# Patient Record
Sex: Female | Born: 1940 | Race: White | Hispanic: No | Marital: Married | Smoking: Never smoker
Health system: Southern US, Community
[De-identification: ages and names within clinical notes are randomized; demographics above are authoritative.]

## PROBLEM LIST (undated history)

## (undated) DIAGNOSIS — Z1371 Encounter for nonprocreative screening for genetic disease carrier status: Secondary | ICD-10-CM

## (undated) DIAGNOSIS — E7800 Pure hypercholesterolemia, unspecified: Secondary | ICD-10-CM

## (undated) DIAGNOSIS — I10 Essential (primary) hypertension: Secondary | ICD-10-CM

## (undated) DIAGNOSIS — I83893 Varicose veins of bilateral lower extremities with other complications: Secondary | ICD-10-CM

## (undated) DIAGNOSIS — T884XXA Failed or difficult intubation, initial encounter: Secondary | ICD-10-CM

## (undated) DIAGNOSIS — K449 Diaphragmatic hernia without obstruction or gangrene: Secondary | ICD-10-CM

## (undated) DIAGNOSIS — C50919 Malignant neoplasm of unspecified site of unspecified female breast: Secondary | ICD-10-CM

## (undated) DIAGNOSIS — F419 Anxiety disorder, unspecified: Secondary | ICD-10-CM

## (undated) DIAGNOSIS — R011 Cardiac murmur, unspecified: Secondary | ICD-10-CM

## (undated) DIAGNOSIS — D649 Anemia, unspecified: Secondary | ICD-10-CM

## (undated) DIAGNOSIS — Z923 Personal history of irradiation: Secondary | ICD-10-CM

## (undated) DIAGNOSIS — H269 Unspecified cataract: Secondary | ICD-10-CM

## (undated) DIAGNOSIS — D0511 Intraductal carcinoma in situ of right breast: Secondary | ICD-10-CM

## (undated) DIAGNOSIS — L719 Rosacea, unspecified: Secondary | ICD-10-CM

## (undated) DIAGNOSIS — K219 Gastro-esophageal reflux disease without esophagitis: Secondary | ICD-10-CM

## (undated) DIAGNOSIS — M1990 Unspecified osteoarthritis, unspecified site: Secondary | ICD-10-CM

## (undated) HISTORY — PX: CHOLECYSTECTOMY: SHX55

## (undated) HISTORY — DX: Pure hypercholesterolemia, unspecified: E78.00

## (undated) HISTORY — DX: Essential (primary) hypertension: I10

## (undated) HISTORY — DX: Unspecified osteoarthritis, unspecified site: M19.90

## (undated) HISTORY — PX: BLEPHAROPLASTY: SUR158

## (undated) HISTORY — PX: ABDOMINAL HYSTERECTOMY: SHX81

## (undated) HISTORY — DX: Anemia, unspecified: D64.9

## (undated) HISTORY — DX: Varicose veins of bilateral lower extremities with other complications: I83.893

## (undated) HISTORY — DX: Rosacea, unspecified: L71.9

## (undated) HISTORY — DX: Cardiac murmur, unspecified: R01.1

## (undated) HISTORY — PX: HERNIA REPAIR: SHX51

## (undated) HISTORY — PX: OTHER SURGICAL HISTORY: SHX169

## (undated) HISTORY — DX: Diaphragmatic hernia without obstruction or gangrene: K44.9

## (undated) HISTORY — DX: Unspecified cataract: H26.9

## (undated) HISTORY — PX: CATARACT EXTRACTION W/ INTRAOCULAR LENS  IMPLANT, BILATERAL: SHX1307

## (undated) HISTORY — DX: Anxiety disorder, unspecified: F41.9

## (undated) HISTORY — DX: Gastro-esophageal reflux disease without esophagitis: K21.9

## (undated) HISTORY — PX: BREAST LUMPECTOMY: SHX2

---

## 1999-09-20 DIAGNOSIS — R011 Cardiac murmur, unspecified: Secondary | ICD-10-CM

## 1999-09-20 HISTORY — DX: Cardiac murmur, unspecified: R01.1

## 2000-09-19 DIAGNOSIS — Z923 Personal history of irradiation: Secondary | ICD-10-CM

## 2000-09-19 DIAGNOSIS — C50919 Malignant neoplasm of unspecified site of unspecified female breast: Secondary | ICD-10-CM | POA: Insufficient documentation

## 2000-09-19 HISTORY — DX: Personal history of irradiation: Z92.3

## 2000-09-19 HISTORY — PX: BREAST LUMPECTOMY: SHX2

## 2000-09-19 HISTORY — PX: BREAST EXCISIONAL BIOPSY: SUR124

## 2000-09-19 HISTORY — PX: BREAST SURGERY: SHX581

## 2001-06-21 ENCOUNTER — Other Ambulatory Visit: Admission: RE | Admit: 2001-06-21 | Discharge: 2001-06-21 | Payer: Self-pay | Admitting: Family Medicine

## 2001-09-19 HISTORY — PX: BREAST CYST ASPIRATION: SHX578

## 2004-11-22 ENCOUNTER — Ambulatory Visit: Payer: Self-pay | Admitting: Oncology

## 2005-04-26 ENCOUNTER — Ambulatory Visit: Payer: Self-pay | Admitting: General Surgery

## 2005-06-06 ENCOUNTER — Ambulatory Visit: Payer: Self-pay | Admitting: Oncology

## 2005-11-23 ENCOUNTER — Ambulatory Visit: Payer: Self-pay | Admitting: Oncology

## 2006-04-20 ENCOUNTER — Ambulatory Visit: Payer: Self-pay | Admitting: General Surgery

## 2006-04-25 ENCOUNTER — Ambulatory Visit: Payer: Self-pay | Admitting: General Surgery

## 2006-06-05 ENCOUNTER — Ambulatory Visit: Payer: Self-pay | Admitting: Oncology

## 2006-09-19 HISTORY — PX: OTHER SURGICAL HISTORY: SHX169

## 2007-04-23 ENCOUNTER — Ambulatory Visit: Payer: Self-pay | Admitting: General Surgery

## 2008-04-23 ENCOUNTER — Ambulatory Visit: Payer: Self-pay | Admitting: General Surgery

## 2008-09-19 HISTORY — PX: OTHER SURGICAL HISTORY: SHX169

## 2009-04-27 ENCOUNTER — Ambulatory Visit: Payer: Self-pay | Admitting: General Surgery

## 2010-04-28 ENCOUNTER — Ambulatory Visit: Payer: Self-pay | Admitting: General Surgery

## 2011-05-25 ENCOUNTER — Ambulatory Visit: Payer: Self-pay | Admitting: General Surgery

## 2011-06-17 DIAGNOSIS — R197 Diarrhea, unspecified: Secondary | ICD-10-CM | POA: Insufficient documentation

## 2011-06-17 DIAGNOSIS — K30 Functional dyspepsia: Secondary | ICD-10-CM | POA: Insufficient documentation

## 2011-06-17 DIAGNOSIS — M109 Gout, unspecified: Secondary | ICD-10-CM | POA: Insufficient documentation

## 2011-06-17 DIAGNOSIS — R0989 Other specified symptoms and signs involving the circulatory and respiratory systems: Secondary | ICD-10-CM | POA: Insufficient documentation

## 2011-06-17 DIAGNOSIS — M1990 Unspecified osteoarthritis, unspecified site: Secondary | ICD-10-CM | POA: Insufficient documentation

## 2011-09-20 HISTORY — PX: COLONOSCOPY: SHX174

## 2012-04-05 ENCOUNTER — Ambulatory Visit: Payer: Self-pay | Admitting: Family Medicine

## 2012-04-24 DIAGNOSIS — R7303 Prediabetes: Secondary | ICD-10-CM | POA: Insufficient documentation

## 2012-06-11 ENCOUNTER — Ambulatory Visit: Payer: Self-pay | Admitting: General Surgery

## 2012-07-25 ENCOUNTER — Ambulatory Visit: Payer: Self-pay | Admitting: General Surgery

## 2012-09-19 HISTORY — PX: EYE SURGERY: SHX253

## 2012-10-23 ENCOUNTER — Ambulatory Visit: Payer: Self-pay | Admitting: Ophthalmology

## 2012-10-29 ENCOUNTER — Ambulatory Visit: Payer: Self-pay | Admitting: Ophthalmology

## 2013-02-14 ENCOUNTER — Encounter: Payer: Self-pay | Admitting: *Deleted

## 2013-06-19 ENCOUNTER — Ambulatory Visit: Payer: Self-pay | Admitting: General Surgery

## 2013-07-16 ENCOUNTER — Ambulatory Visit: Payer: Self-pay | Admitting: General Surgery

## 2013-07-25 ENCOUNTER — Ambulatory Visit (INDEPENDENT_AMBULATORY_CARE_PROVIDER_SITE_OTHER): Payer: Medicare Other | Admitting: General Surgery

## 2013-07-25 ENCOUNTER — Encounter: Payer: Self-pay | Admitting: General Surgery

## 2013-07-25 VITALS — BP 130/74 | HR 70 | Resp 14 | Ht 64.0 in | Wt 193.0 lb

## 2013-07-25 DIAGNOSIS — Z853 Personal history of malignant neoplasm of breast: Secondary | ICD-10-CM

## 2013-07-25 NOTE — Progress Notes (Signed)
Patient ID: Claire Kane, female   DOB: [birth year], 72 y.o.   MRN: [id number]  Chief Complaint  Patient presents with  . Follow-up    mammogram    HPI Claire Kane is a 72 y.o. female who presents for a breast evaluation. The most recent mammogram was done on 06/2013. Patient does perform regular self breast checks and gets regular mammograms done.    HPI  Past Medical History  Diagnosis Date  . Heart murmur 2001  . Arthritis   . 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 complications   . High cholesterol   . Rosacea     Past Surgical History  Procedure Laterality Date  . Stab pheblectomy   2010  . Vein closure procedure Bilateral 2008  . Abdominal hysterectomy    . Cholecystectomy    . Breast surgery Right 2002    lumpectomy  . Eye surgery Right 2014  . Colonoscopy  2013    History reviewed. No pertinent family history.  Social History History  Substance Use Topics  . Smoking status: Never Smoker   . Smokeless tobacco: Never Used  . Alcohol Use: Yes    No Known Allergies  Current Outpatient Prescriptions  Medication Sig Dispense Refill  . aspirin 81 MG tablet Take 81 mg by mouth daily.      Marland Kitchen atorvastatin (LIPITOR) 10 MG tablet Take 10 mg by mouth daily.      . calcium citrate (CALCITRATE - DOSED IN MG ELEMENTAL CALCIUM) 950 MG tablet Take 200 mg of elemental calcium by mouth daily.      Marland Kitchen ezetimibe (ZETIA) 10 MG tablet Take 10 mg by mouth daily.      Marland Kitchen glucosamine-chondroitin 500-400 MG tablet Take 1 tablet by mouth 3 (three) times daily.      . Multiple Vitamins-Minerals (MULTIVITAMIN WITH MINERALS) tablet Take 1 tablet by mouth daily.      . niacin 500 MG tablet Take 500 mg by mouth at bedtime.      . psyllium (REGULOID) 0.52 G capsule Take 0.52 g by mouth daily.      Lilian Kapur 625 MG tablet Take 1 tablet by mouth daily.       No current facility-administered medications for this visit.    Review of Systems Review of Systems  Constitutional:  Negative.   Respiratory: Negative.   Cardiovascular: Negative.     Blood pressure 130/74, pulse 70, resp. rate 14, height 5\' 4"  (1.626 m), weight 193 lb (87.544 kg).  Physical Exam Physical Exam  Constitutional: She is oriented to person, place, and time. She appears well-developed and well-nourished.  Eyes: Conjunctivae are normal. No scleral icterus.  Neck: No mass and no thyromegaly present.  Cardiovascular: Normal rate, normal heart sounds, intact distal pulses and normal pulses.   Pulses:      Dorsalis pedis pulses are 2+ on the right side, and 2+ on the left side.       Posterior tibial pulses are 2+ on the right side, and 2+ on the left side.  No edema No visible VV. Multi spider veins around the ankles. No skin changes.  Pulmonary/Chest: Breath sounds normal.  Abdominal: Soft. Normal appearance and bowel sounds are normal. There is no hepatomegaly. There is no tenderness. No hernia.  Lymphadenopathy:    She has no cervical adenopathy.    She has no axillary adenopathy.  Neurological: She is alert and oriented to person, place, and time. She has normal strength. No sensory deficit.  Skin: Skin is warm and dry.    Data Reviewed Mammogram reviewed    Assessment    Stable exam. 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  Also completed 5 years of of tamoxifen after     Plan    Patient to return in one year with bilateral screening mammogram        SANKAR,SEEPLAPUTHUR G 07/25/2013, 10:42 AM

## 2013-07-25 NOTE — Patient Instructions (Addendum)
Patient tio return in one year with bilateral screening mammogram

## 2013-08-06 ENCOUNTER — Encounter: Payer: Self-pay | Admitting: General Surgery

## 2013-09-05 DIAGNOSIS — E7801 Familial hypercholesterolemia: Secondary | ICD-10-CM | POA: Insufficient documentation

## 2013-09-05 DIAGNOSIS — R011 Cardiac murmur, unspecified: Secondary | ICD-10-CM | POA: Insufficient documentation

## 2013-09-05 DIAGNOSIS — E78019 Familial hypercholesterolemia, unspecified: Secondary | ICD-10-CM | POA: Insufficient documentation

## 2014-04-14 LAB — LIPID PANEL
Cholesterol: 179 mg/dL (ref 0–200)
HDL: 63 mg/dL (ref 35–70)
LDL CALC: 100 mg/dL
LDL/HDL RATIO: 1.6
TRIGLYCERIDES: 79 mg/dL (ref 40–160)

## 2014-04-14 LAB — BASIC METABOLIC PANEL: GLUCOSE: 98 mg/dL

## 2014-06-16 ENCOUNTER — Encounter: Payer: Self-pay | Admitting: General Surgery

## 2014-07-21 ENCOUNTER — Encounter: Payer: Self-pay | Admitting: General Surgery

## 2014-07-23 ENCOUNTER — Ambulatory Visit: Payer: Self-pay | Admitting: General Surgery

## 2014-07-23 ENCOUNTER — Encounter: Payer: Self-pay | Admitting: General Surgery

## 2014-07-30 ENCOUNTER — Encounter: Payer: Self-pay | Admitting: General Surgery

## 2014-07-30 ENCOUNTER — Ambulatory Visit (INDEPENDENT_AMBULATORY_CARE_PROVIDER_SITE_OTHER): Payer: Medicare Other | Admitting: General Surgery

## 2014-07-30 DIAGNOSIS — Z86000 Personal history of in-situ neoplasm of breast: Secondary | ICD-10-CM

## 2014-07-30 DIAGNOSIS — Z853 Personal history of malignant neoplasm of breast: Secondary | ICD-10-CM

## 2014-07-30 NOTE — Patient Instructions (Signed)
Patient to return in 1 year with bilateral screening mammogram. Continue self breast exams. Call office for any new breast issues or concerns.  

## 2014-07-30 NOTE — Progress Notes (Signed)
Patient ID: Claire Kane, female   DOB: Oct 05, 1940, 73 y.o.   MRN: [id number]  Chief Complaint  Patient presents with  . Follow-up    1 year mammogram    HPI Claire Kane is a 72 y.o. female who presents for a breast cancer follow up. The most recent mammogram was done on 07/23/14. Patient does perform regular self breast checks and gets regular mammograms done. The patient denies any new problems at this time.     HPI  Past Medical History  Diagnosis Date  . Heart murmur 2001  . Arthritis   . 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 complications   . High cholesterol   . Rosacea   . 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 Stab pheblectomy   2010  . Vein closure procedure Bilateral 2008  . Abdominal hysterectomy    . Cholecystectomy    . Breast surgery Right 2002    lumpectomy  . Eye surgery Right 2014  . Colonoscopy  2013    No family history on file.  Social History History  Substance Use Topics  . Smoking status: Never Smoker   . Smokeless tobacco: Never Used  . Alcohol Use: Yes    No Known Allergies  Current Outpatient Prescriptions  Medication Sig Dispense Refill  . aspirin 81 MG tablet Take 81 mg by mouth daily.    Marland Kitchen atorvastatin (LIPITOR) 10 MG tablet Take 10 mg by mouth daily.    . Azelaic Acid (FINACEA) 15 % cream Apply 1 application topically 2 (two) times daily.    . calcium citrate (CALCITRATE - DOSED IN MG ELEMENTAL CALCIUM) 950 MG tablet Take 200 mg of elemental calcium by mouth daily.    Marland Kitchen ezetimibe (ZETIA) 10 MG tablet Take 10 mg by mouth daily.    Marland Kitchen glucosamine-chondroitin 500-400 MG tablet Take 1 tablet by mouth 3 (three) times daily.    . Multiple Vitamins-Minerals (MULTIVITAMIN WITH MINERALS) tablet Take 1 tablet by mouth daily.    . niacin 500 MG tablet Take 500 mg by mouth at bedtime.    . psyllium (REGULOID) 0.52 G capsule Take 0.52 g by mouth daily.    [name] 625 MG tablet Take  1 tablet by mouth daily.     No current facility-administered medications for this visit.    Review of Systems Review of Systems  Constitutional: Negative.   Respiratory: Negative.   Cardiovascular: Negative.     Blood pressure 128/68, pulse 70, resp. rate 14, weight 180 lb (81.647 kg).  Physical Exam Physical Exam  Constitutional: She is oriented to person, place, and time. She appears well-developed and well-nourished.  Eyes: Conjunctivae are normal. No scleral icterus.  Neck: Neck supple. No thyromegaly present.  Cardiovascular: Normal rate, regular rhythm and normal heart sounds.   No murmur heard. Pulmonary/Chest: Effort normal and breath sounds normal. Right breast exhibits no inverted nipple, no mass, no nipple discharge, no skin change and no tenderness. Left breast exhibits no inverted nipple, no mass, no nipple discharge, no skin change and no tenderness.  Abdominal: Soft. Normal appearance and bowel sounds are normal. There is no hepatosplenomegaly. There is no tenderness. No hernia.  Lymphadenopathy:    She has no cervical adenopathy.    She has no axillary adenopathy.  Neurological: She is alert and oriented to person, place, and time.  Skin: Skin is warm and dry.    Data  Reviewed  Mammogram reviewed and stable.   Assessment    Stable exam. History of DCIS right breast.     Plan    Patient to return in 1 year with bilateral screening mammogram.        Criss Alvine F 07/30/2014, 9:22 AM

## 2014-09-16 ENCOUNTER — Ambulatory Visit: Payer: Self-pay | Admitting: Ophthalmology

## 2014-09-16 DIAGNOSIS — I499 Cardiac arrhythmia, unspecified: Secondary | ICD-10-CM

## 2014-09-29 ENCOUNTER — Ambulatory Visit: Payer: Self-pay | Admitting: Ophthalmology

## 2015-01-09 NOTE — Op Note (Signed)
PATIENT NAME:  Claire Kane, DICKE MR#:  [id number] DATE OF BIRTH:  August 22, 1941  DATE OF PROCEDURE:  10/29/2012  PREOPERATIVE DIAGNOSIS: Cataract, right eye.   POSTOPERATIVE DIAGNOSIS: Cataract, right eye.   PROCEDURE PERFORMED: Extracapsular cataract extraction using phacoemulsification with placement of Alcon SN6CWS, 23.5 diopter posterior chamber lens, serial number [id number]  SURGEON: Remo Lipps A. Argie Lober, M.D.   ANESTHESIA: 4% lidocaine and 0.75% Marcaine a 50-50 mixture with 10 units/mL of Hyalex added, given as a peribulbar.   ANESTHESIOLOGIST:  Dr. Benjamine Mola.   COMPLICATIONS: None.   ESTIMATED BLOOD LOSS: Less than 1 mL.   DESCRIPTION OF PROCEDURE:  The patient was brought to the operating room and given a peribulbar block.  The patient was then prepped and draped in the usual fashion.  The vertical rectus muscles were imbricated using 5-0 silk sutures.  These sutures were then clamped to the sterile drapes as bridle sutures.  A limbal peritomy was performed extending two clock hours and hemostasis was obtained with cautery.  A partial thickness scleral groove was made at the surgical limbus and dissected anteriorly in a lamellar dissection using an Alcon crescent knife.  The anterior chamber was entered superonasally with a Superblade and through the lamellar dissection with a 2.6 mm keratome.  DisCoVisc was used to replace the aqueous and a continuous tear capsulorrhexis was carried out.  Hydrodissection and hydrodelineation were carried out with balanced salt and a 27 gauge canula.  The nucleus was rotated to confirm the effectiveness of the hydrodissection.  Phacoemulsification was carried out using a divide-and-conquer technique.  Total ultrasound time was 1 minute and 46 seconds with an average power of 25.3 percent.  CDE 52.05.  Irrigation/aspiration was used to remove the residual cortex.  DisCoVisc was used to inflate the capsule and the internal incision was enlarged to 3 mm with  the crescent knife.  The intraocular lens was folded and inserted into the capsular bag using the AcrySert delivery system.  Irrigation/aspiration was used to remove the residual DisCoVisc.  Miostat was injected into the anterior chamber through the paracentesis track to inflate the anterior chamber and induce miosis.  A 0.10 mL of cefuroxime was injected into the anterior chamber via the paracentesis tract.   The wound was checked for leaks and none were found. The conjunctiva was closed with cautery and the bridle sutures were removed.  Two drops of 0.3% Vigamox were placed on the eye.   An eye shield was placed on the eye.  The patient was discharged to the recovery room in good condition.  ____________________________ Loura Back Amylee Lodato, MD sad:ct D: 10/29/2012 13:08:20 ET T: 10/29/2012 13:27:00 ET JOB#: [id number]  cc: Remo Lipps A. Suda Forbess, MD, <Dictator> Martie Lee MD ELECTRONICALLY SIGNED 11/19/2012 13:34

## 2015-01-18 NOTE — Op Note (Signed)
PATIENT NAME:  Claire Kane, Claire Kane MR#:  [id number] DATE OF BIRTH:  07/28/1941  DATE OF PROCEDURE:  09/29/2014  PREOPERATIVE DIAGNOSIS: Nuclear sclerotic cataract left eye.   POSTOPERATIVE DIAGNOSIS:  Nuclear sclerotic cataract left eye.   PROCEDURE PERFORMED: Extracapsular cataract extraction using phacoemulsification with placement of an Alcon SN6CWS, 23.5-diopter posterior chamber lens, serial number [id number].   ANESTHESIA: 4% lidocaine and 0.75% Marcaine in a 50/50 mixture with 10 units/mL of Hylenex added, given as a peribulbar.   ANESTHESIOLOGIST: Dr. Andree Elk.   COMPLICATIONS: None.   ESTIMATED BLOOD LOSS: Less than 1 mL.   DESCRIPTION OF PROCEDURE:  The patient was brought to the operating room and given a peribulbar block.  The patient was then prepped and draped in the usual fashion.  The vertical rectus muscles were imbricated using 5-0 silk sutures.  These sutures were then clamped to the sterile drapes as bridle sutures.  A limbal peritomy was performed extending two clock hours and hemostasis was obtained with cautery.  A partial thickness scleral groove was made at the surgical limbus and dissected anteriorly in a lamellar dissection using an Alcon crescent knife.  The anterior chamber was entered supero-temporally with a Superblade and through the lamellar dissection with a 2.6 mm keratome.  DisCoVisc was used to replace the aqueous and a continuous tear capsulorrhexis was carried out.  Hydrodissection and hydrodelineation were carried out with balanced salt and a 27 gauge canula.  The nucleus was rotated to confirm the effectiveness of the hydrodissection.  Phacoemulsification was carried out using a divide-and-conquer technique.  Total ultrasound time was 1 minute and 28 seconds with an average power of 26.4 percent. CDE of 40.37.  Irrigation/aspiration was used to remove the residual cortex.  DisCoVisc was used to inflate the capsule and the internal incision was enlarged to 3 mm  with the crescent knife.  The intraocular lens was folded and inserted into the capsular bag using the AcrySert delivery system.  Irrigation/aspiration was used to remove the residual DisCoVisc.  Miostat was injected into the anterior chamber through the paracentesis track to inflate the anterior chamber and induce miosis.  0.1 mL of cefuroxime containing 1 mg of drug was injected via the paracentesis track.  The wound was checked for leaks and none were found. The conjunctiva was closed with cautery and the bridle sutures were removed.  Two drops of 0.3% Vigamox were placed on the eye.   An eye shield was placed on the eye.  The patient was discharged to the recovery room in good condition.   ____________________________ Loura Back Tai Skelly, MD sad:bu D: 09/29/2014 13:26:50 ET T: 09/29/2014 14:15:26 ET JOB#: [id number]  cc: Remo Lipps A. Kuzey Ogata, MD, <Dictator> Martie Lee MD ELECTRONICALLY SIGNED 10/01/2014 16:21

## 2015-04-28 ENCOUNTER — Encounter: Payer: Self-pay | Admitting: Family Medicine

## 2015-05-28 ENCOUNTER — Encounter: Payer: Self-pay | Admitting: Family Medicine

## 2015-05-28 ENCOUNTER — Ambulatory Visit (INDEPENDENT_AMBULATORY_CARE_PROVIDER_SITE_OTHER): Payer: Medicare Other | Admitting: Family Medicine

## 2015-05-28 VITALS — BP 118/72 | HR 82 | Temp 98.2°F | Resp 14 | Ht 63.0 in | Wt 193.0 lb

## 2015-05-28 DIAGNOSIS — Z1211 Encounter for screening for malignant neoplasm of colon: Secondary | ICD-10-CM

## 2015-05-28 DIAGNOSIS — Z23 Encounter for immunization: Secondary | ICD-10-CM

## 2015-05-28 DIAGNOSIS — K219 Gastro-esophageal reflux disease without esophagitis: Secondary | ICD-10-CM

## 2015-05-28 DIAGNOSIS — E785 Hyperlipidemia, unspecified: Secondary | ICD-10-CM

## 2015-05-28 DIAGNOSIS — M546 Pain in thoracic spine: Secondary | ICD-10-CM | POA: Diagnosis not present

## 2015-05-28 DIAGNOSIS — Z0000 Encounter for general adult medical examination without abnormal findings: Secondary | ICD-10-CM | POA: Diagnosis not present

## 2015-05-28 LAB — IFOBT (OCCULT BLOOD): IFOBT: NEGATIVE

## 2015-05-28 MED ORDER — CARISOPRODOL 350 MG PO TABS: 350.0000 mg | ORAL_TABLET | Freq: Three times a day (TID) | ORAL | Status: DC | PRN

## 2015-05-28 MED ORDER — RANITIDINE HCL 150 MG PO TABS: 150.0000 mg | ORAL_TABLET | Freq: Two times a day (BID) | ORAL | Status: DC

## 2015-05-28 NOTE — Progress Notes (Signed)
Patient ID: Claire Kane, female   DOB: 1940/09/22, 74 y.o.   MRN: [id number]  Visit Date: 05/28/2015  Today's Provider: Wilhemena Durie, MD   Chief Complaint  Patient presents with  . Medicare Wellness   Subjective:   Claire Kane is a 74 y.o. female who presents today for her Subsequent Annual Wellness Visit. She feels fairly well. She reports exercising none. She reports she is sleeping fairly well.  LAST: Colonoscopy 07/25/2012 normal  Pap smear-with Dr. Kenton Kingfisher within past year or 2.  EKG-01/18/10  BMD-04/05/12 normal  Up to date with all immunizations at this time.  Mammogram follows Dr. Jamal Collin and is due in November 2016 again per patient.   Review of Systems  Constitutional: Negative.   HENT: Negative.   Eyes: Negative.   Respiratory: Negative.   Cardiovascular: Negative.   Gastrointestinal: Positive for constipation.  Endocrine: Negative.   Genitourinary: Positive for enuresis.  Musculoskeletal: Positive for myalgias and back pain.  Skin: Positive for rash.  Allergic/Immunologic: Negative.   Neurological: Negative.   Hematological: Bruises/bleeds easily.  Psychiatric/Behavioral: Negative.     Patient Active Problem List   Diagnosis Date Noted  . History of breast cancer, DCIS right breast, 2002 07/25/2013    Social History   Social History  . Marital Status: Married    Spouse Name: N/A  . Number of Children: N/A  . Years of Education: N/A   Occupational History  . Not on file.   Social History Main Topics  . Smoking status: Never Smoker   . Smokeless tobacco: Never Used  . Alcohol Use: No  . Drug Use: No  . Sexual Activity: No   Other Topics Concern  . Not on file   Social History Narrative    Past Surgical History  Procedure Laterality Date  . Stab pheblectomy   2010  . Vein closure procedure Bilateral 2008  . Abdominal hysterectomy    . Cholecystectomy    . Breast surgery Right 2002    lumpectomy  . Eye surgery Right 2014  .  Colonoscopy  2013    Her family history includes Cancer (age of onset: 3) in her cousin and other; Cancer (age of onset: 97) in her cousin; Cancer (age of onset: 68) in her maternal aunt; Heart attack in her brother, father, mother, and sister; Other in her brother; Stroke in her sister.    Outpatient Prescriptions Prior to Visit  Medication Sig Dispense Refill  . aspirin 81 MG tablet Take 81 mg by mouth daily.    Marland Kitchen atorvastatin (LIPITOR) 10 MG tablet Take 10 mg by mouth daily.    . calcium citrate (CALCITRATE - DOSED IN MG ELEMENTAL CALCIUM) 950 MG tablet Take 200 mg of elemental calcium by mouth daily.    Marland Kitchen ezetimibe (ZETIA) 10 MG tablet Take 10 mg by mouth daily.    Marland Kitchen glucosamine-chondroitin 500-400 MG tablet Take 1 tablet by mouth 3 (three) times daily.    . Multiple Vitamins-Minerals (MULTIVITAMIN WITH MINERALS) tablet Take 1 tablet by mouth daily.    . psyllium (REGULOID) 0.52 G capsule Take 0.52 g by mouth daily.    [name] 625 MG tablet Take 1 tablet by mouth daily.    . Azelaic Acid (FINACEA) 15 % cream Apply 1 application topically 2 (two) times daily.    . niacin 500 MG tablet Take 500 mg by mouth at bedtime.     No facility-administered medications prior to visit.    No Known Allergies  Patient Care Team: Jerrol Banana., MD as PCP - General (Family Medicine) Seeplaputhur Robinette Haines, MD (General Surgery)  Objective:   Vitals:  Filed Vitals:   05/28/15 1035  BP: 118/72  Pulse: 82  Temp: 98.2 F (36.8 C)  Resp: 14  Height: 5\' 3"  (1.6 m)  Weight: 193 lb (87.544 kg)    Physical Exam  Constitutional: She is oriented to person, place, and time. She appears well-developed and well-nourished.  HENT:  Head: Normocephalic and atraumatic.  Right Ear: External ear normal.  Left Ear: External ear normal.  Nose: Nose normal.  Mouth/Throat: Oropharynx is clear and moist.  Eyes: Conjunctivae are normal. Pupils are equal, round, and reactive to light.  Neck: Neck  supple.  Cardiovascular: Normal rate, regular rhythm, normal heart sounds and intact distal pulses.   Spider varicose veins lower legs, ankles, and feet.  Pulmonary/Chest: Effort normal and breath sounds normal.  Abdominal: Soft. Bowel sounds are normal.  Genitourinary: Guaiac negative stool.  Pelvic exam deferred to GYN. DRE normal  Musculoskeletal: Normal range of motion.  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  Neurological: She is alert and oriented to person, place, and time.  Skin: Skin is warm and dry.  Psychiatric: She has a normal mood and affect. Her behavior is normal. Judgment and thought content normal.    Activities of Daily Living In your present state of health, do you have any difficulty performing the following activities: 05/28/2015  Hearing? N  Vision? N  Difficulty concentrating or making decisions? Y  Walking or climbing stairs? Y  Dressing or bathing? N  Doing errands, shopping? N    Fall Risk Assessment Fall Risk  05/28/2015  Falls in the past year? Yes  Number falls in past yr: 1  Injury with Fall? No     Depression Screen PHQ 2/9 Scores 05/28/2015  PHQ - 2 Score 0    Cognitive Testing - 6-CIT    Year: 0 4 points  Month: 0 3 points  Memorize "Pia Mau, [address], [place]"  Time (within 1 hour:) 0 3 points  Count backwards from 20: 0 2 4 points  Name months of year: 0 2 4 points  Repeat Address: 0 2 4 6 8 10  points   Total Score: 0/28  Interpretation : Normal (0-7) Abnormal (8-28)    Assessment & Plan:     Annual Wellness Visit  Reviewed patient's Family Medical History Reviewed and updated list of patient's medical providers Assessment of cognitive impairment was done Assessed patient's functional ability Established a written schedule for health screening [facility] Completed and Reviewed    1. Need for influenza vaccination  - Flu vaccine HIGH DOSE PF (Fluzone High dose)  2. Bilateral thoracic  back pain New. Try Soma. Follow as needed. - carisoprodol (SOMA) 350 MG tablet; Take 1 tablet (350 mg total) by mouth 3 (three) times daily as needed for muscle spasms.  Dispense: 55 tablet; Refill: 1  3. 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 Darrick Huntsman.  5. GERD Try ranitidine 150 mg twice a day. Return to clinic 2 months to reassess.  6. Breast cancer/DCIS-right breast-2002 Followed by Dr. Jamal Collin.  7. Post menopausal Pap smear per Dr. Kenton Kingfisher 8. Cystocele Pessary per Dr. Kenton Kingfisher. 9. Hyperlipidemia Check labs. Followed by Dr. Arther Dames at [facility].    I have done the exam and reviewed the above chart and it  is accurate to the best of my knowledge.  Patient was seen and examined by Dr. Eulas Post and note was scribed by Theressa Millard, RMA.    Miguel Aschoff MD [facility] 05/28/2015 10:37 AM

## 2015-06-12 ENCOUNTER — Other Ambulatory Visit: Payer: Self-pay

## 2015-06-12 ENCOUNTER — Encounter: Payer: Self-pay | Admitting: Family Medicine

## 2015-06-12 DIAGNOSIS — Z1231 Encounter for screening mammogram for malignant neoplasm of breast: Secondary | ICD-10-CM

## 2015-07-27 ENCOUNTER — Other Ambulatory Visit: Payer: Self-pay | Admitting: General Surgery

## 2015-07-27 ENCOUNTER — Ambulatory Visit
Admission: RE | Admit: 2015-07-27 | Discharge: 2015-07-27 | Disposition: A | Discharge status: Home / Self Care | Payer: Medicare Other | Source: Ambulatory Visit | Attending: General Surgery | Admitting: General Surgery

## 2015-07-27 DIAGNOSIS — Z1231 Encounter for screening mammogram for malignant neoplasm of breast: Secondary | ICD-10-CM

## 2015-08-04 ENCOUNTER — Ambulatory Visit (INDEPENDENT_AMBULATORY_CARE_PROVIDER_SITE_OTHER): Payer: Medicare Other | Admitting: General Surgery

## 2015-08-04 ENCOUNTER — Encounter: Payer: Self-pay | Admitting: General Surgery

## 2015-08-04 VITALS — BP 124/72 | HR 90 | Resp 14 | Ht 65.0 in | Wt 194.0 lb

## 2015-08-04 DIAGNOSIS — Z853 Personal history of malignant neoplasm of breast: Secondary | ICD-10-CM | POA: Diagnosis not present

## 2015-08-04 DIAGNOSIS — Z1371 Encounter for nonprocreative screening for genetic disease carrier status: Secondary | ICD-10-CM

## 2015-08-04 DIAGNOSIS — Z86000 Personal history of in-situ neoplasm of breast: Secondary | ICD-10-CM

## 2015-08-04 HISTORY — DX: Encounter for nonprocreative screening for genetic disease carrier status: Z13.71

## 2015-08-04 NOTE — Progress Notes (Addendum)
Patient ID: Claire Kane, female   DOB: 01/28/1941, 74 y.o.   MRN: [id number]  Chief Complaint  Patient presents with  . Follow-up    mammogram    HPI Claire Kane is a 74 y.o. female.  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 The most recent mammogram was done on 07-27-15.  Patient does perform regular self breast checks and gets regular mammograms done.   I have reviewed the history of present illness with the patient.  HPI  Past Medical History  Diagnosis Date  . Heart murmur 2001  . Arthritis   . 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 complications   . High cholesterol   . Rosacea   . Cancer [facility]) 2002    DCIS right breast     Past Surgical History  Procedure Laterality Date  . Stab pheblectomy   2010  . Vein closure procedure Bilateral 2008  . Abdominal hysterectomy    . Cholecystectomy    . Breast surgery Right 2002    lumpectomy  . Eye surgery Right 2014  . Colonoscopy  2013  . Breast excisional biopsy Right     positive 2002  . Breast cyst aspiration Right     2003  . Post radiation therapy Right     right breast cancer 2002    Family History  Problem Relation Age of Onset  . Breast cancer Cousin 29    breast/maternal  . Cancer Cousin 10    breast/maternal  . Breast cancer Maternal Aunt 70  . Heart attack Mother   . Heart attack Father   . Heart attack Sister   . Stroke Sister   . Heart attack Brother   . Other Brother     cerebral hemorrhage, sepsis  . Breast cancer Other 48    maternal neice    Social History Social History  Substance Use Topics  . Smoking status: Never Smoker   . Smokeless tobacco: Never Used  . Alcohol Use: No    No Known Allergies  Current Outpatient Prescriptions  Medication Sig Dispense Refill  . aspirin 81 MG tablet Take 81 mg by mouth daily.    Marland Kitchen atorvastatin (LIPITOR) 10 MG tablet Take 10 mg by mouth daily.    . calcium  citrate (CALCITRATE - DOSED IN MG ELEMENTAL CALCIUM) 950 MG tablet Take 200 mg of elemental calcium by mouth daily.    . carisoprodol (SOMA) 350 MG tablet Take 1 tablet (350 mg total) by mouth 3 (three) times daily as needed for muscle spasms. 55 tablet 1  . ezetimibe (ZETIA) 10 MG tablet Take 10 mg by mouth daily.    Marland Kitchen glucosamine-chondroitin 500-400 MG tablet Take 1 tablet by mouth 3 (three) times daily.    Marland Kitchen ibuprofen (ADVIL,MOTRIN) 200 MG tablet Take by mouth.    . Multiple Vitamins-Minerals (MULTIVITAMIN WITH MINERALS) tablet Take 1 tablet by mouth daily.    . Omega-3 Fatty Acids (FISH OIL BURP-LESS) 1000 MG CAPS Take by mouth.    . ORACEA 40 MG capsule     . psyllium (REGULOID) 0.52 G capsule Take 0.52 g by mouth daily.    . ranitidine (ZANTAC) 150 MG tablet Take 1 tablet (150 mg total) by mouth 2 (two) times daily. 60 tablet 12  . spironolactone (ALDACTONE) 50 MG tablet     . WELCHOL 625 MG tablet Take 1 tablet by mouth daily.  No current facility-administered medications for this visit.    Review of Systems Review of Systems  Constitutional: Negative.   Respiratory: Negative.   Cardiovascular: Negative.     Blood pressure 124/72, pulse 90, resp. rate 14, height 5\' 5"  (1.651 m), weight 194 lb (87.998 kg).  Physical Exam Physical Exam  Constitutional: She is oriented to person, place, and time. She appears well-developed and well-nourished.  HENT:  Head: Normocephalic.  Eyes: Conjunctivae are normal. No scleral icterus.  Neck: Neck supple.  Cardiovascular: Normal rate, regular rhythm and normal heart sounds.   Pulmonary/Chest: Effort normal and breath sounds normal. Right breast exhibits no inverted nipple, no mass, no nipple discharge, no skin change and no tenderness. Left breast exhibits no inverted nipple, no mass, no nipple discharge, no skin change and no tenderness.  Right breast lumpectomy site well healed   Abdominal: Soft. Bowel sounds are normal. There is no  hepatomegaly. There is no tenderness.  Lymphadenopathy:    She has no cervical adenopathy.    She has no axillary adenopathy.  Neurological: She is alert and oriented to person, place, and time.  Skin: Skin is warm and dry.    Data Reviewed Mammogram reviewed, no suspicious findings  Assessment    Stable exam, patient is 14 years post right breast lumpectomy and radiation for DCIS. Completed 5 years of tamoxifen.  FH of breast CA     Plan    The patient has been asked to return to the office in one year with a bilateral screening mammogram.     PCP:  Milagros Reap 08/04/2015, 10:34 AM

## 2015-08-04 NOTE — Patient Instructions (Addendum)
The patient is aware to call back for any questions or concerns.  The patient has been asked to return to the office in one year with a bilateral screening mammogram 

## 2015-08-06 ENCOUNTER — Ambulatory Visit: Payer: Medicare Other | Admitting: General Surgery

## 2015-08-17 ENCOUNTER — Ambulatory Visit: Payer: Medicare Other | Admitting: Family Medicine

## 2015-08-19 ENCOUNTER — Ambulatory Visit (INDEPENDENT_AMBULATORY_CARE_PROVIDER_SITE_OTHER): Payer: Medicare Other | Admitting: Family Medicine

## 2015-08-19 ENCOUNTER — Ambulatory Visit
Admission: RE | Admit: 2015-08-19 | Discharge: 2015-08-19 | Disposition: A | Discharge status: Home / Self Care | Payer: Medicare Other | Source: Ambulatory Visit | Attending: Family Medicine | Admitting: Family Medicine

## 2015-08-19 VITALS — BP 134/72 | HR 68 | Temp 98.0°F | Resp 16 | Wt 196.0 lb

## 2015-08-19 DIAGNOSIS — M1990 Unspecified osteoarthritis, unspecified site: Secondary | ICD-10-CM | POA: Diagnosis not present

## 2015-08-19 DIAGNOSIS — M5136 Other intervertebral disc degeneration, lumbar region: Secondary | ICD-10-CM | POA: Diagnosis not present

## 2015-08-19 DIAGNOSIS — M546 Pain in thoracic spine: Secondary | ICD-10-CM | POA: Diagnosis present

## 2015-08-19 DIAGNOSIS — I6529 Occlusion and stenosis of unspecified carotid artery: Secondary | ICD-10-CM | POA: Diagnosis not present

## 2015-08-19 DIAGNOSIS — F329 Major depressive disorder, single episode, unspecified: Secondary | ICD-10-CM | POA: Diagnosis not present

## 2015-08-19 DIAGNOSIS — K219 Gastro-esophageal reflux disease without esophagitis: Secondary | ICD-10-CM

## 2015-08-19 DIAGNOSIS — E669 Obesity, unspecified: Secondary | ICD-10-CM

## 2015-08-19 DIAGNOSIS — N6019 Diffuse cystic mastopathy of unspecified breast: Secondary | ICD-10-CM | POA: Diagnosis not present

## 2015-08-19 DIAGNOSIS — M5134 Other intervertebral disc degeneration, thoracic region: Secondary | ICD-10-CM | POA: Diagnosis not present

## 2015-08-19 DIAGNOSIS — E785 Hyperlipidemia, unspecified: Secondary | ICD-10-CM

## 2015-08-19 DIAGNOSIS — F32A Depression, unspecified: Secondary | ICD-10-CM

## 2015-08-19 MED ORDER — NAPROXEN 375 MG PO TABS: 375.0000 mg | ORAL_TABLET | Freq: Two times a day (BID) | ORAL | Status: DC

## 2015-08-19 NOTE — Progress Notes (Signed)
Patient ID: Claire Kane, female   DOB: 1940-11-30, 74 y.o.   MRN: [id number]   Claire Kane  MRN: [id number] DOB: Apr 27, 1941  Subjective:  HPI   1. Bilateral thoracic back pain Patient is a 74 year old female who presents for follow up of her back pain.  She was last seen on 05/28/15 and at that time she was started on Soma.  She states that she did not notice that the medication was helping until last week when she took an Aleve during the day and then later that day she took her Manuela Neptune and noticed that it did help.  She has not had to take both of the medication in the same day until yesterday and she took both.  She does feel that she is better today but is not sure if it is because of the medication or because she was getting better with time.  Patient has questions about how many and how long she can take the Carisoprodol and Aleve.  2. Gastroesophageal reflux disease, esophagitis presence not specified Patient was seen in September and was started on Ranitidine.  She states that while taking the medication she did notice improvement of her symptoms and only had 2-3 episodes since the last visit.  The patient has questions about the safety of taking the Ranitidine long term.     Patient Active Problem List   Diagnosis Date Noted  . Depression 08/19/2015  . Fibrocystic breast disease 08/19/2015  . Carotid stenosis 08/19/2015  . Osteoarthritis 08/19/2015  . Obesity 08/19/2015  . Hyperlipidemia 08/19/2015  . History of breast cancer, DCIS right breast, 2002 07/25/2013    Past Medical History  Diagnosis Date  . Heart murmur 2001  . Arthritis   . 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 complications   . High cholesterol   . Rosacea   . Cancer [facility]) 2002    DCIS right breast     Social History   Social History  . Marital Status: Married    Spouse Name: N/A  . Number of Children: N/A  . Years of Education: N/A   Occupational History  . Not on file.   Social History  Main Topics  . Smoking status: Never Smoker   . Smokeless tobacco: Never Used  . Alcohol Use: No  . Drug Use: No  . Sexual Activity: No   Other Topics Concern  . Not on file   Social History Narrative    Outpatient Prescriptions Prior to Visit  Medication Sig Dispense Refill  . aspirin 81 MG tablet Take 81 mg by mouth daily.    Marland Kitchen atorvastatin (LIPITOR) 10 MG tablet Take 10 mg by mouth daily.    . calcium citrate (CALCITRATE - DOSED IN MG ELEMENTAL CALCIUM) 950 MG tablet Take 200 mg of elemental calcium by mouth daily.    . carisoprodol (SOMA) 350 MG tablet Take 1 tablet (350 mg total) by mouth 3 (three) times daily as needed for muscle spasms. 55 tablet 1  . ezetimibe (ZETIA) 10 MG tablet Take 10 mg by mouth daily.    Marland Kitchen glucosamine-chondroitin 500-400 MG tablet Take 1 tablet by mouth 3 (three) times daily.    . Multiple Vitamins-Minerals (MULTIVITAMIN WITH MINERALS) tablet Take 1 tablet by mouth daily.    . Omega-3 Fatty Acids (FISH OIL BURP-LESS) 1000 MG CAPS Take by mouth.    . ORACEA 40 MG capsule     . psyllium (REGULOID) 0.52 G capsule Take 0.52 g  by mouth daily.    . ranitidine (ZANTAC) 150 MG tablet Take 1 tablet (150 mg total) by mouth 2 (two) times daily. 60 tablet 12  . spironolactone (ALDACTONE) 50 MG tablet     . WELCHOL 625 MG tablet Take 1 tablet by mouth daily.    Marland Kitchen ibuprofen (ADVIL,MOTRIN) 200 MG tablet Take by mouth.     No facility-administered medications prior to visit.    No Known Allergies  Review of Systems  Constitutional: Negative for fever, chills, malaise/fatigue and diaphoresis.  Respiratory: Positive for shortness of breath (not new or bothersome). Negative for cough and wheezing.   Cardiovascular: Negative for chest pain, palpitations, orthopnea and leg swelling.  Musculoskeletal: Positive for myalgias and back pain. Negative for joint pain, falls and neck pain.  Neurological: Negative for dizziness, weakness and headaches.   Objective:  BP  134/72 mmHg  Pulse 68  Temp(Src) 98 F (36.7 C) (Oral)  Resp 16  Wt 196 lb (88.905 kg)  Physical Exam  Constitutional: She is well-developed, well-nourished, and in no distress.  HENT:  Head: Normocephalic.  Neck: Normal range of motion. Neck supple.  Cardiovascular: Normal rate, regular rhythm and normal heart sounds.   Pulmonary/Chest: Effort normal and breath sounds normal.  Musculoskeletal: Normal range of motion.  Skin: Skin is warm and dry.  Psychiatric: Mood, memory, affect and judgment normal.    Assessment and Plan :   1. Bilateral thoracic back pain Appears to be musculoskeletal pain. - naproxen (NAPROSYN) 375 MG tablet; Take 1 tablet (375 mg total) by mouth 2 (two) times daily with a meal.  Dispense: 60 tablet; Refill: 5 - DG Thoracic Spine W/Swimmers; Future - DG Lumbar Spine Complete; Future  2. Gastroesophageal reflux disease, esophagitis presence not specified Patient is to continue with her Zantac. 3.h/o Breast Cancer DCIS 2002. 4.GERD Miguel Aschoff MD [facility] 08/19/2015 10:55 AM

## 2015-08-20 ENCOUNTER — Telehealth: Payer: Self-pay | Admitting: Family Medicine

## 2015-08-20 NOTE — Telephone Encounter (Signed)
With the high cholesterol it is not a surprise. See me this month to see if she wants to do anything further regarding this. Probably treating cholesterol is it for now--0would get ECG when she comes back.

## 2015-08-20 NOTE — Telephone Encounter (Signed)
Dr Rosanna Randy Patient was concerned because of seeing the sentence about atherosclerotic abdominal aorta.  I explained it to her as some 'hardening of the arteries' but probably not significant amount as he did not even mention it in his impression.  I told her I would double check with you and let her know.

## 2015-08-20 NOTE — Telephone Encounter (Signed)
Pt stated that after seeing her my chart she has questions about her X rays and would like a nurse to call her back. Thanks TNP

## 2015-08-24 NOTE — Telephone Encounter (Signed)
Pt advised. She states she goes to  [facility] for cholesterol and has appointment with them coming up. She will discuss this with them and see what needs to be done and will let us know if she wants to follow up on this with us-aa

## 2015-10-27 ENCOUNTER — Other Ambulatory Visit: Payer: Self-pay | Admitting: Family Medicine

## 2015-10-27 DIAGNOSIS — M546 Pain in thoracic spine: Secondary | ICD-10-CM

## 2015-10-27 NOTE — Telephone Encounter (Signed)
This is normally Dr. Alben Spittle patient and I have not seen her. Need appointment to evaluate situation and consider options.

## 2015-10-27 NOTE — Telephone Encounter (Signed)
Pt has been taking the soma for pain and spasms but she thinks it is hurting her stomach. She also said that she does not feel that it is helping her.   She wants to know if there something else she can take,  Her pharmacy is [facility] [address]  Her call back is  [phone]  [employer]

## 2015-10-27 NOTE — Telephone Encounter (Signed)
Please advise 

## 2015-10-28 MED ORDER — CYCLOBENZAPRINE HCL 10 MG PO TABS: 10.0000 mg | ORAL_TABLET | Freq: Three times a day (TID) | ORAL | Status: DC | PRN

## 2015-10-28 NOTE — Telephone Encounter (Signed)
Done and patient advised, appt made-aa

## 2015-10-28 NOTE — Telephone Encounter (Signed)
Try flexeril 10mg  TID prn,#90,no rf. See me next week.thx

## 2015-10-28 NOTE — Telephone Encounter (Signed)
Dr. Rosanna Randy can you advise on anything else for her to try or does she need an appointment?-aa

## 2015-11-04 ENCOUNTER — Encounter: Payer: Self-pay | Admitting: Family Medicine

## 2015-11-04 ENCOUNTER — Ambulatory Visit (INDEPENDENT_AMBULATORY_CARE_PROVIDER_SITE_OTHER): Payer: Medicare Other | Admitting: Family Medicine

## 2015-11-04 VITALS — BP 120/76 | HR 84 | Temp 98.1°F | Resp 16 | Wt 195.0 lb

## 2015-11-04 DIAGNOSIS — E785 Hyperlipidemia, unspecified: Secondary | ICD-10-CM | POA: Insufficient documentation

## 2015-11-04 DIAGNOSIS — N6019 Diffuse cystic mastopathy of unspecified breast: Secondary | ICD-10-CM | POA: Insufficient documentation

## 2015-11-04 DIAGNOSIS — F329 Major depressive disorder, single episode, unspecified: Secondary | ICD-10-CM | POA: Insufficient documentation

## 2015-11-04 DIAGNOSIS — R109 Unspecified abdominal pain: Secondary | ICD-10-CM

## 2015-11-04 DIAGNOSIS — R0602 Shortness of breath: Secondary | ICD-10-CM

## 2015-11-04 DIAGNOSIS — E669 Obesity, unspecified: Secondary | ICD-10-CM | POA: Insufficient documentation

## 2015-11-04 DIAGNOSIS — R0609 Other forms of dyspnea: Secondary | ICD-10-CM

## 2015-11-04 DIAGNOSIS — M546 Pain in thoracic spine: Secondary | ICD-10-CM | POA: Diagnosis not present

## 2015-11-04 DIAGNOSIS — K219 Gastro-esophageal reflux disease without esophagitis: Secondary | ICD-10-CM | POA: Insufficient documentation

## 2015-11-04 DIAGNOSIS — B354 Tinea corporis: Secondary | ICD-10-CM

## 2015-11-04 DIAGNOSIS — D649 Anemia, unspecified: Secondary | ICD-10-CM | POA: Diagnosis not present

## 2015-11-04 DIAGNOSIS — M1710 Unilateral primary osteoarthritis, unspecified knee: Secondary | ICD-10-CM | POA: Insufficient documentation

## 2015-11-04 DIAGNOSIS — M179 Osteoarthritis of knee, unspecified: Secondary | ICD-10-CM | POA: Insufficient documentation

## 2015-11-04 DIAGNOSIS — R002 Palpitations: Secondary | ICD-10-CM

## 2015-11-04 DIAGNOSIS — F32A Depression, unspecified: Secondary | ICD-10-CM | POA: Insufficient documentation

## 2015-11-04 DIAGNOSIS — H269 Unspecified cataract: Secondary | ICD-10-CM | POA: Insufficient documentation

## 2015-11-04 DIAGNOSIS — R7611 Nonspecific reaction to tuberculin skin test without active tuberculosis: Secondary | ICD-10-CM | POA: Insufficient documentation

## 2015-11-04 MED ORDER — CLOTRIMAZOLE-BETAMETHASONE 1-0.05 % EX CREA: 1.0000 "application " | TOPICAL_CREAM | Freq: Two times a day (BID) | CUTANEOUS | Status: DC

## 2015-11-04 NOTE — Progress Notes (Signed)
Patient ID: Claire Kane, female   DOB: [birth year], 74 y.o.   MRN: [id number]    Subjective:  HPI Pt is here for a follow up of back pain. She is taking flexeril but it only really helps for the muscle spasm and not the soreness and aching. She reports that the pain is better today but has not been until today. She also reports that she has been having SOB and pain her rib cage on both sides from time to time. She has shortness of breath with any little bit of exertion. She reports that she can hear her pulse beating in her left ear and she has felt like her heart has been beating fast a couple of time. She does not know if this is from sitting around more and not doing as much then she gets up to do these things she has these symptoms.   Prior to Admission medications   Medication Sig Start Date End Date Taking? Authorizing Provider  aspirin 81 MG tablet Take 81 mg by mouth daily.   Yes Historical Provider, MD  atorvastatin (LIPITOR) 10 MG tablet Take 10 mg by mouth daily.   Yes Historical Provider, MD  calcium citrate (CALCITRATE - DOSED IN MG ELEMENTAL CALCIUM) 950 MG tablet Take 200 mg of elemental calcium by mouth daily.   Yes Historical Provider, MD  cyclobenzaprine (FLEXERIL) 10 MG tablet Take 1 tablet (10 mg total) by mouth 3 (three) times daily as needed for muscle spasms. 10/28/15  Yes Richard Maceo Pro., MD  ezetimibe (ZETIA) 10 MG tablet Take 10 mg by mouth daily.   Yes Historical Provider, MD  glucosamine-chondroitin 500-400 MG tablet Take 1 tablet by mouth 3 (three) times daily.   Yes Historical Provider, MD  ibuprofen (ADVIL,MOTRIN) 200 MG tablet Take by mouth.   Yes Historical Provider, MD  Multiple Vitamins-Minerals (MULTIVITAMIN WITH MINERALS) tablet Take 1 tablet by mouth daily.   Yes Historical Provider, MD  Omega-3 Fatty Acids (FISH OIL BURP-LESS) 1000 MG CAPS Take by mouth.   Yes Historical Provider, MD  ORACEA 40 MG capsule  03/02/15  Yes Historical Provider, MD  psyllium  (REGULOID) 0.52 G capsule Take 0.52 g by mouth daily.   Yes Historical Provider, MD  ranitidine (ZANTAC) 150 MG tablet Take 1 tablet (150 mg total) by mouth 2 (two) times daily. 05/28/15  Yes Richard Maceo Pro., MD  spironolactone (ALDACTONE) 50 MG tablet  12/29/14  Yes Historical Provider, MD  WELCHOL 625 MG tablet Take 1 tablet by mouth daily. 05/21/13  Yes Historical Provider, MD    Patient Active Problem List   Diagnosis Date Noted  . Cataract 11/04/2015  . Clinical depression 11/04/2015  . Dyslipidemia 11/04/2015  . 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 Acid reflux 11/04/2015  . Adiposity 11/04/2015  . 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 Depression 08/19/2015  . Fibrocystic breast disease 08/19/2015  . Carotid stenosis 08/19/2015  . Osteoarthritis 08/19/2015  . Obesity 08/19/2015  . Hyperlipidemia 08/19/2015  . 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 Heart murmur, systolic [id number]  . 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 Other specified symptoms and signs involving the circulatory and respiratory systems 06/17/2011  . Acid indigestion 06/17/2011  . Gout 06/17/2011    Past Medical History  Diagnosis Date  . Heart murmur 2001  . Arthritis   . 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 complications   .  High cholesterol   . Rosacea   . Cancer [facility]) 2002    DCIS right breast     Social History   Social History  . Marital Status: Married    Spouse Name: N/A  . Number of Children: N/A  . Years of Education: N/A   Occupational History  . Not on file.   Social History Main Topics  . Smoking status: Never Smoker   . Smokeless tobacco: Never Used  . Alcohol Use: No  . Drug Use: No  . Sexual Activity: No   Other Topics Concern  . Not on file   Social History Narrative    No Known Allergies  Review of Systems  Constitutional:  Negative.   HENT: Negative.   Eyes: Negative.   Respiratory: Positive for shortness of breath.   Cardiovascular: Positive for palpitations.  Gastrointestinal: Negative.   Genitourinary: Negative.   Musculoskeletal: Positive for back pain.  Skin: Negative.   Neurological: Positive for dizziness (she thinks it is from her muscle relaxer.).  Endo/Heme/Allergies: Negative.   Psychiatric/Behavioral: Negative.     Immunization History  Administered Date(s) Administered  . Influenza, High Dose Seasonal PF 05/28/2015  . Pneumococcal Conjugate-13 04/22/2014  . Pneumococcal Polysaccharide-23 03/15/2011  . Td 09/06/2007  . Tdap 04/22/2014  . Zoster 06/27/2011   Objective:  BP 120/76 mmHg  Pulse 84  Temp(Src) 98.1 F (36.7 C) (Oral)  Resp 16  Wt 195 lb (88.451 kg)  Physical Exam  Constitutional: She is oriented to person, place, and time and well-developed, well-nourished, and in no distress.  Eyes: Conjunctivae and EOM are normal. Pupils are equal, round, and reactive to light.  Possibly pale conjunctiva.  Neck: Normal range of motion. Neck supple.  Cardiovascular: Normal rate, regular rhythm and intact distal pulses.   Murmur (2/6 systolic murmur ar the right upper sternal border.) heard. Pulmonary/Chest: Effort normal and breath sounds normal.  Abdominal: Soft. Bowel sounds are normal.  Musculoskeletal: Normal range of motion.  Neurological: She is alert and oriented to person, place, and time. She has normal reflexes. Gait normal. GCS score is 15.  Skin: Skin is warm and dry.  Psychiatric: Mood, memory, affect and judgment normal.    No results found for: WBC, HGB, HCT, PLT, GLUCOSE, CHOL, TRIG, HDL, LDLDIRECT, LDLCALC, TSH, PSA, INR, GLUF, HGBA1C, MICROALBUR  CMP  No results found for: NA, K, CL, CO2, GLUCOSE, BUN, CREATININE, CALCIUM, PROT, ALBUMIN, AST, ALT, ALKPHOS, BILITOT, GFRNONAA, GFRAA  Assessment and Plan :  1. Shortness of breath  - EKG 12-Lead - CBC with  Differential/Platelet - TSH - Ambulatory referral to Cardiology  2. Bilateral thoracic back pain  - Ambulatory referral to Physical Therapy  3. Abdominal discomfort  - Comprehensive metabolic panel - Lipase  4. Hyperlipidemia  5. Dyspnea on exertion  - Ambulatory referral to Cardiology  6. Awareness of heartbeats   Patient was seen and examined by Dr. Miguel Aschoff, and noted scribed by Webb Laws, Richton MD [facility] 11/04/2015 11:16 AM

## 2015-11-05 ENCOUNTER — Ambulatory Visit: Payer: Medicare Other | Attending: Family Medicine

## 2015-11-05 DIAGNOSIS — M546 Pain in thoracic spine: Secondary | ICD-10-CM | POA: Diagnosis present

## 2015-11-05 DIAGNOSIS — M545 Low back pain, unspecified: Secondary | ICD-10-CM

## 2015-11-05 DIAGNOSIS — R531 Weakness: Secondary | ICD-10-CM | POA: Insufficient documentation

## 2015-11-05 LAB — COMPREHENSIVE METABOLIC PANEL
ALK PHOS: 65 IU/L (ref 39–117)
ALT: 17 IU/L (ref 0–32)
AST: 18 IU/L (ref 0–40)
Albumin/Globulin Ratio: 2 (ref 1.1–2.5)
Albumin: 4.5 g/dL (ref 3.5–4.8)
BUN/Creatinine Ratio: 18 (ref 11–26)
BUN: 15 mg/dL (ref 8–27)
Bilirubin Total: 0.6 mg/dL (ref 0.0–1.2)
CO2: 23 mmol/L (ref 18–29)
CREATININE: 0.84 mg/dL (ref 0.57–1.00)
Calcium: 9.1 mg/dL (ref 8.7–10.3)
Chloride: 101 mmol/L (ref 96–106)
GFR calc Af Amer: 79 mL/min/{1.73_m2} (ref >59)
GFR calc non Af Amer: 69 mL/min/{1.73_m2} (ref >59)
GLOBULIN, TOTAL: 2.2 g/dL (ref 1.5–4.5)
Glucose: 99 mg/dL (ref 65–99)
POTASSIUM: 4.5 mmol/L (ref 3.5–5.2)
SODIUM: 140 mmol/L (ref 134–144)
Total Protein: 6.7 g/dL (ref 6.0–8.5)

## 2015-11-05 LAB — CBC WITH DIFFERENTIAL/PLATELET
BASOS ABS: 0 10*3/uL (ref 0.0–0.2)
BASOS: 1 %
EOS (ABSOLUTE): 0.3 10*3/uL (ref 0.0–0.4)
EOS: 4 %
HEMATOCRIT: 27.1 % — AB (ref 34.0–46.6)
Hemoglobin: 8.3 g/dL — ABNORMAL LOW (ref 11.1–15.9)
IMMATURE GRANULOCYTES: 0 %
Immature Grans (Abs): 0 10*3/uL (ref 0.0–0.1)
LYMPHS ABS: 1.7 10*3/uL (ref 0.7–3.1)
Lymphs: 22 %
MCH: 22.9 pg — ABNORMAL LOW (ref 26.6–33.0)
MCHC: 30.6 g/dL — AB (ref 31.5–35.7)
MCV: 75 fL — ABNORMAL LOW (ref 79–97)
MONOS ABS: 0.6 10*3/uL (ref 0.1–0.9)
Monocytes: 8 %
Neutrophils Absolute: 4.9 10*3/uL (ref 1.4–7.0)
Neutrophils: 65 %
PLATELETS: 550 10*3/uL — AB (ref 150–379)
RBC: 3.63 x10E6/uL — AB (ref 3.77–5.28)
RDW: 15.4 % (ref 12.3–15.4)
WBC: 7.5 10*3/uL (ref 3.4–10.8)

## 2015-11-05 LAB — LIPASE: LIPASE: 31 U/L (ref 0–59)

## 2015-11-05 LAB — TSH: TSH: 2.05 u[IU]/mL (ref 0.450–4.500)

## 2015-11-05 NOTE — Therapy (Signed)
[place] [facility] [address] [place], [address] Phone: [phone]   Fax:  [phone]  Physical Therapy Evaluation  Patient Details  Name: Claire Kane MRN: [id number] Date of Birth: 12/13/1940 Referring Provider: Danelia Snodgrass Aschoff, MD  Encounter Date: 11/05/2015      PT End of Session - 11/05/15 0936    Visit Number 1   Number of Visits 9   Date for PT Re-Evaluation 12/03/15   Authorization Type 1   Authorization Time Period of 10   PT Start Time 0936  Pt filling out paperwork   PT Stop Time 1034   PT Time Calculation (min) 58 min   Activity Tolerance Patient tolerated treatment well   Behavior During Therapy [facility] for tasks assessed/performed      Past Medical History  Diagnosis Date  . Heart murmur 2001  . Arthritis   . 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 complications   . High cholesterol   . Rosacea   . 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 Stab pheblectomy   2010  . Vein closure procedure Bilateral 2008  . Abdominal hysterectomy    . Cholecystectomy    . Breast surgery Right 2002    lumpectomy  . Eye surgery Right 2014  . Colonoscopy  2013  . Breast excisional biopsy Right     positive 2002  . Breast cyst aspiration Right     2003  . Post radiation therapy Right     right breast cancer 2002    There were no vitals filed for this visit.  Visit Diagnosis:  Bilateral thoracic back pain - Plan: PT plan of care cert/re-cert  Weakness - Plan: PT plan of care cert/re-cert  Bilateral low back pain without sciatica - Plan: PT plan of care cert/re-cert      Subjective Assessment - 11/05/15 0921    Subjective Back pain: 0/10 current (pt sitting). 8/10 at worst (usually at night in bed when she turns in bed to her side)   Pertinent History Bilateral thoracic back pain. Pt states that her back pain has improved since  yesterday. Back spasms have gone away but still feels an ache in her back. Pt states having back spasms for years, usually occurs during the holidays. Pt states doing a lot of chores and cooking. Back pain usually eases off with her heating pad. Back pain worsened during the holidays this year but had difficulty alleviating it.  Heating pad did not really work this time around. Back spasm usually occur first. When her back improves, the symptoms become an ache. Pt states having occasional constipation. Pt denies saddle anesthesia. Feels stiffness in low back.    Patient Stated Goals Just to get rid of this pain.    Currently in Pain? No/denies   Pain Score 0-No pain   Pain Location Back   Pain Descriptors / Indicators Aching  and spasms   Pain Type Chronic pain   Aggravating Factors  Turning to her side in bed, sitting, twisting when standing, reaching up   Pain Relieving Factors supine with knees slightly bent   Multiple Pain Sites No    Cardiologist appointment is next Friday 11/13/15 per pt     Objectives   There-ex  Directed patient with naval ins 10x3 (reviewed and given as part of her HEP; pt demonstrated and verbalized understanding)  L  shoulder adduction resisting yellow band 10x3  Improved exercise technique, movement at target joints, use of target muscles after mod verbal, visual, tactile cues.            [facility] PT Assessment - 11/05/15 0917    Assessment   Medical Diagnosis Bilateral thoracic back pain   Referring Provider Shadell Brenn Aschoff, MD   Onset Date/Surgical Date 11/04/15  Date physical therapy order signed   Prior Therapy No known physical therapy for current condition   Precautions   Precaution Comments No known precautions   Restrictions   Other Position/Activity Restrictions No known restrictions   Balance Screen   Has the patient fallen in the past 6 months No   Has the patient had a decrease in activity level because of a fear of falling?  No   Is  the patient reluctant to leave their home because of a fear of falling?  No   Home Environment   Living Environment --   Prior Function   Vocation Retired  Warden/ranger Requirements PLOF: better able to turn to her side in bed, sit, reach up   Posture/Postural Control   Posture Comments bilateral foot pronation, R hip in slight ER, R lateral shift, bilaterally protracted shoulders   AROM   Lumbar Flexion WFL   Lumbar Extension limited with discomfort at the thoracolumbar area (decreased thoracic extension)   Lumbar - Right Side Bend limited with reproduction of mid back pain (limited thoracic movement)   Lumbar - Left Side Bend limited with reproduction of mid back pain   Lumbar - Right Rotation limited with R rib discomfort  reproduced symptoms the most   Lumbar - Left Rotation limited with reproduction of mid back pain   PROM   Overall PROM Comments R hip extension 9 degrees. R hip IR 15 degrees, R hip ER 40 degrees. L hip IR 23 degrees, L hip ER 35 degrees   Left Hip Extension 7   Strength   Right Hip Flexion 4-/5   Right Hip External Rotation  4-/5   Right Hip ABduction 4/5   Left Hip Flexion 4-/5   Left Hip External Rotation 4-/5   Left Hip ABduction 4/5   Right Knee Flexion 4+/5   Right Knee Extension 5/5   Left Knee Flexion 4+/5   Left Knee Extension 5/5   Ambulation/Gait   Gait Comments increased L trunk rotation, decreased bilateral hip extension                           PT Education - 11/05/15 2210    Education provided Yes   Education Details ther-ex, HEP   Person(s) Educated Patient   Methods Explanation;Demonstration;Tactile cues;Verbal cues   Comprehension Verbalized understanding;Returned demonstration             PT Long Term Goals - 11/05/15 2213    PT LONG TERM GOAL #1   Title Patient will have a decrease in back pain to 5/10 or less at worst to promote ability to turn in bed at night   Time 4   Period  Weeks   Status New   PT LONG TERM GOAL #2   Title Patient will improve bilateral hip IR ROM by at least 10 degrees to promote ability to perform functional tasks with less back pain.   Time 4   Period Weeks   Status New   PT LONG TERM GOAL #3  Title Patient will improve bilateral hip strength by 1/2 MMT grade to promote ability to perform functional tasks with less back pain.    Time 4   Period Weeks   Status New               Plan - Nov 07, 2015 1259    Clinical Impression Statement Patient is a 75 year old female who came to physical therapy secondary to back pain. She also presents with altered posture, limited bilateral hip IR ROM R > L, reproduction of symptoms with trunk extension, L and R side bending, L and R trunk rotation with R rotation reproducing her symptoms the most, and difficulty with performing functional tasks such as chores, turning in bed, and reaching up. Patient will benefit from skilled physical therapy intervention to address the aforementioned deficits.    Pt will benefit from skilled therapeutic intervention in order to improve on the following deficits Pain;Decreased strength;Postural dysfunction;Decreased range of motion   Rehab Potential Good   Clinical Impairments Affecting Rehab Potential age, chronicity of condition.    PT Frequency 2x / week   PT Duration 4 weeks   PT Treatment/Interventions Manual techniques;Therapeutic activities;Therapeutic exercise;Electrical Stimulation;Aquatic Therapy;Neuromuscular re-education;Ultrasound;Patient/family education   PT Next Visit Plan posture, core strengthening, hip strengthening, hip ROM   Consulted and Agree with Plan of Care Patient          G-Codes - Nov 07, 2015 Dec 21, 2217    Functional Assessment Tool Used Patient interview, clinical presentation   Functional Limitation Mobility: Walking and moving around   Mobility: Walking and Moving Around Current Status [phone]) At least 40 percent but less than 60 percent  impaired, limited or restricted   Mobility: Walking and Moving Around Goal Status [phone]) At least 20 percent but less than 40 percent impaired, limited or restricted       Problem List Patient Active Problem List   Diagnosis Date Noted  . Cataract 11/04/2015  . Clinical depression 11/04/2015  . Dyslipidemia 11/04/2015  . 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 Acid reflux 11/04/2015  . Adiposity 11/04/2015  . 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 Awareness of heartbeats 11/04/2015  . Depression 08/19/2015  . Fibrocystic breast disease 08/19/2015  . Carotid stenosis 08/19/2015  . Osteoarthritis 08/19/2015  . Obesity 08/19/2015  . Hyperlipidemia 08/19/2015  . 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 Heart murmur, systolic 123456  . 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 Other specified symptoms and signs involving the circulatory and respiratory systems 06/17/2011  . Acid indigestion 06/17/2011  . Gout 06/17/2011    Thank you for your referral.  Joneen Boers PT, DPT   2015/11/07, 10:24 PM  [place] [facility] [address], [place], [address] Phone: [phone]   Fax:  [phone]  Name: TANASIA FINNICUM MRN: [id number] Date of Birth: Apr 15, 1941

## 2015-11-07 LAB — CBC WITH DIFFERENTIAL/PLATELET
BASOS ABS: 0 10*3/uL (ref 0.0–0.2)
Basos: 0 %
EOS (ABSOLUTE): 0.3 10*3/uL (ref 0.0–0.4)
Eos: 3 %
HEMOGLOBIN: 8 g/dL — AB (ref 11.1–15.9)
Hematocrit: 26.8 % — ABNORMAL LOW (ref 34.0–46.6)
IMMATURE GRANS (ABS): 0 10*3/uL (ref 0.0–0.1)
IMMATURE GRANULOCYTES: 0 %
LYMPHS: 33 %
Lymphocytes Absolute: 2.9 10*3/uL (ref 0.7–3.1)
MCH: 22.2 pg — ABNORMAL LOW (ref 26.6–33.0)
MCHC: 29.9 g/dL — ABNORMAL LOW (ref 31.5–35.7)
MCV: 74 fL — ABNORMAL LOW (ref 79–97)
MONOCYTES: 8 %
Monocytes Absolute: 0.7 10*3/uL (ref 0.1–0.9)
NEUTROS PCT: 56 %
Neutrophils Absolute: 5 10*3/uL (ref 1.4–7.0)
PLATELETS: 492 10*3/uL — AB (ref 150–379)
RBC: 3.61 x10E6/uL — AB (ref 3.77–5.28)
RDW: 15.7 % — ABNORMAL HIGH (ref 12.3–15.4)
WBC: 8.9 10*3/uL (ref 3.4–10.8)

## 2015-11-07 LAB — IRON AND TIBC
IRON SATURATION: 3 % — AB (ref 15–55)
Iron: 15 ug/dL — ABNORMAL LOW (ref 27–139)
TIBC: 470 ug/dL — AB (ref 250–450)
UIBC: 455 ug/dL — AB (ref 118–369)

## 2015-11-09 ENCOUNTER — Ambulatory Visit: Payer: Medicare Other

## 2015-11-09 ENCOUNTER — Encounter: Payer: Self-pay | Admitting: Family Medicine

## 2015-11-09 DIAGNOSIS — R531 Weakness: Secondary | ICD-10-CM

## 2015-11-09 DIAGNOSIS — M546 Pain in thoracic spine: Secondary | ICD-10-CM | POA: Diagnosis not present

## 2015-11-09 DIAGNOSIS — M545 Low back pain, unspecified: Secondary | ICD-10-CM

## 2015-11-09 NOTE — Therapy (Signed)
[place] [facility] [address], [place], [address] Phone: [phone]   Fax:  [phone]  Physical Therapy Treatment  Patient Details  Name: RUWEYDA KAAS MRN: [id number] Date of Birth: 07-Jun-1941 Referring Provider: Ashlei Chinchilla Aschoff, MD  Encounter Date: 11/09/2015      PT End of Session - 11/09/15 1118    Visit Number 2   Number of Visits 9   Date for PT Re-Evaluation 12/03/15   Authorization Type 2   Authorization Time Period of 10   PT Start Time 1118   PT Stop Time 1200   PT Time Calculation (min) 42 min   Activity Tolerance Patient tolerated treatment well   Behavior During Therapy [facility] for tasks assessed/performed      Past Medical History  Diagnosis Date  . Heart murmur 2001  . Arthritis   . 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 complications   . High cholesterol   . Rosacea   . 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 Stab pheblectomy   2010  . Vein closure procedure Bilateral 2008  . Abdominal hysterectomy    . Cholecystectomy    . Breast surgery Right 2002    lumpectomy  . Eye surgery Right 2014  . Colonoscopy  2013  . Breast excisional biopsy Right     positive 2002  . Breast cyst aspiration Right     2003  . Post radiation therapy Right     right breast cancer 2002    There were no vitals filed for this visit.  Visit Diagnosis:  Bilateral thoracic back pain  Weakness  Bilateral low back pain without sciatica      Subjective Assessment - 11/09/15 1119    Subjective Pt states the mid back pain is better. Now an ache on the L thoracic and L low back. Some difficulty turning to the L when driving.    Pertinent History Bilateral thoracic back pain. Pt states that her back pain has improved since yesterday. Back spasms have gone away but still feels an ache in her back. Pt states having back spasms for years,  usually occurs during the holidays. Pt states doing a lot of chores and cooking. Back pain usually eases off with her heating pad. Back pain worsened during the holidays this year but had difficulty alleviating it.  Heating pad did not really work this time around. Back spasm usually occur first. When her back improves, the symptoms become an ache. Pt states having occasional constipation. Pt denies saddle anesthesia. Feels stiffness in low back.    Patient Stated Goals Just to get rid of this pain.    Currently in Pain? No/denies   Multiple Pain Sites No         Objectives  There-ex  Directed patient with log rolling multiple time to the R and to the L to promote ability to turn in bed and perform supine <> sit transfers without back pain  Supine transversus abdominis contraction 10x5 seconds   Then with pelvic floor contraction 10x2 with 5 second holds   Then with hip fall outs (less than 45 degrees) 5x2 each LE with emphasis on lumbopelvic control.    Reviewed and given as part of her HEP. Pt demonstrated and verbalized understanding. Please see patient instructions.  Standing bilateral shoulder extension resisting yellow band 10x3  4 lbs each UE: forward  wedding march 32 ft x 4 to promote hip strengthening.    Improved exercise technique, movement at target joints, use of target muscles after mod verbal, visual, tactile cues.  Increased time with lumbopelvic control exercises to promote proper muscle activation and technique.    Difficulty with lumbopelvic control L > R during hip fall out exercise but improved with cues.  Decreased L thoracic and low back discomfort and stiffness towards end of session.                         PT Education - 11/09/15 1159    Education provided Yes   Education Details ther-ex, HEP   Person(s) Educated Patient   Methods Explanation;Demonstration;Tactile cues;Verbal cues;Handout   Comprehension Verbalized  understanding;Returned demonstration             PT Long Term Goals - 11/05/15 2213    PT LONG TERM GOAL #1   Title Patient will have a decrease in back pain to 5/10 or less at worst to promote ability to turn in bed at night   Time 4   Period Weeks   Status New   PT LONG TERM GOAL #2   Title Patient will improve bilateral hip IR ROM by at least 10 degrees to promote ability to perform functional tasks with less back pain.   Time 4   Period Weeks   Status New   PT LONG TERM GOAL #3   Title Patient will improve bilateral hip strength by 1/2 MMT grade to promote ability to perform functional tasks with less back pain.    Time 4   Period Weeks   Status New               Plan - 11/09/15 1200    Clinical Impression Statement Difficulty with lumbopelvic control L > R during hip fall out exercise but improved with cues.  Decreased L thoracic and low back discomfort and stiffness towards end of session.    Pt will benefit from skilled therapeutic intervention in order to improve on the following deficits Pain;Decreased strength;Postural dysfunction;Decreased range of motion   Rehab Potential Good   Clinical Impairments Affecting Rehab Potential age, chronicity of condition.    PT Frequency 2x / week   PT Duration 4 weeks   PT Treatment/Interventions Manual techniques;Therapeutic activities;Therapeutic exercise;Electrical Stimulation;Aquatic Therapy;Neuromuscular re-education;Ultrasound;Patient/family education   PT Next Visit Plan posture, core strengthening, hip strengthening, hip ROM   Consulted and Agree with Plan of Care Patient        Problem List Patient Active Problem List   Diagnosis Date Noted  . Cataract 11/04/2015  . Clinical depression 11/04/2015  . Dyslipidemia 11/04/2015  . 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 Acid reflux 11/04/2015  . Adiposity 11/04/2015  . 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 Nonspecific reaction to tuberculin skin test without  active tuberculosis 11/04/2015  . Awareness of heartbeats 11/04/2015  . Depression 08/19/2015  . Fibrocystic breast disease 08/19/2015  . Carotid stenosis 08/19/2015  . Osteoarthritis 08/19/2015  . Obesity 08/19/2015  . Hyperlipidemia 08/19/2015  . 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 Heart murmur, systolic 123456  . 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 Other specified symptoms and signs involving the circulatory and respiratory systems 06/17/2011  . Acid indigestion 06/17/2011  . Gout 06/17/2011    Joneen Boers PT, DPT   11/09/2015, 12:12 PM  [place]  [facility] [address] [place], [address] Phone: [phone]   Fax:  [phone]  Name: CORIANN STARBIRD MRN: [id number] Date of Birth: Oct 08, 1940

## 2015-11-09 NOTE — Patient Instructions (Signed)
Gave supine transversus abdominis and pelvic floor contraction   As well as supine hip fall outs   10x3 with 5 second holds 3x/day, 5 days/week each  as part of her HEP.   Pt demonstrated and verbalized understanding.

## 2015-11-10 ENCOUNTER — Encounter: Payer: Self-pay | Admitting: Family Medicine

## 2015-11-10 ENCOUNTER — Ambulatory Visit (INDEPENDENT_AMBULATORY_CARE_PROVIDER_SITE_OTHER): Payer: Medicare Other | Admitting: Family Medicine

## 2015-11-10 VITALS — BP 132/64 | HR 92 | Temp 97.6°F | Resp 16 | Wt 195.0 lb

## 2015-11-10 DIAGNOSIS — R0602 Shortness of breath: Secondary | ICD-10-CM | POA: Diagnosis not present

## 2015-11-10 DIAGNOSIS — D649 Anemia, unspecified: Secondary | ICD-10-CM | POA: Diagnosis not present

## 2015-11-10 DIAGNOSIS — D509 Iron deficiency anemia, unspecified: Secondary | ICD-10-CM | POA: Diagnosis not present

## 2015-11-10 MED ORDER — IRON 325 (65 FE) MG PO TABS: 325.0000 mg | ORAL_TABLET | Freq: Two times a day (BID) | ORAL | Status: DC

## 2015-11-10 NOTE — Progress Notes (Signed)
Patient ID: Claire Kane, female   DOB: [birth year], 75 y.o.   MRN: [id number]    Subjective:  HPI Pt is here for a 1 week follow up. She was seen on 11/04/15 for SOB, back pain and abdominal discomfort. Labs were ordered and found that she was anemic. Repeat CBC was ordered with iron studies. She has no history of anemia. Pt is still having the shortness of breath. She is seeing cardiology this week and she is doing Pt for her back and that has seemed to improve.  Back pain is better and patient  seems to be doing well with physical therapy for this.abdominal and epigastric symptoms have completely resolved. She has had no vaginal bleeding and no rectal bleeding or change in bowel habits. Prior to Admission medications   Medication Sig Start Date End Date Taking? Authorizing Provider  aspirin 81 MG tablet Take 81 mg by mouth daily.   Yes Historical Provider, MD  atorvastatin (LIPITOR) 10 MG tablet Take 10 mg by mouth daily.   Yes Historical Provider, MD  calcium citrate (CALCITRATE - DOSED IN MG ELEMENTAL CALCIUM) 950 MG tablet Take 200 mg of elemental calcium by mouth daily.   Yes Historical Provider, MD  clotrimazole-betamethasone (LOTRISONE) cream Apply 1 application topically 2 (two) times daily. 11/04/15  Yes Hitoshi Werts Maceo Pro., MD  cyclobenzaprine (FLEXERIL) 10 MG tablet Take 1 tablet (10 mg total) by mouth 3 (three) times daily as needed for muscle spasms. 10/28/15  Yes Stanley Lyness Maceo Pro., MD  ezetimibe (ZETIA) 10 MG tablet Take 10 mg by mouth daily.   Yes Historical Provider, MD  glucosamine-chondroitin 500-400 MG tablet Take 1 tablet by mouth 3 (three) times daily.   Yes Historical Provider, MD  ibuprofen (ADVIL,MOTRIN) 200 MG tablet Take by mouth.   Yes Historical Provider, MD  Multiple Vitamins-Minerals (MULTIVITAMIN WITH MINERALS) tablet Take 1 tablet by mouth daily.   Yes Historical Provider, MD  Omega-3 Fatty Acids (FISH OIL BURP-LESS) 1000 MG CAPS Take by mouth.   Yes Historical  Provider, MD  ORACEA 40 MG capsule  03/02/15  Yes Historical Provider, MD  psyllium (REGULOID) 0.52 G capsule Take 0.52 g by mouth daily.   Yes Historical Provider, MD  ranitidine (ZANTAC) 150 MG tablet Take 1 tablet (150 mg total) by mouth 2 (two) times daily. 05/28/15  Yes Harjas Biggins Maceo Pro., MD  spironolactone (ALDACTONE) 50 MG tablet  12/29/14  Yes Historical Provider, MD  WELCHOL 625 MG tablet Take 1 tablet by mouth daily. 05/21/13  Yes Historical Provider, MD    Patient Active Problem List   Diagnosis Date Noted  . Cataract 11/04/2015  . Clinical depression 11/04/2015  . Dyslipidemia 11/04/2015  . 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 Acid reflux 11/04/2015  . Adiposity 11/04/2015  . 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 Awareness of heartbeats 11/04/2015  . Depression 08/19/2015  . Fibrocystic breast disease 08/19/2015  . Carotid stenosis 08/19/2015  . Osteoarthritis 08/19/2015  . Obesity 08/19/2015  . Hyperlipidemia 08/19/2015  . 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 Heart murmur, systolic 123456  . 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 Other specified symptoms and signs involving the circulatory and respiratory systems 06/17/2011  . Acid indigestion 06/17/2011  . Gout 06/17/2011    Past Medical History  Diagnosis Date  . Heart murmur 2001  . Arthritis   .  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 complications   . High cholesterol   . Rosacea   . 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 Marital Status: Married    Spouse Name: N/A  . Number of Children: N/A  . Years of Education: N/A   Occupational History  . Not on file.   Social History Main Topics  . Smoking status: Never Smoker   . Smokeless tobacco: Never Used  . Alcohol Use: No  . Drug Use:  No  . Sexual Activity: No   Other Topics Concern  . Not on file   Social History Narrative    No Known Allergies  Review of Systems  Constitutional: Positive for malaise/fatigue.  HENT: Negative.   Eyes: Negative.   Respiratory: Positive for shortness of breath.   Cardiovascular: Negative.   Gastrointestinal: Negative.   Genitourinary: Negative.   Musculoskeletal: Positive for back pain.  Skin: Negative.   Neurological: Negative.   Endo/Heme/Allergies: Negative.   Psychiatric/Behavioral: Negative.     Immunization History  Administered Date(s) Administered  . Influenza, High Dose Seasonal PF 05/28/2015  . Pneumococcal Conjugate-13 04/22/2014  . Pneumococcal Polysaccharide-23 03/15/2011  . Td 09/06/2007  . Tdap 04/22/2014  . Zoster 06/27/2011   Objective:  BP 132/64 mmHg  Pulse 92  Temp(Src) 97.6 F (36.4 C) (Oral)  Resp 16  Wt 195 lb (88.451 kg)  SpO2 98%  Physical Exam  Constitutional: She is oriented to person, place, and time and well-developed, well-nourished, and in no distress.  Eyes: Conjunctivae and EOM are normal. Pupils are equal, round, and reactive to light.  Neck: Normal range of motion. Neck supple.  Cardiovascular: Normal rate, regular rhythm, normal heart sounds and intact distal pulses.   Pulmonary/Chest: Effort normal and breath sounds normal.  Abdominal: Soft. Bowel sounds are normal.  Musculoskeletal: Normal range of motion.  Neurological: She is alert and oriented to person, place, and time. She has normal reflexes. Gait normal. GCS score is 15.  Skin: Skin is warm and dry.  Psychiatric: Mood, memory, affect and judgment normal.    Lab Results  Component Value Date   WBC 8.9 11/06/2015   HCT 26.8* 11/06/2015   PLT 492* 11/06/2015   GLUCOSE 99 11/04/2015   TSH 2.050 11/04/2015    CMP     Component Value Date/Time   NA 140 11/04/2015 1204   K 4.5 11/04/2015 1204   CL 101 11/04/2015 1204   CO2 23 11/04/2015 1204   GLUCOSE 99  11/04/2015 1204   BUN 15 11/04/2015 1204   CREATININE 0.84 11/04/2015 1204   CALCIUM 9.1 11/04/2015 1204   PROT 6.7 11/04/2015 1204   ALBUMIN 4.5 11/04/2015 1204   AST 18 11/04/2015 1204   ALT 17 11/04/2015 1204   ALKPHOS 65 11/04/2015 1204   BILITOT 0.6 11/04/2015 1204   GFRNONAA 69 11/04/2015 1204   GFRAA 79 11/04/2015 1204    Assessment and Plan :  1. Shortness of breath Undergoing cardiology workup. Certainly anemia is contributing to this to some degree. May need to be transfused if hemoglobin drops further.  2. Anemia, unspecified anemia type Needs GI workup and if this is normal need hematology referral. CBC repeated today. I'll see her back in 3 weeks, probably after GI workup.  3. Iron deficiency anemia  - Ferrous Sulfate (IRON) 325 (65 Fe) MG TABS; Take 325 mg by mouth 2 (two) times daily.  Dispense: 60 each; Refill: 12 - CBC with Differential/Platelet - Protein electrophoresis, serum - Ambulatory referral to General Surgery 4. Thoracic back pain/ upper LS back pain Improved with physical therapy.May require further evaluation. I have done the exam and reviewed the above chart and it is accurate to the best of my knowledge.  Miguel Aschoff MD [facility] 11/10/2015 3:11 PM

## 2015-11-11 ENCOUNTER — Ambulatory Visit: Payer: Medicare Other

## 2015-11-11 DIAGNOSIS — M545 Low back pain, unspecified: Secondary | ICD-10-CM

## 2015-11-11 DIAGNOSIS — M546 Pain in thoracic spine: Secondary | ICD-10-CM | POA: Diagnosis not present

## 2015-11-11 DIAGNOSIS — R531 Weakness: Secondary | ICD-10-CM

## 2015-11-11 NOTE — Therapy (Signed)
[facility] [address] [place], [address] Phone: [phone]   Fax:  [phone]  Physical Therapy Treatment  Patient Details  Name: Claire Kane MRN: [id number] Date of Birth: November 12, 1940 Referring Provider: Sunya Humbarger Aschoff, MD  Encounter Date: 11/11/2015      PT End of Session - 11/11/15 1003    Visit Number 3   Number of Visits 9   Date for PT Re-Evaluation 12/03/15   Authorization Type 3   Authorization Time Period of 10   PT Start Time 1003   PT Stop Time 1047   PT Time Calculation (min) 44 min   Activity Tolerance Patient tolerated treatment well   Behavior During Therapy [facility] for tasks assessed/performed      Past Medical History  Diagnosis Date  . Heart murmur 2001  . Arthritis   . 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 complications   . High cholesterol   . Rosacea   . 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 Stab pheblectomy   2010  . Vein closure procedure Bilateral 2008  . Abdominal hysterectomy    . Cholecystectomy    . Breast surgery Right 2002    lumpectomy  . Eye surgery Right 2014  . Colonoscopy  2013  . Breast excisional biopsy Right     positive 2002  . Breast cyst aspiration Right     2003  . Post radiation therapy Right     right breast cancer 2002    There were no vitals filed for this visit.  Visit Diagnosis:  Bilateral thoracic back pain  Weakness  Bilateral low back pain without sciatica      Subjective Assessment - 11/11/15 1007    Subjective 4-5/10 L posterior thoracic ache sitting earlier. Feels better lying down with knees bent   Pertinent History Bilateral thoracic back pain. Pt states that her back pain has improved since yesterday. Back spasms have gone away but still feels an ache in her back. Pt states having back spasms for years, usually occurs during the holidays. Pt states  doing a lot of chores and cooking. Back pain usually eases off with her heating pad. Back pain worsened during the holidays this year but had difficulty alleviating it.  Heating pad did not really work this time around. Back spasm usually occur first. When her back improves, the symptoms become an ache. Pt states having occasional constipation. Pt denies saddle anesthesia. Feels stiffness in low back.    Patient Stated Goals Just to get rid of this pain.    Currently in Pain? Yes   Pain Score 5    Pain Descriptors / Indicators Aching   Multiple Pain Sites No    Pt also states that if she twists to the L, she feels discomfort in her thoracic spine.      Objectives  There-ex  Directed patient with log rolling with sit <> supine multiple times  Supine transversus abdominis with pelvic floor contraction 10x with 5 second holds Then with hip fall outs (less than 45 degrees) 10x2 each LE with emphasis on lumbopelvic control.   Increased time with exercise secondary to emphasis on control and quality of movement.   Standing bilateral shoulder extension resisting yellow band 10x3   Standing shoulder adduction yellow band 10x2 L UE; 10x3 R UE (slight decrease in L posterior thoracic discomfort with  R shoulder adduction exercise)    Improved exercise technique, movement at target joints, use of target muscles after mod verbal, visual, tactile cues.      Manual therapy:   Palpation: R rotated mid thoracic spine with kyphosis  Soft tissue mobilization to L paraspinal muscles mid thoracic spine in R S/L. Decreased L posterior thoracic ache to 1/10 afterwards.     Some difficulty with L lumbopelvic control with supine hip fall-outs with improved with cues and repetition. Decreased L posterior thoracic ache after R shoulder adduction exercise as well as after manual therapy to L thoracic paraspinal muscles.             PT  Education - 11/11/15 1009    Education provided Yes   Education Details ther-ex   [employer]) Educated Patient   Methods Explanation;Demonstration;Tactile cues;Verbal cues   Comprehension Verbalized understanding;Returned demonstration             PT Long Term Goals - 11/05/15 2213    PT LONG TERM GOAL #1   Title Patient will have a decrease in back pain to 5/10 or less at worst to promote ability to turn in bed at night   Time 4   Period Weeks   Status New   PT LONG TERM GOAL #2   Title Patient will improve bilateral hip IR ROM by at least 10 degrees to promote ability to perform functional tasks with less back pain.   Time 4   Period Weeks   Status New   PT LONG TERM GOAL #3   Title Patient will improve bilateral hip strength by 1/2 MMT grade to promote ability to perform functional tasks with less back pain.    Time 4   Period Weeks   Status New               Plan - 11/11/15 1013    Clinical Impression Statement Some difficulty with L lumbopelvic control with supine hip fall-outs with improved with cues and repetition. Decreased L posterior thoracic ache after R shoulder adduction exercise as well as after manual therapy to L thoracic paraspinal muscles.    Pt will benefit from skilled therapeutic intervention in order to improve on the following deficits Pain;Decreased strength;Postural dysfunction;Decreased range of motion   Rehab Potential Good   Clinical Impairments Affecting Rehab Potential age, chronicity of condition.    PT Frequency 2x / week   PT Duration 4 weeks   PT Treatment/Interventions Manual techniques;Therapeutic activities;Therapeutic exercise;Electrical Stimulation;Aquatic Therapy;Neuromuscular re-education;Ultrasound;Patient/family education   PT Next Visit Plan posture, core strengthening, hip strengthening, hip ROM   Consulted and Agree with Plan of Care Patient        Problem List Patient Active Problem List   Diagnosis Date Noted  .  Cataract 11/04/2015  . Clinical depression 11/04/2015  . Dyslipidemia 11/04/2015  . 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 Acid reflux 11/04/2015  . Adiposity 11/04/2015  . 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 Awareness of heartbeats 11/04/2015  . Depression 08/19/2015  . Fibrocystic breast disease 08/19/2015  . Carotid stenosis 08/19/2015  . Osteoarthritis 08/19/2015  . Obesity 08/19/2015  . Hyperlipidemia 08/19/2015  . 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 Heart murmur, systolic 123456  . 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 Other specified symptoms and signs involving the circulatory and respiratory systems 06/17/2011  . Acid indigestion 06/17/2011  .  Gout 06/17/2011   Joneen Boers PT, DPT   11/11/2015, 10:54 AM  [facility] [address], [place], [address] Phone: [phone]   Fax:  [phone]  Name: MELENI SAMAHA MRN: [id number] Date of Birth: 02/08/1941

## 2015-11-12 LAB — PROTEIN ELECTROPHORESIS, SERUM
A/G RATIO SPE: 1.4 (ref 0.7–1.7)
Albumin ELP: 4.2 g/dL (ref 2.9–4.4)
Alpha 1: 0.2 g/dL (ref 0.0–0.4)
Alpha 2: 0.7 g/dL (ref 0.4–1.0)
BETA: 1.3 g/dL (ref 0.7–1.3)
GLOBULIN, TOTAL: 2.9 g/dL (ref 2.2–3.9)
Gamma Globulin: 0.7 g/dL (ref 0.4–1.8)
TOTAL PROTEIN: 7.1 g/dL (ref 6.0–8.5)

## 2015-11-12 LAB — CBC WITH DIFFERENTIAL/PLATELET
BASOS ABS: 0.1 10*3/uL (ref 0.0–0.2)
Basos: 1 %
EOS (ABSOLUTE): 0.3 10*3/uL (ref 0.0–0.4)
Eos: 3 %
Hematocrit: 26.4 % — ABNORMAL LOW (ref 34.0–46.6)
Hemoglobin: 7.9 g/dL — ABNORMAL LOW (ref 11.1–15.9)
IMMATURE GRANULOCYTES: 0 %
Immature Grans (Abs): 0 10*3/uL (ref 0.0–0.1)
LYMPHS: 33 %
Lymphocytes Absolute: 3.2 10*3/uL — ABNORMAL HIGH (ref 0.7–3.1)
MCH: 22.4 pg — ABNORMAL LOW (ref 26.6–33.0)
MCHC: 29.9 g/dL — ABNORMAL LOW (ref 31.5–35.7)
MCV: 75 fL — AB (ref 79–97)
MONOS ABS: 0.9 10*3/uL (ref 0.1–0.9)
Monocytes: 9 %
NEUTROS PCT: 54 %
Neutrophils Absolute: 5.3 10*3/uL (ref 1.4–7.0)
PLATELETS: 447 10*3/uL — AB (ref 150–379)
RBC: 3.52 x10E6/uL — AB (ref 3.77–5.28)
RDW: 15.2 % (ref 12.3–15.4)
WBC: 9.6 10*3/uL (ref 3.4–10.8)

## 2015-11-13 DIAGNOSIS — R0602 Shortness of breath: Secondary | ICD-10-CM | POA: Insufficient documentation

## 2015-11-13 DIAGNOSIS — I6529 Occlusion and stenosis of unspecified carotid artery: Secondary | ICD-10-CM | POA: Insufficient documentation

## 2015-11-16 ENCOUNTER — Ambulatory Visit: Payer: Medicare Other

## 2015-11-16 DIAGNOSIS — R531 Weakness: Secondary | ICD-10-CM

## 2015-11-16 DIAGNOSIS — M545 Low back pain, unspecified: Secondary | ICD-10-CM

## 2015-11-16 DIAGNOSIS — M546 Pain in thoracic spine: Secondary | ICD-10-CM | POA: Diagnosis not present

## 2015-11-16 NOTE — Therapy (Signed)
[facility] [address] [place], [address] Phone: [phone]   Fax:  [phone]  Physical Therapy Treatment  Patient Details  Name: Claire Kane MRN: [id number] Date of Birth: 01-15-1941 Referring Provider: Wei Poplaski Aschoff, MD  Encounter Date: 11/16/2015      PT End of Session - 11/16/15 1102    Visit Number 4   Number of Visits 9   Date for PT Re-Evaluation 12/03/15   Authorization Type 4   Authorization Time Period of 10   PT Start Time 1103   PT Stop Time 1153   PT Time Calculation (min) 50 min   Activity Tolerance Patient tolerated treatment well   Behavior During Therapy [facility] for tasks assessed/performed      Past Medical History  Diagnosis Date  . Heart murmur 2001  . Arthritis   . 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 complications   . High cholesterol   . Rosacea   . 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 Stab pheblectomy   2010  . Vein closure procedure Bilateral 2008  . Abdominal hysterectomy    . Cholecystectomy    . Breast surgery Right 2002    lumpectomy  . Eye surgery Right 2014  . Colonoscopy  2013  . Breast excisional biopsy Right     positive 2002  . Breast cyst aspiration Right     2003  . Post radiation therapy Right     right breast cancer 2002    There were no vitals filed for this visit.  Visit Diagnosis:  Bilateral thoracic back pain  Bilateral low back pain without sciatica  Weakness      Subjective Assessment - 11/16/15 1103    Subjective Pt states not taking her back pain medication secondary to anemia.  Went to her cardiologist and was told that her heart was functioning properly and just has age related issues. Back hurts currently Back bothered her last night (4/10) maiking sleep difficult. 4/10 currenlty. Pt adds that she vacuumed.    Pertinent History Bilateral thoracic back  pain. Pt states that her back pain has improved since yesterday. Back spasms have gone away but still feels an ache in her back. Pt states having back spasms for years, usually occurs during the holidays. Pt states doing a lot of chores and cooking. Back pain usually eases off with her heating pad. Back pain worsened during the holidays this year but had difficulty alleviating it.  Heating pad did not really work this time around. Back spasm usually occur first. When her back improves, the symptoms become an ache. Pt states having occasional constipation. Pt denies saddle anesthesia. Feels stiffness in low back.    Patient Stated Goals Just to get rid of this pain.    Currently in Pain? Yes   Pain Score 4    Multiple Pain Sites No          Objectives  There-ex  Directed patient with seated L trunk rotation 10x2 Seated posterior pelvic tilt 10x Seated anterior pelvic tilt 5x Seated bilateral scapular retraction 10x3 with 5 second holds  (Slight increase in L back discomfort)  S/L clam shell 10x3 each side with transversus abdominis and pelvic floor contraction    Reviewed and given as part of her HEP. Pt demonstrated and verbalized understanding.  Standing shoulder adduction 10x3 with 5 second holds  R UE resisting red band (decreased L back pain) Standing R trunk side bend 7x5 second holds  Improved exercise technique, movement at target joints, use of target muscles after mod verbal, visual, tactile cues.    Manual therapy:    Soft tissue mobilization to L paraspinal muscles mid thoracic spine in R S/L    Decreased back pain to 2-3/10 after session.                          PT Education - 11/16/15 1110    Education provided Yes   Education Details ther-ex   [employer]) Educated Patient   Methods Explanation;Demonstration;Tactile cues;Verbal cues;Handout   Comprehension Verbalized understanding;Returned demonstration             PT Long Term Goals -  11/05/15 2213    PT LONG TERM GOAL #1   Title Patient will have a decrease in back pain to 5/10 or less at worst to promote ability to turn in bed at night   Time 4   Period Weeks   Status New   PT LONG TERM GOAL #2   Title Patient will improve bilateral hip IR ROM by at least 10 degrees to promote ability to perform functional tasks with less back pain.   Time 4   Period Weeks   Status New   PT LONG TERM GOAL #3   Title Patient will improve bilateral hip strength by 1/2 MMT grade to promote ability to perform functional tasks with less back pain.    Time 4   Period Weeks   Status New               Plan - 11/16/15 1110    Clinical Impression Statement Decreased back pain to 2-3/10 after session.    Pt will benefit from skilled therapeutic intervention in order to improve on the following deficits Pain;Decreased strength;Postural dysfunction;Decreased range of motion   Rehab Potential Good   Clinical Impairments Affecting Rehab Potential age, chronicity of condition.    PT Frequency 2x / week   PT Duration 4 weeks   PT Treatment/Interventions Manual techniques;Therapeutic activities;Therapeutic exercise;Electrical Stimulation;Aquatic Therapy;Neuromuscular re-education;Ultrasound;Patient/family education   PT Next Visit Plan posture, core strengthening, hip strengthening, hip ROM   Consulted and Agree with Plan of Care Patient        Problem List Patient Active Problem List   Diagnosis Date Noted  . Cataract 11/04/2015  . Clinical depression 11/04/2015  . Dyslipidemia 11/04/2015  . 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 Acid reflux 11/04/2015  . Adiposity 11/04/2015  . 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 Awareness of heartbeats 11/04/2015  . Depression 08/19/2015  . Fibrocystic breast disease 08/19/2015  . Carotid stenosis 08/19/2015  . Osteoarthritis 08/19/2015  . Obesity  08/19/2015  . Hyperlipidemia 08/19/2015  . 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 Heart murmur, systolic 123456  . 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 Other specified symptoms and signs involving the circulatory and respiratory systems 06/17/2011  . Acid indigestion 06/17/2011  . Gout 06/17/2011    Joneen Boers PT, DPT   11/16/2015, 12:04 PM  [facility] [address], [place], [address] Phone: [phone]   Fax:  [phone]  Name: Claire Kane MRN: [id number] Date of Birth: Mar 17, 1941

## 2015-11-16 NOTE — Patient Instructions (Signed)
Clam Shell    Lying with hips and knees bent  one pillow between knees and ankles. Lift knee. Be sure pelvis does not roll backward. Do not arch back. Do _10__ times, each leg, _3__ times per day.  http://ss.exer.us/75   Copyright  VHI. All rights reserved.

## 2015-11-18 ENCOUNTER — Ambulatory Visit: Payer: Medicare Other | Attending: Family Medicine

## 2015-11-18 ENCOUNTER — Other Ambulatory Visit: Payer: Self-pay | Admitting: Emergency Medicine

## 2015-11-18 ENCOUNTER — Ambulatory Visit (INDEPENDENT_AMBULATORY_CARE_PROVIDER_SITE_OTHER): Payer: Medicare Other | Admitting: General Surgery

## 2015-11-18 ENCOUNTER — Encounter: Payer: Self-pay | Admitting: General Surgery

## 2015-11-18 ENCOUNTER — Ambulatory Visit: Payer: Medicare Other | Admitting: General Surgery

## 2015-11-18 VITALS — BP 142/70 | HR 80 | Resp 12 | Ht 65.0 in | Wt 194.0 lb

## 2015-11-18 DIAGNOSIS — M545 Low back pain, unspecified: Secondary | ICD-10-CM

## 2015-11-18 DIAGNOSIS — M546 Pain in thoracic spine: Secondary | ICD-10-CM | POA: Insufficient documentation

## 2015-11-18 DIAGNOSIS — R531 Weakness: Secondary | ICD-10-CM | POA: Diagnosis present

## 2015-11-18 DIAGNOSIS — D509 Iron deficiency anemia, unspecified: Secondary | ICD-10-CM

## 2015-11-18 DIAGNOSIS — D649 Anemia, unspecified: Secondary | ICD-10-CM

## 2015-11-18 MED ORDER — POLYETHYLENE GLYCOL 3350 17 GM/SCOOP PO POWD: ORAL | Status: DC

## 2015-11-18 NOTE — Patient Instructions (Addendum)
Upper Cervical Flexion / Extension   Gently flex and extend upper neck by nodding head. Try to make a "long neck". Hold ___5_ seconds. Repeat _10___ times per set. Do 3____ sets per session. Do ___1_ sessions per day.  http://orth.exer.us/351   Copyright  VHI. All rights reserved.    Clam Shell    Lying with hips and knees bent one pillow between knees and ankles. Lift knee. Be sure pelvis does not roll backward. Do not arch back. Do _10__ times, each leg, _3__ times per day.  http://ss.exer.us/75   Copyright  VHI. All rights reserved.    (Home) Extension: Thoracic With Lumbar Lock - Sitting    Sit with back against chair, knees bent, hands folded across (not shown). Extend trunk over chair back. Hold position for __5__ seconds. Repeat ___10  times per set. Do _3___ sets per session daily.   Copyright  VHI. All rights reserved.  Pt was instructed to cross arms across body instead of behind neck for the seated thoracic extension exercise (above exercise). Pt verbalized understanding.

## 2015-11-18 NOTE — Patient Instructions (Addendum)
Colonoscopy A colonoscopy is an exam to look at the entire large intestine (colon). This exam can help find problems such as tumors, polyps, inflammation, and areas of bleeding. The exam takes about 1 hour.  LET [facility] CARE PROVIDER KNOW ABOUT:   Any allergies you have.  All medicines you are taking, including vitamins, herbs, eye drops, creams, and over-the-counter medicines.  Previous problems you or members of your family have had with the use of anesthetics.  Any blood disorders you have.  Previous surgeries you have had.  Medical conditions you have. RISKS AND COMPLICATIONS  Generally, this is a safe procedure. However, as with any procedure, complications can occur. Possible complications include:  Bleeding.  Tearing or rupture of the colon wall.  Reaction to medicines given during the exam.  Infection (rare). BEFORE THE PROCEDURE   Ask your health care provider about changing or stopping your regular medicines.  You may be prescribed an oral bowel prep. This involves drinking a large amount of medicated liquid, starting the day before your procedure. The liquid will cause you to have multiple loose stools until your stool is almost clear or light green. This cleans out your colon in preparation for the procedure.  Do not eat or drink anything else once you have started the bowel prep, unless your health care provider tells you it is safe to do so.  Arrange for someone to drive you home after the procedure. PROCEDURE   You will be given medicine to help you relax (sedative).  You will lie on your side with your knees bent.  A long, flexible tube with a light and camera on the end (colonoscope) will be inserted through the rectum and into the colon. The camera sends video back to a computer screen as it moves through the colon. The colonoscope also releases carbon dioxide gas to inflate the colon. This helps your health care provider see the area better.  During  the exam, your health care provider may take a small tissue sample (biopsy) to be examined under a microscope if any abnormalities are found.  The exam is finished when the entire colon has been viewed. AFTER THE PROCEDURE   Do not drive for 24 hours after the exam.  You may have a small amount of blood in your stool.  You may pass moderate amounts of gas and have mild abdominal cramping or bloating. This is caused by the gas used to inflate your colon during the exam.  Ask when your test results will be ready and how you will get your results. Make sure you get your test results.   This information is not intended to replace advice given to you by your health care provider. Make sure you discuss any questions you have with your health care provider.   Document Released: 09/02/2000 Document Revised: 06/26/2013 Document Reviewed: 05/13/2013 Elsevier Interactive Patient Education [employer].  Patient has been scheduled for an upper and lower endoscopy on 12-08-15 at [facility]. This patient has been asked to discontinue fish oil one week prior to procedure.

## 2015-11-18 NOTE — Progress Notes (Signed)
Patient ID: Claire Kane, female   DOB: Jan 10, 1941, 75 y.o.   MRN: [id number]  Chief Complaint  Patient presents with  . Anemia  . Colonoscopy    HPI Claire Kane is a 75 y.o. female.  Here today to discuss having a colonoscopy, she states she may need and endoscopy as well to find out why she has anemia. Her stool guaiac was negative from Dr Rosanna Randy. She denies any gastrointestinal issues. Her bowels move at least every other day, no bleeding. Pt reports having back spasms for a long time and has been using advil and aleve off and on.  I have reviewed the history of present illness with the patient.   HPI  Past Medical History  Diagnosis Date  . Heart murmur 2001  . Arthritis   . 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 complications   . High cholesterol   . Rosacea   . Cancer [facility]) 2002    DCIS right breast     Past Surgical History  Procedure Laterality Date  . Stab pheblectomy   2010  . Vein closure procedure Bilateral 2008  . Abdominal hysterectomy    . Cholecystectomy    . Breast surgery Right 2002    lumpectomy  . Eye surgery Right 2014  . Colonoscopy  2013  . Breast excisional biopsy Right     positive 2002  . Breast cyst aspiration Right     2003  . Post radiation therapy Right     right breast cancer 2002    Family History  Problem Relation Age of Onset  . Breast cancer Cousin 28    breast/maternal  . Cancer Cousin 66    breast/maternal  . Breast cancer Maternal Aunt 70  . Heart attack Mother   . Heart attack Father   . Heart attack Sister   . Stroke Sister   . Heart attack Brother   . Other Brother     cerebral hemorrhage, sepsis  . Breast cancer Other 48    maternal neice    Social History Social History  Substance Use Topics  . Smoking status: Never Smoker   . Smokeless tobacco: Never Used  . Alcohol Use: No    No Known Allergies  Current Outpatient Prescriptions  Medication Sig Dispense Refill  . aspirin 81 MG tablet Take  81 mg by mouth daily.    Marland Kitchen atorvastatin (LIPITOR) 10 MG tablet Take 10 mg by mouth daily.    . calcium citrate (CALCITRATE - DOSED IN MG ELEMENTAL CALCIUM) 950 MG tablet Take 200 mg of elemental calcium by mouth daily.    . clotrimazole-betamethasone (LOTRISONE) cream Apply 1 application topically 2 (two) times daily. 30 g 0  . cyclobenzaprine (FLEXERIL) 10 MG tablet Take 1 tablet (10 mg total) by mouth 3 (three) times daily as needed for muscle spasms. 90 tablet 0  . ezetimibe (ZETIA) 10 MG tablet Take 10 mg by mouth daily.    . Ferrous Sulfate (IRON) 325 (65 Fe) MG TABS Take 325 mg by mouth 2 (two) times daily. 60 each 12  . glucosamine-chondroitin 500-400 MG tablet Take 1 tablet by mouth 3 (three) times daily.    Marland Kitchen ibuprofen (ADVIL,MOTRIN) 200 MG tablet Take by mouth.    . Multiple Vitamins-Minerals (MULTIVITAMIN WITH MINERALS) tablet Take 1 tablet by mouth daily.    . Omega-3 Fatty Acids (FISH OIL BURP-LESS) 1000 MG CAPS Take by mouth.    . ORACEA 40 MG capsule     .  psyllium (REGULOID) 0.52 G capsule Take 0.52 g by mouth daily.    . ranitidine (ZANTAC) 150 MG tablet Take 1 tablet (150 mg total) by mouth 2 (two) times daily. 60 tablet 12  . spironolactone (ALDACTONE) 50 MG tablet     . WELCHOL 625 MG tablet Take 1 tablet by mouth daily.    . polyethylene glycol powder (GLYCOLAX/MIRALAX) powder 255 grams one bottle for colonoscopy prep 255 g 0   No current facility-administered medications for this visit.    Review of Systems Review of Systems  Constitutional: Negative.   Respiratory: Negative.   Cardiovascular: Negative.   Gastrointestinal: Positive for constipation.    Blood pressure 142/70, pulse 80, resp. rate 12, height 5\' 5"  (1.651 m), weight 194 lb (87.998 kg).  Physical Exam Physical Exam  Constitutional: She is oriented to person, place, and time. She appears well-developed and well-nourished.  Eyes: Conjunctivae are normal. No scleral icterus.  Cardiovascular: Normal  rate, regular rhythm and normal heart sounds.   Pulmonary/Chest: Effort normal and breath sounds normal.  Abdominal: Soft. Normal appearance and bowel sounds are normal. There is no tenderness.  Lymphadenopathy:    She has no cervical adenopathy.  Neurological: She is alert and oriented to person, place, and time.  Skin: Skin is warm and dry.    Data Reviewed Notes reviewed Recent labs showed hemoglobin of 8 with microcytic indices. Normal WBC and platelets.  Iron indices were low  Assessment  Iron deficiency anemia     Plan   Colonoscopy and upper endoscopy,with possible biopsy/polypectomy prn: Information regarding the procedure, including its potential risks and complications (including but not limited to perforation of the bowel, which may require emergency surgery to repair, and bleeding) was verbally given to the patient. Educational information regarding lower intestinal endoscopy was given to the patient. Written instructions for how to complete the bowel prep using Miralax were provided. The importance of drinking ample fluids to avoid dehydration as a result of the prep emphasized.   .  Patient has been scheduled for an upper and lower endoscopy on 12-08-15 at [facility]. This patient has been asked to discontinue fish oil one week prior to procedure.      PCP:  Rosanna Randy  This information has been scribed by [name].     Blannie Shedlock G 11/18/2015, 2:54 PM

## 2015-11-18 NOTE — Therapy (Signed)
[place] [address], [place], [address] Phone: [phone]   Fax:  [phone]  Physical Therapy Treatment  Patient Details  Name: Claire Kane MRN: [id number] Date of Birth: [birth year] Referring Provider: Louetta Hollingshead Aschoff, MD  Encounter Date: 11/18/2015      PT End of Session - 11/18/15 1117    Visit Number 5   Number of Visits 9   Date for PT Re-Evaluation 12/03/15   Authorization Type 5   Authorization Time Period of 10   PT Start Time 1117   PT Stop Time 1201   PT Time Calculation (min) 44 min   Activity Tolerance Patient tolerated treatment well   Behavior During Therapy [facility] for tasks assessed/performed      Past Medical History  Diagnosis Date  . Heart murmur 2001  . Arthritis   . 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 complications   . High cholesterol   . Rosacea   . 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 Stab pheblectomy   2010  . Vein closure procedure Bilateral 2008  . Abdominal hysterectomy    . Cholecystectomy    . Breast surgery Right 2002    lumpectomy  . Eye surgery Right 2014  . Colonoscopy  2013  . Breast excisional biopsy Right     positive 2002  . Breast cyst aspiration Right     2003  . Post radiation therapy Right     right breast cancer 2002    There were no vitals filed for this visit.  Visit Diagnosis:  Bilateral thoracic back pain  Bilateral low back pain without sciatica  Weakness      Subjective Assessment - 11/18/15 1121    Subjective 4/10 mid back pain this morning. Felt better with movement. Not bothering her currently.    Pertinent History Bilateral thoracic back pain. Pt states that her back pain has improved since yesterday. Back spasms have gone away but still feels an ache in her back. Pt states having back spasms for years, usually occurs during the holidays. Pt states  doing a lot of chores and cooking. Back pain usually eases off with her heating pad. Back pain worsened during the holidays this year but had difficulty alleviating it.  Heating pad did not really work this time around. Back spasm usually occur first. When her back improves, the symptoms become an ache. Pt states having occasional constipation. Pt denies saddle anesthesia. Feels stiffness in low back.    Patient Stated Goals Just to get rid of this pain.    Currently in Pain? No/denies   Pain Score 0-No pain   Multiple Pain Sites No       Objectives  There-ex  Seated bilateral scapular retraction 10x5 seconds Seated R scapular retraction 10x2 with 5 second holds Chin tucks 10x3 with 5 second holds Seated thoracic extension at chair 10x3 with 5 second holds (reviewed and given as part of her HEP; pt demonstrated and verbalized understanding). Decreased thoracic back discomfort in sitting Supine open books shoulders in about 80 degrees horizontal abduction 10x3 with 5 second holds Seated transversus abdominis and pelvic floor contraction 10x2 with 5 second holds Reviewed HEP. Pt verbalized understanding.   Improved exercise technique, movement at target joints, use of target muscles after mod verbal, visual, tactile cues.     Manual therapy   Palpation: R  rotated upper thoracic spine  STM to L rhomboid major and minor muscles (to decrease tension)                      PT Education - 11/18/15 1145    Education provided Yes   Education Details ther-ex, HEP   Person(s) Educated Patient   Methods Explanation;Demonstration;Tactile cues;Verbal cues;Handout   Comprehension Verbalized understanding;Returned demonstration             PT Long Term Goals - 11/05/15 2213    PT LONG TERM GOAL #1   Title Patient will have a decrease in back pain to 5/10 or less at worst to promote ability to turn in bed at night   Time 4   Period Weeks   Status New   PT LONG TERM  GOAL #2   Title Patient will improve bilateral hip IR ROM by at least 10 degrees to promote ability to perform functional tasks with less back pain.   Time 4   Period Weeks   Status New   PT LONG TERM GOAL #3   Title Patient will improve bilateral hip strength by 1/2 MMT grade to promote ability to perform functional tasks with less back pain.    Time 4   Period Weeks   Status New               Plan - 11/18/15 1145    Clinical Impression Statement Slight mid back discomfort with prolonged sitting which was alleviated with thoracic extension exercises.    Pt will benefit from skilled therapeutic intervention in order to improve on the following deficits Pain;Decreased strength;Postural dysfunction;Decreased range of motion   Rehab Potential Good   Clinical Impairments Affecting Rehab Potential age, chronicity of condition.    PT Frequency 2x / week   PT Duration 4 weeks   PT Treatment/Interventions Manual techniques;Therapeutic activities;Therapeutic exercise;Electrical Stimulation;Aquatic Therapy;Neuromuscular re-education;Ultrasound;Patient/family education   PT Next Visit Plan posture, core strengthening, hip strengthening, hip ROM   Consulted and Agree with Plan of Care Patient        Problem List Patient Active Problem List   Diagnosis Date Noted  . Cataract 11/04/2015  . Clinical depression 11/04/2015  . Dyslipidemia 11/04/2015  . 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 Acid reflux 11/04/2015  . Adiposity 11/04/2015  . 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 Awareness of heartbeats 11/04/2015  . Depression 08/19/2015  . Fibrocystic breast disease 08/19/2015  . Carotid stenosis 08/19/2015  . Osteoarthritis 08/19/2015  . Obesity 08/19/2015  . Hyperlipidemia 08/19/2015  . 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 Heart murmur, systolic 123456  . History of breast cancer,  DCIS right breast, 2002 07/25/2013  . 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 Other specified symptoms and signs involving the circulatory and respiratory systems 06/17/2011  . Acid indigestion 06/17/2011  . Gout 06/17/2011    Joneen Boers PT, DPT   11/18/2015, 12:06 PM  [place] [facility] [address] [place], [address] Phone: [phone]   Fax:  [phone]  Name: Claire Kane MRN: [id number] Date of Birth: [birth year]

## 2015-11-19 LAB — CBC WITH DIFFERENTIAL/PLATELET
Basophils Absolute: 0 10*3/uL (ref 0.0–0.2)
Basos: 0 %
EOS (ABSOLUTE): 0.3 10*3/uL (ref 0.0–0.4)
EOS: 3 %
HEMATOCRIT: 27.5 % — AB (ref 34.0–46.6)
HEMOGLOBIN: 8.3 g/dL — AB (ref 11.1–15.9)
IMMATURE GRANULOCYTES: 0 %
Immature Grans (Abs): 0 10*3/uL (ref 0.0–0.1)
Lymphocytes Absolute: 3.1 10*3/uL (ref 0.7–3.1)
Lymphs: 31 %
MCH: 22.6 pg — ABNORMAL LOW (ref 26.6–33.0)
MCHC: 30.2 g/dL — ABNORMAL LOW (ref 31.5–35.7)
MCV: 75 fL — ABNORMAL LOW (ref 79–97)
MONOCYTES: 9 %
Monocytes Absolute: 0.9 10*3/uL (ref 0.1–0.9)
NEUTROS PCT: 57 %
Neutrophils Absolute: 5.4 10*3/uL (ref 1.4–7.0)
Platelets: 478 10*3/uL — ABNORMAL HIGH (ref 150–379)
RBC: 3.68 x10E6/uL — AB (ref 3.77–5.28)
RDW: 18.8 % — ABNORMAL HIGH (ref 12.3–15.4)
WBC: 9.7 10*3/uL (ref 3.4–10.8)

## 2015-11-20 NOTE — Addendum Note (Signed)
Addended by: Arnette Norris on: 11/20/2015 02:15 PM   Modules accepted: Orders

## 2015-11-23 ENCOUNTER — Ambulatory Visit: Payer: Medicare Other

## 2015-11-23 DIAGNOSIS — M546 Pain in thoracic spine: Secondary | ICD-10-CM | POA: Diagnosis not present

## 2015-11-23 DIAGNOSIS — R531 Weakness: Secondary | ICD-10-CM

## 2015-11-23 DIAGNOSIS — M545 Low back pain, unspecified: Secondary | ICD-10-CM

## 2015-11-23 NOTE — Therapy (Signed)
[facility] [address] [place], [address] Phone: [phone]   Fax:  [phone]  Physical Therapy Treatment  Patient Details  Name: Claire Kane MRN: [id number] Date of Birth: 18-Apr-1941 Referring Provider: Zenya Hickam Aschoff, MD  Encounter Date: 11/23/2015      PT End of Session - 11/23/15 1031    Visit Number 6   Number of Visits 9   Date for PT Re-Evaluation 12/03/15   Authorization Type 6   Authorization Time Period of 10   PT Start Time 1031   PT Stop Time 1116   PT Time Calculation (min) 45 min   Activity Tolerance Patient tolerated treatment well   Behavior During Therapy [facility] for tasks assessed/performed      Past Medical History  Diagnosis Date  . Heart murmur 2001  . Arthritis   . 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 complications   . High cholesterol   . Rosacea   . 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 Stab pheblectomy   2010  . Vein closure procedure Bilateral 2008  . Abdominal hysterectomy    . Cholecystectomy    . Breast surgery Right 2002    lumpectomy  . Eye surgery Right 2014  . Colonoscopy  2013  . Breast excisional biopsy Right     positive 2002  . Breast cyst aspiration Right     2003  . Post radiation therapy Right     right breast cancer 2002    There were no vitals filed for this visit.  Visit Diagnosis:  Bilateral thoracic back pain  Bilateral low back pain without sciatica  Weakness      Subjective Assessment - 11/23/15 1033    Subjective Back is better. Better able to sit. Better able to move, make the bed. 1/10 back pain at most for the past 3 days.    Pertinent History Bilateral thoracic back pain. Pt states that her back pain has improved since yesterday. Back spasms have gone away but still feels an ache in her back. Pt states having back spasms for years, usually occurs during  the holidays. Pt states doing a lot of chores and cooking. Back pain usually eases off with her heating pad. Back pain worsened during the holidays this year but had difficulty alleviating it.  Heating pad did not really work this time around. Back spasm usually occur first. When her back improves, the symptoms become an ache. Pt states having occasional constipation. Pt denies saddle anesthesia. Feels stiffness in low back.    Patient Stated Goals Just to get rid of this pain.    Currently in Pain? No/denies   Pain Score 0-No pain   Multiple Pain Sites No        Objectives  There-ex  S/L clam shells 10x3 each LE with transversus abdominis and pelvic floor contraction  Supine open books shoulders in about 80 degrees horizontal abduction 10x3 with 5 second holds Seated bilateral scapular retraction resisting green band 10x5 seconds for 3 sets Seated chin tucks 10x3 with 5 second holds  Improved exercise technique, movement at target joints, use of target muscles after mod verbal, visual, tactile cues.    Manual therapy   Palpation: R rotated upper thoracic spine  STM to L rhomboid major and minor muscles (to decrease tension)    Tolerated session well without complain of  back pain. Good hip muscle use felt with hip exercises.                 PT Education - 11/23/15 2039    Education provided Yes   Education Details ther-ex   Person(s) Educated Patient   Methods Explanation;Demonstration;Tactile cues;Verbal cues   Comprehension Verbalized understanding;Returned demonstration             PT Long Term Goals - 11/05/15 2213    PT LONG TERM GOAL #1   Title Patient will have a decrease in back pain to 5/10 or less at worst to promote ability to turn in bed at night   Time 4   Period Weeks   Status New   PT LONG TERM GOAL #2   Title Patient will improve bilateral hip IR ROM by at least 10 degrees to promote ability to perform functional tasks with less back  pain.   Time 4   Period Weeks   Status New   PT LONG TERM GOAL #3   Title Patient will improve bilateral hip strength by 1/2 MMT grade to promote ability to perform functional tasks with less back pain.    Time 4   Period Weeks   Status New               Plan - 11/23/15 1117    Clinical Impression Statement Tolerated session well without complain of back pain. Good hip muscle use felt with hip exercises.    Pt will benefit from skilled therapeutic intervention in order to improve on the following deficits Pain;Decreased strength;Postural dysfunction;Decreased range of motion   Rehab Potential Good   Clinical Impairments Affecting Rehab Potential age, chronicity of condition.    PT Frequency 2x / week   PT Duration 4 weeks   PT Treatment/Interventions Manual techniques;Therapeutic activities;Therapeutic exercise;Electrical Stimulation;Aquatic Therapy;Neuromuscular re-education;Ultrasound;Patient/family education   PT Next Visit Plan posture, core strengthening, hip strengthening, hip ROM   Consulted and Agree with Plan of Care Patient        Problem List Patient Active Problem List   Diagnosis Date Noted  . Cataract 11/04/2015  . Clinical depression 11/04/2015  . Dyslipidemia 11/04/2015  . 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 Acid reflux 11/04/2015  . Adiposity 11/04/2015  . 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 Awareness of heartbeats 11/04/2015  . Depression 08/19/2015  . Fibrocystic breast disease 08/19/2015  . Carotid stenosis 08/19/2015  . Osteoarthritis 08/19/2015  . Obesity 08/19/2015  . Hyperlipidemia 08/19/2015  . 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 Heart murmur, systolic 123456  . 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 Other specified symptoms and signs involving the circulatory and  respiratory systems 06/17/2011  . Acid indigestion 06/17/2011  . Gout 06/17/2011    Joneen Boers PT, DPT   11/23/2015, 8:44 PM  [facility] [address], [place], [address] Phone: [phone]   Fax:  [phone]  Name: Claire Kane MRN: [id number] Date of Birth: 01/22/1941

## 2015-11-25 ENCOUNTER — Ambulatory Visit: Payer: Medicare Other

## 2015-11-25 DIAGNOSIS — M545 Low back pain, unspecified: Secondary | ICD-10-CM

## 2015-11-25 DIAGNOSIS — M546 Pain in thoracic spine: Secondary | ICD-10-CM

## 2015-11-25 DIAGNOSIS — R531 Weakness: Secondary | ICD-10-CM

## 2015-11-25 NOTE — Therapy (Signed)
[place] [facility] [address], [place], [address] Phone: [phone]   Fax:  [phone]  Physical Therapy Treatment  Patient Details  Name: Claire Kane MRN: [id number] Date of Birth: [birth year] Referring Provider: Brycen Bean Aschoff, MD  Encounter Date: 11/25/2015      PT End of Session - 11/25/15 1119    Visit Number 7   Number of Visits 9   Date for PT Re-Evaluation 12/03/15   Authorization Type 7   Authorization Time Period of 10   PT Start Time 1119   PT Stop Time 1205   PT Time Calculation (min) 46 min   Activity Tolerance Patient tolerated treatment well   Behavior During Therapy [facility] for tasks assessed/performed      Past Medical History  Diagnosis Date  . Heart murmur 2001  . Arthritis   . 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 complications   . High cholesterol   . Rosacea   . 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 Stab pheblectomy   2010  . Vein closure procedure Bilateral 2008  . Abdominal hysterectomy    . Cholecystectomy    . Breast surgery Right 2002    lumpectomy  . Eye surgery Right 2014  . Colonoscopy  2013  . Breast excisional biopsy Right     positive 2002  . Breast cyst aspiration Right     2003  . Post radiation therapy Right     right breast cancer 2002    There were no vitals filed for this visit.  Visit Diagnosis:  Bilateral thoracic back pain  Bilateral low back pain without sciatica  Weakness      Subjective Assessment - 11/25/15 1121    Subjective Some tightness upper back/neck area. The pain in her back is pretty much gone. Feels like she is progressing towards her goals.    Pertinent History Bilateral thoracic back pain. Pt states that her back pain has improved since yesterday. Back spasms have gone away but still feels an ache in her back. Pt states having back spasms for years, usually  occurs during the holidays. Pt states doing a lot of chores and cooking. Back pain usually eases off with her heating pad. Back pain worsened during the holidays this year but had difficulty alleviating it.  Heating pad did not really work this time around. Back spasm usually occur first. When her back improves, the symptoms become an ache. Pt states having occasional constipation. Pt denies saddle anesthesia. Feels stiffness in low back.    Patient Stated Goals Just to get rid of this pain.    Currently in Pain? No/denies   Pain Score 0-No pain   Multiple Pain Sites No            [facility] PT Assessment - 11/25/15 1147    PROM   Overall PROM Comments R hip IR 22 degrees, L hip IR 18 degrees   Strength   Right Hip Flexion 4/5   Right Hip External Rotation  4-/5   Right Hip ABduction 5/5   Left Hip Flexion 4+/5   Left Hip External Rotation 4/5   Left Hip ABduction 4+/5       Objectives  Manual therapy    STM to L rhomboid major and minor muscles (decreased L rhomboid muscle tension afterwards)   There-ex  Seated manually resisted R scapular  retraction 10x2 with 5 second holds (slight decrease in L rhomboid tightness)   Supine open books shoulders in about 80 degrees horizontal abduction 10x3 with 5 second holds  Supine chin tucks 10x  5 second holds for 2 sets   S/L clam shells 10x3 each LE with transversus abdominis and pelvic floor contraction   S/L manually resisted hip abduction, seated hip ER, seated hip flexion 1x each way for each LE   Reviewed progress/current status with hip strength with pt.    Improved exercise technique, movement at target joints, use of target muscles after mod verbal, visual, tactile cues.     Pt making very good progress with decreased back pain. Pt also demonstrates overall improved hip strength except for R hip ER since initial evaluation. Pt tolerated session very well without complain of pain.                           PT Education - 11/25/15 1155    Education provided Yes   Education Details ther-ex   [employer]) Educated Patient   Methods Explanation;Demonstration;Tactile cues;Verbal cues   Comprehension Verbalized understanding;Returned demonstration             PT Long Term Goals - 11/25/15 1508    PT LONG TERM GOAL #1   Title Patient will have a decrease in back pain to 5/10 or less at worst to promote ability to turn in bed at night   Time 4   Period Weeks   Status Achieved   PT LONG TERM GOAL #2   Title Patient will improve bilateral hip IR ROM by at least 10 degrees to promote ability to perform functional tasks with less back pain.   Time 4   Period Weeks   Status On-going   PT LONG TERM GOAL #3   Title Patient will improve bilateral hip strength by 1/2 MMT grade to promote ability to perform functional tasks with less back pain.    Time 4   Period Weeks   Status Achieved  Improved all hip strength except R hip ER               Plan - 11/25/15 1155    Clinical Impression Statement Pt making very good progress with decreased back pain. Pt also demonstrates overall improved hip strength except for R hip ER since initial evaluation. Pt tolerated session very well without complain of pain.    Pt will benefit from skilled therapeutic intervention in order to improve on the following deficits Pain;Decreased strength;Postural dysfunction;Decreased range of motion   Rehab Potential Good   Clinical Impairments Affecting Rehab Potential age, chronicity of condition.    PT Frequency 2x / week   PT Duration 4 weeks   PT Treatment/Interventions Manual techniques;Therapeutic activities;Therapeutic exercise;Electrical Stimulation;Aquatic Therapy;Neuromuscular re-education;Ultrasound;Patient/family education   PT Next Visit Plan posture, core strengthening, hip strengthening, hip ROM   Consulted and Agree with Plan of Care Patient         Problem List Patient Active Problem List   Diagnosis Date Noted  . Cataract 11/04/2015  . Clinical depression 11/04/2015  . Dyslipidemia 11/04/2015  . 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 Acid reflux 11/04/2015  . Adiposity 11/04/2015  . 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 Awareness of heartbeats 11/04/2015  . Depression 08/19/2015  . Fibrocystic breast disease 08/19/2015  . Carotid stenosis 08/19/2015  . Osteoarthritis 08/19/2015  .  Obesity 08/19/2015  . Hyperlipidemia 08/19/2015  . 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 Heart murmur, systolic 123456  . 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 Other specified symptoms and signs involving the circulatory and respiratory systems 06/17/2011  . Acid indigestion 06/17/2011  . Gout 06/17/2011    Joneen Boers PT, DPT   11/25/2015, 3:19 PM  [facility] [address] [place], [address] Phone: [phone]   Fax:  [phone]  Name: Claire Kane MRN: [id number] Date of Birth: 1941-08-31

## 2015-11-30 ENCOUNTER — Ambulatory Visit: Payer: Medicare Other

## 2015-11-30 ENCOUNTER — Encounter: Payer: Self-pay | Admitting: General Surgery

## 2015-11-30 DIAGNOSIS — M546 Pain in thoracic spine: Secondary | ICD-10-CM | POA: Diagnosis not present

## 2015-11-30 DIAGNOSIS — R531 Weakness: Secondary | ICD-10-CM

## 2015-11-30 DIAGNOSIS — M545 Low back pain, unspecified: Secondary | ICD-10-CM

## 2015-11-30 NOTE — Therapy (Signed)
[facility] [address] [place], [address] Phone: [phone]   Fax:  [phone]  Physical Therapy Treatment  Patient Details  Name: Claire Kane MRN: [id number] Date of Birth: 04-28-1941 Referring Provider: Saige Canton Aschoff, MD  Encounter Date: 11/30/2015      PT End of Session - 11/30/15 1034    Visit Number 8   Number of Visits 9   Date for PT Re-Evaluation 12/03/15   Authorization Type 8   Authorization Time Period of 10   PT Start Time 1034   PT Stop Time 1118   PT Time Calculation (min) 44 min   Activity Tolerance Patient tolerated treatment well   Behavior During Therapy [facility] for tasks assessed/performed      Past Medical History  Diagnosis Date  . Heart murmur 2001  . Arthritis   . 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 complications   . High cholesterol   . Rosacea   . 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 Stab pheblectomy   2010  . Vein closure procedure Bilateral 2008  . Abdominal hysterectomy    . Cholecystectomy    . Breast surgery Right 2002    lumpectomy  . Eye surgery Right 2014  . Colonoscopy  2013  . Breast excisional biopsy Right     positive 2002  . Breast cyst aspiration Right     2003  . Post radiation therapy Right     right breast cancer 2002    There were no vitals filed for this visit.  Visit Diagnosis:  Bilateral thoracic back pain  Bilateral low back pain without sciatica  Weakness      Subjective Assessment - 11/30/15 1035    Subjective Thoracic back feels good. Some stiffness at neck along shoulder blades but fine. No back pain currently. L low back soreness this weekend (2/10 soreness)   Pertinent History Bilateral thoracic back pain. Pt states that her back pain has improved since yesterday. Back spasms have gone away but still feels an ache in her back. Pt states having back  spasms for years, usually occurs during the holidays. Pt states doing a lot of chores and cooking. Back pain usually eases off with her heating pad. Back pain worsened during the holidays this year but had difficulty alleviating it.  Heating pad did not really work this time around. Back spasm usually occur first. When her back improves, the symptoms become an ache. Pt states having occasional constipation. Pt denies saddle anesthesia. Feels stiffness in low back.    Patient Stated Goals Just to get rid of this pain.    Currently in Pain? No/denies   Pain Score 0-No pain   Multiple Pain Sites No          Objectives  Manual therapy    STM to L rhomboid major and minor muscles (decreased L rhomboid muscle tension afterwards)   There-ex  Standing R shoulder extension with scapular retraction 10x3 with 5 second holds resisting theraband  Supine chin tucks 10x 5 second holds for 2 sets   Supine open books shoulders in about 80 degrees horizontal abduction 10x2 with 5 second holds (reviewed and given as part of her HEP; pt demonstrated and verbalized understanding)   Supine single knee to chest 10x5 second holds each LE for 2 sets   Reviewed Plan of care (2 weeks  of HEP, call back if need more PT after 12/02/15, if not, then DC). Pt verbalized understanding.    Improved exercise technique, movement at target joints, use of target muscles after mod verbal, visual, tactile cues.       Tolerated session well without complain of pain. Pt making very good progress towards goal of decreased back pain.                         PT Education - 11/30/15 2040    Education provided Yes   Education Details ther-ex   [employer]) Educated Patient   Methods Demonstration;Explanation;Tactile cues;Verbal cues   Comprehension Verbalized understanding;Returned demonstration             PT Long Term Goals - 11/25/15 1508    PT LONG TERM GOAL #1   Title Patient will have  a decrease in back pain to 5/10 or less at worst to promote ability to turn in bed at night   Time 4   Period Weeks   Status Achieved   PT LONG TERM GOAL #2   Title Patient will improve bilateral hip IR ROM by at least 10 degrees to promote ability to perform functional tasks with less back pain.   Time 4   Period Weeks   Status On-going   PT LONG TERM GOAL #3   Title Patient will improve bilateral hip strength by 1/2 MMT grade to promote ability to perform functional tasks with less back pain.    Time 4   Period Weeks   Status Achieved  Improved all hip strength except R hip ER               Plan - 11/30/15 2040    Clinical Impression Statement Tolerated session well without complain of pain. Pt making very good progress towards goal of decreased back pain.    Pt will benefit from skilled therapeutic intervention in order to improve on the following deficits Pain;Decreased strength;Postural dysfunction;Decreased range of motion   Rehab Potential Good   Clinical Impairments Affecting Rehab Potential age, chronicity of condition.    PT Frequency 2x / week   PT Duration 4 weeks   PT Treatment/Interventions Manual techniques;Therapeutic activities;Therapeutic exercise;Electrical Stimulation;Aquatic Therapy;Neuromuscular re-education;Ultrasound;Patient/family education   PT Next Visit Plan posture, core strengthening, hip strengthening, hip ROM   Consulted and Agree with Plan of Care Patient        Problem List Patient Active Problem List   Diagnosis Date Noted  . Cataract 11/04/2015  . Clinical depression 11/04/2015  . Dyslipidemia 11/04/2015  . 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 Acid reflux 11/04/2015  . Adiposity 11/04/2015  . 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 Awareness of heartbeats 11/04/2015  . Depression 08/19/2015  . Fibrocystic breast disease 08/19/2015  . Carotid  stenosis 08/19/2015  . Osteoarthritis 08/19/2015  . Obesity 08/19/2015  . Hyperlipidemia 08/19/2015  . 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 Heart murmur, systolic 123456  . 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 Other specified symptoms and signs involving the circulatory and respiratory systems 06/17/2011  . Acid indigestion 06/17/2011  . Gout 06/17/2011     Joneen Boers PT, DPT  11/30/2015, 8:46 PM  [facility] [address], [place], [address] Phone: [phone]   Fax:  [phone]  Name: Claire Kane MRN: [id number] Date of Birth: Oct 04, 1940

## 2015-11-30 NOTE — Patient Instructions (Signed)
Supine open books shoulders in about 80 degrees horizontal abduction 10x2 with 5 second holds (reviewed and given as part of her HEP; pt demonstrated and verbalized understanding)

## 2015-12-01 ENCOUNTER — Ambulatory Visit (INDEPENDENT_AMBULATORY_CARE_PROVIDER_SITE_OTHER): Payer: Medicare Other | Admitting: Family Medicine

## 2015-12-01 VITALS — BP 142/54 | HR 68 | Temp 97.7°F | Resp 16 | Wt 196.0 lb

## 2015-12-01 DIAGNOSIS — D649 Anemia, unspecified: Secondary | ICD-10-CM | POA: Diagnosis not present

## 2015-12-01 NOTE — Progress Notes (Signed)
Patient ID: Claire Kane, female   DOB: 14-Mar-1941, 75 y.o.   MRN: [id number]   Claire Kane  MRN: [id number] DOB: [birth year]  Subjective:  HPI  1. Anemia, unspecified anemia type The patient is a 75 year old female who presents today for follow up of her anemia.  She has had consult with Dr. Jamal Collin and is scheduled for Endoscopy and Colonoscopy next week.  She has been taking the Iron as prescribed.  The patient notes that she is currently not taking her Aspirin or Spironalactone, Ibuprofen or Aleve.  She reported that she had been on Ibuprofen and Aleve due to her back pain.  She states she never exceeded the maximum dose but she may have been taking it longer than she should.  Patient Active Problem List   Diagnosis Date Noted  . Cataract 11/04/2015  . Clinical depression 11/04/2015  . Dyslipidemia 11/04/2015  . 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 Acid reflux 11/04/2015  . Adiposity 11/04/2015  . 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 Awareness of heartbeats 11/04/2015  . Depression 08/19/2015  . Fibrocystic breast disease 08/19/2015  . Carotid stenosis 08/19/2015  . Osteoarthritis 08/19/2015  . Obesity 08/19/2015  . Hyperlipidemia 08/19/2015  . 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 Heart murmur, systolic 123456  . 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 Other specified symptoms and signs involving the circulatory and respiratory systems 06/17/2011  . Acid indigestion 06/17/2011  . Gout 06/17/2011    Past Medical History  Diagnosis Date  . Heart murmur 2001  . Arthritis   . 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 complications   . High cholesterol   . Rosacea   . 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 Marital Status: Married    Spouse Name: N/A  .  Number of Children: N/A  . Years of Education: N/A   Occupational History  . Not on file.   Social History Main Topics  . Smoking status: Never Smoker   . Smokeless tobacco: Never Used  . Alcohol Use: No  . Drug Use: No  . Sexual Activity: No   Other Topics Concern  . Not on file   Social History Narrative    Outpatient Prescriptions Prior to Visit  Medication Sig Dispense Refill  . atorvastatin (LIPITOR) 10 MG tablet Take 10 mg by mouth daily.    . calcium citrate (CALCITRATE - DOSED IN MG ELEMENTAL CALCIUM) 950 MG tablet Take 200 mg of elemental calcium by mouth daily.    . clotrimazole-betamethasone (LOTRISONE) cream Apply 1 application topically 2 (two) times daily. 30 g 0  . cyclobenzaprine (FLEXERIL) 10 MG tablet Take 1 tablet (10 mg total) by mouth 3 (three) times daily as needed for muscle spasms. 90 tablet 0  . ezetimibe (ZETIA) 10 MG tablet Take 10 mg by mouth daily.    . Ferrous Sulfate (IRON) 325 (65 Fe) MG TABS Take 325 mg by mouth 2 (two) times daily. 60 each 12  . glucosamine-chondroitin 500-400 MG tablet Take 1 tablet by mouth 3 (three) times daily.    Marland Kitchen ibuprofen (ADVIL,MOTRIN) 200 MG tablet Take by mouth.    . Multiple Vitamins-Minerals (MULTIVITAMIN WITH MINERALS) tablet Take 1 tablet by mouth daily.    . Omega-3 Fatty Acids (FISH OIL BURP-LESS) 1000  MG CAPS Take by mouth.    . ORACEA 40 MG capsule     . polyethylene glycol powder (GLYCOLAX/MIRALAX) powder 255 grams one bottle for colonoscopy prep 255 g 0  . psyllium (REGULOID) 0.52 G capsule Take 0.52 g by mouth daily.    . ranitidine (ZANTAC) 150 MG tablet Take 1 tablet (150 mg total) by mouth 2 (two) times daily. 60 tablet 12  . spironolactone (ALDACTONE) 50 MG tablet     . WELCHOL 625 MG tablet Take 1 tablet by mouth daily.    Marland Kitchen aspirin 81 MG tablet Take 81 mg by mouth daily. Reported on 12/01/2015     No facility-administered medications prior to visit.    No Known Allergies  Review of Systems    Constitutional: Positive for malaise/fatigue (Slight improvement while taking iron). Negative for fever and chills.  Eyes: Negative.   Respiratory: Positive for shortness of breath (very slight and much improved from her last visit.). Negative for cough and wheezing.   Cardiovascular: Negative for chest pain, palpitations, orthopnea and leg swelling.  Gastrointestinal: Negative.   Neurological: Negative for weakness.  Endo/Heme/Allergies: Does not bruise/bleed easily.  Psychiatric/Behavioral: Negative.    Objective:  BP 142/54 mmHg  Pulse 68  Temp(Src) 97.7 F (36.5 C) (Oral)  Resp 16  Wt 196 lb (88.905 kg)  Physical Exam  Constitutional: She is oriented to person, place, and time and well-developed, well-nourished, and in no distress.  HENT:  Head: Normocephalic and atraumatic.  Right Ear: External ear normal.  Left Ear: External ear normal.  Nose: Nose normal.  Eyes: Conjunctivae are normal.  Neck: Neck supple.  Cardiovascular: Normal rate.   Pulmonary/Chest: Effort normal and breath sounds normal.  Abdominal: Soft.  Neurological: She is alert and oriented to person, place, and time.  Skin: Skin is warm and dry.  Psychiatric: Mood, memory, affect and judgment normal.    Assessment and Plan :  Anemia, unspecified anemia type EGD/colonoscopy complete. Consider hematology referral. Dyslipidemia Breast cancer/DCIS 2002 I have done the exam and reviewed the above chart and it is accurate to the best of my knowledge.   Miguel Aschoff MD [facility] 12/01/2015 11:38 AM

## 2015-12-02 ENCOUNTER — Ambulatory Visit: Payer: Medicare Other

## 2015-12-02 ENCOUNTER — Telehealth: Payer: Self-pay

## 2015-12-02 DIAGNOSIS — M546 Pain in thoracic spine: Secondary | ICD-10-CM | POA: Diagnosis not present

## 2015-12-02 DIAGNOSIS — M545 Low back pain, unspecified: Secondary | ICD-10-CM

## 2015-12-02 DIAGNOSIS — R531 Weakness: Secondary | ICD-10-CM

## 2015-12-02 LAB — CBC WITH DIFFERENTIAL/PLATELET
BASOS ABS: 0 10*3/uL (ref 0.0–0.2)
BASOS: 0 %
EOS (ABSOLUTE): 0.2 10*3/uL (ref 0.0–0.4)
Eos: 2 %
HEMATOCRIT: 36.4 % (ref 34.0–46.6)
Hemoglobin: 10.7 g/dL — ABNORMAL LOW (ref 11.1–15.9)
IMMATURE GRANS (ABS): 0 10*3/uL (ref 0.0–0.1)
IMMATURE GRANULOCYTES: 0 %
LYMPHS: 24 %
Lymphocytes Absolute: 2.1 10*3/uL (ref 0.7–3.1)
MCH: 24.3 pg — ABNORMAL LOW (ref 26.6–33.0)
MCHC: 29.4 g/dL — AB (ref 31.5–35.7)
MCV: 83 fL (ref 79–97)
MONOS ABS: 0.7 10*3/uL (ref 0.1–0.9)
Monocytes: 8 %
NEUTROS ABS: 5.9 10*3/uL (ref 1.4–7.0)
NEUTROS PCT: 66 %
PLATELETS: 498 10*3/uL — AB (ref 150–379)
RBC: 4.4 x10E6/uL (ref 3.77–5.28)
RDW: 24.2 % — AB (ref 12.3–15.4)
WBC: 8.9 10*3/uL (ref 3.4–10.8)

## 2015-12-02 NOTE — Patient Instructions (Signed)
Scapular Retraction: Rowing (Eccentric) - Arms - 45 Degrees (Resistance Band)   R side only  Hold end of band in right hand. Pull back until arm is even with trunk. Squeeze shoulder blade.  Hold for 5 seconds.  Use __red______ resistance band. _10__ reps per set, _3__ sets per day. Copyright  VHI. All rights reserved.

## 2015-12-02 NOTE — Telephone Encounter (Signed)
-----   Message from Jerrol Banana., MD sent at 12/02/2015  8:45 AM EDT ----- CBC much better.

## 2015-12-02 NOTE — Progress Notes (Signed)
LMTCB

## 2015-12-02 NOTE — Therapy (Signed)
[facility] [address], [place], [address] Phone: [phone]   Fax:  [phone]  Physical Therapy Treatment  Patient Details  Name: Claire Kane MRN: [id number] Date of Birth: August 28, 1941 Referring Provider: Nikki Rusnak Aschoff, MD  Encounter Date: 12/02/2015      PT End of Session - 12/02/15 1040    Visit Number 9   Number of Visits 9   Date for PT Re-Evaluation 12/03/15   Authorization Type 9   Authorization Time Period of 10   PT Start Time 1040   PT Stop Time 1112   PT Time Calculation (min) 32 min   Activity Tolerance Patient tolerated treatment well   Behavior During Therapy [facility] for tasks assessed/performed      Past Medical History  Diagnosis Date  . Heart murmur 2001  . Arthritis   . 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 complications   . High cholesterol   . Rosacea   . 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 Stab pheblectomy   2010  . Vein closure procedure Bilateral 2008  . Abdominal hysterectomy    . Cholecystectomy    . Breast surgery Right 2002    lumpectomy  . Eye surgery Right 2014  . Colonoscopy  2013  . Breast excisional biopsy Right     positive 2002  . Breast cyst aspiration Right     2003  . Post radiation therapy Right     right breast cancer 2002    There were no vitals filed for this visit.  Visit Diagnosis:  Bilateral thoracic back pain  Bilateral low back pain without sciatica  Weakness      Subjective Assessment - 12/02/15 1041    Subjective No thoracic or low back pain. Just some neck stiffness   Pertinent History Bilateral thoracic back pain. Pt states that her back pain has improved since yesterday. Back spasms have gone away but still feels an ache in her back. Pt states having back spasms for years, usually occurs during the holidays. Pt states doing a lot of chores and cooking.  Back pain usually eases off with her heating pad. Back pain worsened during the holidays this year but had difficulty alleviating it.  Heating pad did not really work this time around. Back spasm usually occur first. When her back improves, the symptoms become an ache. Pt states having occasional constipation. Pt denies saddle anesthesia. Feels stiffness in low back.    Patient Stated Goals Just to get rid of this pain.    Currently in Pain? No/denies   Multiple Pain Sites No            [facility] PT Assessment - 12/02/15 0001    PROM   Overall PROM Comments L hip IR 25 degrees, R hip IR 22 degrees   Strength   Right Hip External Rotation  4/5          Objectives  Manual therapy    STM to L rhomboid major and minor muscles (decreased neck muscle tension/stiffness afterwards)   There-ex  Directed patient with supine open books, shoulders in about 80 degrees horizontal abduction 10x 10 second holds Supine chin tucks 10x5 second holds   Standing R shoulder extension with scapular retraction 10x3 with 5 second holds resisting red theraband (decreased neck stiffness)    Improved exercise technique, movement at target joints,  use of target muscles after min to mod verbal, visual, tactile cues.      Decreased neck/upper thoracic stiffness after session. Patient has demonstrated decreased back pain and improved bilateral hip strength since initial evaluation and has made very good progress towards goals. Skilled physical therapy services potentially discharged with patient continuing progress with her home exercises. Pt to call back in 2 weeks to schedule a follow-up if she feels like she needs more sessions.  Pt verbalized understanding.                PT Education - 12/02/15 1120    Education provided Yes   Education Details ther-ex, HEP   Person(s) Educated Patient   Methods Explanation;Demonstration;Tactile cues;Verbal cues;Handout   Comprehension Verbalized  understanding;Returned demonstration             PT Long Term Goals - 12/02/15 1042    PT LONG TERM GOAL #1   Title Patient will have a decrease in back pain to 5/10 or less at worst to promote ability to turn in bed at night   Time 4   Period Weeks   Status Achieved   PT LONG TERM GOAL #2   Title Patient will improve bilateral hip IR ROM by at least 10 degrees to promote ability to perform functional tasks with less back pain.   Time 4   Period Weeks   Status On-going   PT LONG TERM GOAL #3   Title Patient will improve bilateral hip strength by 1/2 MMT grade to promote ability to perform functional tasks with less back pain.    Time 4   Period Weeks   Status Achieved               Plan - 12/02/15 1113    Clinical Impression Statement Decreased neck/upper thoracic stiffness after session. Patient has demonstrated decreased back pain and improved bilateral hip strength since initial evaluation and has made very good progress towards goals. Skilled physical therapy services potentially discharged with patient continuing progress with her home exercises. Pt to call back in 2 weeks to schedule a follow-up if she feels like she needs more sessions.  Pt verbalized understanding.    Pt will benefit from skilled therapeutic intervention in order to improve on the following deficits Pain;Decreased strength;Postural dysfunction;Decreased range of motion   Rehab Potential Good   Clinical Impairments Affecting Rehab Potential age, chronicity of condition.    PT Frequency 2x / week   PT Duration 4 weeks   PT Treatment/Interventions Manual techniques;Therapeutic activities;Therapeutic exercise;Electrical Stimulation;Aquatic Therapy;Neuromuscular re-education;Ultrasound;Patient/family education   PT Next Visit Plan posture, core strengthening, hip strengthening, hip ROM   Consulted and Agree with Plan of Care Patient        Problem List Patient Active Problem List   Diagnosis  Date Noted  . Cataract 11/04/2015  . Clinical depression 11/04/2015  . Dyslipidemia 11/04/2015  . 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 Acid reflux 11/04/2015  . Adiposity 11/04/2015  . 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 Awareness of heartbeats 11/04/2015  . Depression 08/19/2015  . Fibrocystic breast disease 08/19/2015  . Carotid stenosis 08/19/2015  . Osteoarthritis 08/19/2015  . Obesity 08/19/2015  . Hyperlipidemia 08/19/2015  . 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 Heart murmur, systolic 123456  . 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 Other specified symptoms and signs involving the circulatory  and respiratory systems 06/17/2011  . Acid indigestion 06/17/2011  . Gout 06/17/2011   Thank you for your referral.  Joneen Boers PT, DPT   12/02/2015, 8:19 PM  [facility] [address], [place], [address] Phone: [phone]   Fax:  [phone]  Name: Claire Kane MRN: [id number] Date of Birth: 15-Sep-1941

## 2015-12-02 NOTE — Telephone Encounter (Signed)
Pt is returning call.  CB#[phone]/MW

## 2015-12-02 NOTE — Telephone Encounter (Signed)
LMTCB

## 2015-12-03 NOTE — Telephone Encounter (Signed)
Pt advised-aa 

## 2015-12-07 ENCOUNTER — Encounter: Payer: Self-pay | Admitting: *Deleted

## 2015-12-08 ENCOUNTER — Ambulatory Visit
Admission: RE | Admit: 2015-12-08 | Discharge: 2015-12-08 | Disposition: A | Discharge status: Home / Self Care | Payer: Medicare Other | Source: Ambulatory Visit | Attending: General Surgery | Admitting: General Surgery

## 2015-12-08 ENCOUNTER — Ambulatory Visit: Payer: Medicare Other | Admitting: Anesthesiology

## 2015-12-08 ENCOUNTER — Encounter
Admission: RE | Disposition: A | Discharge status: Home / Self Care | Payer: Self-pay | Source: Ambulatory Visit | Attending: General Surgery

## 2015-12-08 DIAGNOSIS — R109 Unspecified abdominal pain: Secondary | ICD-10-CM | POA: Diagnosis not present

## 2015-12-08 DIAGNOSIS — Z853 Personal history of malignant neoplasm of breast: Secondary | ICD-10-CM | POA: Diagnosis not present

## 2015-12-08 DIAGNOSIS — R011 Cardiac murmur, unspecified: Secondary | ICD-10-CM | POA: Diagnosis not present

## 2015-12-08 DIAGNOSIS — K219 Gastro-esophageal reflux disease without esophagitis: Secondary | ICD-10-CM | POA: Diagnosis not present

## 2015-12-08 DIAGNOSIS — D509 Iron deficiency anemia, unspecified: Secondary | ICD-10-CM | POA: Insufficient documentation

## 2015-12-08 DIAGNOSIS — E7800 Pure hypercholesterolemia, unspecified: Secondary | ICD-10-CM | POA: Diagnosis not present

## 2015-12-08 DIAGNOSIS — K449 Diaphragmatic hernia without obstruction or gangrene: Secondary | ICD-10-CM | POA: Diagnosis not present

## 2015-12-08 DIAGNOSIS — K5900 Constipation, unspecified: Secondary | ICD-10-CM | POA: Diagnosis not present

## 2015-12-08 DIAGNOSIS — K3189 Other diseases of stomach and duodenum: Secondary | ICD-10-CM | POA: Diagnosis not present

## 2015-12-08 DIAGNOSIS — K5730 Diverticulosis of large intestine without perforation or abscess without bleeding: Secondary | ICD-10-CM | POA: Insufficient documentation

## 2015-12-08 DIAGNOSIS — M1990 Unspecified osteoarthritis, unspecified site: Secondary | ICD-10-CM | POA: Diagnosis not present

## 2015-12-08 DIAGNOSIS — I739 Peripheral vascular disease, unspecified: Secondary | ICD-10-CM | POA: Insufficient documentation

## 2015-12-08 DIAGNOSIS — Z9049 Acquired absence of other specified parts of digestive tract: Secondary | ICD-10-CM | POA: Insufficient documentation

## 2015-12-08 DIAGNOSIS — K644 Residual hemorrhoidal skin tags: Secondary | ICD-10-CM | POA: Insufficient documentation

## 2015-12-08 DIAGNOSIS — Z90710 Acquired absence of both cervix and uterus: Secondary | ICD-10-CM | POA: Insufficient documentation

## 2015-12-08 HISTORY — PX: COLONOSCOPY WITH PROPOFOL: SHX5780

## 2015-12-08 HISTORY — PX: ESOPHAGOGASTRODUODENOSCOPY (EGD) WITH PROPOFOL: SHX5813

## 2015-12-08 SURGERY — COLONOSCOPY WITH PROPOFOL
Anesthesia: General

## 2015-12-08 MED ORDER — PROPOFOL 500 MG/50ML IV EMUL
INTRAVENOUS | Status: DC | PRN
  Administered 2015-12-08: 50 ug/kg/min via INTRAVENOUS

## 2015-12-08 MED ORDER — SODIUM CHLORIDE 0.9 % IV SOLN
INTRAVENOUS | Status: DC
  Administered 2015-12-08: 1000 mL via INTRAVENOUS

## 2015-12-08 NOTE — Op Note (Signed)
[facility] Patient Name: Claire Kane Procedure Date: 12/08/2015 1:04 PM MRN: [id number] Account #: [id number] Date of Birth: 1940/09/24 Admit Type: Outpatient Age: 75 Room: [facility] ENDO ROOM 4 Gender: Female Note Status: Finalized Procedure:            Upper GI endoscopy Indications:          Iron deficiency anemia Providers:            Seeplaputhur G. Jamal Collin, MD Referring MD:         Janine Ores. Rosanna Randy, MD (Referring MD) Medicines:            General Anesthesia Complications:        No immediate complications. Procedure:            Pre-Anesthesia Assessment:                       - General anesthesia under the supervision of an                        anesthesiologist was determined to be medically                        necessary for this procedure based on review of the                        patient's medical history, medications, and prior                        anesthesia history.                       After obtaining informed consent, the endoscope was                        passed under direct vision. Throughout the procedure,                        the patient's blood pressure, pulse, and oxygen                        saturations were monitored continuously. The                        Colonoscope was introduced through the mouth, and                        advanced to the second part of duodenum. The upper GI                        endoscopy was accomplished without difficulty. The                        patient tolerated the procedure well. Findings:      The examined duodenum was normal.      A large hiatal hernia was present.      Patchy mildly erythematous mucosa was found in the gastric antrum.       Biopsies were taken with a cold forceps for histology.      The exam was otherwise without abnormality. Impression:           - Normal examined duodenum.                       -  Large hiatal hernia.                       - Erythematous  mucosa in the antrum. Biopsied.                       - The examination was otherwise normal. Recommendation:       - Perform a colonoscopy today. Procedure Code(s):    --- Professional ---                       [phone], Esophagogastroduodenoscopy, flexible, transoral;                        with biopsy, single or multiple Diagnosis Code(s):    --- Professional ---                       K44.9, Diaphragmatic hernia without obstruction or                        gangrene                       K31.89, Other diseases of stomach and duodenum                       D50.9, Iron deficiency anemia, unspecified CPT copyright 2016 American Medical Association. All rights reserved. The codes documented in this report are preliminary and upon coder review may  be revised to meet current compliance requirements. Christene Lye, MD 12/08/2015 1:33:16 PM This report has been signed electronically. Number of Addenda: 0 Note Initiated On: 12/08/2015 1:04 PM      [facility]

## 2015-12-08 NOTE — Anesthesia Preprocedure Evaluation (Signed)
Anesthesia Evaluation  Patient identified by MRN, date of birth, ID band Patient awake    Reviewed: Allergy & Precautions, H&P , NPO status , Patient's Chart, lab work & pertinent test results, reviewed documented beta blocker date and time   Airway Mallampati: III   Neck ROM: full    Dental  (+) Poor Dentition, Teeth Intact   Pulmonary neg pulmonary ROS,    Pulmonary exam normal        Cardiovascular + Peripheral Vascular Disease  negative cardio ROS Normal cardiovascular exam     Neuro/Psych PSYCHIATRIC DISORDERS negative neurological ROS  negative psych ROS   GI/Hepatic negative GI ROS, Neg liver ROS, GERD  ,  Endo/Other  negative endocrine ROS  Renal/GU negative Renal ROS  negative genitourinary   Musculoskeletal   Abdominal   Peds  Hematology negative hematology ROS (+)   Anesthesia Other Findings Past Medical History:   Heart murmur                                    2001         Arthritis                                                    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*              High cholesterol                                             Rosacea                                                      Cancer ([place])                                    2002           Comment:DCIS right breast  Past Surgical History:   Stab pheblectomy                                 2010         vein closure procedure                          Bilateral 2008         ABDOMINAL HYSTERECTOMY                                        CHOLECYSTECTOMY  BREAST SURGERY                                  Right 2002           Comment:lumpectomy   EYE SURGERY                                     Right 2014         COLONOSCOPY                                      2013         BREAST EXCISIONAL BIOPSY                        Right                Comment:positive 2002   BREAST CYST  ASPIRATION                          Right                Comment:2003   post radiation therapy                          Right                Comment:right breast cancer 2002   Reproductive/Obstetrics negative OB ROS                             Anesthesia Physical Anesthesia Plan  ASA: III  Anesthesia Plan: General   Post-op Pain Management:    Induction:   Airway Management Planned:   Additional Equipment:   Intra-op Plan:   Post-operative Plan:   Informed Consent: I have reviewed the patients History and Physical, chart, labs and discussed the procedure including the risks, benefits and alternatives for the proposed anesthesia with the patient or authorized representative who has indicated his/her understanding and acceptance.   Dental Advisory Given  Plan Discussed with: CRNA  Anesthesia Plan Comments:         Anesthesia Quick Evaluation

## 2015-12-08 NOTE — Transfer of Care (Signed)
Immediate Anesthesia Transfer of Care Note  Patient: Claire Kane  Procedure(s) Performed: Procedure(s): COLONOSCOPY WITH PROPOFOL (N/A) ESOPHAGOGASTRODUODENOSCOPY (EGD) WITH PROPOFOL (N/A)  Patient Location: PACU  Anesthesia Type:General  Level of Consciousness: awake, alert  and oriented  Airway & Oxygen Therapy: Patient Spontanous Breathing  Post-op Assessment: Report given to RN and Post -op Vital signs reviewed and stable  Post vital signs: Reviewed and stable  Last Vitals:  Filed Vitals:   12/08/15 1240  BP: 147/78  Pulse: 87  Temp: 36.5 C  Resp: 20    Complications: No apparent anesthesia complications

## 2015-12-08 NOTE — Anesthesia Postprocedure Evaluation (Signed)
Anesthesia Post Note  Patient: Claire Kane  Procedure(s) Performed: Procedure(s) (LRB): COLONOSCOPY WITH PROPOFOL (N/A) ESOPHAGOGASTRODUODENOSCOPY (EGD) WITH PROPOFOL (N/A)  Patient location during evaluation: PACU Anesthesia Type: General Level of consciousness: awake and alert and oriented Pain management: pain level controlled Vital Signs Assessment: post-procedure vital signs reviewed and stable Respiratory status: spontaneous breathing and respiratory function stable Cardiovascular status: blood pressure returned to baseline Anesthetic complications: no    Last Vitals:  Filed Vitals:   12/08/15 1430 12/08/15 1440  BP: 119/64 120/76  Pulse: 79 78  Temp:    Resp: 16 22    Last Pain: There were no vitals filed for this visit.               Meoshia Billing

## 2015-12-08 NOTE — Op Note (Signed)
[facility] Gastroenterology Patient Name: Claire Kane Procedure Date: 12/08/2015 1:04 PM MRN: [id number] Account #: [id number] Date of Birth: 1941-06-06 Admit Type: Outpatient Age: 75 Room: [facility] ENDO ROOM 4 Gender: Female Note Status: Finalized Procedure:            Colonoscopy Indications:          Iron deficiency anemia Providers:            Seeplaputhur G. Jamal Collin, MD Referring MD:         Janine Ores. Rosanna Randy, MD (Referring MD) Medicines:            General Anesthesia Complications:        No immediate complications. Procedure:            Pre-Anesthesia Assessment:                       - General anesthesia under the supervision of an                        anesthesiologist was determined to be medically                        necessary for this procedure based on review of the                        patient's medical history, medications, and prior                        anesthesia history.                       After obtaining informed consent, the colonoscope was                        passed under direct vision. Throughout the procedure,                        the patient's blood pressure, pulse, and oxygen                        saturations were monitored continuously. The                        Colonoscope was introduced through the anus and                        advanced to the the cecum, identified by the ileocecal                        valve. The colonoscopy was performed without                        difficulty. The patient tolerated the procedure well.                        The quality of the bowel preparation was excellent. Findings:      The perianal exam findings include non-thrombosed external hemorrhoids.      Scattered small-mouthed diverticula were found in the sigmoid colon and       descending colon.      The exam was otherwise without abnormality on direct and  retroflexion       views. Impression:           - Non-thrombosed external  hemorrhoids found on perianal                        exam.                       - Diverticulosis in the sigmoid colon and in the                        descending colon.                       - The examination was otherwise normal on direct and                        retroflexion views.                       - No specimens collected. Recommendation:       - Return to referring physician as previously scheduled. Procedure Code(s):    --- Professional ---                       [phone], Colonoscopy, flexible; diagnostic, including                        collection of specimen(s) by brushing or washing, when                        performed (separate procedure) CPT copyright 2016 American [employer]. All rights reserved. The codes documented in this report are preliminary and upon coder review may  be revised to meet current compliance requirements. Christene Lye, MD 12/08/2015 2:01:26 PM This report has been signed electronically. Number of Addenda: 0 Note Initiated On: 12/08/2015 1:04 PM Scope Withdrawal Time: 0 hours 4 minutes 36 seconds  Total Procedure Duration: 0 hours 22 minutes 27 seconds       [facility]

## 2015-12-09 ENCOUNTER — Encounter: Payer: Self-pay | Admitting: General Surgery

## 2015-12-09 NOTE — Addendum Note (Signed)
Addendum  created 12/09/15 1103 by [place] edited: Anesthesia Responsible Staff

## 2015-12-10 LAB — SURGICAL PATHOLOGY

## 2015-12-15 ENCOUNTER — Telehealth: Payer: Self-pay | Admitting: *Deleted

## 2015-12-15 NOTE — Telephone Encounter (Signed)
-----   Message from Christene Lye, MD sent at 12/10/2015  3:45 PM EDT ----- Rosann Auerbach, please let pt pt know the pathology was normal. Essentially normal upper and lower endoscopy. Likely will need hematology eval for her iron deficiency

## 2015-12-15 NOTE — Telephone Encounter (Signed)
Notified patient as instructed, patient pleased. Discussed follow-up appointments, patient agrees. She has a follow up with Dr Rosanna Randy and will discuss with him as well.

## 2015-12-16 ENCOUNTER — Encounter: Payer: Self-pay | Admitting: General Surgery

## 2015-12-23 ENCOUNTER — Telehealth: Payer: Self-pay | Admitting: Family Medicine

## 2015-12-23 DIAGNOSIS — D649 Anemia, unspecified: Secondary | ICD-10-CM

## 2015-12-23 NOTE — Telephone Encounter (Signed)
CBC and iron  Late next week.

## 2015-12-23 NOTE — Telephone Encounter (Signed)
Pt is asking if she will need to have labs done before her appointment on 01/05/2016?  CB#[phone]/MW

## 2015-12-23 NOTE — Telephone Encounter (Signed)
Dr. Darnell Level, does patient need to have another CBC drawn? Please advise. Thanks!

## 2015-12-24 NOTE — Telephone Encounter (Signed)
Pt advised she wont be able to do late next week i advised her if it is not possible than tomorrow or early next week will be fine-aa

## 2015-12-29 LAB — CBC WITH DIFFERENTIAL/PLATELET
Basophils Absolute: 0 10*3/uL (ref 0.0–0.2)
Basos: 0 %
EOS (ABSOLUTE): 0.2 10*3/uL (ref 0.0–0.4)
Eos: 2 %
Hematocrit: 40.5 % (ref 34.0–46.6)
Hemoglobin: 12.5 g/dL (ref 11.1–15.9)
Immature Grans (Abs): 0 10*3/uL (ref 0.0–0.1)
Immature Granulocytes: 0 %
Lymphocytes Absolute: 2.5 10*3/uL (ref 0.7–3.1)
Lymphs: 29 %
MCH: 25.9 pg — ABNORMAL LOW (ref 26.6–33.0)
MCHC: 30.9 g/dL — ABNORMAL LOW (ref 31.5–35.7)
MCV: 84 fL (ref 79–97)
Monocytes Absolute: 0.8 10*3/uL (ref 0.1–0.9)
Monocytes: 9 %
Neutrophils Absolute: 5.2 10*3/uL (ref 1.4–7.0)
Neutrophils: 60 %
Platelets: 420 10*3/uL — ABNORMAL HIGH (ref 150–379)
RBC: 4.83 x10E6/uL (ref 3.77–5.28)
RDW: 23.1 % — ABNORMAL HIGH (ref 12.3–15.4)
WBC: 8.7 10*3/uL (ref 3.4–10.8)

## 2015-12-29 LAB — IRON: Iron: 191 ug/dL — ABNORMAL HIGH (ref 27–139)

## 2016-01-05 ENCOUNTER — Ambulatory Visit (INDEPENDENT_AMBULATORY_CARE_PROVIDER_SITE_OTHER): Payer: Medicare Other | Admitting: Family Medicine

## 2016-01-05 VITALS — BP 140/60 | HR 68 | Temp 97.5°F | Resp 16 | Wt 199.0 lb

## 2016-01-05 DIAGNOSIS — K219 Gastro-esophageal reflux disease without esophagitis: Secondary | ICD-10-CM | POA: Diagnosis not present

## 2016-01-05 DIAGNOSIS — D649 Anemia, unspecified: Secondary | ICD-10-CM

## 2016-01-05 MED ORDER — OMEPRAZOLE 20 MG PO CPDR
20.0000 mg | DELAYED_RELEASE_CAPSULE | Freq: Every day | ORAL | Status: DC
Start: 2016-01-05 — End: 2016-04-07

## 2016-01-05 NOTE — Progress Notes (Signed)
Patient ID: Claire Kane, female   DOB: 17-May-1941, 75 y.o.   MRN: [id number]   DELAIN HAAGENSEN  MRN: [id number] DOB: Mar 06, 1941  Subjective:  HPI  1. Anemia, unspecified anemia type The patient is a 75 year old female who presents for follow up of her anemia.  She was last seen on 12/01/15 an told that we would see her today and dependent on her level today we may refer to hematology.  The patient states she has been seen by Dr. Jamal Collin on 12/08/15 and had Endoscopy and Colonoscopy, neither of which showed any reason for the anemia.  She did say that he found a hiatal hernia and diverticulosis.  The patient states that he told her the next step would be to see Hematology.  The patient had her CBC rechecked last Monday and states she received a call from our office instructing her that it was better and she  could decrease her Iron tablets to 1 tablet twice daily.  She has not actually changed the dose yet. The patient reports that she has been having a lot of acid reflux lately.  She is on Ranitidine BID and has been taking a lot of TUMS.  She is concerned about the number of TUMS she has been taking.    Patient Active Problem List   Diagnosis Date Noted  . Cataract 11/04/2015  . Clinical depression 11/04/2015  . Dyslipidemia 11/04/2015  . 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 Acid reflux 11/04/2015  . Adiposity 11/04/2015  . 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 Awareness of heartbeats 11/04/2015  . Depression 08/19/2015  . Fibrocystic breast disease 08/19/2015  . Carotid stenosis 08/19/2015  . Osteoarthritis 08/19/2015  . Obesity 08/19/2015  . Hyperlipidemia 08/19/2015  . 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 Heart murmur, systolic [id number]  . 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 Other specified symptoms and signs involving  the circulatory and respiratory systems 06/17/2011  . Acid indigestion 06/17/2011  . Gout 06/17/2011    Past Medical History  Diagnosis Date  . Heart murmur 2001  . Arthritis   . 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 complications   . High cholesterol   . Rosacea   . Cancer [facility]) 2002    DCIS right breast     Social History   Social History  . Marital Status: Married    Spouse Name: N/A  . Number of Children: N/A  . Years of Education: N/A   Occupational History  . Not on file.   Social History Main Topics  . Smoking status: Never Smoker   . Smokeless tobacco: Never Used  . Alcohol Use: No  . Drug Use: No  . Sexual Activity: No   Other Topics Concern  . Not on file   Social History Narrative    Outpatient Prescriptions Prior to Visit  Medication Sig Dispense Refill  . atorvastatin (LIPITOR) 10 MG tablet Take 10 mg by mouth daily.    . calcium citrate (CALCITRATE - DOSED IN MG ELEMENTAL CALCIUM) 950 MG tablet Take 200 mg of elemental calcium by mouth daily.    . clotrimazole-betamethasone (LOTRISONE) cream Apply 1 application topically 2 (two) times daily. 30 g 0  . cyclobenzaprine (FLEXERIL) 10 MG tablet Take 1 tablet (10 mg total) by mouth 3 (three) times daily as needed for muscle spasms. 90 tablet 0  .  ezetimibe (ZETIA) 10 MG tablet Take 10 mg by mouth daily.    . Ferrous Sulfate (IRON) 325 (65 Fe) MG TABS Take 325 mg by mouth 2 (two) times daily. 60 each 12  . glucosamine-chondroitin 500-400 MG tablet Take 1 tablet by mouth 3 (three) times daily.    Marland Kitchen ibuprofen (ADVIL,MOTRIN) 200 MG tablet Take by mouth.    . Multiple Vitamins-Minerals (MULTIVITAMIN WITH MINERALS) tablet Take 1 tablet by mouth daily.    . Omega-3 Fatty Acids (FISH OIL BURP-LESS) 1000 MG CAPS Take by mouth.    . ORACEA 40 MG capsule     . polyethylene glycol powder (GLYCOLAX/MIRALAX) powder 255 grams one bottle for colonoscopy prep 255 g 0  . psyllium (REGULOID) 0.52 G capsule Take  0.52 g by mouth daily.    . ranitidine (ZANTAC) 150 MG tablet Take 1 tablet (150 mg total) by mouth 2 (two) times daily. 60 tablet 12  . spironolactone (ALDACTONE) 50 MG tablet     . WELCHOL 625 MG tablet Take 1 tablet by mouth daily.    Marland Kitchen aspirin 81 MG tablet Take 81 mg by mouth daily. Reported on 01/05/2016     No facility-administered medications prior to visit.    No Known Allergies  Review of Systems  Constitutional: Negative for fever. Malaise/fatigue: slowly improving.  Eyes: Negative.   Respiratory: Negative for cough, shortness of breath and wheezing.   Cardiovascular: Positive for leg swelling (slight ankle swelling). Negative for chest pain, palpitations and orthopnea.  Gastrointestinal: Positive for heartburn and constipation. Negative for nausea, vomiting, abdominal pain, diarrhea and blood in stool.  Neurological: Negative for dizziness, weakness and headaches.  Endo/Heme/Allergies: Negative.   Psychiatric/Behavioral: Negative.    Objective:  BP 140/60 mmHg  Pulse 68  Temp(Src) 97.5 F (36.4 C) (Oral)  Resp 16  Wt 199 lb (90.266 kg)  Physical Exam  Constitutional: She is oriented to person, place, and time and well-developed, well-nourished, and in no distress.  HENT:  Head: Normocephalic and atraumatic.  Right Ear: External ear normal.  Left Ear: External ear normal.  Nose: Nose normal.  Eyes: Conjunctivae are normal. Pupils are equal, round, and reactive to light.  Neck: Normal range of motion.  Cardiovascular: Normal rate, regular rhythm and normal heart sounds.   Pulmonary/Chest: Effort normal and breath sounds normal.  Abdominal: Soft.  Neurological: She is alert and oriented to person, place, and time. Gait normal.  Skin: Skin is warm and dry.  Psychiatric: Mood, memory, affect and judgment normal.    Assessment and Plan :   1. Anemia, unspecified anemia type Will refer patient to hematology, repeat CBC and Iron only in 3 weeks, F?u in 3 months  -  Ambulatory referral to Hematology - omeprazole (PRILOSEC) 20 MG capsule; Take 1 capsule (20 mg total) by mouth daily.  Dispense: 30 capsule; Refill: 3  2. Gastroesophageal reflux disease without esophagitis  - omeprazole (PRILOSEC) 20 MG capsule; Take 1 capsule (20 mg total) by mouth daily.  Dispense: 30 capsule; Refill: 3 3.HLD I have done the exam and reviewed the above chart and it is accurate to the best of my knowledge.     Miguel Aschoff MD [facility] 01/05/2016 11:14 AM

## 2016-01-18 ENCOUNTER — Inpatient Hospital Stay: Payer: Medicare Other | Attending: Oncology | Admitting: Oncology

## 2016-01-18 ENCOUNTER — Inpatient Hospital Stay: Payer: Medicare Other

## 2016-01-18 VITALS — BP 144/81 | HR 82 | Temp 97.3°F | Resp 16 | Wt 194.9 lb

## 2016-01-18 DIAGNOSIS — E7800 Pure hypercholesterolemia, unspecified: Secondary | ICD-10-CM | POA: Diagnosis not present

## 2016-01-18 DIAGNOSIS — Z79899 Other long term (current) drug therapy: Secondary | ICD-10-CM | POA: Insufficient documentation

## 2016-01-18 DIAGNOSIS — M1990 Unspecified osteoarthritis, unspecified site: Secondary | ICD-10-CM | POA: Insufficient documentation

## 2016-01-18 DIAGNOSIS — Z923 Personal history of irradiation: Secondary | ICD-10-CM | POA: Insufficient documentation

## 2016-01-18 DIAGNOSIS — D509 Iron deficiency anemia, unspecified: Secondary | ICD-10-CM | POA: Diagnosis not present

## 2016-01-18 DIAGNOSIS — Z853 Personal history of malignant neoplasm of breast: Secondary | ICD-10-CM

## 2016-01-18 DIAGNOSIS — R7989 Other specified abnormal findings of blood chemistry: Secondary | ICD-10-CM

## 2016-01-18 LAB — IRON AND TIBC
IRON: 51 ug/dL (ref 28–170)
Saturation Ratios: 12 % (ref 10.4–31.8)
TIBC: 417 ug/dL (ref 250–450)
UIBC: 366 ug/dL

## 2016-01-18 LAB — CBC WITH DIFFERENTIAL/PLATELET
BASOS ABS: 0 10*3/uL (ref 0–0.1)
Basophils Relative: 1 %
Eosinophils Absolute: 0.1 10*3/uL (ref 0–0.7)
Eosinophils Relative: 1 %
HEMATOCRIT: 40 % (ref 35.0–47.0)
HEMOGLOBIN: 13.5 g/dL (ref 12.0–16.0)
LYMPHS PCT: 30 %
Lymphs Abs: 3 10*3/uL (ref 1.0–3.6)
MCH: 27.6 pg (ref 26.0–34.0)
MCHC: 33.7 g/dL (ref 32.0–36.0)
MCV: 82 fL (ref 80.0–100.0)
MONO ABS: 0.8 10*3/uL (ref 0.2–0.9)
Monocytes Relative: 8 %
NEUTROS ABS: 5.9 10*3/uL (ref 1.4–6.5)
Neutrophils Relative %: 60 %
PLATELETS: 355 10*3/uL (ref 150–440)
RBC: 4.87 MIL/uL (ref 3.80–5.20)
RDW: 22.5 % — AB (ref 11.5–14.5)
WBC: 9.9 10*3/uL (ref 3.6–11.0)

## 2016-01-18 LAB — FOLATE: FOLATE: 38 ng/mL (ref >5.9)

## 2016-01-18 LAB — FERRITIN: FERRITIN: 14 ng/mL (ref 11–307)

## 2016-01-18 NOTE — Progress Notes (Signed)
Patient's hemoglobin was low in 10/2015 so she started oral iron which did improve the hemoglobin.  She had EGD and colonoscopy that was resulted as normal with no noted bleeding.

## 2016-01-19 LAB — VITAMIN B12: Vitamin B-12: 287 pg/mL (ref 180–914)

## 2016-01-24 NOTE — Progress Notes (Signed)
[place]  [phone]) [phone] [phone]  ID: [name] OB: [birth year]  MR#: [id number]  [id number]  Patient Care Team: Jerrol Banana., MD as PCP - General (Family Medicine) Christene Lye, MD (General Surgery) Jerrol Banana., MD (Family Medicine)  CHIEF COMPLAINT:  Chief Complaint  Patient presents with  . New Evaluation    INTERVAL HISTORY: Patient is a 75 year old female as noted to have significant iron deficiency anemia in February 2017. She also had thrombocytosis. Colonoscopy and EGD at that time was reported as normal with no obvious evidence of an etiology. Patient was initiated on oral iron and has been referred to clinic for further evaluation. Currently, she feels well and is asymptomatic. She has no neurologic complaints. She denies any recent fevers or illnesses. She has a good appetite and denies weight loss. She denies any chest pain or shortness of breath. She denies any nausea, vomiting, constipation, or diarrhea. She has no melena or hematochezia. She has no urinary complaints. Patient feels at her baseline and offers no specific complaints today.  REVIEW OF SYSTEMS:   Review of Systems  Constitutional: Negative.  Negative for fever, weight loss and malaise/fatigue.  Respiratory: Negative.  Negative for hemoptysis and shortness of breath.   Cardiovascular: Negative.  Negative for chest pain.  Gastrointestinal: Negative.  Negative for blood in stool and melena.  Genitourinary: Negative.  Negative for hematuria.  Musculoskeletal: Negative.   Neurological: Negative.  Negative for weakness.  Psychiatric/Behavioral: Negative.     As per HPI. Otherwise, a complete review of systems is negatve.  PAST MEDICAL HISTORY: Past Medical History  Diagnosis Date  . Heart murmur 2001  . Arthritis   . 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 complications   . High cholesterol   . Rosacea   . Cancer [facility])  2002    DCIS right breast     PAST SURGICAL HISTORY: Past Surgical History  Procedure Laterality Date  . Stab pheblectomy   2010  . Vein closure procedure Bilateral 2008  . Abdominal hysterectomy    . Cholecystectomy    . Breast surgery Right 2002    lumpectomy  . Eye surgery Right 2014  . Colonoscopy  2013  . Breast excisional biopsy Right     positive 2002  . Breast cyst aspiration Right     2003  . Post radiation therapy Right     right breast cancer 2002  . Colonoscopy with propofol N/A 12/08/2015    Procedure: COLONOSCOPY WITH PROPOFOL;  Surgeon: Christene Lye, MD;  Location: [facility] ENDOSCOPY;  Service: Endoscopy;  Laterality: N/A;  . Esophagogastroduodenoscopy (egd) with propofol N/A 12/08/2015    Procedure: ESOPHAGOGASTRODUODENOSCOPY (EGD) WITH PROPOFOL;  Surgeon: Christene Lye, MD;  Location: [facility] ENDOSCOPY;  Service: Endoscopy;  Laterality: N/A;    FAMILY HISTORY Family History  Problem Relation Age of Onset  . Breast cancer Cousin 74    breast/maternal  . Cancer Cousin 63    breast/maternal  . Breast cancer Maternal Aunt 70  . Heart attack Mother   . Heart attack Father   . Heart attack Sister   . Stroke Sister   . Heart attack Brother   . Other Brother     cerebral hemorrhage, sepsis  . Breast cancer Other 48    maternal neice       ADVANCED DIRECTIVES:    HEALTH MAINTENANCE: Social History  Substance Use Topics  . Smoking status: Never Smoker   .  Smokeless tobacco: Never Used  . Alcohol Use: No     Colonoscopy:  PAP:  Bone density:  Lipid panel:  No Known Allergies  Current Outpatient Prescriptions  Medication Sig Dispense Refill  . atorvastatin (LIPITOR) 10 MG tablet Take 10 mg by mouth daily.    . calcium citrate (CALCITRATE - DOSED IN MG ELEMENTAL CALCIUM) 950 MG tablet Take 200 mg of elemental calcium by mouth daily.    . clotrimazole-betamethasone (LOTRISONE) cream Apply 1 application topically 2 (two) times daily. 30 g  0  . ezetimibe (ZETIA) 10 MG tablet Take 10 mg by mouth daily.    . Ferrous Sulfate (IRON) 325 (65 Fe) MG TABS Take 325 mg by mouth 2 (two) times daily. 60 each 12  . glucosamine-chondroitin 500-400 MG tablet Take 1 tablet by mouth 3 (three) times daily.    . Multiple Vitamins-Minerals (MULTIVITAMIN WITH MINERALS) tablet Take 1 tablet by mouth daily.    . Omega-3 Fatty Acids (FISH OIL BURP-LESS) 1000 MG CAPS Take by mouth.    Marland Kitchen omeprazole (PRILOSEC) 20 MG capsule Take 1 capsule (20 mg total) by mouth daily. 30 capsule 3  . ORACEA 40 MG capsule     . polyethylene glycol powder (GLYCOLAX/MIRALAX) powder 255 grams one bottle for colonoscopy prep 255 g 0  . psyllium (REGULOID) 0.52 G capsule Take 0.52 g by mouth daily.    . ranitidine (ZANTAC) 150 MG tablet Take 1 tablet (150 mg total) by mouth 2 (two) times daily. 60 tablet 12  . spironolactone (ALDACTONE) 50 MG tablet     . WELCHOL 625 MG tablet Take 1 tablet by mouth daily.     No current facility-administered medications for this visit.    OBJECTIVE: Filed Vitals:   01/18/16 1426  BP: 144/81  Pulse: 82  Temp: 97.3 F (36.3 C)  Resp: 16     Body mass index is 32.43 kg/(m^2).    ECOG FS:0 - Asymptomatic  General: Well-developed, well-nourished, no acute distress. Eyes: Pink conjunctiva, anicteric sclera. HEENT: Normocephalic, moist mucous membranes, clear oropharnyx. Lungs: Clear to auscultation bilaterally. Heart: Regular rate and rhythm. No rubs, murmurs, or gallops. Abdomen: Soft, nontender, nondistended. No organomegaly noted, normoactive bowel sounds. Musculoskeletal: No edema, cyanosis, or clubbing. Neuro: Alert, answering all questions appropriately. Cranial nerves grossly intact. Skin: No rashes or petechiae noted. Psych: Normal affect. Lymphatics: No cervical, calvicular, axillary or inguinal LAD.   LAB RESULTS:  Lab Results  Component Value Date   NA 140 11/04/2015   K 4.5 11/04/2015   CL 101 11/04/2015   CO2 23  11/04/2015   GLUCOSE 99 11/04/2015   BUN 15 11/04/2015   CREATININE 0.84 11/04/2015   CALCIUM 9.1 11/04/2015   PROT 7.1 11/10/2015   ALBUMIN 4.5 11/04/2015   AST 18 11/04/2015   ALT 17 11/04/2015   ALKPHOS 65 11/04/2015   BILITOT 0.6 11/04/2015   GFRNONAA 69 11/04/2015   GFRAA 79 11/04/2015    Lab Results  Component Value Date   WBC 9.9 01/18/2016   NEUTROABS 5.9 01/18/2016   HGB 13.5 01/18/2016   HCT 40.0 01/18/2016   MCV 82.0 01/18/2016   PLT 355 01/18/2016   Lab Results  Component Value Date   IRON 51 01/18/2016   TIBC 417 01/18/2016   IRONPCTSAT 12 01/18/2016    Lab Results  Component Value Date   FERRITIN 14 01/18/2016     STUDIES: No results found.  ASSESSMENT: Iron deficiency anemia, thrombocytosis. Resolved.  PLAN:  1. Iron deficiency anemia: Patient's hemoglobin and iron stores are now within normal limits. The remainder of her anemia workup including B12, folate, SPEP, and hemolysis labs are all either negative or within normal limits. EGD and colonoscopy in March 2017 are reported as negative. No intervention is needed at this time. Patient has been instructed to continue her oral iron supplementation. Return to clinic in 6 months with repeat laboratory work and further evaluation. If her hemoglobin and iron stores remain within normal limits at that time, she likely can be discharged from clinic. 2. Thrombocytosis: Likely secondary to iron deficiency anemia. Resolved. Follow-up as above.  Patient expressed understanding and was in agreement with this plan. She also understands that She can call clinic at any time with any questions, concerns, or complaints.    Lloyd Huger, MD   01/24/2016 7:26 AM

## 2016-02-05 LAB — CBC WITH DIFFERENTIAL/PLATELET
BASOS: 0 %
Basophils Absolute: 0 10*3/uL (ref 0.0–0.2)
EOS (ABSOLUTE): 0.2 10*3/uL (ref 0.0–0.4)
EOS: 2 %
HEMATOCRIT: 41.1 % (ref 34.0–46.6)
HEMOGLOBIN: 13.3 g/dL (ref 11.1–15.9)
IMMATURE GRANS (ABS): 0 10*3/uL (ref 0.0–0.1)
Immature Granulocytes: 0 %
LYMPHS ABS: 3.2 10*3/uL — AB (ref 0.7–3.1)
LYMPHS: 32 %
MCH: 27.5 pg (ref 26.6–33.0)
MCHC: 32.4 g/dL (ref 31.5–35.7)
MCV: 85 fL (ref 79–97)
MONOCYTES: 9 %
Monocytes Absolute: 0.9 10*3/uL (ref 0.1–0.9)
NEUTROS ABS: 5.7 10*3/uL (ref 1.4–7.0)
Neutrophils: 57 %
Platelets: 363 10*3/uL (ref 150–379)
RBC: 4.83 x10E6/uL (ref 3.77–5.28)
RDW: 18.1 % — ABNORMAL HIGH (ref 12.3–15.4)
WBC: 10 10*3/uL (ref 3.4–10.8)

## 2016-02-05 LAB — IRON: IRON: 166 ug/dL — AB (ref 27–139)

## 2016-02-22 ENCOUNTER — Ambulatory Visit (INDEPENDENT_AMBULATORY_CARE_PROVIDER_SITE_OTHER): Payer: Medicare Other | Admitting: Family Medicine

## 2016-02-22 VITALS — BP 110/62 | HR 74 | Temp 98.3°F | Resp 16 | Wt 198.0 lb

## 2016-02-22 DIAGNOSIS — K219 Gastro-esophageal reflux disease without esophagitis: Secondary | ICD-10-CM | POA: Diagnosis not present

## 2016-02-22 DIAGNOSIS — D649 Anemia, unspecified: Secondary | ICD-10-CM | POA: Diagnosis not present

## 2016-02-22 NOTE — Progress Notes (Signed)
Patient ID: Claire Kane, female   DOB: Mar 01, 1941, 75 y.o.   MRN: [id number]    Subjective:  HPI Anemia- Pt is her for a 3 month follow up on anemia. She was referred to Hematology. Recheck CBC in 3 weeks. Pt reports that the hematologist said that he is did not see any reason for the anemia. Pt is feeling a lot better. She wants to know if she needs to keep taking the iron. Her last CBC was done on 02/04/16 and hemoglobin was within normal ranges. Her iron was 166.    GERD- Pt reports that she has been taking Omeprazole and the heartburn seems to be better but she is still burping after anything that ingested. She reports that Dr. Jamal Collin told her she had hital Hernia and she does not know if that could be causing the burping.   Prior to Admission medications   Medication Sig Start Date End Date Taking? Authorizing Provider  atorvastatin (LIPITOR) 10 MG tablet Take 10 mg by mouth daily.   Yes Historical Provider, MD  calcium citrate (CALCITRATE - DOSED IN MG ELEMENTAL CALCIUM) 950 MG tablet Take 200 mg of elemental calcium by mouth daily.   Yes Historical Provider, MD  ezetimibe (ZETIA) 10 MG tablet Take 10 mg by mouth daily.   Yes Historical Provider, MD  Ferrous Sulfate (IRON) 325 (65 Fe) MG TABS Take 325 mg by mouth 2 (two) times daily. 11/10/15  Yes Richard Maceo Pro., MD  glucosamine-chondroitin 500-400 MG tablet Take 1 tablet by mouth 3 (three) times daily.   Yes Historical Provider, MD  Multiple Vitamins-Minerals (MULTIVITAMIN WITH MINERALS) tablet Take 1 tablet by mouth daily.   Yes Historical Provider, MD  Omega-3 Fatty Acids (FISH OIL BURP-LESS) 1000 MG CAPS Take by mouth.   Yes Historical Provider, MD  omeprazole (PRILOSEC) 20 MG capsule Take 1 capsule (20 mg total) by mouth daily. 01/05/16  Yes Richard Maceo Pro., MD  ORACEA 40 MG capsule PRN 03/02/15  Yes Historical Provider, MD  psyllium (REGULOID) 0.52 G capsule Take 0.52 g by mouth daily.   Yes Historical Provider, MD    ranitidine (ZANTAC) 150 MG tablet Take 1 tablet (150 mg total) by mouth 2 (two) times daily. 05/28/15  Yes Richard Maceo Pro., MD  spironolactone (ALDACTONE) 50 MG tablet  12/29/14  Yes Historical Provider, MD  WELCHOL 625 MG tablet Take 1 tablet by mouth daily. 05/21/13  Yes Historical Provider, MD  clotrimazole-betamethasone (LOTRISONE) cream Apply 1 application topically 2 (two) times daily. 11/04/15   Richard Maceo Pro., MD    Patient Active Problem List   Diagnosis Date Noted  . Breathlessness on exertion 11/13/2015  . Carotid artery narrowing 11/13/2015  . Cataract 11/04/2015  . Clinical depression 11/04/2015  . Dyslipidemia 11/04/2015  . 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 Acid reflux 11/04/2015  . Adiposity 11/04/2015  . 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 Awareness of heartbeats 11/04/2015  . Depression 08/19/2015  . Fibrocystic breast disease 08/19/2015  . Carotid stenosis 08/19/2015  . Osteoarthritis 08/19/2015  . Obesity 08/19/2015  . Hyperlipidemia 08/19/2015  . 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 Heart murmur, systolic 123456  . 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 Other specified symptoms and signs involving the circulatory and respiratory systems 06/17/2011  . Acid indigestion 06/17/2011  . Gout 06/17/2011  Past Medical History  Diagnosis Date  . Heart murmur 2001  . Arthritis   . 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 complications   . High cholesterol   . Rosacea   . Cancer [facility]) 2002    DCIS right breast     Social History   Social History  . Marital Status: Married    Spouse Name: N/A  . Number of Children: N/A  . Years of Education: N/A   Occupational History  . Not on file.   Social History Main Topics  . Smoking status: Never Smoker   . Smokeless  tobacco: Never Used  . Alcohol Use: No  . Drug Use: No  . Sexual Activity: No   Other Topics Concern  . Not on file   Social History Narrative    No Known Allergies  Review of Systems  Constitutional: Negative.   HENT: Negative.   Eyes: Negative.   Respiratory: Negative.   Cardiovascular: Negative.   Gastrointestinal:       Burping.  Genitourinary: Negative.   Musculoskeletal: Negative.   Skin: Negative.   Endo/Heme/Allergies: Negative.   Psychiatric/Behavioral: Negative.     Immunization History  Administered Date(s) Administered  . Influenza, High Dose Seasonal PF 05/28/2015  . Pneumococcal Conjugate-13 04/22/2014  . Pneumococcal Polysaccharide-23 03/15/2011  . Td 09/06/2007  . Tdap 04/22/2014  . Zoster 06/27/2011   Objective:  BP 110/62 mmHg  Pulse 74  Temp(Src) 98.3 F (36.8 C) (Oral)  Resp 16  Wt 198 lb (89.812 kg)  Physical Exam  Constitutional: She is oriented to person, place, and time and well-developed, well-nourished, and in no distress.  HENT:  Head: Normocephalic and atraumatic.  Right Ear: External ear normal.  Left Ear: External ear normal.  Nose: Nose normal.  Eyes: Conjunctivae and EOM are normal. Pupils are equal, round, and reactive to light.  Neck: Normal range of motion. Neck supple.  Cardiovascular: Normal rate, regular rhythm, normal heart sounds and intact distal pulses.   Pulmonary/Chest: Effort normal and breath sounds normal.  Abdominal: Soft. Bowel sounds are normal.  Musculoskeletal: Normal range of motion.  Neurological: She is alert and oriented to person, place, and time. She has normal reflexes. Gait normal. GCS score is 15.  Skin: Skin is warm and dry.  Psychiatric: Mood, memory, affect and judgment normal.    Lab Results  Component Value Date   WBC 10.0 02/04/2016   HGB 13.5 01/18/2016   HCT 41.1 02/04/2016   PLT 363 02/04/2016   GLUCOSE 99 11/04/2015   CHOL 179 04/14/2014   TRIG 79 04/14/2014   HDL 63  04/14/2014   LDLCALC 100 04/14/2014   TSH 2.050 11/04/2015    CMP     Component Value Date/Time   NA 140 11/04/2015 1204   K 4.5 11/04/2015 1204   CL 101 11/04/2015 1204   CO2 23 11/04/2015 1204   GLUCOSE 99 11/04/2015 1204   BUN 15 11/04/2015 1204   CREATININE 0.84 11/04/2015 1204   CALCIUM 9.1 11/04/2015 1204   PROT 7.1 11/10/2015 1543   ALBUMIN 4.5 11/04/2015 1204   AST 18 11/04/2015 1204   ALT 17 11/04/2015 1204   ALKPHOS 65 11/04/2015 1204   BILITOT 0.6 11/04/2015 1204   GFRNONAA 69 11/04/2015 1204   GFRAA 79 11/04/2015 1204    Assessment and Plan :  1. Anemia, unspecified anemia type Stop iron. Will recheck CBC and iron after she has stopped it in about a month.   2. Gastroesophageal  reflux disease without esophagitis Possibly coming from Iron vs. Hernia. Pt to stop iron, if does not improve will send back to Dr. Jamal Collin.   3.HLD  Patient was seen and examined by Dr. Miguel Aschoff, and noted scribed by Webb Laws, [place] MD [facility] 02/22/2016 11:54 AM

## 2016-03-09 ENCOUNTER — Encounter: Payer: Medicare Other | Admitting: Family Medicine

## 2016-03-12 LAB — CBC WITH DIFFERENTIAL/PLATELET
BASOS ABS: 0 10*3/uL (ref 0.0–0.2)
Basos: 0 %
EOS (ABSOLUTE): 0.2 10*3/uL (ref 0.0–0.4)
Eos: 2 %
Hematocrit: 39.9 % (ref 34.0–46.6)
Hemoglobin: 13.4 g/dL (ref 11.1–15.9)
Immature Grans (Abs): 0 10*3/uL (ref 0.0–0.1)
Immature Granulocytes: 0 %
LYMPHS ABS: 2.7 10*3/uL (ref 0.7–3.1)
LYMPHS: 32 %
MCH: 28.5 pg (ref 26.6–33.0)
MCHC: 33.6 g/dL (ref 31.5–35.7)
MCV: 85 fL (ref 79–97)
MONOS ABS: 0.8 10*3/uL (ref 0.1–0.9)
Monocytes: 10 %
NEUTROS ABS: 4.6 10*3/uL (ref 1.4–7.0)
Neutrophils: 56 %
PLATELETS: 356 10*3/uL (ref 150–379)
RBC: 4.71 x10E6/uL (ref 3.77–5.28)
RDW: 13.6 % (ref 12.3–15.4)
WBC: 8.3 10*3/uL (ref 3.4–10.8)

## 2016-03-12 LAB — IRON: IRON: 167 ug/dL — AB (ref 27–139)

## 2016-03-16 ENCOUNTER — Encounter: Payer: Self-pay | Admitting: Family Medicine

## 2016-03-16 ENCOUNTER — Ambulatory Visit (INDEPENDENT_AMBULATORY_CARE_PROVIDER_SITE_OTHER): Payer: Medicare Other | Admitting: Family Medicine

## 2016-03-16 VITALS — BP 108/62 | HR 72 | Temp 98.5°F | Resp 14 | Ht 63.0 in | Wt 198.0 lb

## 2016-03-16 DIAGNOSIS — K449 Diaphragmatic hernia without obstruction or gangrene: Secondary | ICD-10-CM | POA: Diagnosis not present

## 2016-03-16 DIAGNOSIS — R142 Eructation: Secondary | ICD-10-CM | POA: Diagnosis not present

## 2016-03-16 DIAGNOSIS — Z0000 Encounter for general adult medical examination without abnormal findings: Secondary | ICD-10-CM | POA: Diagnosis not present

## 2016-03-16 MED ORDER — SUCRALFATE 1 G PO TABS: 1.0000 g | ORAL_TABLET | Freq: Three times a day (TID) | ORAL | Status: DC

## 2016-03-16 NOTE — Progress Notes (Signed)
Patient: Claire Kane, Female    DOB: Oct 09, 1940, 75 y.o.   MRN: [id number] Visit Date: 03/16/2016  Today's Provider: Wilhemena Durie, MD   Chief Complaint  Patient presents with  . Medicare Wellness   Subjective:   Claire Kane is a 75 y.o. female who presents today for her Subsequent Annual Wellness Visit. She feels fairly well. She reports exercising . She reports she is sleeping well. Patient has had no falls. Discussed fall risk and regular exercise. Pt  is having a lot of belching after any eating in the past few months. No postprandial pain or any abdominal pain at all. Immunization History  Administered Date(s) Administered  . Influenza, High Dose Seasonal PF 05/28/2015  . Pneumococcal Conjugate-13 04/22/2014  . Pneumococcal Polysaccharide-23 03/15/2011  . Td 09/06/2007  . Tdap 04/22/2014  . Zoster 06/27/2011    Last Colonoscopy 12/08/15 diverticulosis   Endoscopy 12/08/15 hiatal hernia  Pap smear 02/11/09-per patient sees Dr. Kenton Kingfisher 3 times a week and she had pap  smear recently  Mammogram 07/27/15-Dr Jamal Collin did breast exam in November also  BMD 04/05/12 normal  Review of Systems  Constitutional: Negative.   HENT: Negative.   Eyes: Negative.   Respiratory: Negative.   Cardiovascular: Positive for leg swelling.  Gastrointestinal: Positive for constipation and abdominal distention.  Endocrine: Negative.   Genitourinary: Positive for urgency.  Musculoskeletal: Positive for back pain and arthralgias.  Skin: Negative.   Allergic/Immunologic: Negative.   Neurological: Negative.   Hematological: Negative.   Psychiatric/Behavioral: Negative.     Patient Active Problem List   Diagnosis Date Noted  . Breathlessness on exertion 11/13/2015  . Carotid artery narrowing 11/13/2015  . Cataract 11/04/2015  . Clinical depression 11/04/2015  . Dyslipidemia 11/04/2015  . 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 Acid reflux 11/04/2015  . Adiposity 11/04/2015  . Arthritis of knee,  degenerative 11/04/2015  . 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 Awareness of heartbeats 11/04/2015  . Depression 08/19/2015  . Fibrocystic breast disease 08/19/2015  . Carotid stenosis 08/19/2015  . Osteoarthritis 08/19/2015  . Obesity 08/19/2015  . Hyperlipidemia 08/19/2015  . 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 Heart murmur, systolic 123456  . 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 Other specified symptoms and signs involving the circulatory and respiratory systems 06/17/2011  . Acid indigestion 06/17/2011  . Gout 06/17/2011    Social History   Social History  . Marital Status: Married    Spouse Name: N/A  . Number of Children: N/A  . Years of Education: N/A   Occupational History  . Not on file.   Social History Main Topics  . Smoking status: Never Smoker   . Smokeless tobacco: Never Used  . Alcohol Use: Yes     Comment: monthly or less 1 to 2 drinks at that time  . Drug Use: No  . Sexual Activity: No   Other Topics Concern  . Not on file   Social History Narrative    Past Surgical History  Procedure Laterality Date  . Stab pheblectomy   2010  . Vein closure procedure Bilateral 2008  . Abdominal hysterectomy    . Cholecystectomy    . Breast surgery Right 2002    lumpectomy  . Eye surgery Right 2014  . Colonoscopy  2013  . Breast excisional biopsy Right     positive 2002  . Breast cyst aspiration Right  2003  . Post radiation therapy Right     right breast cancer 2002  . Colonoscopy with propofol N/A 12/08/2015    Procedure: COLONOSCOPY WITH PROPOFOL;  Surgeon: Christene Lye, MD;  Location: [facility] ENDOSCOPY;  Service: Endoscopy;  Laterality: N/A;  . Esophagogastroduodenoscopy (egd) with propofol N/A 12/08/2015    Procedure: ESOPHAGOGASTRODUODENOSCOPY (EGD) WITH PROPOFOL;  Surgeon: Christene Lye, MD;  Location:  [facility] ENDOSCOPY;  Service: Endoscopy;  Laterality: N/A;    Her family history includes Breast cancer (age of onset: 64) in her other; Breast cancer (age of onset: 9) in her cousin; Breast cancer (age of onset: 74) in her maternal aunt; Cancer (age of onset: 75) in her cousin; Heart attack in her brother, father, mother, and sister; Other in her brother; Stroke in her sister.    Outpatient Prescriptions Prior to Visit  Medication Sig Dispense Refill  . atorvastatin (LIPITOR) 10 MG tablet Take 10 mg by mouth daily.    . calcium citrate (CALCITRATE - DOSED IN MG ELEMENTAL CALCIUM) 950 MG tablet Take 200 mg of elemental calcium by mouth daily.    . clotrimazole-betamethasone (LOTRISONE) cream Apply 1 application topically 2 (two) times daily. 30 g 0  . ezetimibe (ZETIA) 10 MG tablet Take 10 mg by mouth daily.    Marland Kitchen glucosamine-chondroitin 500-400 MG tablet Take 1 tablet by mouth 3 (three) times daily.    . Multiple Vitamins-Minerals (MULTIVITAMIN WITH MINERALS) tablet Take 1 tablet by mouth daily.    . Omega-3 Fatty Acids (FISH OIL BURP-LESS) 1000 MG CAPS Take by mouth.    Marland Kitchen omeprazole (PRILOSEC) 20 MG capsule Take 1 capsule (20 mg total) by mouth daily. 30 capsule 3  . ORACEA 40 MG capsule PRN    . psyllium (REGULOID) 0.52 G capsule Take 0.52 g by mouth daily.    . ranitidine (ZANTAC) 150 MG tablet Take 1 tablet (150 mg total) by mouth 2 (two) times daily. 60 tablet 12  . spironolactone (ALDACTONE) 50 MG tablet     . WELCHOL 625 MG tablet Take 1 tablet by mouth daily.    . Ferrous Sulfate (IRON) 325 (65 Fe) MG TABS Take 325 mg by mouth 2 (two) times daily. 60 each 12   No facility-administered medications prior to visit.    No Known Allergies  Patient Care Team: Jerrol Banana., MD as PCP - General (Family Medicine) Seeplaputhur Robinette Haines, MD (General Surgery) Jerrol Banana., MD (Family Medicine)  Objective:   Vitals:  Filed Vitals:   03/16/16 1017  BP: 108/62  Pulse: 72   Temp: 98.5 F (36.9 C)  Resp: 14  Height: 5\' 3"  (1.6 m)  Weight: 198 lb (89.812 kg)    Physical Exam  Constitutional: She is oriented to person, place, and time. She appears well-developed and well-nourished.  HENT:  Head: Normocephalic and atraumatic.  Right Ear: External ear normal.  Left Ear: External ear normal.  Eyes: Conjunctivae are normal. Pupils are equal, round, and reactive to light.  Neck: Normal range of motion. Neck supple.  Cardiovascular: Normal rate, regular rhythm, normal heart sounds and intact distal pulses.   No murmur heard. Pulmonary/Chest: Effort normal and breath sounds normal.  Abdominal: She exhibits no distension. There is no tenderness.  Musculoskeletal: She exhibits no tenderness.  Neurological: She is alert and oriented to person, place, and time.  Skin: No erythema.  Psychiatric: She has a normal mood and affect. Her behavior is normal. Judgment and thought content normal.  Activities of Daily Living In your present state of health, do you have any difficulty performing the following activities: 03/16/2016 05/28/2015  Hearing? N N  Vision? N N  Difficulty concentrating or making decisions? Tempie Donning  Walking or climbing stairs? Y Y  Dressing or bathing? N N  Doing errands, shopping? N N    Fall Risk Assessment Fall Risk  03/16/2016 05/28/2015  Falls in the past year? No Yes  Number falls in past yr: - 1  Injury with Fall? - No     Depression Screen PHQ 2/9 Scores 03/16/2016 05/28/2015  PHQ - 2 Score 0 0    Cognitive Testing - 6-CIT    Year: 0 4 points  Month: 0 3 points  Memorize "Pia Mau, [address] [place]"  Time (within 1 hour:) 0 3 points  Count backwards from 20: 0 2 4 points  Name months of year: 0 2 4 points  Repeat Address: 0 2 4 6 8 10  points   Total Score: 3/28  Interpretation : Normal (0-7) Abnormal (8-28)    Assessment & Plan:     Annual Wellness Visit  Reviewed patient's Family Medical History Reviewed and  updated list of patient's medical providers Assessment of cognitive impairment was done Assessed patient's functional ability Established a written schedule for health screening [place] Completed and Reviewed 1. Medicare annual wellness visit, subsequent Pap smear is done with Dr. Kenton Kingfisher and mammogram and breast exam done with Dr. Jamal Collin.  2. Hiatal hernia Per endoscopy.  3. Belching Start Carafate and re check in 1 month.EGD revealed hiatal hernia only.  Patient was seen and examined by Dr. Eulas Post and note was scribed by Theressa Millard, RMA.     Discussed health benefits of physical activity, and encouraged her to engage in regular exercise appropriate for her age and condition.    Miguel Aschoff MD [facility] 03/16/2016 10:19 AM  ------------------------------------------------------------------------------------------------------------

## 2016-04-07 ENCOUNTER — Other Ambulatory Visit: Payer: Self-pay | Admitting: Family Medicine

## 2016-04-07 DIAGNOSIS — K30 Functional dyspepsia: Secondary | ICD-10-CM

## 2016-04-28 ENCOUNTER — Ambulatory Visit (INDEPENDENT_AMBULATORY_CARE_PROVIDER_SITE_OTHER): Payer: Medicare Other | Admitting: Family Medicine

## 2016-04-28 VITALS — BP 140/70 | HR 68 | Temp 97.6°F | Resp 16 | Wt 200.0 lb

## 2016-04-28 DIAGNOSIS — D649 Anemia, unspecified: Secondary | ICD-10-CM | POA: Diagnosis not present

## 2016-04-28 DIAGNOSIS — D509 Iron deficiency anemia, unspecified: Secondary | ICD-10-CM | POA: Insufficient documentation

## 2016-04-28 DIAGNOSIS — R142 Eructation: Secondary | ICD-10-CM | POA: Diagnosis not present

## 2016-04-28 NOTE — Progress Notes (Signed)
Claire Kane  MRN: [id number] DOB: 1941-02-21  Subjective:  HPI   The patient is a 75 year old female who presents for up after starting on Carafate for her belching.  She was seen 1 month ago and states that since starting the medicine she is improving but still has the problem.  Her bleching is worst after she eats, but also when drinking water.  She has raised the head of her bed and no longer has any problems at night.  The patient also wanted to know if it was time to check her CBC again.  She had stopped the Iron on February 22, 2016 and then had it checked on 03/11/16.    Patient Active Problem List   Diagnosis Date Noted  . Breathlessness on exertion 11/13/2015  . Carotid artery narrowing 11/13/2015  . Cataract 11/04/2015  . Clinical depression 11/04/2015  . Dyslipidemia 11/04/2015  . 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 Acid reflux 11/04/2015  . Adiposity 11/04/2015  . 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 Awareness of heartbeats 11/04/2015  . Depression 08/19/2015  . Fibrocystic breast disease 08/19/2015  . Carotid stenosis 08/19/2015  . Osteoarthritis 08/19/2015  . Obesity 08/19/2015  . Hyperlipidemia 08/19/2015  . 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 Heart murmur, systolic 123456  . 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 Other specified symptoms and signs involving the circulatory and respiratory systems 06/17/2011  . Acid indigestion 06/17/2011  . Gout 06/17/2011    Past Medical History:  Diagnosis Date  . Arthritis   . Cancer [facility]) 2002   DCIS right breast   . Heart murmur 2001  . High cholesterol   . Rosacea   . 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 complications     Social History   Social History  . Marital status: Married    Spouse name: N/A  . Number of children: N/A  .  Years of education: N/A   Occupational History  . Not on file.   Social History Main Topics  . Smoking status: Never Smoker  . Smokeless tobacco: Never Used  . Alcohol use Yes     Comment: monthly or less 1 to 2 drinks at that time  . Drug use: No  . Sexual activity: No   Other Topics Concern  . Not on file   Social History Narrative  . No narrative on file    Outpatient Medications Prior to Visit  Medication Sig Dispense Refill  . atorvastatin (LIPITOR) 10 MG tablet Take 10 mg by mouth daily.    . calcium citrate (CALCITRATE - DOSED IN MG ELEMENTAL CALCIUM) 950 MG tablet Take 200 mg of elemental calcium by mouth daily.    . clotrimazole-betamethasone (LOTRISONE) cream Apply 1 application topically 2 (two) times daily. 30 g 0  . ezetimibe (ZETIA) 10 MG tablet Take 10 mg by mouth daily.    Marland Kitchen glucosamine-chondroitin 500-400 MG tablet Take 1 tablet by mouth 3 (three) times daily.    . Multiple Vitamins-Minerals (MULTIVITAMIN WITH MINERALS) tablet Take 1 tablet by mouth daily.    . Omega-3 Fatty Acids (FISH OIL BURP-LESS) 1000 MG CAPS Take by mouth.    Marland Kitchen omeprazole (PRILOSEC) 20 MG capsule take 1 capsule by mouth once daily 30 capsule 11  . ORACEA 40 MG capsule PRN    . psyllium (REGULOID) 0.52 G capsule Take  0.52 g by mouth daily.    . ranitidine (ZANTAC) 150 MG tablet Take 1 tablet (150 mg total) by mouth 2 (two) times daily. 60 tablet 12  . spironolactone (ALDACTONE) 50 MG tablet     . sucralfate (CARAFATE) 1 g tablet Take 1 tablet (1 g total) by mouth 4 (four) times daily -  with meals and at bedtime. 120 tablet 3  . WELCHOL 625 MG tablet Take 1 tablet by mouth daily.     No facility-administered medications prior to visit.     No Known Allergies  Review of Systems  Constitutional: Negative for fever and malaise/fatigue.  Eyes: Negative.   Respiratory: Negative for cough, shortness of breath and wheezing.   Cardiovascular: Negative for chest pain, palpitations,  orthopnea, claudication, leg swelling and PND.  Gastrointestinal: Positive for blood in stool (history of internal hemorrhoids, saw some blood after straining to have a BM, only happened 1 times.) and heartburn (minimal). Negative for abdominal pain, constipation, diarrhea, melena, nausea and vomiting.  Neurological: Negative for dizziness, weakness and headaches.  Endo/Heme/Allergies: Negative.   Psychiatric/Behavioral: Negative.    Objective:  BP 140/70   Pulse 68   Temp 97.6 F (36.4 C) (Oral)   Resp 16   Wt 200 lb (90.7 kg)   BMI 35.43 kg/m   Physical Exam  Constitutional: She is oriented to person, place, and time and well-developed, well-nourished, and in no distress.  HENT:  Head: Normocephalic and atraumatic.  Right Ear: External ear normal.  Left Ear: External ear normal.  Nose: Nose normal.  Eyes: Pupils are equal, round, and reactive to light.  Neck: Normal range of motion. Neck supple.  Cardiovascular: Normal rate, regular rhythm and normal heart sounds.   Pulmonary/Chest: Effort normal and breath sounds normal.  Abdominal: Soft. Bowel sounds are normal.  Neurological: She is alert and oriented to person, place, and time.  Skin: Skin is warm and dry.  Psychiatric: Mood, memory, affect and judgment normal.    Assessment and Plan :  Iron deficiency anemia Recheck CBC. Stop iron if it is normal. Recheck 2-3 months GERD Improved. I have done the exam and reviewed the above chart and it is accurate to the best of my knowledge.    Patient was seen and examined by Dr. Delfino Lovett L. Cranford Mon and the note was scribed by Althea Charon, RMA.   Miguel Aschoff MD [facility] 04/28/2016 11:24 AM

## 2016-04-29 LAB — CBC WITH DIFFERENTIAL/PLATELET
BASOS: 0 %
Basophils Absolute: 0 10*3/uL (ref 0.0–0.2)
EOS (ABSOLUTE): 0.2 10*3/uL (ref 0.0–0.4)
Eos: 2 %
Hematocrit: 40.6 % (ref 34.0–46.6)
Hemoglobin: 13.4 g/dL (ref 11.1–15.9)
Immature Grans (Abs): 0 10*3/uL (ref 0.0–0.1)
Immature Granulocytes: 0 %
LYMPHS ABS: 2.5 10*3/uL (ref 0.7–3.1)
Lymphs: 30 %
MCH: 29.5 pg (ref 26.6–33.0)
MCHC: 33 g/dL (ref 31.5–35.7)
MCV: 89 fL (ref 79–97)
MONOS ABS: 0.7 10*3/uL (ref 0.1–0.9)
Monocytes: 8 %
NEUTROS ABS: 4.9 10*3/uL (ref 1.4–7.0)
NEUTROS PCT: 60 %
PLATELETS: 339 10*3/uL (ref 150–379)
RBC: 4.54 x10E6/uL (ref 3.77–5.28)
RDW: 14.9 % (ref 12.3–15.4)
WBC: 8.3 10*3/uL (ref 3.4–10.8)

## 2016-04-29 LAB — IRON: Iron: 153 ug/dL — ABNORMAL HIGH (ref 27–139)

## 2016-05-03 ENCOUNTER — Telehealth: Payer: Self-pay

## 2016-05-03 NOTE — Telephone Encounter (Signed)
-----   Message from Jerrol Banana., MD sent at 04/29/2016  4:01 PM EDT ----- CBC ok--ok to stop iron

## 2016-05-03 NOTE — Telephone Encounter (Signed)
Pt advised.   Thanks,   -Romney Compean  

## 2016-05-31 ENCOUNTER — Encounter: Payer: Medicare Other | Admitting: Family Medicine

## 2016-06-28 ENCOUNTER — Other Ambulatory Visit: Payer: Self-pay | Admitting: General Surgery

## 2016-06-28 ENCOUNTER — Telehealth: Payer: Self-pay | Admitting: Family Medicine

## 2016-06-28 DIAGNOSIS — Z1231 Encounter for screening mammogram for malignant neoplasm of breast: Secondary | ICD-10-CM

## 2016-06-28 NOTE — Telephone Encounter (Addendum)
Pt sent a note with my chart stating she rec'd a flu vaccine on May 09, 2016 at [facility], [address], [place] [address].  Pt is requesting this information put in her chart/MW

## 2016-06-28 NOTE — Telephone Encounter (Signed)
We can put this note in her chart stating that she told us she had a vaccine.  But to put it in her immunization record we will have to wait on the pharmacy to send [place] proper documentation.

## 2016-07-05 ENCOUNTER — Encounter: Payer: Self-pay | Admitting: *Deleted

## 2016-07-24 NOTE — Progress Notes (Signed)
[place]  [phone] [phone]  ID: Claire Kane OB: [birth year]  MR#: [id number]  [id number]  Patient Care Team: Jerrol Banana., MD as PCP - General (Family Medicine) Christene Lye, MD (General Surgery) Jerrol Banana., MD (Family Medicine)  CHIEF COMPLAINT: Iron deficiency anemia.  INTERVAL HISTORY: Patient returns to clinic today for repeat laboratory work and further evaluation. She continues to feel well and is asymptomatic. She has no neurologic complaints. She denies any recent fevers or illnesses. She has a good appetite and denies weight loss. She denies any chest pain or shortness of breath. She denies any nausea, vomiting, constipation, or diarrhea. She has no melena or hematochezia. She has no urinary complaints. Patient offers no specific complaints today.  REVIEW OF SYSTEMS:   Review of Systems  Constitutional: Negative.  Negative for fever, malaise/fatigue and weight loss.  Respiratory: Negative.  Negative for hemoptysis and shortness of breath.   Cardiovascular: Negative.  Negative for chest pain.  Gastrointestinal: Negative.  Negative for blood in stool and melena.  Genitourinary: Negative.  Negative for hematuria.  Musculoskeletal: Negative.   Neurological: Negative.  Negative for weakness.  Psychiatric/Behavioral: Negative.     As per HPI. Otherwise, a complete review of systems is negative.  PAST MEDICAL HISTORY: Past Medical History:  Diagnosis Date  . Arthritis   . 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 Heart murmur 2001  . High cholesterol   . Rosacea   . 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 complications     PAST SURGICAL HISTORY: Past Surgical History:  Procedure Laterality Date  . ABDOMINAL HYSTERECTOMY    . BREAST CYST ASPIRATION Right    2003  . BREAST EXCISIONAL BIOPSY Right    positive 2002  . BREAST SURGERY Right 2002   lumpectomy  . CHOLECYSTECTOMY      . COLONOSCOPY  2013  . COLONOSCOPY WITH PROPOFOL N/A 12/08/2015   Procedure: COLONOSCOPY WITH PROPOFOL;  Surgeon: Christene Lye, MD;  Location: [facility] ENDOSCOPY;  Service: Endoscopy;  Laterality: N/A;  . ESOPHAGOGASTRODUODENOSCOPY (EGD) WITH PROPOFOL N/A 12/08/2015   Procedure: ESOPHAGOGASTRODUODENOSCOPY (EGD) WITH PROPOFOL;  Surgeon: Christene Lye, MD;  Location: [facility] ENDOSCOPY;  Service: Endoscopy;  Laterality: N/A;  . EYE SURGERY Right 2014  . post radiation therapy Right    right breast cancer 2002  . Stab pheblectomy   2010  . vein closure procedure Bilateral 2008    FAMILY HISTORY Family History  Problem Relation Age of Onset  . Heart attack Mother   . Heart attack Father   . Heart attack Sister   . Stroke Sister   . Heart attack Brother   . Other Brother     cerebral hemorrhage, sepsis  . Breast cancer Cousin 67    breast/maternal  . Cancer Cousin 65    breast/maternal  . Breast cancer Maternal Aunt 70  . Breast cancer Other 48    maternal neice       ADVANCED DIRECTIVES:    HEALTH MAINTENANCE: Social History  Substance Use Topics  . Smoking status: Never Smoker  . Smokeless tobacco: Never Used  . Alcohol use Yes     Comment: monthly or less 1 to 2 drinks at that time     Colonoscopy:  PAP:  Bone density:  Lipid panel:  No Known Allergies  Current Outpatient Prescriptions  Medication Sig Dispense Refill  . atorvastatin (LIPITOR) 10 MG tablet Take 10  mg by mouth daily.    . calcium citrate (CALCITRATE - DOSED IN MG ELEMENTAL CALCIUM) 950 MG tablet Take 200 mg of elemental calcium by mouth daily.    . clotrimazole-betamethasone (LOTRISONE) cream Apply 1 application topically 2 (two) times daily. 30 g 0  . ezetimibe (ZETIA) 10 MG tablet Take 10 mg by mouth daily.    Marland Kitchen glucosamine-chondroitin 500-400 MG tablet Take 1 tablet by mouth 3 (three) times daily.    . Multiple Vitamins-Minerals (MULTIVITAMIN WITH MINERALS) tablet Take 1 tablet by  mouth daily.    . Omega-3 Fatty Acids (FISH OIL BURP-LESS) 1000 MG CAPS Take by mouth.    Marland Kitchen omeprazole (PRILOSEC) 20 MG capsule take 1 capsule by mouth once daily 30 capsule 11  . ORACEA 40 MG capsule PRN    . psyllium (REGULOID) 0.52 G capsule Take 0.52 g by mouth daily.    . ranitidine (ZANTAC) 150 MG tablet Take 1 tablet (150 mg total) by mouth 2 (two) times daily. 60 tablet 12  . SOOLANTRA 1 % CREA     . spironolactone (ALDACTONE) 50 MG tablet     . sucralfate (CARAFATE) 1 g tablet Take 1 tablet (1 g total) by mouth 4 (four) times daily -  with meals and at bedtime. 120 tablet 3  . VANIQA 13.9 % cream APPLY TWICE DAILY TO UNWANTED HAIR  3  . WELCHOL 625 MG tablet Take 1 tablet by mouth daily.     No current facility-administered medications for this visit.     OBJECTIVE: Vitals:   07/25/16 1120  BP: 120/76  Pulse: 76  Temp: 97.8 F (36.6 C)     Body mass index is 35.23 kg/m.    ECOG FS:0 - Asymptomatic  General: Well-developed, well-nourished, no acute distress. Eyes: Pink conjunctiva, anicteric sclera. Lungs: Clear to auscultation bilaterally. Heart: Regular rate and rhythm. No rubs, murmurs, or gallops. Abdomen: Soft, nontender, nondistended. No organomegaly noted, normoactive bowel sounds. Musculoskeletal: No edema, cyanosis, or clubbing. Neuro: Alert, answering all questions appropriately. Cranial nerves grossly intact. Skin: No rashes or petechiae noted. Psych: Normal affect.   LAB RESULTS:  Lab Results  Component Value Date   NA 140 11/04/2015   K 4.5 11/04/2015   CL 101 11/04/2015   CO2 23 11/04/2015   GLUCOSE 99 11/04/2015   BUN 15 11/04/2015   CREATININE 0.84 11/04/2015   CALCIUM 9.1 11/04/2015   PROT 7.1 11/10/2015   ALBUMIN 4.5 11/04/2015   AST 18 11/04/2015   ALT 17 11/04/2015   ALKPHOS 65 11/04/2015   BILITOT 0.6 11/04/2015   GFRNONAA 69 11/04/2015   GFRAA 79 11/04/2015    Lab Results  Component Value Date   WBC 10.4 07/25/2016   NEUTROABS  6.3 07/25/2016   HGB 14.1 07/25/2016   HCT 41.5 07/25/2016   MCV 86.0 07/25/2016   PLT 343 07/25/2016   Lab Results  Component Value Date   IRON 85 07/25/2016   TIBC 444 07/25/2016   IRONPCTSAT 19 07/25/2016    Lab Results  Component Value Date   FERRITIN 11 07/25/2016     STUDIES: No results found.  ASSESSMENT: Iron deficiency anemia, thrombocytosis. Resolved.  PLAN:    1. Iron deficiency anemia: Patient's hemoglobin and iron stores Continue to be within normal limits. The remainder of her anemia workup including B12, folate, SPEP, and hemolysis labs are all either negative or within normal limits. EGD and colonoscopy in March 2017 are reported as negative. No intervention is needed at  this time. Patient discontinued her oral iron supplementation several months ago. She does not need to reinitiate treatment unless her hemoglobin begins to trend down again. No further follow-up is necessary. Please refer patient back if there are any questions or concerns.  2. Thrombocytosis: Likely secondary to iron deficiency anemia. Resolved.   Patient expressed understanding and was in agreement with this plan. She also understands that She can call clinic at any time with any questions, concerns, or complaints.    Lloyd Huger, MD   07/25/2016 12:29 PM

## 2016-07-25 ENCOUNTER — Inpatient Hospital Stay: Payer: Medicare Other | Attending: Oncology | Admitting: Oncology

## 2016-07-25 ENCOUNTER — Encounter: Payer: Self-pay | Admitting: Oncology

## 2016-07-25 ENCOUNTER — Inpatient Hospital Stay (HOSPITAL_BASED_OUTPATIENT_CLINIC_OR_DEPARTMENT_OTHER): Payer: Medicare Other

## 2016-07-25 VITALS — BP 120/76 | HR 76 | Temp 97.8°F | Wt 198.9 lb

## 2016-07-25 DIAGNOSIS — E7800 Pure hypercholesterolemia, unspecified: Secondary | ICD-10-CM | POA: Insufficient documentation

## 2016-07-25 DIAGNOSIS — Z853 Personal history of malignant neoplasm of breast: Secondary | ICD-10-CM

## 2016-07-25 DIAGNOSIS — Z923 Personal history of irradiation: Secondary | ICD-10-CM | POA: Diagnosis not present

## 2016-07-25 DIAGNOSIS — D509 Iron deficiency anemia, unspecified: Secondary | ICD-10-CM | POA: Insufficient documentation

## 2016-07-25 DIAGNOSIS — M1990 Unspecified osteoarthritis, unspecified site: Secondary | ICD-10-CM | POA: Diagnosis not present

## 2016-07-25 DIAGNOSIS — Z79899 Other long term (current) drug therapy: Secondary | ICD-10-CM | POA: Insufficient documentation

## 2016-07-25 DIAGNOSIS — D473 Essential (hemorrhagic) thrombocythemia: Secondary | ICD-10-CM | POA: Insufficient documentation

## 2016-07-25 DIAGNOSIS — R7989 Other specified abnormal findings of blood chemistry: Secondary | ICD-10-CM

## 2016-07-25 LAB — FERRITIN: FERRITIN: 11 ng/mL (ref 11–307)

## 2016-07-25 LAB — CBC WITH DIFFERENTIAL/PLATELET
BASOS PCT: 1 %
Basophils Absolute: 0.1 10*3/uL (ref 0–0.1)
EOS ABS: 0.2 10*3/uL (ref 0–0.7)
EOS PCT: 2 %
HCT: 41.5 % (ref 35.0–47.0)
Hemoglobin: 14.1 g/dL (ref 12.0–16.0)
Lymphocytes Relative: 27 %
Lymphs Abs: 2.8 10*3/uL (ref 1.0–3.6)
MCH: 29.2 pg (ref 26.0–34.0)
MCHC: 34 g/dL (ref 32.0–36.0)
MCV: 86 fL (ref 80.0–100.0)
MONOS PCT: 10 %
Monocytes Absolute: 1 10*3/uL — ABNORMAL HIGH (ref 0.2–0.9)
Neutro Abs: 6.3 10*3/uL (ref 1.4–6.5)
Neutrophils Relative %: 60 %
PLATELETS: 343 10*3/uL (ref 150–440)
RBC: 4.83 MIL/uL (ref 3.80–5.20)
RDW: 13.4 % (ref 11.5–14.5)
WBC: 10.4 10*3/uL (ref 3.6–11.0)

## 2016-07-25 LAB — IRON AND TIBC
IRON: 85 ug/dL (ref 28–170)
Saturation Ratios: 19 % (ref 10.4–31.8)
TIBC: 444 ug/dL (ref 250–450)
UIBC: 359 ug/dL

## 2016-08-01 ENCOUNTER — Other Ambulatory Visit: Payer: Self-pay | Admitting: General Surgery

## 2016-08-01 ENCOUNTER — Ambulatory Visit
Admission: RE | Admit: 2016-08-01 | Discharge: 2016-08-01 | Disposition: A | Discharge status: Home / Self Care | Payer: Medicare Other | Source: Ambulatory Visit | Attending: General Surgery | Admitting: General Surgery

## 2016-08-01 DIAGNOSIS — Z1231 Encounter for screening mammogram for malignant neoplasm of breast: Secondary | ICD-10-CM | POA: Insufficient documentation

## 2016-08-01 DIAGNOSIS — R928 Other abnormal and inconclusive findings on diagnostic imaging of breast: Secondary | ICD-10-CM | POA: Insufficient documentation

## 2016-08-01 HISTORY — DX: Personal history of irradiation: Z92.3

## 2016-08-01 HISTORY — DX: Encounter for nonprocreative screening for genetic disease carrier status: Z13.71

## 2016-08-01 HISTORY — DX: Malignant neoplasm of unspecified site of unspecified female breast: C50.919

## 2016-08-02 ENCOUNTER — Other Ambulatory Visit: Payer: Self-pay | Admitting: General Surgery

## 2016-08-02 DIAGNOSIS — N6320 Unspecified lump in the left breast, unspecified quadrant: Secondary | ICD-10-CM

## 2016-08-10 ENCOUNTER — Ambulatory Visit: Payer: Medicare Other | Admitting: General Surgery

## 2016-08-16 ENCOUNTER — Ambulatory Visit: Payer: Medicare Other | Admitting: General Surgery

## 2016-08-19 ENCOUNTER — Ambulatory Visit
Admission: RE | Admit: 2016-08-19 | Discharge: 2016-08-19 | Disposition: A | Discharge status: Home / Self Care | Payer: Medicare Other | Source: Ambulatory Visit | Attending: General Surgery | Admitting: General Surgery

## 2016-08-19 DIAGNOSIS — N6002 Solitary cyst of left breast: Secondary | ICD-10-CM | POA: Insufficient documentation

## 2016-08-19 DIAGNOSIS — N6320 Unspecified lump in the left breast, unspecified quadrant: Secondary | ICD-10-CM

## 2016-08-25 ENCOUNTER — Ambulatory Visit (INDEPENDENT_AMBULATORY_CARE_PROVIDER_SITE_OTHER): Payer: Medicare Other | Admitting: General Surgery

## 2016-08-25 ENCOUNTER — Encounter: Payer: Self-pay | Admitting: General Surgery

## 2016-08-25 VITALS — BP 124/78 | HR 78 | Resp 14 | Ht 61.0 in | Wt 197.0 lb

## 2016-08-25 DIAGNOSIS — Z86000 Personal history of in-situ neoplasm of breast: Secondary | ICD-10-CM

## 2016-08-25 NOTE — Patient Instructions (Addendum)
   Advised her on avoiding full meals-try smaller more frequent meals. The patient has been asked to return to the office in one year with a bilateral screening mammogram.

## 2016-08-25 NOTE — Progress Notes (Signed)
Patient ID: Claire Kane, female   DOB: 1941-06-07, 75 y.o.   MRN: [id number]  Chief Complaint  Patient presents with  . Follow-up    HPI Claire Kane is a 75 y.o. female who presents for  breast cancer follow up. Also has gastric symptoms which are ongoing. The most recent mammogram was done on 08/01/2016 and added views and left breast ultrasound. 08/19/2016.  Patient does perform regular self breast checks and gets regular mammograms done. She states she is having a lot of heart burn lately, burping and gurgling in left upper abdomen, often worse after eating. I have reviewed the history of present illness with the patient.  HPI  Past Medical History:  Diagnosis Date  . Arthritis   . BRCA negative    PER PT  . Breast cancer ([place]) 2002   DCIS right breast   . Heart murmur 2001  . High cholesterol   . Personal history of radiation therapy 2002   BREAST CA  . Rosacea   . 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 complications     Past Surgical History:  Procedure Laterality Date  . ABDOMINAL HYSTERECTOMY    . BREAST CYST ASPIRATION Left 2003  . BREAST EXCISIONAL BIOPSY Right 2002   POS  . BREAST SURGERY Right 2002   lumpectomy  . CHOLECYSTECTOMY    . COLONOSCOPY  2013  . COLONOSCOPY WITH PROPOFOL N/A 12/08/2015   Procedure: COLONOSCOPY WITH PROPOFOL;  Surgeon: Christene Lye, MD;  Location: [facility] ENDOSCOPY;  Service: Endoscopy;  Laterality: N/A;  . ESOPHAGOGASTRODUODENOSCOPY (EGD) WITH PROPOFOL N/A 12/08/2015   Procedure: ESOPHAGOGASTRODUODENOSCOPY (EGD) WITH PROPOFOL;  Surgeon: Christene Lye, MD;  Location: [facility] ENDOSCOPY;  Service: Endoscopy;  Laterality: N/A;  . EYE SURGERY Right 2014  . post radiation therapy Right    right breast cancer 2002  . Stab pheblectomy   2010  . vein closure procedure Bilateral 2008    Family History  Problem Relation Age of Onset  . Heart attack Mother   . Heart attack Father   . Heart attack Sister   . Stroke  Sister   . Heart attack Brother   . Other Brother     cerebral hemorrhage, sepsis  . Breast cancer Cousin 31    breast/maternal  . Cancer Cousin 22    breast/maternal  . Breast cancer Maternal Aunt 70  . Breast cancer Other 48    maternal neice    Social History Social History  Substance Use Topics  . Smoking status: Never Smoker  . Smokeless tobacco: Never Used  . Alcohol use Yes     Comment: monthly or less 1 to 2 drinks at that time    No Known Allergies  Current Outpatient Prescriptions  Medication Sig Dispense Refill  . atorvastatin (LIPITOR) 10 MG tablet Take 10 mg by mouth daily.    . calcium citrate (CALCITRATE - DOSED IN MG ELEMENTAL CALCIUM) 950 MG tablet Take 200 mg of elemental calcium by mouth daily.    . clotrimazole-betamethasone (LOTRISONE) cream Apply 1 application topically 2 (two) times daily. 30 g 0  . ezetimibe (ZETIA) 10 MG tablet Take 10 mg by mouth daily.    Marland Kitchen glucosamine-chondroitin 500-400 MG tablet Take 1 tablet by mouth 3 (three) times daily.    . Multiple Vitamins-Minerals (MULTIVITAMIN WITH MINERALS) tablet Take 1 tablet by mouth daily.    . Omega-3 Fatty Acids (FISH OIL BURP-LESS) 1000 MG CAPS Take by mouth.    Marland Kitchen  omeprazole (PRILOSEC) 20 MG capsule take 1 capsule by mouth once daily 30 capsule 11  . ORACEA 40 MG capsule PRN    . psyllium (REGULOID) 0.52 G capsule Take 0.52 g by mouth daily.    . ranitidine (ZANTAC) 150 MG tablet Take 1 tablet (150 mg total) by mouth 2 (two) times daily. 60 tablet 12  . SOOLANTRA 1 % CREA     . spironolactone (ALDACTONE) 50 MG tablet     . sucralfate (CARAFATE) 1 g tablet Take 1 tablet (1 g total) by mouth 4 (four) times daily -  with meals and at bedtime. 120 tablet 3  . VANIQA 13.9 % cream APPLY TWICE DAILY TO UNWANTED HAIR  3  . WELCHOL 625 MG tablet Take 6 tablets by mouth daily.      No current facility-administered medications for this visit.     Review of Systems Review of Systems  Constitutional:  Negative.   Respiratory: Negative.   Cardiovascular: Negative.     Blood pressure 124/78, pulse 78, resp. rate 14, height 5' 1"  (1.549 m), weight 197 lb (89.4 kg).  Physical Exam Physical Exam  Constitutional: She is oriented to person, place, and time. She appears well-developed and well-nourished.  Eyes: Conjunctivae are normal. No scleral icterus.  Neck: Neck supple.  Cardiovascular: Normal rate, regular rhythm and normal heart sounds.   Pulmonary/Chest: Effort normal and breath sounds normal. Right breast exhibits no inverted nipple, no mass, no nipple discharge, no skin change and no tenderness. Left breast exhibits no inverted nipple, no mass, no nipple discharge, no skin change and no tenderness.  Abdominal: Soft. Normal appearance and bowel sounds are normal. There is no hepatomegaly. There is no tenderness.  Lymphadenopathy:    She has no cervical adenopathy.    She has no axillary adenopathy.  Neurological: She is alert and oriented to person, place, and time.  Skin: Skin is warm and dry.    Data Reviewed Mammogram reviewed- tiny cyst left retroareolar   Assessment    Stable exam, patient is 15 years post right breast lumpectomy and radiation for DCIS.  Colonoscopy was normal in March this yr. Upper endoscopy showed a moderate sized hiatal hernia and mild focal non specific gastritis Plan   her symptoms are likely from hiatal hernia. Advised her on avoiding full meals-try smaller more frequent meals. The patient has been asked to return to the office in one year with a bilateral screening mammogram. This information has been scribed by [name].        [name] 08/25/2016, 11:55 AM

## 2016-08-30 ENCOUNTER — Encounter: Payer: Self-pay | Admitting: Family Medicine

## 2016-08-30 ENCOUNTER — Ambulatory Visit (INDEPENDENT_AMBULATORY_CARE_PROVIDER_SITE_OTHER): Payer: Medicare Other | Admitting: Family Medicine

## 2016-08-30 VITALS — BP 122/70 | HR 70 | Temp 97.7°F | Resp 16 | Wt 201.0 lb

## 2016-08-30 DIAGNOSIS — K219 Gastro-esophageal reflux disease without esophagitis: Secondary | ICD-10-CM | POA: Diagnosis not present

## 2016-08-30 DIAGNOSIS — D509 Iron deficiency anemia, unspecified: Secondary | ICD-10-CM | POA: Diagnosis not present

## 2016-08-30 DIAGNOSIS — K449 Diaphragmatic hernia without obstruction or gangrene: Secondary | ICD-10-CM

## 2016-08-30 DIAGNOSIS — R142 Eructation: Secondary | ICD-10-CM

## 2016-08-30 NOTE — Progress Notes (Signed)
Subjective:  HPI  Pt is here for a 4 month follow up. For anemia, and GERD. She reports that she stopped taking the Carafate. She is still taking the Omeprazole, and Zantac. She reports that when she was taking the Carafate she her symptoms were better but never completely resolved. She reports that she followed up with Dr. Grayland Ormond about the anemia, her levels were back to normal and she had been off the iron.   Prior to Admission medications   Medication Sig Start Date End Date Taking? Authorizing Provider  atorvastatin (LIPITOR) 10 MG tablet Take 10 mg by mouth daily.    Historical Provider, MD  calcium citrate (CALCITRATE - DOSED IN MG ELEMENTAL CALCIUM) 950 MG tablet Take 200 mg of elemental calcium by mouth daily.    Historical Provider, MD  clotrimazole-betamethasone (LOTRISONE) cream Apply 1 application topically 2 (two) times daily. 11/04/15   Richard Maceo Pro., MD  ezetimibe (ZETIA) 10 MG tablet Take 10 mg by mouth daily.    Historical Provider, MD  glucosamine-chondroitin 500-400 MG tablet Take 1 tablet by mouth 3 (three) times daily.    Historical Provider, MD  Multiple Vitamins-Minerals (MULTIVITAMIN WITH MINERALS) tablet Take 1 tablet by mouth daily.    Historical Provider, MD  Omega-3 Fatty Acids (FISH OIL BURP-LESS) 1000 MG CAPS Take by mouth.    Historical Provider, MD  omeprazole (PRILOSEC) 20 MG capsule take 1 capsule by mouth once daily 04/08/16   Jerrol Banana., MD  ORACEA 40 MG capsule PRN 03/02/15   Historical Provider, MD  psyllium (REGULOID) 0.52 G capsule Take 0.52 g by mouth daily.    Historical Provider, MD  ranitidine (ZANTAC) 150 MG tablet Take 1 tablet (150 mg total) by mouth 2 (two) times daily. 05/28/15   Richard Maceo Pro., MD  SOOLANTRA 1 % CREA  06/30/16   Historical Provider, MD  spironolactone (ALDACTONE) 50 MG tablet  12/29/14   Historical Provider, MD  sucralfate (CARAFATE) 1 g tablet Take 1 tablet (1 g total) by mouth 4 (four) times daily -   with meals and at bedtime. 03/16/16   Richard Maceo Pro., MD  VANIQA 13.9 % cream APPLY TWICE DAILY TO UNWANTED HAIR 06/30/16   Historical Provider, MD  WELCHOL 625 MG tablet Take 6 tablets by mouth daily.  05/21/13   Historical Provider, MD    Patient Active Problem List   Diagnosis Date Noted  . Iron deficiency anemia 04/28/2016  . Shortness of breath 11/13/2015  . Carotid artery narrowing 11/13/2015  . Cataract 11/04/2015  . Clinical depression 11/04/2015  . Dyslipidemia 11/04/2015  . 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 Acid reflux 11/04/2015  . Adiposity 11/04/2015  . 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 Awareness of heartbeats 11/04/2015  . Depression 08/19/2015  . Fibrocystic breast disease 08/19/2015  . Carotid stenosis 08/19/2015  . Osteoarthritis 08/19/2015  . Obesity 08/19/2015  . Hyperlipidemia 08/19/2015  . 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 Heart murmur, systolic 94/85/4627  . 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 Other specified symptoms and signs involving the circulatory and respiratory systems 06/17/2011  . Acid indigestion 06/17/2011  . Gout 06/17/2011  . Diarrhea, unspecified 06/17/2011    Past Medical History:  Diagnosis Date  . Arthritis   . BRCA negative    PER PT  . Breast cancer ([place])  2002   DCIS right breast   . Heart murmur 2001  . High cholesterol   . Personal history of radiation therapy 2002   BREAST CA  . Rosacea   . 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 complications     Social History   Social History  . Marital status: Married    Spouse name: N/A  . Number of children: N/A  . Years of education: N/A   Occupational History  . Not on file.   Social History Main Topics  . Smoking status: Never Smoker  . Smokeless tobacco: Never Used  . Alcohol use  Yes     Comment: monthly or less 1 to 2 drinks at that time  . Drug use: No  . Sexual activity: No   Other Topics Concern  . Not on file   Social History Narrative  . No narrative on file    No Known Allergies  Review of Systems  Constitutional: Negative.   HENT: Negative.   Eyes: Negative.   Respiratory: Negative.   Cardiovascular: Negative.   Gastrointestinal: Positive for heartburn and vomiting.  Genitourinary: Negative.   Musculoskeletal: Positive for back pain.  Skin: Negative.   Neurological: Negative.   Endo/Heme/Allergies: Negative.   Psychiatric/Behavioral: Negative.     Immunization History  Administered Date(s) Administered  . Influenza, High Dose Seasonal PF 05/28/2015  . Pneumococcal Conjugate-13 04/22/2014  . Pneumococcal Polysaccharide-23 03/15/2011  . Td 09/06/2007  . Tdap 04/22/2014  . Zoster 06/27/2011    Objective:  BP 122/70 (BP Location: Left Arm, Patient Position: Sitting, Cuff Size: Large)   Pulse 70   Temp 97.7 F (36.5 C) (Oral)   Resp 16   Wt 201 lb (91.2 kg)   BMI 37.98 kg/m   Physical Exam  Constitutional: She is oriented to person, place, and time and well-developed, well-nourished, and in no distress.  HENT:  Head: Normocephalic and atraumatic.  Eyes: Conjunctivae and EOM are normal. Pupils are equal, round, and reactive to light.  Neck: Normal range of motion. Neck supple.  Cardiovascular: Normal rate, regular rhythm, normal heart sounds and intact distal pulses.   Pulmonary/Chest: Effort normal and breath sounds normal.  Abdominal: Soft. Bowel sounds are normal.  Musculoskeletal: Normal range of motion.  Neurological: She is alert and oriented to person, place, and time. She has normal reflexes. Gait normal. GCS score is 15.  Skin: Skin is warm and dry.  Psychiatric: Mood, memory, affect and judgment normal.    Lab Results  Component Value Date   WBC 10.4 07/25/2016   HGB 14.1 07/25/2016   HCT 41.5 07/25/2016   PLT  343 07/25/2016   GLUCOSE 99 11/04/2015   CHOL 179 04/14/2014   TRIG 79 04/14/2014   HDL 63 04/14/2014   LDLCALC 100 04/14/2014   TSH 2.050 11/04/2015    CMP     Component Value Date/Time   NA 140 11/04/2015 1204   K 4.5 11/04/2015 1204   CL 101 11/04/2015 1204   CO2 23 11/04/2015 1204   GLUCOSE 99 11/04/2015 1204   BUN 15 11/04/2015 1204   CREATININE 0.84 11/04/2015 1204   CALCIUM 9.1 11/04/2015 1204   PROT 7.1 11/10/2015 1543   ALBUMIN 4.5 11/04/2015 1204   AST 18 11/04/2015 1204   ALT 17 11/04/2015 1204   ALKPHOS 65 11/04/2015 1204   BILITOT 0.6 11/04/2015 1204   GFRNONAA 69 11/04/2015 1204   GFRAA 79 11/04/2015 1204    Assessment and Plan :  1. Gastroesophageal reflux disease without esophagitis Still having symptoms. Pt to continue current medications. Follow up in 3-4 months. May refer to GI if not better or improving.  Elevating HOB worked/helped. 2. Belching   3. Hiatal hernia   4. Iron deficiency anemia, unspecified iron deficiency anemia type Resolved per Dr. Grayland Ormond.  5.HLD  HPI, Exam, and A&P Transcribed under the direction and in the presence of Richard L. Cranford Mon, MD  Electronically Signed: Webb Laws, [place] MD [facility] 08/30/2016 11:13 AM

## 2016-10-12 ENCOUNTER — Other Ambulatory Visit: Payer: Self-pay | Admitting: Family Medicine

## 2016-11-15 LAB — LIPID PANEL
CHOLESTEROL: 121 mg/dL (ref 0–200)
HDL: 46 mg/dL (ref 35–70)
LDL CALC: 56 mg/dL
Triglycerides: 93 mg/dL (ref 40–160)

## 2016-11-24 ENCOUNTER — Encounter: Payer: Self-pay | Admitting: Obstetrics & Gynecology

## 2016-11-24 ENCOUNTER — Ambulatory Visit (INDEPENDENT_AMBULATORY_CARE_PROVIDER_SITE_OTHER): Payer: Medicare Other | Admitting: Obstetrics & Gynecology

## 2016-11-24 VITALS — BP 130/80 | HR 72 | Ht 64.0 in | Wt 198.0 lb

## 2016-11-24 DIAGNOSIS — R32 Unspecified urinary incontinence: Secondary | ICD-10-CM

## 2016-11-24 DIAGNOSIS — N993 Prolapse of vaginal vault after hysterectomy: Secondary | ICD-10-CM

## 2016-11-24 DIAGNOSIS — N8111 Cystocele, midline: Secondary | ICD-10-CM

## 2016-11-24 NOTE — Progress Notes (Signed)
HPI:      [name] is a 76 y.o. G3P2 who presents today for her pessary follow up and examination related to her pelvic floor weakening.  Pt reports tolerating the pessary well with  no vaginal bleeding and scant vaginal discharge.  Symptoms of pelvic floor weakening have greatly improved. She is voiding and defecating without difficulty. Some constipation and pessary may come out w straining.  She currently has a cup#4 pessary.  PMHx: She  has a past medical history of Anemia; Arthritis; BRCA negative; Breast cancer ([place]) (2002); Heart murmur (2001); High cholesterol; Personal history of radiation therapy (2002); Rosacea; and 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 complications. Also,  has a past surgical history that includes Stab pheblectomy  (2010); vein closure procedure (Bilateral, 2008); Abdominal hysterectomy; Cholecystectomy; Breast surgery (Right, 2002); Eye surgery (Right, 2014); Colonoscopy (2013); post radiation therapy (Right); Colonoscopy with propofol (N/A, 12/08/2015); Esophagogastroduodenoscopy (egd) with propofol (N/A, 12/08/2015); Breast excisional biopsy (Right, 2002); and Breast cyst aspiration (Left, 2003)., family history includes Breast cancer (age of onset: 65) in her other; Breast cancer (age of onset: 45) in her cousin; Breast cancer (age of onset: 6) in her maternal aunt; Cancer (age of onset: 10) in her cousin; Heart attack in her brother, father, mother, and sister; Other in her brother; Stroke in her sister.,  reports that she has never smoked. She has never used smokeless tobacco. She reports that she drinks alcohol. She reports that she does not use drugs.  She has a current medication list which includes the following prescription(s): atorvastatin, calcium citrate, ezetimibe, glucosamine-chondroitin, multivitamin with minerals, fish oil burp-less, omeprazole, oracea, psyllium, ranitidine, soolantra, spironolactone, welchol, clotrimazole-betamethasone, ferrous  sulfate, sucralfate, and vaniqa. Also, has No Known Allergies.  Review of Systems  Constitutional: Negative for chills, fever and malaise/fatigue.  HENT: Negative for congestion, sinus pain and sore throat.   Eyes: Negative for blurred vision and pain.  Respiratory: Negative for cough and wheezing.   Cardiovascular: Negative for chest pain and leg swelling.  Gastrointestinal: Negative for abdominal pain, constipation, diarrhea, heartburn, nausea and vomiting.  Genitourinary: Negative for dysuria, frequency, hematuria and urgency.  Musculoskeletal: Negative for back pain, joint pain, myalgias and neck pain.  Skin: Negative for itching and rash.  Neurological: Negative for dizziness, tremors and weakness.  Endo/Heme/Allergies: Does not bruise/bleed easily.  Psychiatric/Behavioral: Negative for depression. The patient is not nervous/anxious and does not have insomnia.     Objective: Vitals:   11/24/16 1006  BP: 130/80  Pulse: 72   Physical Exam  Constitutional: She is oriented to person, place, and time. She appears well-developed and well-nourished. No distress.  Genitourinary: Vagina normal. Pelvic exam was performed with patient supine. There is no rash, tenderness or lesion on the right labia. There is no rash, tenderness or lesion on the left labia. No erythema or bleeding in the vagina.  Abdominal: Soft. She exhibits no distension. There is no tenderness.  Musculoskeletal: Normal range of motion.  Neurological: She is alert and oriented to person, place, and time. No cranial nerve deficit.  Skin: Skin is warm and dry.  Psychiatric: She has a normal mood and affect.    Pessary Care Pessary removed and cleaned.  Vagina checked - without erosions - pessary replaced.  A/P:  Pessary was cleaned and replaced today. Instructions given for care. Concerning symptoms to observe for are counseled to patient. Follow up scheduled for 3 - 4 months.   Barnett Applebaum, MD, [facility]  Ob/Gyn,  [facility] 11/24/2016  10:30 AM

## 2016-12-13 ENCOUNTER — Ambulatory Visit (INDEPENDENT_AMBULATORY_CARE_PROVIDER_SITE_OTHER): Payer: Medicare Other | Admitting: Family Medicine

## 2016-12-13 ENCOUNTER — Ambulatory Visit: Payer: Medicare Other | Admitting: Family Medicine

## 2016-12-13 ENCOUNTER — Ambulatory Visit: Payer: Medicare Other

## 2016-12-13 VITALS — BP 124/72 | HR 96 | Temp 97.5°F | Ht 64.0 in | Wt 201.4 lb

## 2016-12-13 VITALS — BP 124/72 | HR 96 | Temp 97.5°F | Wt 201.0 lb

## 2016-12-13 DIAGNOSIS — K219 Gastro-esophageal reflux disease without esophagitis: Secondary | ICD-10-CM

## 2016-12-13 DIAGNOSIS — D509 Iron deficiency anemia, unspecified: Secondary | ICD-10-CM

## 2016-12-13 DIAGNOSIS — M19041 Primary osteoarthritis, right hand: Secondary | ICD-10-CM | POA: Diagnosis not present

## 2016-12-13 DIAGNOSIS — E784 Other hyperlipidemia: Secondary | ICD-10-CM

## 2016-12-13 DIAGNOSIS — E7849 Other hyperlipidemia: Secondary | ICD-10-CM

## 2016-12-13 DIAGNOSIS — R5383 Other fatigue: Secondary | ICD-10-CM

## 2016-12-13 DIAGNOSIS — Z0000 Encounter for general adult medical examination without abnormal findings: Secondary | ICD-10-CM

## 2016-12-13 NOTE — Progress Notes (Signed)
Subjective:   Claire Kane is a 76 y.o. female who presents for Medicare Annual (Subsequent) preventive examination.  Review of Systems:  N/A Cardiac Risk Factors include: advanced age (>55mn, >>8women);dyslipidemia;obesity (BMI >30kg/m2)     Objective:     Vitals: BP 124/72 (BP Location: Right Arm)   Pulse 96   Temp 97.5 F (36.4 C) (Oral)   Ht _0  (1.626 m)   Wt 201 lb 6.4 oz (91.4 kg)   BMI 34.57 kg/m   Body mass index is 34.57 kg/m.   Tobacco History  Smoking Status  . Never Smoker  Smokeless Tobacco  . Never Used     Counseling given: Not Answered   Past Medical History:  Diagnosis Date  . Anemia   . Arthritis   . BRCA negative    PER PT  . [place]   DCIS right breast   . Heart murmur 2001  . High cholesterol   . Hypertension   . Personal history of radiation therapy 2002   BREAST CA  . Rosacea   . 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 complications    Past Surgical History:  Procedure Laterality Date  . ABDOMINAL HYSTERECTOMY    . BREAST CYST ASPIRATION Left 2003  . BREAST EXCISIONAL BIOPSY Right 2002   POS  . BREAST SURGERY Right 2002   lumpectomy  . CHOLECYSTECTOMY    . COLONOSCOPY  2013  . COLONOSCOPY WITH PROPOFOL N/A 12/08/2015   Procedure: COLONOSCOPY WITH PROPOFOL;  Surgeon: SChristene Lye MD;  Location: [facility] ENDOSCOPY;  Service: Endoscopy;  Laterality: N/A;  . ESOPHAGOGASTRODUODENOSCOPY (EGD) WITH PROPOFOL N/A 12/08/2015   Procedure: ESOPHAGOGASTRODUODENOSCOPY (EGD) WITH PROPOFOL;  Surgeon: SChristene Lye MD;  Location: [facility] ENDOSCOPY;  Service: Endoscopy;  Laterality: N/A;  . EYE SURGERY Right 2014  . post radiation therapy Right    right breast cancer 2002  . Stab pheblectomy   2010  . vein closure procedure Bilateral 2008   Family History  Problem Relation Age of Onset  . Heart attack Mother   . Heart attack Father   . Heart attack Sister   . Stroke Sister   . Heart attack  Brother   . Other Brother     cerebral hemorrhage, sepsis  . Breast cancer Cousin 525   breast/maternal  . Cancer Cousin 65   breast/maternal  . Breast cancer Maternal Aunt 70  . Breast cancer Other 48    maternal neice   History  Sexual Activity  . Sexual activity: No    Outpatient Encounter Prescriptions as of 12/13/2016  Medication Sig  . aspirin EC 81 MG tablet Take 81 mg by mouth daily.  .Marland Kitchenatorvastatin (LIPITOR) 10 MG tablet Take 10 mg by mouth daily.  . calcium citrate (CALCITRATE - DOSED IN MG ELEMENTAL CALCIUM) 950 MG tablet Take 200 mg of elemental calcium by mouth 2 (two) times daily.   .Marland Kitchenezetimibe (ZETIA) 10 MG tablet Take 10 mg by mouth daily.  .Marland Kitchenglucosamine-chondroitin 500-400 MG tablet Take 1 tablet by mouth 3 (three) times daily.  . Multiple Vitamins-Minerals (MULTIVITAMIN WITH MINERALS) tablet Take 1 tablet by mouth daily.  . Omega-3 Fatty Acids (FISH OIL BURP-LESS) 1000 MG CAPS Take by mouth.  .Marland Kitchenomeprazole (PRILOSEC) 20 MG capsule take 1 capsule by mouth once daily  . ORACEA 40 MG capsule PRN  . psyllium (REGULOID) 0.52 G capsule Take 0.52 g by mouth daily.  . ranitidine (ZANTAC)  150 MG tablet take 1 tablet by mouth twice a day (Patient taking differently: take 1 tablet by mouth once a day)  . SOOLANTRA 1 % CREA   . spironolactone (ALDACTONE) 50 MG tablet 50 mg 2 (two) times daily.   [name] 625 MG tablet Take 2 tablets by mouth 2 (two) times daily with a meal.   . clotrimazole-betamethasone (LOTRISONE) cream Apply 1 application topically 2 (two) times daily. (Patient not taking: Reported on 11/24/2016)  . ferrous sulfate 325 (65 FE) MG tablet Take by mouth.  . sucralfate (CARAFATE) 1 g tablet Take 1 tablet (1 g total) by mouth 4 (four) times daily -  with meals and at bedtime. (Patient not taking: Reported on 08/30/2016)  . VANIQA 13.9 % cream APPLY TWICE DAILY TO UNWANTED HAIR   No facility-administered encounter medications on file as of 12/13/2016.      Activities of Daily Living In your present state of health, do you have any difficulty performing the following activities: 12/13/2016 03/16/2016  Hearing? N N  Vision? N N  Difficulty concentrating or making decisions? N Y  Walking or climbing stairs? N Y  Dressing or bathing? N N  Doing errands, shopping? N N  Preparing Food and eating ? N -  Using the Toilet? N -  In the past six months, have you accidently leaked urine? Y -  Do you have problems with loss of bowel control? N -  Managing your Medications? N -  Managing your Finances? N -  Housekeeping or managing your Housekeeping? N -  Some recent data might be hidden    Patient Care Team: Jerrol Banana., MD as PCP - General (Family Medicine) Seeplaputhur Robinette Haines, MD (General Surgery) Thomes Dinning, MD as Referring Physician (Internal Medicine) Estill Cotta, MD as Consulting Physician (Ophthalmology) Gae Dry, MD as Referring Physician (Obstetrics and Gynecology)    Assessment:    Exercise Activities and Dietary recommendations Current Exercise Habits: The patient does not participate in regular exercise at present, Exercise limited by: None identified  Goals    . Exercise           Recommend increasing exercise by walking 4 days a week for 25 minutes.       Fall Risk Fall Risk  12/13/2016 03/16/2016 05/28/2015  Falls in the past year? Yes No Yes  Number falls in past yr: 1 - 1  Injury with Fall? Yes - No  Follow up Falls prevention discussed - -   Depression Screen PHQ 2/9 Scores 12/13/2016 12/13/2016 03/16/2016 05/28/2015  PHQ - 2 Score 0 0 0 0  PHQ- 9 Score 0 - - -     Cognitive Function     6CIT Screen 12/13/2016  What Year? 0 points  What month? 0 points  What time? 0 points  Count back from 20 0 points  Months in reverse 0 points  Repeat phrase 0 points  Total Score 0    Immunization History  Administered Date(s) Administered  . Influenza, High Dose Seasonal PF 05/28/2015  .  Pneumococcal Conjugate-13 04/22/2014  . Pneumococcal Polysaccharide-23 03/15/2011  . Td 09/06/2007  . Tdap 04/22/2014  . Zoster 06/27/2011   Screening Tests Health Maintenance  Topic Date Due  . INFLUENZA VACCINE  12/17/2016 (Originally 04/19/2016)  . TETANUS/TDAP  04/22/2024  . COLONOSCOPY  12/07/2025  . DEXA SCAN  Completed  . PNA vac Low Risk Adult  Completed      Plan:  I have  personally reviewed and addressed the Medicare Annual Wellness questionnaire and have noted the following in the patient's chart:  A. Medical and social history B. Use of alcohol, tobacco or illicit drugs  C. Current medications and supplements D. Functional ability and status E.  Nutritional status F.  Physical activity G. Advance directives H. List of other physicians I.  Hospitalizations, surgeries, and ER visits in previous 12 months J.  [place] such as hearing and vision if needed, cognitive and depression L. Referrals and appointments - none  In addition, I have reviewed and discussed with patient certain preventive protocols, quality metrics, and best practice recommendations. A written personalized care plan for preventive services as well as general preventive health recommendations were provided to patient.  See attached scanned questionnaire for additional information.   Signed,  Fabio Neighbors, LPN Nurse Health Advisor   MD Recommendations: None. I have reviewed the health advisors note, was  available for consultation and I agree with documentation and plan. Miguel Aschoff MD [facility]

## 2016-12-13 NOTE — Patient Instructions (Signed)
Claire Kane , Thank you for taking time to come for your Medicare Wellness Visit. I appreciate your ongoing commitment to your health goals. Please review the following plan we discussed and let me know if I can assist you in the future.   Screening recommendations/referrals: Colonoscopy: done 11/18/15 Mammogram: done 08/19/16 Bone Density: done 04/05/12 Recommended yearly ophthalmology/optometry visit for glaucoma screening and checkup Recommended yearly dental visit for hygiene and checkup  Vaccinations: Influenza vaccine: done 04/2016 Pneumococcal vaccine: series completed Tdap vaccine: done 04/22/14 Shingles vaccine: completed 06/27/11   Advanced directives: Requested copy.  Conditions/risks identified: Fall risk prevention  Next appointment: 03/16/17   Preventive Care 65 Years and Older, Female Preventive care refers to lifestyle choices and visits with your health care provider that can promote health and wellness. What does preventive care include?  A yearly physical exam. This is also called an annual well check.  Dental exams once or twice a year.  Routine eye exams. Ask your health care provider how often you should have your eyes checked.  Personal lifestyle choices, including:  Daily care of your teeth and gums.  Regular physical activity.  Eating a healthy diet.  Avoiding tobacco and drug use.  Limiting alcohol use.  Practicing safe sex.  Taking low-dose aspirin every day.  Taking vitamin and mineral supplements as recommended by your health care provider. What happens during an annual well check? The services and screenings done by your health care provider during your annual well check will depend on your age, overall health, lifestyle risk factors, and family history of disease. Counseling  Your health care provider may ask you questions about your:  Alcohol use.  Tobacco use.  Drug use.  Emotional well-being.  Home and relationship  well-being.  Sexual activity.  Eating habits.  History of falls.  Memory and ability to understand (cognition).  Work and work Statistician.  Reproductive health. Screening  You may have the following tests or measurements:  Height, weight, and BMI.  Blood pressure.  Lipid and cholesterol levels. These may be checked every 5 years, or more frequently if you are over 52 years old.  Skin check.  Lung cancer screening. You may have this screening every year starting at age 24 if you have a 30-pack-year history of smoking and currently smoke or have quit within the past 15 years.  Fecal occult blood test (FOBT) of the stool. You may have this test every year starting at age 12.  Flexible sigmoidoscopy or colonoscopy. You may have a sigmoidoscopy every 5 years or a colonoscopy every 10 years starting at age 19.  Hepatitis C blood test.  Hepatitis B blood test.  Sexually transmitted disease (STD) testing.  Diabetes screening. This is done by checking your blood sugar (glucose) after you have not eaten for a while (fasting). You may have this done every 1-3 years.  Bone density scan. This is done to screen for osteoporosis. You may have this done starting at age 38.  Mammogram. This may be done every 1-2 years. Talk to your health care provider about how often you should have regular mammograms. Talk with your health care provider about your test results, treatment options, and if necessary, the need for more tests. Vaccines  Your health care provider may recommend certain vaccines, such as:  Influenza vaccine. This is recommended every year.  Tetanus, diphtheria, and acellular pertussis (Tdap, Td) vaccine. You may need a Td booster every 10 years.  Zoster vaccine. You may need this after  age 70.  Pneumococcal 13-valent conjugate (PCV13) vaccine. One dose is recommended after age 72.  Pneumococcal polysaccharide (PPSV23) vaccine. One dose is recommended after age  83. Talk to your health care provider about which screenings and vaccines you need and how often you need them. This information is not intended to replace advice given to you by your health care provider. Make sure you discuss any questions you have with your health care provider. Document Released: 10/02/2015 Document Revised: 05/25/2016 Document Reviewed: 07/07/2015 Elsevier Interactive Patient Education  2017 [place] Prevention in the Home Falls can cause injuries. They can happen to people of all ages. There are many things you can do to make your home safe and to help prevent falls. What can I do on the outside of my home?  Regularly fix the edges of walkways and driveways and fix any cracks.  Remove anything that might make you trip as you walk through a door, such as a raised step or threshold.  Trim any bushes or trees on the path to your home.  Use bright outdoor lighting.  Clear any walking paths of anything that might make someone trip, such as rocks or tools.  Regularly check to see if handrails are loose or broken. Make sure that both sides of any steps have handrails.  Any raised decks and porches should have guardrails on the edges.  Have any leaves, snow, or ice cleared regularly.  Use sand or salt on walking paths during winter.  Clean up any spills in your garage right away. This includes oil or grease spills. What can I do in the bathroom?  Use night lights.  Install grab bars by the toilet and in the tub and shower. Do not use towel bars as grab bars.  Use non-skid mats or decals in the tub or shower.  If you need to sit down in the shower, use a plastic, non-slip stool.  Keep the floor dry. Clean up any water that spills on the floor as soon as it happens.  Remove soap buildup in the tub or shower regularly.  Attach bath mats securely with double-sided non-slip rug tape.  Do not have throw rugs and other things on the floor that can make  you trip. What can I do in the bedroom?  Use night lights.  Make sure that you have a light by your bed that is easy to reach.  Do not use any sheets or blankets that are too big for your bed. They should not hang down onto the floor.  Have a firm chair that has side arms. You can use this for support while you get dressed.  Do not have throw rugs and other things on the floor that can make you trip. What can I do in the kitchen?  Clean up any spills right away.  Avoid walking on wet floors.  Keep items that you use a lot in easy-to-reach places.  If you need to reach something above you, use a strong step stool that has a grab bar.  Keep electrical cords out of the way.  Do not use floor polish or wax that makes floors slippery. If you must use wax, use non-skid floor wax.  Do not have throw rugs and other things on the floor that can make you trip. What can I do with my stairs?  Do not leave any items on the stairs.  Make sure that there are handrails on both sides of the stairs  and use them. Fix handrails that are broken or loose. Make sure that handrails are as long as the stairways.  Check any carpeting to make sure that it is firmly attached to the stairs. Fix any carpet that is loose or worn.  Avoid having throw rugs at the top or bottom of the stairs. If you do have throw rugs, attach them to the floor with carpet tape.  Make sure that you have a light switch at the top of the stairs and the bottom of the stairs. If you do not have them, ask someone to add them for you. What else can I do to help prevent falls?  Wear shoes that:  Do not have high heels.  Have rubber bottoms.  Are comfortable and fit you well.  Are closed at the toe. Do not wear sandals.  If you use a stepladder:  Make sure that it is fully opened. Do not climb a closed stepladder.  Make sure that both sides of the stepladder are locked into place.  Ask someone to hold it for you, if  possible.  Clearly mark and make sure that you can see:  Any grab bars or handrails.  First and last steps.  Where the edge of each step is.  Use tools that help you move around (mobility aids) if they are needed. These include:  Canes.  Walkers.  Scooters.  Crutches.  Turn on the lights when you go into a dark area. Replace any light bulbs as soon as they burn out.  Set up your furniture so you have a clear path. Avoid moving your furniture around.  If any of your floors are uneven, fix them.  If there are any pets around you, be aware of where they are.  Review your medicines with your doctor. Some medicines can make you feel dizzy. This can increase your chance of falling. Ask your doctor what other things that you can do to help prevent falls. This information is not intended to replace advice given to you by your health care provider. Make sure you discuss any questions you have with your health care provider. Document Released: 07/02/2009 Document Revised: 02/11/2016 Document Reviewed: 10/10/2014 Elsevier Interactive Patient Education  2017 [employer].

## 2016-12-13 NOTE — Patient Instructions (Signed)
Try Turmeric over the counter that could hopefully help finger pain you get.

## 2016-12-13 NOTE — Progress Notes (Signed)
Claire Kane  MRN: [id number] DOB: [birth year]  Subjective:  HPI   Patient is here for 4 months follow up. LOV was 08/30/16. Last labs:  07/25/16 Iron, IBC, Ferritin, CBC.   01/18/16 B12 and Folate   11/04/15 TSH, MetC, CBC   04/12/14 lipids Patient takes Omeprazole in the morning and Ranitidine in the evening. She is still having issues with burping, some rumbling sounds in her stomach and occasional acid reflux. This was still an issue in December 2017. Symptoms are improving some compared to before. Has had issues with pain in her fingers and wanted to discuss this issue.  Patient Active Problem List   Diagnosis Date Noted  . Iron deficiency anemia 04/28/2016  . Shortness of breath 11/13/2015  . Carotid artery narrowing 11/13/2015  . Cataract 11/04/2015  . Clinical depression 11/04/2015  . Dyslipidemia 11/04/2015  . 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 Acid reflux 11/04/2015  . Adiposity 11/04/2015  . 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 Awareness of heartbeats 11/04/2015  . Depression 08/19/2015  . Fibrocystic breast disease 08/19/2015  . Carotid stenosis 08/19/2015  . Osteoarthritis 08/19/2015  . Obesity 08/19/2015  . Hyperlipidemia 08/19/2015  . 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 Heart murmur, systolic 67/89/3810  . 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 Other specified symptoms and signs involving the circulatory and respiratory systems 06/17/2011  . Acid indigestion 06/17/2011  . Gout 06/17/2011  . Diarrhea, unspecified 06/17/2011    Past Medical History:  Diagnosis Date  . Anemia   . Arthritis   . BRCA negative    PER PT  . Breast cancer ([place]) 2002   DCIS right breast   . Heart murmur 2001  . High cholesterol   . Hypertension   . Personal history of radiation therapy 2002   BREAST CA  .  Rosacea   . 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 complications     Social History   Social History  . Marital status: Married    Spouse name: N/A  . Number of children: N/A  . Years of education: N/A   Occupational History  . Not on file.   Social History Main Topics  . Smoking status: Never Smoker  . Smokeless tobacco: Never Used  . Alcohol use No  . Drug use: No  . Sexual activity: No   Other Topics Concern  . Not on file   Social History Narrative  . No narrative on file    Outpatient Encounter Prescriptions as of 12/13/2016  Medication Sig Note  . aspirin EC 81 MG tablet Take 81 mg by mouth daily.   Marland Kitchen atorvastatin (LIPITOR) 10 MG tablet Take 10 mg by mouth daily.   . calcium citrate (CALCITRATE - DOSED IN MG ELEMENTAL CALCIUM) 950 MG tablet Take 200 mg of elemental calcium by mouth 2 (two) times daily.    Marland Kitchen ezetimibe (ZETIA) 10 MG tablet Take 10 mg by mouth daily.   Marland Kitchen glucosamine-chondroitin 500-400 MG tablet Take 1 tablet by mouth 3 (three) times daily.   . Multiple Vitamins-Minerals (MULTIVITAMIN WITH MINERALS) tablet Take 1 tablet by mouth daily.   . Omega-3 Fatty Acids (FISH OIL BURP-LESS) 1000 MG CAPS Take by mouth. 05/28/2015: Received from: [facility]  . omeprazole (PRILOSEC) 20 MG capsule take 1 capsule by mouth once daily   . ORACEA 40 MG capsule  PRN 05/28/2015: Received from: External Pharmacy  . psyllium (REGULOID) 0.52 G capsule Take 0.52 g by mouth daily.   . ranitidine (ZANTAC) 150 MG tablet take 1 tablet by mouth twice a day (Patient taking differently: take 1 tablet by mouth once a day)   . SOOLANTRA 1 % CREA  07/25/2016: Received from: External Pharmacy  . spironolactone (ALDACTONE) 50 MG tablet 50 mg 2 (two) times daily.  05/28/2015: Received from: [facility]  . WELCHOL 625 MG tablet Take 2 tablets by mouth 2 (two) times daily with a meal.  07/25/2013: Received from: External Pharmacy Received Sig:   .  [DISCONTINUED] clotrimazole-betamethasone (LOTRISONE) cream Apply 1 application topically 2 (two) times daily. (Patient not taking: Reported on 11/24/2016)   . [DISCONTINUED] ferrous sulfate 325 (65 FE) MG tablet Take by mouth. 08/30/2016: Received from: [place]: Take 325 mg by mouth 2 (two) times daily.  . [DISCONTINUED] sucralfate (CARAFATE) 1 g tablet Take 1 tablet (1 g total) by mouth 4 (four) times daily -  with meals and at bedtime. (Patient not taking: Reported on 08/30/2016)   . [DISCONTINUED] VANIQA 13.9 % cream APPLY TWICE DAILY TO UNWANTED HAIR 07/25/2016: Received from: External Pharmacy   No facility-administered encounter medications on file as of 12/13/2016.     No Known Allergies  Review of Systems  Constitutional: Positive for malaise/fatigue.  Respiratory: Positive for shortness of breath (occasionally).   Cardiovascular: Negative.   Gastrointestinal:       Rumbling sensation in her abdomen, burping and occasional acid reflux.  Musculoskeletal: Positive for back pain and joint pain (fingers and some swelling).  Neurological: Negative.   Endo/Heme/Allergies: Negative.   Psychiatric/Behavioral: Negative.     Objective:  BP 124/72   Pulse 96   Temp 97.5 F (36.4 C)   Wt 201 lb (91.2 kg)   BMI 34.50 kg/m   Physical Exam  Constitutional: She is oriented to person, place, and time and well-developed, well-nourished, and in no distress.  HENT:  Head: Normocephalic and atraumatic.  Eyes: Conjunctivae are normal. Pupils are equal, round, and reactive to light.  Neck: Normal range of motion. Neck supple.  Cardiovascular: Normal rate, regular rhythm, normal heart sounds and intact distal pulses.   No murmur heard. Pulmonary/Chest: Effort normal and breath sounds normal. No respiratory distress. She has no wheezes.  Musculoskeletal:  heberden node-right middle finger  Neurological: She is alert and oriented to person, place, and time.    Assessment and Plan :  1. Gastroesophageal reflux disease without esophagitis Stable for now. Symptoms improving. Discussed this in details. Follow for now. May need to refer patient to Dr. Vicente Males.  2. Iron deficiency anemia, unspecified iron deficiency anemia type Re check labs today. - CBC w/Diff/Platelet - Iron  3. Other hyperlipidemia Follows Dr Arther Dames, lipid level updated in the chart from their office.Good control. Total Chol 129  LDL 69 4. Other fatigue - Renal Function Panel - TSH  5. OA (osteoarthritis) of finger, right Advised to take Turmeric and continue Glucosamine. Follow as needed.  HPI, Exam and A&P transcribed under direction and in the presence of Miguel Aschoff, MD. I have done the exam and reviewed the chart and it is accurate to the best of my knowledge. Development worker, community has been used and  any errors in dictation or transcription are unintentional. Miguel Aschoff M.D. [facility]

## 2016-12-14 LAB — CBC WITH DIFFERENTIAL/PLATELET
BASOS: 0 %
Basophils Absolute: 0 10*3/uL (ref 0.0–0.2)
EOS (ABSOLUTE): 0.2 10*3/uL (ref 0.0–0.4)
EOS: 2 %
HEMATOCRIT: 35.3 % (ref 34.0–46.6)
Hemoglobin: 11.6 g/dL (ref 11.1–15.9)
Immature Grans (Abs): 0 10*3/uL (ref 0.0–0.1)
Immature Granulocytes: 0 %
LYMPHS ABS: 3.3 10*3/uL — AB (ref 0.7–3.1)
Lymphs: 30 %
MCH: 27.1 pg (ref 26.6–33.0)
MCHC: 32.9 g/dL (ref 31.5–35.7)
MCV: 83 fL (ref 79–97)
MONOS ABS: 0.9 10*3/uL (ref 0.1–0.9)
Monocytes: 9 %
NEUTROS PCT: 59 %
Neutrophils Absolute: 6.5 10*3/uL (ref 1.4–7.0)
PLATELETS: 396 10*3/uL — AB (ref 150–379)
RBC: 4.28 x10E6/uL (ref 3.77–5.28)
RDW: 15.1 % (ref 12.3–15.4)
WBC: 11 10*3/uL — ABNORMAL HIGH (ref 3.4–10.8)

## 2016-12-14 LAB — TSH: TSH: 1.8 u[IU]/mL (ref 0.450–4.500)

## 2016-12-14 LAB — RENAL FUNCTION PANEL
ALBUMIN: 4.4 g/dL (ref 3.5–4.8)
BUN / CREAT RATIO: 24 (ref 12–28)
BUN: 19 mg/dL (ref 8–27)
CHLORIDE: 101 mmol/L (ref 96–106)
CO2: 26 mmol/L (ref 18–29)
CREATININE: 0.78 mg/dL (ref 0.57–1.00)
Calcium: 9.4 mg/dL (ref 8.7–10.3)
GFR calc Af Amer: 86 mL/min/{1.73_m2} (ref >59)
GFR, EST NON AFRICAN AMERICAN: 75 mL/min/{1.73_m2} (ref >59)
Glucose: 98 mg/dL (ref 65–99)
Phosphorus: 3.9 mg/dL (ref 2.5–4.5)
Potassium: 4.5 mmol/L (ref 3.5–5.2)
Sodium: 142 mmol/L (ref 134–144)

## 2016-12-14 LAB — IRON: Iron: 40 ug/dL (ref 27–139)

## 2017-01-09 ENCOUNTER — Other Ambulatory Visit: Payer: Self-pay | Admitting: Family Medicine

## 2017-01-09 DIAGNOSIS — K30 Functional dyspepsia: Secondary | ICD-10-CM

## 2017-01-09 MED ORDER — RANITIDINE HCL 150 MG PO TABS: 150.0000 mg | ORAL_TABLET | Freq: Every day | ORAL | 3 refills | Status: DC

## 2017-01-09 MED ORDER — OMEPRAZOLE 20 MG PO CPDR: 20.0000 mg | DELAYED_RELEASE_CAPSULE | Freq: Every day | ORAL | 3 refills | Status: DC

## 2017-01-09 NOTE — Telephone Encounter (Signed)
Done-aa 

## 2017-01-09 NOTE — Telephone Encounter (Signed)
[facility] faxed a request for the following medications. Thanks CC  omeprazole (PRILOSEC) 20 MG capsule   ranitidine (ZANTAC) 150 MG tablet

## 2017-03-16 ENCOUNTER — Ambulatory Visit (INDEPENDENT_AMBULATORY_CARE_PROVIDER_SITE_OTHER): Payer: Medicare Other | Admitting: Family Medicine

## 2017-03-16 VITALS — BP 110/62 | HR 84 | Resp 14 | Wt 203.0 lb

## 2017-03-16 DIAGNOSIS — R142 Eructation: Secondary | ICD-10-CM | POA: Diagnosis not present

## 2017-03-16 DIAGNOSIS — K219 Gastro-esophageal reflux disease without esophagitis: Secondary | ICD-10-CM

## 2017-03-16 DIAGNOSIS — Z0000 Encounter for general adult medical examination without abnormal findings: Secondary | ICD-10-CM | POA: Diagnosis not present

## 2017-03-16 NOTE — Progress Notes (Signed)
Patient: Claire Kane, Female    DOB: 17-Jan-1941, 76 y.o.   MRN: [id number] Visit Date: 03/16/2017  Today's Provider: Wilhemena Durie, MD   Chief Complaint  Patient presents with  . Annual Exam   Subjective:  JEZABELLA SCHRIEVER is a 76 y.o. female who presents today for health maintenance and complete physical. She feels fairly well-having trouble with back pain and back spasms. She reports exercising not at this time. She reports she is sleeping well. Patient saw McKenzie for wellness visit in march 2018.  She is married for 50 years. 2 children and 3 grandchildren. Fall Risk  12/13/2016 03/16/2016 05/28/2015  Falls in the past year? Yes No Yes  Number falls in past yr: 1 - 1  Injury with Fall? Yes - No  Follow up Falls prevention discussed - -   6CIT Screen 12/13/2016  What Year? 0 points  What month? 0 points  What time? 0 points  Count back from 20 0 points  Months in reverse 0 points  Repeat phrase 0 points  Total Score 0   Immunization History  Administered Date(s) Administered  . Influenza, High Dose Seasonal PF 05/28/2015  . Pneumococcal Conjugate-13 04/22/2014  . Pneumococcal Polysaccharide-23 03/15/2011  . Td 09/06/2007  . Tdap 04/22/2014  . Zoster 06/27/2011   Last mammogram 08/19/16 BMD 04/05/12 normal Colonoscopy and endoscopy 12/08/15 hemorrhoids and diverticulosis, otherwise normal.  Review of Systems  Constitutional: Positive for fatigue.  HENT: Negative.   Eyes: Negative.   Respiratory: Positive for shortness of breath (with exertion).   Cardiovascular: Negative.   Gastrointestinal: Positive for constipation (sometimes).  Endocrine: Negative.   Genitourinary: Positive for enuresis.  Musculoskeletal: Positive for arthralgias, back pain, joint swelling (finger), neck pain and neck stiffness.  Skin: Negative.   Allergic/Immunologic: Negative.   Neurological: Negative.   Hematological: Negative.   Psychiatric/Behavioral: Negative.     Social History    Social History  . Marital status: Married    Spouse name: N/A  . Number of children: N/A  . Years of education: N/A   Occupational History  . Not on file.   Social History Main Topics  . Smoking status: Never Smoker  . Smokeless tobacco: Never Used  . Alcohol use No  . Drug use: No  . Sexual activity: No   Other Topics Concern  . Not on file   Social History Narrative  . No narrative on file    Patient Active Problem List   Diagnosis Date Noted  . Iron deficiency anemia 04/28/2016  . Shortness of breath 11/13/2015  . Carotid artery narrowing 11/13/2015  . Cataract 11/04/2015  . Clinical depression 11/04/2015  . Dyslipidemia 11/04/2015  . 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 Acid reflux 11/04/2015  . Adiposity 11/04/2015  . 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 Awareness of heartbeats 11/04/2015  . Depression 08/19/2015  . Fibrocystic breast disease 08/19/2015  . Carotid stenosis 08/19/2015  . Osteoarthritis 08/19/2015  . Obesity 08/19/2015  . Hyperlipidemia 08/19/2015  . 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 Heart murmur, systolic 40/34/7425  . 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 Other specified symptoms and signs involving the circulatory and respiratory systems 06/17/2011  . Acid indigestion 06/17/2011  . Gout 06/17/2011  . Diarrhea, unspecified 06/17/2011    Past Surgical History:  Procedure Laterality Date  . ABDOMINAL HYSTERECTOMY    .  BREAST CYST ASPIRATION Left 2003  . BREAST EXCISIONAL BIOPSY Right 2002   POS  . BREAST SURGERY Right 2002   lumpectomy  . CHOLECYSTECTOMY    . COLONOSCOPY  2013  . COLONOSCOPY WITH PROPOFOL N/A 12/08/2015   Procedure: COLONOSCOPY WITH PROPOFOL;  Surgeon: Christene Lye, MD;  Location: [facility] ENDOSCOPY;  Service: Endoscopy;  Laterality: N/A;  .  ESOPHAGOGASTRODUODENOSCOPY (EGD) WITH PROPOFOL N/A 12/08/2015   Procedure: ESOPHAGOGASTRODUODENOSCOPY (EGD) WITH PROPOFOL;  Surgeon: Christene Lye, MD;  Location: [facility] ENDOSCOPY;  Service: Endoscopy;  Laterality: N/A;  . EYE SURGERY Right 2014  . post radiation therapy Right    right breast cancer 2002  . Stab pheblectomy   2010  . vein closure procedure Bilateral 2008    Her family history includes Breast cancer (age of onset: 20) in her other; Breast cancer (age of onset: 37) in her cousin; Breast cancer (age of onset: 34) in her maternal aunt; Cancer (age of onset: 33) in her cousin; Heart attack in her brother, father, mother, and sister; Other in her brother; Stroke in her sister.     Outpatient Encounter Prescriptions as of 03/16/2017  Medication Sig Note  . aspirin EC 81 MG tablet Take 81 mg by mouth daily.   Marland Kitchen atorvastatin (LIPITOR) 10 MG tablet Take 10 mg by mouth daily.   . calcium citrate (CALCITRATE - DOSED IN MG ELEMENTAL CALCIUM) 950 MG tablet Take 200 mg of elemental calcium by mouth 2 (two) times daily.    Marland Kitchen docusate sodium (STOOL SOFTENER) 100 MG capsule Take 100 mg by mouth daily as needed for mild constipation.   Marland Kitchen ezetimibe (ZETIA) 10 MG tablet Take 10 mg by mouth daily.   . Glucosamine 750 MG TABS Take by mouth daily.   . Multiple Vitamins-Minerals (MULTIVITAMIN WITH MINERALS) tablet Take 1 tablet by mouth daily.   . Omega-3 Fatty Acids (FISH OIL BURP-LESS) 1000 MG CAPS Take by mouth. 05/28/2015: Received from: [facility]  . omeprazole (PRILOSEC) 20 MG capsule Take 1 capsule (20 mg total) by mouth daily.   Darla Lesches 40 MG capsule PRN 05/28/2015: Received from: External Pharmacy  . Polyethylene Glycol 3350 (MIRALAX PO) Take by mouth daily as needed.   . psyllium (REGULOID) 0.52 G capsule Take 0.52 g by mouth daily.   . ranitidine (ZANTAC) 150 MG tablet Take 1 tablet (150 mg total) by mouth daily.   . SOOLANTRA 1 % CREA  07/25/2016: Received from:  External Pharmacy  . spironolactone (ALDACTONE) 50 MG tablet 50 mg 2 (two) times daily.  05/28/2015: Received from: [facility]  . TURMERIC PO Take by mouth daily.   [name] 625 MG tablet Take 2 tablets by mouth 2 (two) times daily with a meal.  07/25/2013: Received from: External Pharmacy Received Sig:   . [DISCONTINUED] glucosamine-chondroitin 500-400 MG tablet Take 1 tablet by mouth 3 (three) times daily.    No facility-administered encounter medications on file as of 03/16/2017.     Patient Care Team: Jerrol Banana., MD as PCP - General (Family Medicine) Christene Lye, MD (General Surgery) Dingeldein, Remo Lipps, MD as Consulting Physician (Ophthalmology) Gae Dry, MD as Referring Physician (Obstetrics and Gynecology)      Objective:   Vitals:  Vitals:   03/16/17 1017  BP: 110/62  Pulse: 84  Resp: 14  Weight: 203 lb (92.1 kg)    Physical Exam  Constitutional: She is oriented to person, place, and time. She appears well-developed  and well-nourished.  HENT:  Head: Normocephalic and atraumatic.  Right Ear: External ear normal.  Left Ear: External ear normal.  Nose: Nose normal.  Mouth/Throat: Oropharynx is clear and moist.  Eyes: Conjunctivae and EOM are normal. Pupils are equal, round, and reactive to light.  Neck: Normal range of motion. Neck supple.  Cardiovascular: Normal rate, regular rhythm, normal heart sounds and intact distal pulses.   Pulmonary/Chest: Effort normal and breath sounds normal.  Abdominal: Bowel sounds are normal.  Genitourinary:  Genitourinary Comments: Left nipple inverted.   Musculoskeletal: Normal range of motion.  Neurological: She is alert and oriented to person, place, and time. She has normal reflexes.  Skin: Skin is warm and dry.  Psychiatric: She has a normal mood and affect. Her behavior is normal. Judgment and thought content normal.     Depression Screen PHQ 2/9 Scores 12/13/2016 12/13/2016  03/16/2016 05/28/2015  PHQ - 2 Score 0 0 0 0  PHQ- 9 Score 0 - - -      Assessment & Plan:     Routine Health Maintenance and Physical Exam  Exercise Activities and Dietary recommendations Goals    . Exercise           Recommend increasing exercise by walking 4 days a week for 25 minutes.       Chronic GERD symptoms Refer to GI.

## 2017-03-29 ENCOUNTER — Ambulatory Visit: Payer: Medicare Other | Admitting: Obstetrics & Gynecology

## 2017-04-05 ENCOUNTER — Encounter: Payer: Self-pay | Admitting: Obstetrics & Gynecology

## 2017-04-05 ENCOUNTER — Ambulatory Visit (INDEPENDENT_AMBULATORY_CARE_PROVIDER_SITE_OTHER): Payer: Medicare Other | Admitting: Obstetrics & Gynecology

## 2017-04-05 VITALS — BP 130/70 | HR 90 | Ht 64.0 in | Wt 197.0 lb

## 2017-04-05 DIAGNOSIS — N8111 Cystocele, midline: Secondary | ICD-10-CM | POA: Diagnosis not present

## 2017-04-05 DIAGNOSIS — N8110 Cystocele, unspecified: Secondary | ICD-10-CM | POA: Diagnosis not present

## 2017-04-05 NOTE — Progress Notes (Signed)
  HPI:      [name] is a 76 y.o. G3P2 who presents today for her pessary follow up and examination related to her pelvic floor weakening.  Pt reports tolerating the pessary well with  no vaginal bleeding and rare vaginal discharge. Occas cleans and replaces her Cup Pessary herself. Symptoms of pelvic floor weakening have greatly improved. She is voiding and defecating without difficulty. She currently has a #4Cup pessary.  PMHx: She  has a past medical history of Anemia; Arthritis; BRCA negative; Breast cancer ([place]) (2002); Heart murmur (2001); High cholesterol; Hypertension; Personal history of radiation therapy (2002); Rosacea; and 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 complications. Also,  has a past surgical history that includes Stab pheblectomy  (2010); vein closure procedure (Bilateral, 2008); Abdominal hysterectomy; Cholecystectomy; Breast surgery (Right, 2002); Eye surgery (Right, 2014); Colonoscopy (2013); post radiation therapy (Right); Colonoscopy with propofol (N/A, 12/08/2015); Esophagogastroduodenoscopy (egd) with propofol (N/A, 12/08/2015); Breast excisional biopsy (Right, 2002); and Breast cyst aspiration (Left, 2003)., family history includes Breast cancer (age of onset: 22) in her other; Breast cancer (age of onset: 70) in her cousin; Breast cancer (age of onset: 59) in her maternal aunt; Cancer (age of onset: 87) in her cousin; Heart attack in her brother, father, mother, and sister; Other in her brother; Stroke in her sister.,  reports that she has never smoked. She has never used smokeless tobacco. She reports that she does not drink alcohol or use drugs.  She has a current medication list which includes the following prescription(s): aspirin ec, atorvastatin, calcium citrate, docusate sodium, ezetimibe, glucosamine, multivitamin with minerals, fish oil burp-less, omeprazole, oracea, polyethylene glycol 3350, psyllium, ranitidine, soolantra, spironolactone, turmeric, and  welchol. Also, has No Known Allergies.  Review of Systems  All other systems reviewed and are negative.   Objective: BP 130/70   Pulse 90   Ht '5\' 4"'$  (1.626 m)   Wt 197 lb (89.4 kg)   BMI 33.81 kg/m  Physical Exam  Constitutional: She is oriented to person, place, and time. She appears well-developed and well-nourished. No distress.  Genitourinary: Vagina normal. Pelvic exam was performed with patient supine. There is no rash, tenderness or lesion on the right labia. There is no rash, tenderness or lesion on the left labia. No erythema or bleeding in the vagina.  Genitourinary Comments: Cuff intact/ no lesions  Absent uterus and cervix  Abdominal: Soft. She exhibits no distension. There is no tenderness.  Musculoskeletal: Normal range of motion.  Neurological: She is alert and oriented to person, place, and time. No cranial nerve deficit.  Skin: Skin is warm and dry.  Psychiatric: She has a normal mood and affect.    Pessary Care Pessary removed and cleaned.  Vagina checked - without erosions - pessary replaced.  A/P:  Pessary was cleaned and replaced today. Instructions given for care. Concerning symptoms to observe for are counseled to patient. Follow up scheduled for 3 months.  Barnett Applebaum, MD, [facility], [facility] 04/05/2017  2:47 PM

## 2017-04-10 ENCOUNTER — Telehealth: Payer: Self-pay | Admitting: Family Medicine

## 2017-04-10 MED ORDER — TRAMADOL HCL 50 MG PO TABS: 50.0000 mg | ORAL_TABLET | Freq: Four times a day (QID) | ORAL | 5 refills | Status: DC | PRN

## 2017-04-10 NOTE — Telephone Encounter (Signed)
Pt states at her last visit Dr Rosanna Randy had the nurse put a note on her chart that if she needed a Rx for Tramadol for back pain  she could call to request it.  [place].  [phone]

## 2017-04-10 NOTE — Telephone Encounter (Signed)
I do not see Tramadol in her chart. Do you remember discussing this with patient?-aa

## 2017-04-10 NOTE — Telephone Encounter (Signed)
1 q 6 hrs prn pain,#100,5rf.

## 2017-04-10 NOTE — Telephone Encounter (Signed)
RX for Tramadol sent to [facility]	[place]. Patient advised.

## 2017-05-23 ENCOUNTER — Ambulatory Visit: Payer: Medicare Other | Admitting: Gastroenterology

## 2017-07-11 ENCOUNTER — Other Ambulatory Visit: Payer: Self-pay | Admitting: General Surgery

## 2017-07-11 DIAGNOSIS — Z1231 Encounter for screening mammogram for malignant neoplasm of breast: Secondary | ICD-10-CM

## 2017-07-12 ENCOUNTER — Other Ambulatory Visit: Payer: Self-pay

## 2017-07-12 DIAGNOSIS — Z1231 Encounter for screening mammogram for malignant neoplasm of breast: Secondary | ICD-10-CM

## 2017-07-19 ENCOUNTER — Other Ambulatory Visit: Payer: Self-pay | Admitting: Family Medicine

## 2017-07-19 NOTE — Telephone Encounter (Signed)
[facility] faxed a request for the following medication. Thanks CC  traMADol (ULTRAM) 50 MG tablet

## 2017-07-20 MED ORDER — TRAMADOL HCL 50 MG PO TABS: 50.0000 mg | ORAL_TABLET | Freq: Four times a day (QID) | ORAL | 5 refills | Status: DC | PRN

## 2017-07-21 ENCOUNTER — Other Ambulatory Visit: Payer: Self-pay | Admitting: Family Medicine

## 2017-07-21 MED ORDER — TRAMADOL HCL 50 MG PO TABS: 50.0000 mg | ORAL_TABLET | Freq: Four times a day (QID) | ORAL | 5 refills | Status: DC | PRN

## 2017-07-21 NOTE — Telephone Encounter (Signed)
[facility] faxed a request for the following medication. Thanks CC  traMADol (ULTRAM) 50 MG tablet

## 2017-07-21 NOTE — Telephone Encounter (Signed)
[place] reviewed. Rx printed to be faxed to [facility]

## 2017-08-01 ENCOUNTER — Ambulatory Visit: Payer: Medicare Other | Admitting: Family Medicine

## 2017-08-04 ENCOUNTER — Ambulatory Visit: Payer: Medicare Other | Admitting: Obstetrics & Gynecology

## 2017-08-04 ENCOUNTER — Encounter: Payer: Self-pay | Admitting: Obstetrics & Gynecology

## 2017-08-04 ENCOUNTER — Ambulatory Visit
Admission: RE | Admit: 2017-08-04 | Discharge: 2017-08-04 | Disposition: A | Discharge status: Home / Self Care | Payer: Medicare Other | Source: Ambulatory Visit | Attending: General Surgery | Admitting: General Surgery

## 2017-08-04 VITALS — BP 140/80 | HR 89 | Ht 64.0 in | Wt 190.0 lb

## 2017-08-04 DIAGNOSIS — Z1231 Encounter for screening mammogram for malignant neoplasm of breast: Secondary | ICD-10-CM | POA: Diagnosis present

## 2017-08-04 DIAGNOSIS — N8110 Cystocele, unspecified: Secondary | ICD-10-CM

## 2017-08-04 DIAGNOSIS — N8111 Cystocele, midline: Secondary | ICD-10-CM

## 2017-08-04 NOTE — Progress Notes (Signed)
  HPI:      [name] is a 76 y.o. G3P2 who presents today for her pessary follow up and examination related to her pelvic floor weakening.  Pt reports tolerating the pessary well with  no vaginal bleeding and  no vaginal discharge.  Symptoms of pelvic floor weakening have greatly improved. She is voiding and defecating without difficulty. She currently has a cup #4 pessary.  Occas cleans/removes/ replaces herself  PMHx: She  has a past medical history of Anemia, Arthritis, BRCA negative, Breast cancer ([place]) (2002), Heart murmur (2001), High cholesterol, Hypertension, Personal history of radiation therapy (2002), Rosacea, and 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 complications. Also,  has a past surgical history that includes Stab pheblectomy  (2010); vein closure procedure (Bilateral, 2008); Abdominal hysterectomy; Cholecystectomy; Breast surgery (Right, 2002); Eye surgery (Right, 2014); Colonoscopy (2013); post radiation therapy (Right); Colonoscopy with propofol (N/A, 12/08/2015); Esophagogastroduodenoscopy (egd) with propofol (N/A, 12/08/2015); Breast excisional biopsy (Right, 2002); and Breast cyst aspiration (Left, 2003)., family history includes Breast cancer (age of onset: 27) in her other; Breast cancer (age of onset: 41) in her cousin; Breast cancer (age of onset: 58) in her maternal aunt; Cancer (age of onset: 75) in her cousin; Heart attack in her brother, father, mother, and sister; Other in her brother; Stroke in her sister.,  reports that  has never smoked. she has never used smokeless tobacco. She reports that she does not drink alcohol or use drugs.  She has a current medication list which includes the following prescription(s): aspirin ec, atorvastatin, calcium citrate, docusate sodium, ezetimibe, glucosamine, multivitamin with minerals, fish oil burp-less, omeprazole, oracea, polyethylene glycol 3350, psyllium, ranitidine, soolantra, spironolactone, tramadol, turmeric, and  welchol. Also, has No Known Allergies.  Review of Systems  All other systems reviewed and are negative.   Objective: BP 140/80   Pulse 89   Ht '5\' 4"'$  (1.626 m)   Wt 190 lb (86.2 kg)   BMI 32.61 kg/m  Physical Exam  Constitutional: She is oriented to person, place, and time. She appears well-developed and well-nourished. No distress.  Genitourinary: Vagina normal. Pelvic exam was performed with patient supine. There is no rash, tenderness or lesion on the right labia. There is no rash, tenderness or lesion on the left labia. No erythema or bleeding in the vagina.  Genitourinary Comments: Cuff intact/ no lesions  Absent uterus and cervix  Abdominal: Soft. She exhibits no distension. There is no tenderness.  Musculoskeletal: Normal range of motion.  Neurological: She is alert and oriented to person, place, and time. No cranial nerve deficit.  Skin: Skin is warm and dry.  Psychiatric: She has a normal mood and affect.    Pessary Care Pessary removed and cleaned.  Vagina checked - without erosions - pessary replaced.  A/P:Cystocele and vag prolapse, under care w pessary Pessary was cleaned and replaced today. Instructions given for care. Concerning symptoms to observe for are counseled to patient. Follow up scheduled for 3 months.  A total of 15 minutes were spent face-to-face with the patient during this encounter and over half of that time dealt with counseling and coordination of care.  Barnett Applebaum, MD, [facility], [facility] 08/04/2017  10:46 AM

## 2017-08-07 ENCOUNTER — Telehealth: Payer: Self-pay | Admitting: Family Medicine

## 2017-08-07 NOTE — Telephone Encounter (Signed)
please review-Claire Kane, RMA

## 2017-08-07 NOTE — Telephone Encounter (Signed)
Pt states she is out of town and left her Rx for traMADol (ULTRAM) 50 MG tablet at home.  Pt is asking if she can get enough medication for a week due to having back pain.  [facility] [place], [place] Phone [phone].  CB#[phone]/MW

## 2017-08-08 NOTE — Telephone Encounter (Signed)
Pt called to get an update about having traMADol (ULTRAM) 50 MG tablet sent for a weeks supply to last her while she is out of town. Please advise. Thanks TNP

## 2017-08-08 NOTE — Telephone Encounter (Signed)
RX called in for Tramadol just for 1 week since she is out of town, pt advised on her [place], RMA

## 2017-08-08 NOTE — Telephone Encounter (Signed)
Ok to rf with 4 rf.

## 2017-08-15 ENCOUNTER — Ambulatory Visit: Payer: Medicare Other | Admitting: General Surgery

## 2017-08-15 ENCOUNTER — Encounter: Payer: Self-pay | Admitting: General Surgery

## 2017-08-15 VITALS — BP 140/82 | Resp 14 | Ht 64.0 in | Wt 187.0 lb

## 2017-08-15 DIAGNOSIS — Z86000 Personal history of in-situ neoplasm of breast: Secondary | ICD-10-CM | POA: Diagnosis not present

## 2017-08-15 NOTE — Progress Notes (Signed)
Patient ID: Claire Kane, female   DOB: 1940/12/11, 76 y.o.   MRN: [id number]  Chief Complaint  Patient presents with  . Follow-up    HPI Claire Kane is a 76 y.o. female with history of right breast DCIS who presents for a breast evaluation. The most recent mammogram was done on 08/04/2017.  Patient does perform regular self breast checks and gets regular mammograms done.   Patient admits continued loud belching after solids and liquids, had EGD in 2017. Marland KitchenHPI  Past Medical History:  Diagnosis Date  . Anemia   . Arthritis   . BRCA negative    PER PT  . Breast cancer ([place]) 2002   DCIS right breast   . Heart murmur 2001  . High cholesterol   . Hypertension   . Personal history of radiation therapy 2002   BREAST CA  . Rosacea   . 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 complications     Past Surgical History:  Procedure Laterality Date  . ABDOMINAL HYSTERECTOMY    . BREAST CYST ASPIRATION Left 2003  . BREAST EXCISIONAL BIOPSY Right 2002   POS  . BREAST SURGERY Right 2002   lumpectomy  . CHOLECYSTECTOMY    . COLONOSCOPY  2013  . COLONOSCOPY WITH PROPOFOL N/A 12/08/2015   Procedure: COLONOSCOPY WITH PROPOFOL;  Surgeon: Christene Lye, MD;  Location: [facility] ENDOSCOPY;  Service: Endoscopy;  Laterality: N/A;  . ESOPHAGOGASTRODUODENOSCOPY (EGD) WITH PROPOFOL N/A 12/08/2015   Procedure: ESOPHAGOGASTRODUODENOSCOPY (EGD) WITH PROPOFOL;  Surgeon: Christene Lye, MD;  Location: [facility] ENDOSCOPY;  Service: Endoscopy;  Laterality: N/A;  . EYE SURGERY Right 2014  . post radiation therapy Right    right breast cancer 2002  . Stab pheblectomy   2010  . vein closure procedure Bilateral 2008    Family History  Problem Relation Age of Onset  . Heart attack Mother   . Heart attack Father   . Heart attack Sister   . Stroke Sister   . Heart attack Brother   . Other Brother        cerebral hemorrhage, sepsis  . Breast cancer Cousin 47       breast/maternal  . Cancer  Cousin 62       breast/maternal  . Breast cancer Maternal Aunt 70  . Breast cancer Other 48       maternal neice    Social History Social History   Tobacco Use  . Smoking status: Never Smoker  . Smokeless tobacco: Never Used  Substance Use Topics  . Alcohol use: No  . Drug use: No    No Known Allergies  Current Outpatient Medications  Medication Sig Dispense Refill  . aspirin EC 81 MG tablet Take 81 mg by mouth daily.    Marland Kitchen atorvastatin (LIPITOR) 10 MG tablet Take 10 mg by mouth daily.    . calcium citrate (CALCITRATE - DOSED IN MG ELEMENTAL CALCIUM) 950 MG tablet Take 200 mg of elemental calcium by mouth 2 (two) times daily.     Marland Kitchen docusate sodium (STOOL SOFTENER) 100 MG capsule Take 100 mg by mouth daily as needed for mild constipation.    Marland Kitchen ezetimibe (ZETIA) 10 MG tablet Take 10 mg by mouth daily.    . Glucosamine 750 MG TABS Take by mouth daily.    . Multiple Vitamins-Minerals (MULTIVITAMIN WITH MINERALS) tablet Take 1 tablet by mouth daily.    . Omega-3 Fatty Acids (FISH OIL BURP-LESS) 1000 MG CAPS Take by mouth.    Marland Kitchen  omeprazole (PRILOSEC) 20 MG capsule Take 1 capsule (20 mg total) by mouth daily. 90 capsule 3  . ORACEA 40 MG capsule PRN    . Polyethylene Glycol 3350 (MIRALAX PO) Take by mouth daily as needed.    . psyllium (REGULOID) 0.52 G capsule Take 0.52 g by mouth daily.    . ranitidine (ZANTAC) 150 MG tablet Take 1 tablet (150 mg total) by mouth daily. 90 tablet 3  . SOOLANTRA 1 % CREA     . spironolactone (ALDACTONE) 50 MG tablet 50 mg 2 (two) times daily.     . traMADol (ULTRAM) 50 MG tablet Take 1 tablet (50 mg total) by mouth every 6 (six) hours as needed. 100 tablet 5  . TURMERIC PO Take by mouth daily.    [name] 625 MG tablet Take 2 tablets by mouth 2 (two) times daily with a meal.      No current facility-administered medications for this visit.     Review of Systems Review of Systems  Constitutional: Negative.   Respiratory: Negative.    Cardiovascular: Negative.     Blood pressure 140/82, resp. rate 14, height _0  (1.626 m), weight 187 lb (84.8 kg).  Physical Exam Physical Exam  Constitutional: She is oriented to person, place, and time. She appears well-developed and well-nourished.  HENT:  Mouth/Throat: Oropharynx is clear and moist.  Eyes: Conjunctivae are normal. No scleral icterus.  Neck: Neck supple.  Cardiovascular: Normal rate, regular rhythm and normal heart sounds.  Pulmonary/Chest: Effort normal and breath sounds normal. No respiratory distress. Right breast exhibits no inverted nipple, no mass, no nipple discharge, no skin change and no tenderness. Left breast exhibits no inverted nipple, no mass, no nipple discharge, no skin change and no tenderness.    Abdominal: Soft. Normal appearance and bowel sounds are normal. There is no hepatomegaly. There is no tenderness.  Lymphadenopathy:    She has no cervical adenopathy.    She has no axillary adenopathy.  Neurological: She is alert and oriented to person, place, and time.  Skin: Skin is warm and dry.  Psychiatric: Her behavior is normal.    Data Reviewed Mammogram, previous notes, colonoscopy, endoscopy, surgical pahtology Mammogram revealed no evidence of malignancy.  Surgical pathology from EGD revealed minimal chronic gastritis negative for H. pylori, dysplasia and malignancy.  EGD 12/08/15 revealed normal duodenum, large hiatal hernia, and patchy erythematous gastric antrum mucosa that was biopsied.  Colonoscopy 12/07/25 showed multiple diverticula, external hemorrhoid, and no polyps.   Assessment    16 years post right breast lumpectomy and radiation for DCIS. Exam and mammogram stable. Last colonoscopy 12/08/15, no polyps. Upper endoscopy 12/08/15 showed a large sized hiatal hernia and mild focal non specific gastritis. Pathology was negative for H. pylori, dysplasia and malignancy.      Plan    Patient to follow up with Dr. Bary Castilla for  mammogram and breast checks. The patient is aware to call back for any questions or concerns.      HPI, Physical Exam, Assessment and Plan have been scribed under the direction and in the presence of Mckinley Jewel, MD  Gaspar Cola, CMA  I have completed the exam and reviewed the above documentation for accuracy and completeness.  I agree with the above.  Haematologist has been used and any errors in dictation or transcription are unintentional.  Seeplaputhur G. Jamal Collin, M.D., F.A.C.S.   Junie Panning G 08/15/2017, 2:54 PM

## 2017-08-15 NOTE — Patient Instructions (Addendum)
  Patient to follow up with Dr. Bary Castilla for mammogram and breast checks. The patient is aware to call back for any questions or concerns.

## 2017-10-18 ENCOUNTER — Telehealth: Payer: Self-pay

## 2017-10-18 NOTE — Telephone Encounter (Signed)
Patient had called the office today with concerns of "white patches" on her lip and soreness of her tongue intermittent for over 3 weeks. Patient states that she has had some discomfort when swallowing but denies sore/scratchy throat. Patient also reported that she has a history of anemia and complains of fatigue for the past several weeks. Patient denies fever, shortness of breath, difficulty breathing or URI symptoms. Patient states that she only wants to see Dr. Rosanna Randy, appt has been scheduled for tomorrow morning. KW

## 2017-10-19 ENCOUNTER — Encounter: Payer: Self-pay | Admitting: Family Medicine

## 2017-10-19 ENCOUNTER — Ambulatory Visit: Payer: Medicare Other | Admitting: Family Medicine

## 2017-10-19 VITALS — BP 122/64 | HR 78 | Temp 97.9°F | Resp 16 | Wt 187.0 lb

## 2017-10-19 DIAGNOSIS — M546 Pain in thoracic spine: Secondary | ICD-10-CM

## 2017-10-19 DIAGNOSIS — R5383 Other fatigue: Secondary | ICD-10-CM | POA: Diagnosis not present

## 2017-10-19 DIAGNOSIS — D509 Iron deficiency anemia, unspecified: Secondary | ICD-10-CM

## 2017-10-19 DIAGNOSIS — E7849 Other hyperlipidemia: Secondary | ICD-10-CM | POA: Diagnosis not present

## 2017-10-19 NOTE — Progress Notes (Signed)
Patient: Claire Kane Female    DOB: 1941/08/10   77 y.o.   MRN: [id number] Visit Date: 10/19/2017  Today's Provider: Wilhemena Durie, MD   Chief Complaint  Patient presents with  . Fatigue   Subjective:    HPI Pt is here today for fatigue, white patches on her lips, throat feels tighter than normal, she has some shortness of breath and her tongue is sore and she is constipated. She reports that she has been anemic in the past and feels like this may be the case again. Denies chest pain. She also reports that she gets a "low grade fever" at night sometimes about 99.0. Pt reports that she also has had constipation and has stopped all her medication for a couple days due to this to see if this helps. She has been taking Senakot to see if it helps. She has been taking Tramadol for the back pain.  She reports that her nails are weak and soft. She had stronger nails when she was taking the iron tablets and has been eating a lot of ice. She feels like her skin is pale.        No Known Allergies   Current Outpatient Medications:  .  atorvastatin (LIPITOR) 10 MG tablet, Take 10 mg by mouth daily., Disp: , Rfl:  .  calcium citrate (CALCITRATE - DOSED IN MG ELEMENTAL CALCIUM) 950 MG tablet, Take 200 mg of elemental calcium by mouth 2 (two) times daily. , Disp: , Rfl:  .  docusate sodium (STOOL SOFTENER) 100 MG capsule, Take 100 mg by mouth daily as needed for mild constipation., Disp: , Rfl:  .  ezetimibe (ZETIA) 10 MG tablet, Take 10 mg by mouth daily., Disp: , Rfl:  .  Glucosamine 750 MG TABS, Take by mouth daily., Disp: , Rfl:  .  Multiple Vitamins-Minerals (MULTIVITAMIN WITH MINERALS) tablet, Take 1 tablet by mouth daily., Disp: , Rfl:  .  omeprazole (PRILOSEC) 20 MG capsule, Take 1 capsule (20 mg total) by mouth daily., Disp: 90 capsule, Rfl: 3 .  ORACEA 40 MG capsule, PRN, Disp: , Rfl:  .  Polyethylene Glycol 3350 (MIRALAX PO), Take by mouth daily as needed., Disp: , Rfl:  .   psyllium (REGULOID) 0.52 G capsule, Take 0.52 g by mouth daily., Disp: , Rfl:  .  ranitidine (ZANTAC) 150 MG tablet, Take 1 tablet (150 mg total) by mouth daily., Disp: 90 tablet, Rfl: 3 .  SOOLANTRA 1 % CREA, , Disp: , Rfl:  .  spironolactone (ALDACTONE) 50 MG tablet, 50 mg 2 (two) times daily. , Disp: , Rfl:  .  traMADol (ULTRAM) 50 MG tablet, Take 1 tablet (50 mg total) by mouth every 6 (six) hours as needed., Disp: 100 tablet, Rfl: 5 .  TURMERIC PO, Take by mouth daily., Disp: , Rfl:  .  WELCHOL 625 MG tablet, Take 2 tablets by mouth 2 (two) times daily with a meal. , Disp: , Rfl:  .  aspirin EC 81 MG tablet, Take 81 mg by mouth daily., Disp: , Rfl:  .  Omega-3 Fatty Acids (FISH OIL BURP-LESS) 1000 MG CAPS, Take by mouth., Disp: , Rfl:   Review of Systems  Constitutional: Positive for fatigue.  HENT: Positive for mouth sores.   Eyes: Negative.   Respiratory: Positive for shortness of breath.   Cardiovascular: Negative.   Gastrointestinal: Positive for constipation.  Endocrine: Negative.   Genitourinary: Negative.   Musculoskeletal: Positive for back  pain.  Skin: Negative.   Allergic/Immunologic: Negative.   Neurological: Negative.   Hematological: Negative.     Social History   Tobacco Use  . Smoking status: Never Smoker  . Smokeless tobacco: Never Used  Substance Use Topics  . Alcohol use: No   Objective:   BP 122/64 (BP Location: Left Arm, Patient Position: Sitting, Cuff Size: Large)   Pulse 78   Temp 97.9 F (36.6 C) (Oral)   Resp 16   Wt 187 lb (84.8 kg)   SpO2 99%   BMI 32.10 kg/m  Vitals:   10/19/17 1114  BP: 122/64  Pulse: 78  Resp: 16  Temp: 97.9 F (36.6 C)  TempSrc: Oral  SpO2: 99%  Weight: 187 lb (84.8 kg)     Physical Exam  Constitutional: She is oriented to person, place, and time. She appears well-developed and well-nourished.  Eyes: Conjunctivae and EOM are normal. Pupils are equal, round, and reactive to light.  Neck: Normal range of  motion. Neck supple.  Cardiovascular: Normal rate, regular rhythm, normal heart sounds and intact distal pulses.  Pulmonary/Chest: Effort normal and breath sounds normal.  Musculoskeletal: Normal range of motion.  Neurological: She is alert and oriented to person, place, and time. She has normal reflexes.  Skin: Skin is warm and dry.  Psychiatric: She has a normal mood and affect. Her behavior is normal. Judgment and thought content normal.        Assessment & Plan:     1. Iron deficiency anemia, unspecified iron deficiency anemia type Significant anemia. Last EGD/colonoscopy 2017.RTC 2 weeks. - CBC with Differential/Platelet - Iron and TIBC  2. Other hyperlipidemia  - Lipid panel - Comprehensive metabolic panel  3. Other fatigue  - CBC with Differential/Platelet - TSH  4. Bilateral thoracic back pain, unspecified chronicity      HPI, Exam, and A&P Transcribed under the direction and in the presence of Richard L. Cranford Mon, MD  Electronically Signed: Katina Dung, [place], MD  [facility]

## 2017-10-19 NOTE — Patient Instructions (Addendum)
Stop Tramadol and follow up in 2 weeks. Try over the counter Lysine for your lips.

## 2017-10-21 LAB — CBC WITH DIFFERENTIAL/PLATELET
BASOS ABS: 0 10*3/uL (ref 0.0–0.2)
Basos: 1 %
EOS (ABSOLUTE): 0.2 10*3/uL (ref 0.0–0.4)
EOS: 2 %
HEMOGLOBIN: 7.3 g/dL — AB (ref 11.1–15.9)
Hematocrit: 25.3 % — ABNORMAL LOW (ref 34.0–46.6)
IMMATURE GRANS (ABS): 0 10*3/uL (ref 0.0–0.1)
Immature Granulocytes: 0 %
LYMPHS: 23 %
Lymphocytes Absolute: 1.6 10*3/uL (ref 0.7–3.1)
MCH: 19.9 pg — ABNORMAL LOW (ref 26.6–33.0)
MCHC: 28.9 g/dL — ABNORMAL LOW (ref 31.5–35.7)
MCV: 69 fL — ABNORMAL LOW (ref 79–97)
MONOCYTES: 8 %
Monocytes Absolute: 0.6 10*3/uL (ref 0.1–0.9)
NEUTROS ABS: 4.6 10*3/uL (ref 1.4–7.0)
Neutrophils: 66 %
Platelets: 502 10*3/uL — ABNORMAL HIGH (ref 150–379)
RBC: 3.67 x10E6/uL — AB (ref 3.77–5.28)
RDW: 17.7 % — ABNORMAL HIGH (ref 12.3–15.4)
WBC: 7 10*3/uL (ref 3.4–10.8)

## 2017-10-21 LAB — IRON AND TIBC
IRON SATURATION: 4 % — AB (ref 15–55)
Iron: 19 ug/dL — ABNORMAL LOW (ref 27–139)
TIBC: 430 ug/dL (ref 250–450)
UIBC: 411 ug/dL — ABNORMAL HIGH (ref 118–369)

## 2017-10-21 LAB — LIPID PANEL
CHOL/HDL RATIO: 2.7 ratio (ref 0.0–4.4)
Cholesterol, Total: 133 mg/dL (ref 100–199)
HDL: 50 mg/dL (ref >39)
LDL Calculated: 68 mg/dL (ref 0–99)
Triglycerides: 74 mg/dL (ref 0–149)
VLDL CHOLESTEROL CAL: 15 mg/dL (ref 5–40)

## 2017-10-21 LAB — COMPREHENSIVE METABOLIC PANEL
A/G RATIO: 2 (ref 1.2–2.2)
ALBUMIN: 4.6 g/dL (ref 3.5–4.8)
ALK PHOS: 78 IU/L (ref 39–117)
ALT: 12 IU/L (ref 0–32)
AST: 14 IU/L (ref 0–40)
BILIRUBIN TOTAL: 0.8 mg/dL (ref 0.0–1.2)
BUN / CREAT RATIO: 12 (ref 12–28)
BUN: 10 mg/dL (ref 8–27)
CHLORIDE: 107 mmol/L — AB (ref 96–106)
CO2: 21 mmol/L (ref 20–29)
Calcium: 9 mg/dL (ref 8.7–10.3)
Creatinine, Ser: 0.86 mg/dL (ref 0.57–1.00)
GFR calc non Af Amer: 66 mL/min/{1.73_m2} (ref >59)
GFR, EST AFRICAN AMERICAN: 76 mL/min/{1.73_m2} (ref >59)
GLUCOSE: 97 mg/dL (ref 65–99)
Globulin, Total: 2.3 g/dL (ref 1.5–4.5)
POTASSIUM: 4.2 mmol/L (ref 3.5–5.2)
Sodium: 146 mmol/L — ABNORMAL HIGH (ref 134–144)
Total Protein: 6.9 g/dL (ref 6.0–8.5)

## 2017-10-21 LAB — TSH: TSH: 2.03 u[IU]/mL (ref 0.450–4.500)

## 2017-10-24 ENCOUNTER — Ambulatory Visit: Payer: Medicare Other | Admitting: Family Medicine

## 2017-10-24 ENCOUNTER — Telehealth: Payer: Self-pay | Admitting: Emergency Medicine

## 2017-10-24 ENCOUNTER — Ambulatory Visit: Payer: Self-pay | Admitting: Family Medicine

## 2017-10-24 ENCOUNTER — Other Ambulatory Visit: Payer: Self-pay

## 2017-10-24 VITALS — BP 132/60 | HR 98 | Temp 98.3°F | Resp 16

## 2017-10-24 DIAGNOSIS — D649 Anemia, unspecified: Secondary | ICD-10-CM

## 2017-10-24 MED ORDER — FERROUS SULFATE 325 (65 FE) MG PO TABS: 325.0000 mg | ORAL_TABLET | Freq: Every day | ORAL | 12 refills | Status: DC

## 2017-10-24 NOTE — Telephone Encounter (Signed)
LMTCB

## 2017-10-24 NOTE — Telephone Encounter (Signed)
-----   Message from Jerrol Banana., MD sent at 10/24/2017 10:03 AM EST ----- Have pt see me this afternoon--significant anemia.

## 2017-10-24 NOTE — Telephone Encounter (Signed)
Pt informed appt made

## 2017-10-24 NOTE — Progress Notes (Signed)
Claire Kane  MRN: [id number] DOB: 29-May-1941  Subjective:  HPI   The patient is a 77 year old female who presents for review of her CBC which revealed some anemia.  The patient reports a history of anemia around February or March of last year.  She had an Endoscopy and Colonoscopy on 12/08/15 both of which were basically normal other than hiatal hernia on endo and hemorrhoid and diverticulosis on colonoscopy.  She was then referred to Oncology and received negative report with them.  She Iron supplements which brought her levels up and they were discontiued some time in 2018.  Patient Active Problem List   Diagnosis Date Noted  . Cystocele, midline 04/05/2017  . Vaginal prolapse 04/05/2017  . Iron deficiency anemia 04/28/2016  . Shortness of breath 11/13/2015  . Carotid artery narrowing 11/13/2015  . Cataract 11/04/2015  . Clinical depression 11/04/2015  . Dyslipidemia 11/04/2015  . 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 Acid reflux 11/04/2015  . Adiposity 11/04/2015  . 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 Awareness of heartbeats 11/04/2015  . Fibrocystic breast disease 08/19/2015  . Carotid stenosis 08/19/2015  . Osteoarthritis 08/19/2015  . Obesity 08/19/2015  . Hyperlipidemia 08/19/2015  . 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 Heart murmur, systolic 75/91/6384  . 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 Other specified symptoms and signs involving the circulatory and respiratory systems 06/17/2011  . Acid indigestion 06/17/2011  . Gout 06/17/2011    Past Medical History:  Diagnosis Date  . Anemia   . Arthritis   . BRCA negative    PER PT  . 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 Heart murmur 2001  . High cholesterol   . Hypertension   . Personal history of radiation therapy 2002   BREAST CA    . Rosacea   . 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 complications     Social History   Socioeconomic History  . Marital status: Married    Spouse name: Not on file  . Number of children: Not on file  . Years of education: Not on file  . Highest education level: Not on file  Social Needs  . Financial resource strain: Not on file  . Food insecurity - worry: Not on file  . Food insecurity - inability: Not on file  . Transportation needs - medical: Not on file  . Transportation needs - non-medical: Not on file  Occupational History  . Not on file  Tobacco Use  . Smoking status: Never Smoker  . Smokeless tobacco: Never Used  Substance and Sexual Activity  . Alcohol use: No  . Drug use: No  . Sexual activity: No  Other Topics Concern  . Not on file  Social History Narrative  . Not on file    Outpatient Encounter Medications as of 10/24/2017  Medication Sig Note  . aspirin EC 81 MG tablet Take 81 mg by mouth daily.   Marland Kitchen atorvastatin (LIPITOR) 10 MG tablet Take 10 mg by mouth daily.   . calcium citrate (CALCITRATE - DOSED IN MG ELEMENTAL CALCIUM) 950 MG tablet Take 200 mg of elemental calcium by mouth 2 (two) times daily.    Marland Kitchen docusate sodium (STOOL SOFTENER) 100 MG capsule Take 100 mg by mouth daily as needed for mild constipation.   Marland Kitchen ezetimibe (ZETIA) 10  MG tablet Take 10 mg by mouth daily.   . Glucosamine 750 MG TABS Take by mouth daily.   . Multiple Vitamins-Minerals (MULTIVITAMIN WITH MINERALS) tablet Take 1 tablet by mouth daily.   . Omega-3 Fatty Acids (FISH OIL BURP-LESS) 1000 MG CAPS Take by mouth. 05/28/2015: Received from: [facility]  . omeprazole (PRILOSEC) 20 MG capsule Take 1 capsule (20 mg total) by mouth daily.   . ORACEA 40 MG capsule PRN 05/28/2015: Received from: External Pharmacy  . Polyethylene Glycol 3350 (MIRALAX PO) Take by mouth daily as needed.   . psyllium (REGULOID) 0.52 G capsule Take 0.52 g by mouth daily.   . ranitidine  (ZANTAC) 150 MG tablet Take 1 tablet (150 mg total) by mouth daily.   . SOOLANTRA 1 % CREA  07/25/2016: Received from: External Pharmacy  . spironolactone (ALDACTONE) 50 MG tablet 50 mg 2 (two) times daily.  05/28/2015: Received from: [facility]  . traMADol (ULTRAM) 50 MG tablet Take 1 tablet (50 mg total) by mouth every 6 (six) hours as needed.   . TURMERIC PO Take by mouth daily.   . WELCHOL 625 MG tablet Take 2 tablets by mouth 2 (two) times daily with a meal.  07/25/2013: Received from: External Pharmacy Received Sig:    No facility-administered encounter medications on file as of 10/24/2017.     No Known Allergies  Review of Systems  Constitutional: Positive for malaise/fatigue. Negative for fever.  HENT: Negative for nosebleeds.   Respiratory: Positive for shortness of breath. Negative for cough.   Cardiovascular: Negative for chest pain and palpitations.  Gastrointestinal: Positive for constipation. Negative for abdominal pain, blood in stool, diarrhea, heartburn, nausea and vomiting.  Genitourinary: Negative for hematuria.  Neurological: Negative for weakness.  Psychiatric/Behavioral: Negative.     Objective:  BP 132/60 (BP Location: Right Arm, Patient Position: Sitting, Cuff Size: Normal)   Pulse 98   Temp 98.3 F (36.8 C) (Oral)   Resp 16   Physical Exam  Constitutional: She is oriented to person, place, and time and well-developed, well-nourished, and in no distress.  HENT:  Head: Normocephalic and atraumatic.  Right Ear: External ear normal.  Left Ear: External ear normal.  Nose: Nose normal.  Mouth/Throat: Oropharynx is clear and moist.  Eyes: No scleral icterus.  Conjunctiva pale.  Cardiovascular: Normal rate, regular rhythm and normal heart sounds.  Pulmonary/Chest: Effort normal and breath sounds normal.  Abdominal: Soft.  Neurological: She is alert and oriented to person, place, and time. Gait normal. GCS score is 15.  Skin: Skin is warm and  dry.  Psychiatric: Mood, memory, affect and judgment normal.    Assessment and Plan :  1. Anemia, unspecified type Normal GI w/u less than 1 year ago.If HGB does not come up will refer to hematology. Does one  last GI referral need capsule endoscopy? - ferrous sulfate (IRON SUPPLEMENT) 325 (65 FE) MG tablet; Take 1 tablet (325 mg total) by mouth daily with breakfast.  Dispense: 30 tablet; Refill: 12 - CBC with Differential/Platelet - Iron - CBC with Differential/Platelet; Future - Iron; Future - Iron - CBC with Differential/Platelet 2.HLD  I have done the exam and reviewed the chart and it is accurate to the best of my knowledge. Dragon  technology has been used and  any errors in dictation or transcription are unintentional. Richard Gilbert M.D. [facility]   

## 2017-10-25 ENCOUNTER — Telehealth: Payer: Self-pay | Admitting: Emergency Medicine

## 2017-10-25 LAB — CBC WITH DIFFERENTIAL/PLATELET
BASOS: 0 %
Basophils Absolute: 0 10*3/uL (ref 0.0–0.2)
EOS (ABSOLUTE): 0.3 10*3/uL (ref 0.0–0.4)
EOS: 3 %
HEMATOCRIT: 25.3 % — AB (ref 34.0–46.6)
Hemoglobin: 7 g/dL — CL (ref 11.1–15.9)
Immature Grans (Abs): 0 10*3/uL (ref 0.0–0.1)
Immature Granulocytes: 0 %
LYMPHS ABS: 3 10*3/uL (ref 0.7–3.1)
Lymphs: 34 %
MCH: 18.8 pg — ABNORMAL LOW (ref 26.6–33.0)
MCHC: 27.7 g/dL — AB (ref 31.5–35.7)
MCV: 68 fL — AB (ref 79–97)
Monocytes Absolute: 0.7 10*3/uL (ref 0.1–0.9)
Monocytes: 8 %
Neutrophils Absolute: 5 10*3/uL (ref 1.4–7.0)
Neutrophils: 55 %
Platelets: 520 10*3/uL — ABNORMAL HIGH (ref 150–379)
RBC: 3.73 x10E6/uL — AB (ref 3.77–5.28)
RDW: 17.6 % — AB (ref 12.3–15.4)
WBC: 9.1 10*3/uL (ref 3.4–10.8)

## 2017-10-25 LAB — IRON: Iron: 9 ug/dL — CL (ref 27–139)

## 2017-10-25 NOTE — Telephone Encounter (Signed)
-----   Message from Jerrol Banana., MD sent at 10/25/2017  9:17 AM EST ----- Relatively stable--repeat in 1 week as planned unless pt feels worse.

## 2017-10-25 NOTE — Telephone Encounter (Signed)
LMTCB

## 2017-10-25 NOTE — Telephone Encounter (Signed)
Pt informed and voiced understanding of results. 

## 2017-11-01 LAB — IRON: Iron: 50 ug/dL (ref 27–139)

## 2017-11-02 ENCOUNTER — Encounter: Payer: Self-pay | Admitting: *Deleted

## 2017-11-02 ENCOUNTER — Ambulatory Visit: Payer: Self-pay | Admitting: Family Medicine

## 2017-11-13 ENCOUNTER — Telehealth: Payer: Self-pay | Admitting: Family Medicine

## 2017-11-13 ENCOUNTER — Other Ambulatory Visit: Payer: Self-pay

## 2017-11-13 DIAGNOSIS — D509 Iron deficiency anemia, unspecified: Secondary | ICD-10-CM

## 2017-11-13 NOTE — Telephone Encounter (Signed)
Pt stated she is scheduled for follow up for anemia and pt is wanting to know if Dr. Rosanna Randy would like to have her have labs done prior to the appt. If so, pt request a lab slip. Please advise. Thanks TNP

## 2017-11-13 NOTE — Telephone Encounter (Signed)
Orders placed.

## 2017-11-15 ENCOUNTER — Telehealth: Payer: Self-pay | Admitting: Emergency Medicine

## 2017-11-15 DIAGNOSIS — D509 Iron deficiency anemia, unspecified: Secondary | ICD-10-CM

## 2017-11-15 LAB — COMPREHENSIVE METABOLIC PANEL
ALT: 14 IU/L (ref 0–32)
AST: 17 IU/L (ref 0–40)
Albumin/Globulin Ratio: 1.7 (ref 1.2–2.2)
Albumin: 4.3 g/dL (ref 3.5–4.8)
Alkaline Phosphatase: 75 IU/L (ref 39–117)
BUN/Creatinine Ratio: 11 — ABNORMAL LOW (ref 12–28)
BUN: 9 mg/dL (ref 8–27)
Bilirubin Total: 0.6 mg/dL (ref 0.0–1.2)
CALCIUM: 9.1 mg/dL (ref 8.7–10.3)
CO2: 20 mmol/L (ref 20–29)
CREATININE: 0.82 mg/dL (ref 0.57–1.00)
Chloride: 104 mmol/L (ref 96–106)
GFR calc Af Amer: 80 mL/min/{1.73_m2} (ref >59)
GFR, EST NON AFRICAN AMERICAN: 70 mL/min/{1.73_m2} (ref >59)
GLOBULIN, TOTAL: 2.6 g/dL (ref 1.5–4.5)
GLUCOSE: 142 mg/dL — AB (ref 65–99)
Potassium: 4.2 mmol/L (ref 3.5–5.2)
SODIUM: 141 mmol/L (ref 134–144)
Total Protein: 6.9 g/dL (ref 6.0–8.5)

## 2017-11-15 LAB — CBC WITH DIFFERENTIAL/PLATELET
Basophils Absolute: 0 10*3/uL (ref 0.0–0.2)
Basos: 0 %
EOS (ABSOLUTE): 0.2 10*3/uL (ref 0.0–0.4)
EOS: 3 %
HEMATOCRIT: 29.4 % — AB (ref 34.0–46.6)
Hemoglobin: 8.6 g/dL — ABNORMAL LOW (ref 11.1–15.9)
IMMATURE GRANULOCYTES: 0 %
Immature Grans (Abs): 0 10*3/uL (ref 0.0–0.1)
LYMPHS: 31 %
Lymphocytes Absolute: 2.6 10*3/uL (ref 0.7–3.1)
MCH: 20.1 pg — ABNORMAL LOW (ref 26.6–33.0)
MCHC: 29.3 g/dL — ABNORMAL LOW (ref 31.5–35.7)
MCV: 69 fL — ABNORMAL LOW (ref 79–97)
Monocytes Absolute: 0.5 10*3/uL (ref 0.1–0.9)
Monocytes: 6 %
NEUTROS PCT: 60 %
Neutrophils Absolute: 5 10*3/uL (ref 1.4–7.0)
Platelets: 506 10*3/uL — ABNORMAL HIGH (ref 150–379)
RBC: 4.27 x10E6/uL (ref 3.77–5.28)
RDW: 23.4 % — AB (ref 12.3–15.4)
WBC: 8.4 10*3/uL (ref 3.4–10.8)

## 2017-11-15 LAB — IRON AND TIBC
Iron Saturation: 53 % (ref 15–55)
Iron: 210 ug/dL — ABNORMAL HIGH (ref 27–139)
Total Iron Binding Capacity: 394 ug/dL (ref 250–450)
UIBC: 184 ug/dL (ref 118–369)

## 2017-11-15 NOTE — Telephone Encounter (Signed)
-----   Message from Jerrol Banana., MD sent at 11/15/2017  9:33 AM EST ----- Better--continue daily iron--would refer to [facility] for iron def anemia.

## 2017-11-15 NOTE — Telephone Encounter (Signed)
Quick visit

## 2017-11-15 NOTE — Telephone Encounter (Signed)
Pt advises and will keep appt on Tuesday.

## 2017-11-15 NOTE — Telephone Encounter (Signed)
LMTCB

## 2017-11-15 NOTE — Telephone Encounter (Signed)
Pt informed. Referral place. Pt wanted to know that since she was being referred to Gi if she needed to follow up with you next week? Please advise.

## 2017-11-21 ENCOUNTER — Other Ambulatory Visit: Payer: Self-pay

## 2017-11-21 ENCOUNTER — Ambulatory Visit (INDEPENDENT_AMBULATORY_CARE_PROVIDER_SITE_OTHER): Payer: Medicare Other | Admitting: Family Medicine

## 2017-11-21 VITALS — BP 118/70 | HR 76 | Temp 97.9°F | Resp 16 | Wt 187.0 lb

## 2017-11-21 DIAGNOSIS — E785 Hyperlipidemia, unspecified: Secondary | ICD-10-CM | POA: Diagnosis not present

## 2017-11-21 DIAGNOSIS — D509 Iron deficiency anemia, unspecified: Secondary | ICD-10-CM | POA: Diagnosis not present

## 2017-11-21 NOTE — Progress Notes (Signed)
Claire Kane  MRN: [id number] DOB: [birth year]  Subjective:  HPI  The patient is a 77 year old female who presents for follow up of her anemia.  She was last seen on 10/24/17.  Her last CBC was on 11/14/17.    CBC Latest Ref Rng & Units 11/14/2017 10/24/2017 10/20/2017  WBC 3.4 - 10.8 x10E3/uL 8.4 9.1 7.0  Hemoglobin 11.1 - 15.9 g/dL 8.6(L) 7.0(LL) 7.3(L)  Hematocrit 34.0 - 46.6 % 29.4(L) 25.3(L) 25.3(L)  Platelets 150 - 379 x10E3/uL 506(H) 520(H) 502(H)    Patient Active Problem List   Diagnosis Date Noted  . Cystocele, midline 04/05/2017  . Vaginal prolapse 04/05/2017  . Iron deficiency anemia 04/28/2016  . Shortness of breath 11/13/2015  . Carotid artery narrowing 11/13/2015  . Cataract 11/04/2015  . Clinical depression 11/04/2015  . Dyslipidemia 11/04/2015  . 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 Acid reflux 11/04/2015  . Adiposity 11/04/2015  . 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 Awareness of heartbeats 11/04/2015  . Fibrocystic breast disease 08/19/2015  . Carotid stenosis 08/19/2015  . Osteoarthritis 08/19/2015  . Obesity 08/19/2015  . Hyperlipidemia 08/19/2015  . 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 Heart murmur, systolic 92/07/9416  . 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 Other specified symptoms and signs involving the circulatory and respiratory systems 06/17/2011  . Acid indigestion 06/17/2011  . Gout 06/17/2011    Past Medical History:  Diagnosis Date  . Anemia   . Arthritis   . 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 Heart murmur 2001  . High cholesterol   . Hypertension   . Personal history of radiation therapy 2002   BREAST CA  . Rosacea   . 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 complications     Social History    Socioeconomic History  . Marital status: Married    Spouse name: Not on file  . Number of children: Not on file  . Years of education: Not on file  . Highest education level: Not on file  Social Needs  . Financial resource strain: Not on file  . Food insecurity - worry: Not on file  . Food insecurity - inability: Not on file  . Transportation needs - medical: Not on file  . Transportation needs - non-medical: Not on file  Occupational History  . Not on file  Tobacco Use  . Smoking status: Never Smoker  . Smokeless tobacco: Never Used  Substance and Sexual Activity  . Alcohol use: No  . Drug use: No  . Sexual activity: No  Other Topics Concern  . Not on file  Social History Narrative  . Not on file    Outpatient Encounter Medications as of 11/21/2017  Medication Sig Note  . atorvastatin (LIPITOR) 10 MG tablet Take 10 mg by mouth daily.   . calcium citrate (CALCITRATE - DOSED IN MG ELEMENTAL CALCIUM) 950 MG tablet Take 200 mg of elemental calcium by mouth 2 (two) times daily.    Marland Kitchen docusate sodium (STOOL SOFTENER) 100 MG capsule Take 100 mg by mouth daily as needed for mild constipation.   Marland Kitchen ezetimibe (ZETIA) 10 MG tablet Take 10 mg by mouth daily.   . ferrous sulfate (IRON SUPPLEMENT) 325 (65 FE) MG tablet Take 1 tablet (325 mg total)  by mouth daily with breakfast.   . Glucosamine 750 MG TABS Take by mouth daily.   . Multiple Vitamins-Minerals (MULTIVITAMIN WITH MINERALS) tablet Take 1 tablet by mouth daily.   . Omega-3 Fatty Acids (FISH OIL BURP-LESS) 1000 MG CAPS Take by mouth. 05/28/2015: Received from: [facility]  . omeprazole (PRILOSEC) 20 MG capsule Take 1 capsule (20 mg total) by mouth daily.   Darla Lesches 40 MG capsule PRN 05/28/2015: Received from: External Pharmacy  . Polyethylene Glycol 3350 (MIRALAX PO) Take by mouth daily as needed.   . psyllium (REGULOID) 0.52 G capsule Take 0.52 g by mouth daily.   . ranitidine (ZANTAC) 150 MG tablet Take 1 tablet  (150 mg total) by mouth daily.   . SOOLANTRA 1 % CREA  07/25/2016: Received from: External Pharmacy  . spironolactone (ALDACTONE) 50 MG tablet 50 mg 2 (two) times daily.  05/28/2015: Received from: [facility]  . traMADol (ULTRAM) 50 MG tablet Take 1 tablet (50 mg total) by mouth every 6 (six) hours as needed.   . TURMERIC PO Take by mouth daily.   [name] 625 MG tablet Take 2 tablets by mouth 2 (two) times daily with a meal.  07/25/2013: Received from: External Pharmacy Received Sig:   . [DISCONTINUED] aspirin EC 81 MG tablet Take 81 mg by mouth daily.    No facility-administered encounter medications on file as of 11/21/2017.     No Known Allergies  Review of Systems  Constitutional: Positive for malaise/fatigue (improving).  Respiratory: Positive for cough (just started yesterday) and shortness of breath (improving). Negative for wheezing.   Cardiovascular: Positive for leg swelling (ankles). Negative for chest pain, palpitations, orthopnea and claudication.  Gastrointestinal: Negative.   Skin: Negative.   Neurological: Positive for weakness (improving).  Endo/Heme/Allergies: Negative.   Psychiatric/Behavioral: Negative.     Objective:  BP 118/70 (BP Location: Right Arm, Patient Position: Sitting, Cuff Size: Normal)   Pulse 76   Temp 97.9 F (36.6 C) (Oral)   Resp 16   Wt 187 lb (84.8 kg)   BMI 32.10 kg/m   Physical Exam  Constitutional: She is oriented to person, place, and time and well-developed, well-nourished, and in no distress.  HENT:  Head: Normocephalic and atraumatic.  Right Ear: External ear normal.  Left Ear: External ear normal.  Nose: Nose normal.  Eyes: Conjunctivae are normal. No scleral icterus.  Neck: No thyromegaly present.  Cardiovascular: Normal rate, regular rhythm and normal heart sounds.  Pulmonary/Chest: Effort normal.  Abdominal: Soft.  Neurological: She is alert and oriented to person, place, and time.  Skin: Skin is warm and  dry.  Psychiatric: Mood, memory, affect and judgment normal.    Assessment and Plan :  1. Iron deficiency anemia, unspecified iron deficiency anemia type GI referral pending--capsule endoscopy? - CBC with Differential/Platelet 2.GERD  I have done the exam and reviewed the chart and it is accurate to the best of my knowledge. Development worker, community has been used and  any errors in dictation or transcription are unintentional. Miguel Aschoff M.D. [facility]

## 2017-11-22 ENCOUNTER — Telehealth: Payer: Self-pay

## 2017-11-22 LAB — CBC WITH DIFFERENTIAL/PLATELET
BASOS: 1 %
Basophils Absolute: 0 10*3/uL (ref 0.0–0.2)
EOS (ABSOLUTE): 0.2 10*3/uL (ref 0.0–0.4)
EOS: 3 %
HEMATOCRIT: 33.4 % — AB (ref 34.0–46.6)
HEMOGLOBIN: 9.3 g/dL — AB (ref 11.1–15.9)
Immature Grans (Abs): 0 10*3/uL (ref 0.0–0.1)
Immature Granulocytes: 0 %
LYMPHS ABS: 2.3 10*3/uL (ref 0.7–3.1)
Lymphs: 26 %
MCH: 20.8 pg — AB (ref 26.6–33.0)
MCHC: 27.8 g/dL — AB (ref 31.5–35.7)
MCV: 75 fL — AB (ref 79–97)
MONOS ABS: 0.7 10*3/uL (ref 0.1–0.9)
Monocytes: 8 %
NEUTROS ABS: 5.4 10*3/uL (ref 1.4–7.0)
Neutrophils: 62 %
Platelets: 521 10*3/uL — ABNORMAL HIGH (ref 150–379)
RBC: 4.47 x10E6/uL (ref 3.77–5.28)
RDW: 24.3 % — AB (ref 12.3–15.4)
WBC: 8.7 10*3/uL (ref 3.4–10.8)

## 2017-11-22 NOTE — Telephone Encounter (Signed)
-----   Message from Jerrol Banana., MD sent at 11/22/2017  8:57 AM EST ----- Improving.

## 2017-11-22 NOTE — Telephone Encounter (Signed)
Advised  ED 

## 2017-11-22 NOTE — Telephone Encounter (Signed)
[facility] [address]  ED   ----- Message from Jerrol Banana., MD sent at 11/22/2017  8:57 AM EST ----- Improving.

## 2017-11-29 ENCOUNTER — Encounter: Payer: Self-pay | Admitting: Gastroenterology

## 2017-11-29 ENCOUNTER — Ambulatory Visit: Payer: Medicare Other | Admitting: Gastroenterology

## 2017-11-29 VITALS — BP 138/79 | HR 79 | Ht 64.0 in | Wt 189.2 lb

## 2017-11-29 DIAGNOSIS — D509 Iron deficiency anemia, unspecified: Secondary | ICD-10-CM | POA: Diagnosis not present

## 2017-11-29 DIAGNOSIS — K5900 Constipation, unspecified: Secondary | ICD-10-CM | POA: Diagnosis not present

## 2017-11-29 MED ORDER — POLYETHYLENE GLYCOL 3350 17 GM/SCOOP PO POWD: 17.0000 g | Freq: Every day | ORAL | 0 refills | Status: AC | PRN

## 2017-11-29 MED ORDER — POLYETHYLENE GLYCOL 3350 17 GM/SCOOP PO POWD: 17.0000 g | Freq: Every day | ORAL | 0 refills | Status: DC | PRN

## 2017-11-29 NOTE — Progress Notes (Signed)
Jonathon Bellows MD, MRCP(U.K) [address]  [place], [place] [address]  Main: [phone]  Fax: [phone]   Gastroenterology Consultation  Referring Provider:     Jerrol Banana.,* Primary Care Physician:  Jerrol Banana., MD Primary Gastroenterologist:  Dr. Jonathon Bellows  Reason for Consultation:     Iron deficiency anemia.         HPI:   Claire Kane is a 77 y.o. y/o female referred for consultation & management  by Dr. Rosanna Randy, Retia Passe., MD.    She has been referred for iron deficiency anemia  Rectal bleeding: Occasionally when she has a hard stool Nose bleeds: none  Vaginal bleeding : has a pessary which occasionally is blood stained- she sees DR Kenton Kingfisher 3 times a year.  Hematemesis or hemoptysis : none  Blood in urine : none   History suggests long standing iron deficiency going back atleast 2 years, has been on iron in the past . CBC back in 10/2015 showed HB 8.3 grams with MCV of 75.   Never seen GI . Underwent partial GI evaluation with Dr Jamal Collin back in 2017 . EGD showed a large hiatal hernia. No biopsies taken to r/o celiac disease. Colonoscopy was in 2017 (withdrawal time only 4 minutes ) showed diverticulosis.   She says for some years has had some "weird noises " on the left side of her abdomen, no abdominal pain . Gaining weight . Constipation worse with tramadol.   Iron/TIBC/Ferritin/ %Sat    Component Value Date/Time   IRON 210 (H) 11/14/2017 1352   TIBC 394 11/14/2017 1352   FERRITIN 11 07/25/2016 1035   IRONPCTSAT 53 11/14/2017 1352   CBC Latest Ref Rng & Units 11/21/2017 11/14/2017 10/24/2017  WBC 3.4 - 10.8 x10E3/uL 8.7 8.4 9.1  Hemoglobin 11.1 - 15.9 g/dL 9.3(L) 8.6(L) 7.0(LL)  Hematocrit 34.0 - 46.6 % 33.4(L) 29.4(L) 25.3(L)  Platelets 150 - 379 x10E3/uL 521(H) 506(H) 520(H)     Past Medical History:  Diagnosis Date  . Anemia   . Arthritis   . 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 Heart murmur 2001  . High cholesterol   . Hypertension   . Personal history of radiation therapy 2002   BREAST CA  . Rosacea   . 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 complications     Past Surgical History:  Procedure Laterality Date  . ABDOMINAL HYSTERECTOMY    . BREAST CYST ASPIRATION Left 2003  . BREAST EXCISIONAL BIOPSY Right 2002   POS  . BREAST SURGERY Right 2002   lumpectomy  . CHOLECYSTECTOMY    . COLONOSCOPY  2013  . COLONOSCOPY WITH PROPOFOL N/A 12/08/2015   Procedure: COLONOSCOPY WITH PROPOFOL;  Surgeon: Christene Lye, MD;  Location: [facility] ENDOSCOPY;  Service: Endoscopy;  Laterality: N/A;  . ESOPHAGOGASTRODUODENOSCOPY (EGD) WITH PROPOFOL N/A 12/08/2015   Procedure: ESOPHAGOGASTRODUODENOSCOPY (EGD) WITH PROPOFOL;  Surgeon: Christene Lye, MD;  Location: [facility] ENDOSCOPY;  Service: Endoscopy;  Laterality: N/A;  . EYE SURGERY Right 2014  . post radiation therapy Right    right breast cancer 2002  . Stab pheblectomy   2010  . vein closure procedure Bilateral 2008    Prior to Admission medications   Medication Sig Start Date End Date Taking? Authorizing Provider  amoxicillin-clavulanate (AUGMENTIN) 875-125 MG tablet Take 1 tablet by mouth 2 (two) times daily.  11/28/17  Yes [provider]  atorvastatin (LIPITOR) 10 MG tablet Take 10 mg by mouth daily.   Yes [provider]  calcium citrate (CALCITRATE - DOSED IN MG ELEMENTAL CALCIUM) 950 MG tablet Take 200 mg of elemental calcium by mouth 2 (two) times daily.    Yes [provider]  docusate sodium (STOOL SOFTENER) 100 MG capsule Take 100 mg by mouth daily as needed for mild constipation.   Yes [provider]  ezetimibe (ZETIA) 10 MG tablet Take 10 mg by mouth daily.   Yes [provider]  ferrous sulfate (IRON SUPPLEMENT) 325 (65 FE) MG tablet Take 1 tablet (325 mg total) by mouth daily with breakfast. 10/24/17  Yes Jerrol Banana., MD  Glucosamine 750 MG TABS Take by mouth daily.   Yes [provider]  Multiple Vitamins-Minerals (MULTIVITAMIN WITH MINERALS) tablet Take 1 tablet by mouth daily.   Yes [provider]  omeprazole (PRILOSEC) 20 MG capsule Take 1 capsule (20 mg total) by mouth daily. 01/09/17  Yes Jerrol Banana., MD  Polyethylene Glycol 3350 (MIRALAX PO) Take by mouth daily as needed.   Yes [provider]  psyllium (REGULOID) 0.52 G capsule Take 0.52 g by mouth daily.   Yes [provider]  ranitidine (ZANTAC) 150 MG tablet Take 1 tablet (150 mg total) by mouth daily. 01/09/17  Yes Jerrol Banana., MD  senna (SENOKOT) 8.6 MG tablet Take 1 tablet by mouth daily.   Yes [provider]  SOOLANTRA 1 % CREA  06/30/16  Yes [provider]  spironolactone (ALDACTONE) 50 MG tablet 50 mg 2 (two) times daily.  12/29/14  Yes [provider]  traMADol (ULTRAM) 50 MG tablet Take 1 tablet (50 mg total) by mouth every 6 (six) hours as needed. 07/21/17  Yes Rubye Beach  [facility] 625 MG tablet Take 2 tablets by mouth 2 (two) times daily with a meal.  05/21/13  Yes [provider]  Omega-3 Fatty Acids (FISH OIL BURP-LESS) 1000 MG CAPS Take by mouth.    [provider]  ORACEA 40 MG capsule PRN 03/02/15   [provider]  TURMERIC PO Take by mouth daily.    [provider]    Family History  Problem Relation Age of Onset  . Heart attack Mother   . Heart attack Father   . Heart attack Sister   . Stroke Sister   . Heart attack Brother   . Other Brother        cerebral hemorrhage, sepsis  . Breast cancer Cousin 68       breast/maternal  . Cancer Cousin 56       breast/maternal  . Breast cancer Maternal Aunt 70  . Breast cancer Other 48       maternal neice     Social History   Tobacco Use  . Smoking status: Never Smoker  . Smokeless tobacco: Never Used  Substance Use Topics  . Alcohol  use: No  . Drug use: No    Allergies as of 11/29/2017  . (No Known Allergies)    Review of Systems:    All systems reviewed and negative except where noted in HPI.   Physical Exam:  BP 138/79 (BP Location: Left Arm, Patient Position: Sitting, Cuff Size: Normal)   Pulse 79   Ht 5' 4"  (1.626 m)   Wt 189 lb 3.2 oz (85.8 kg)   BMI 32.48 kg/m  No LMP recorded. Patient has had a hysterectomy. Psych:  Alert and cooperative. Normal mood and affect. General:   Alert,  Well-developed, well-nourished, pleasant and cooperative in NAD Head:  Normocephalic and atraumatic. Eyes:  Sclera clear, no icterus.   Conjunctiva pink. Ears:  Normal auditory acuity. Nose:  No deformity, discharge, or lesions. Mouth:  No deformity or lesions,oropharynx pink & moist. Neck:  Supple; no masses or thyromegaly. Lungs:  Respirations even and unlabored.  Clear throughout to auscultation.   No wheezes, crackles, or rhonchi. No acute distress. Heart:  Regular rate and rhythm; no murmurs, clicks, rubs, or gallops. Abdomen:  Normal bowel sounds.  No bruits.  Soft, non-tender and non-distended without masses, hepatosplenomegaly or hernias noted.  No guarding or rebound tenderness.    Neurologic:  Alert and oriented x3;  grossly normal neurologically. Skin:  Intact without significant lesions or rashes. No jaundice. Lymph Nodes:  No significant cervical adenopathy. Psych:  Alert and cooperative. Normal mood and affect.  Imaging Studies: No results found.  Assessment and Plan:   Claire Kane is a 77 y.o. y/o female has been referred for  iron deficiency anemia. Long standing from atleast 2017 . Partial evaluation in 2017 by Dr Jamal Collin- EGD showed a hiatal hernia , no duodenal biopsies taken, Colonoscopy showed no gross abnormalities (withdrawal time was 4 minutes), unclear why capsule study was not done . Still anemic and Hb has really not ever gone back to normal. Elevated platelet count likely from anemia.     Plan  1. Urine analysis to r/o blood loss 2. Celiac serology  3. Check b12,folate, ferritin  4. If ferritin is low then will need IV iron with hematology.  5. EGD +colonoscopy to be performed and if negative will need capsule study. Cannot proceed directly to capsule study as insurance requires EGD+colonoscopy to be done within the past 1 year and in addition the witdrawal time was very short in 2017 and hence polyps can be missed .  6. Miralax daily for constipatiom  I have discussed alternative options, risks & benefits,  which include, but are not limited to, bleeding, infection, perforation,respiratory complication & drug reaction.  The patient agrees with this plan & written consent will be obtained.    Follow up in 3 months   Dr Jonathon Bellows MD,MRCP(U.K)

## 2017-11-29 NOTE — Addendum Note (Signed)
Addended by: Peggye Ley on: 11/29/2017 12:46 PM   Modules accepted: Orders, SmartSet

## 2017-12-01 LAB — CELIAC DISEASE PANEL
ENDOMYSIAL IGA: NEGATIVE
IgA/Immunoglobulin A, Serum: 226 mg/dL (ref 64–422)
TRANSGLUTAMINASE IGA: 4 U/mL — AB (ref 0–3)

## 2017-12-01 LAB — IRON,TIBC AND FERRITIN PANEL
FERRITIN: 14 ng/mL — AB (ref 15–150)
IRON SATURATION: 56 % — AB (ref 15–55)
IRON: 209 ug/dL — AB (ref 27–139)
TIBC: 374 ug/dL (ref 250–450)
UIBC: 165 ug/dL (ref 118–369)

## 2017-12-01 LAB — URINALYSIS, ROUTINE W REFLEX MICROSCOPIC
BILIRUBIN UA: NEGATIVE
Glucose, UA: NEGATIVE
Ketones, UA: NEGATIVE
Nitrite, UA: NEGATIVE
PH UA: 6 (ref 5.0–7.5)
PROTEIN UA: NEGATIVE
RBC UA: NEGATIVE
Specific Gravity, UA: 1.007 (ref 1.005–1.030)
UUROB: 0.2 mg/dL (ref 0.2–1.0)

## 2017-12-01 LAB — MICROSCOPIC EXAMINATION: CASTS: NONE SEEN /LPF

## 2017-12-01 LAB — B12 AND FOLATE PANEL
Folate: >20 ng/mL (ref >3.0)
VITAMIN B 12: 425 pg/mL (ref 232–1245)

## 2017-12-05 ENCOUNTER — Encounter: Payer: Self-pay | Admitting: Emergency Medicine

## 2017-12-05 ENCOUNTER — Telehealth: Payer: Self-pay

## 2017-12-05 NOTE — Telephone Encounter (Signed)
Advised patient of results per Dr. Vicente Males      - positive celiac disease. Biopsies will be taken during EGD to confirm.    - Dr. Tasia Catchings for Iron infusion.   Patient states she had the infusion appointment setup already. She's fine with the biopsies being taken.   Preparing for procedure tomorrow.   - No UTI symptoms other than frequent urination. She states that this has been an ongoing problem for a while now.   Advised patient I would send UTI information over to Dr. Alben Spittle office for follow-up.

## 2017-12-06 ENCOUNTER — Ambulatory Visit
Admission: RE | Admit: 2017-12-06 | Discharge: 2017-12-06 | Disposition: A | Discharge status: Home / Self Care | Payer: Medicare Other | Source: Ambulatory Visit | Attending: Gastroenterology | Admitting: Gastroenterology

## 2017-12-06 ENCOUNTER — Ambulatory Visit: Payer: Medicare Other | Admitting: Certified Registered Nurse Anesthetist

## 2017-12-06 ENCOUNTER — Encounter
Admission: RE | Disposition: A | Discharge status: Home / Self Care | Payer: Self-pay | Source: Ambulatory Visit | Attending: Gastroenterology

## 2017-12-06 ENCOUNTER — Encounter: Payer: Self-pay | Admitting: *Deleted

## 2017-12-06 DIAGNOSIS — Z539 Procedure and treatment not carried out, unspecified reason: Secondary | ICD-10-CM | POA: Diagnosis not present

## 2017-12-06 DIAGNOSIS — D509 Iron deficiency anemia, unspecified: Secondary | ICD-10-CM | POA: Diagnosis present

## 2017-12-06 HISTORY — PX: ESOPHAGOGASTRODUODENOSCOPY (EGD) WITH PROPOFOL: SHX5813

## 2017-12-06 HISTORY — PX: COLONOSCOPY WITH PROPOFOL: SHX5780

## 2017-12-06 SURGERY — COLONOSCOPY WITH PROPOFOL
Anesthesia: General

## 2017-12-06 MED ORDER — FENTANYL CITRATE (PF) 100 MCG/2ML IJ SOLN
INTRAMUSCULAR | Status: AC
  Filled 2017-12-06: qty 2

## 2017-12-06 MED ORDER — SODIUM CHLORIDE 0.9 % IV SOLN
INTRAVENOUS | Status: DC
  Administered 2017-12-06: 14:00:00 via INTRAVENOUS

## 2017-12-07 ENCOUNTER — Encounter: Payer: Self-pay | Admitting: Oncology

## 2017-12-07 ENCOUNTER — Inpatient Hospital Stay: Payer: Medicare Other | Attending: Oncology | Admitting: Oncology

## 2017-12-07 VITALS — BP 136/84 | HR 83 | Temp 98.4°F | Wt 185.1 lb

## 2017-12-07 DIAGNOSIS — Z803 Family history of malignant neoplasm of breast: Secondary | ICD-10-CM

## 2017-12-07 DIAGNOSIS — Z86000 Personal history of in-situ neoplasm of breast: Secondary | ICD-10-CM | POA: Diagnosis not present

## 2017-12-07 DIAGNOSIS — D509 Iron deficiency anemia, unspecified: Secondary | ICD-10-CM | POA: Diagnosis not present

## 2017-12-07 NOTE — Progress Notes (Signed)
Patient here today as a new patient with anemia  

## 2017-12-07 NOTE — Progress Notes (Signed)
Hematology/Oncology Consult note [facility] Telephone:([phone] [phone]   Patient Care Team: Jerrol Banana., MD as PCP - General (Family Medicine) Christene Lye, MD (General Surgery) Estill Cotta, MD as Consulting Physician (Ophthalmology) Gae Dry, MD as Referring Physician (Obstetrics and Gynecology)  REFERRING PROVIDER: Nilsa Nutting REASON FOR VISIT Evaluation of anemia  HISTORY OF PRESENTING ILLNESS:  Claire Kane is a  77 y.o.  female with PMH listed below who was referred to me for evaluation of anemia.  Patient reports she was told that she had iron deficiency 2 years ago and she took iron supplementation for a while and was told to stop. Recently found to to have worsening of anemia. She had Upper and lower endoscopy in 2017 by Dr.Sankar # Upper endoscopy 12/07/2017 Normal examined duodenum. - Large hiatal hernia. - Erythematous mucosa in the antrum. Biopsied. - The examination was otherwise normal # Colonoscopy on 12/07/2017  Non-thrombosed external hemorrhoids found on perianal exam. - Diverticulosis in the sigmoid colon and in the descending colon. - The examination was otherwise normal on direct and retroflexion views. - No specimens collected.  Remote history of DCIS right breast in  2002, s/p surgery and RT.  Recently seen by Dr.Anna. Plan endoscopy +/-caspsule study. Just had an unsuccessful prep, procedure is delayed. Report fatigue, lack of energy. Occasionally see blood in stool when stool is hard. Denies hematemesis or hemoptysis, or blood in urine. On both omeprazole and zantac for GERD. Celiac panel positive.   Review of Systems  Constitutional: Positive for malaise/fatigue. Negative for chills, diaphoresis and fever.  HENT: Negative for nosebleeds.   Eyes: Negative for photophobia and discharge.  Respiratory: Negative for cough, hemoptysis and sputum production.   Cardiovascular:  Negative for chest pain and orthopnea.  Gastrointestinal: Positive for blood in stool and heartburn. Negative for abdominal pain, constipation, nausea and vomiting.  Genitourinary: Negative for dysuria and urgency.  Musculoskeletal: Negative for myalgias and neck pain.  Skin: Negative for rash.  Neurological: Negative for dizziness, tremors and sensory change.  Endo/Heme/Allergies: Does not bruise/bleed easily.  Psychiatric/Behavioral: Negative for depression.    MEDICAL HISTORY:  Past Medical History:  Diagnosis Date  . Anemia   . Arthritis   . 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 Cataract   . Heart murmur 2001  . High cholesterol   . Hypertension   . Personal history of radiation therapy 2002   BREAST CA  . Rosacea   . 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 complications     SURGICAL HISTORY: Past Surgical History:  Procedure Laterality Date  . ABDOMINAL HYSTERECTOMY    . BREAST CYST ASPIRATION Left 2003  . BREAST EXCISIONAL BIOPSY Right 2002   POS  . BREAST SURGERY Right 2002   lumpectomy  . CHOLECYSTECTOMY    . COLONOSCOPY  2013  . COLONOSCOPY WITH PROPOFOL N/A 12/08/2015   Procedure: COLONOSCOPY WITH PROPOFOL;  Surgeon: Christene Lye, MD;  Location: [facility] ENDOSCOPY;  Service: Endoscopy;  Laterality: N/A;  . ESOPHAGOGASTRODUODENOSCOPY (EGD) WITH PROPOFOL N/A 12/08/2015   Procedure: ESOPHAGOGASTRODUODENOSCOPY (EGD) WITH PROPOFOL;  Surgeon: Christene Lye, MD;  Location: [facility] ENDOSCOPY;  Service: Endoscopy;  Laterality: N/A;  . EYE SURGERY Right 2014  . post radiation therapy Right    right breast cancer 2002  . Stab pheblectomy   2010  . vein closure procedure Bilateral 2008  SOCIAL HISTORY: Social History   Socioeconomic History  . Marital status: Married    Spouse name: Not on file  . Number of children: Not on file  . Years of education: Not on file  . Highest education  level: Not on file  Occupational History  . Not on file  Social Needs  . Financial resource strain: Not on file  . Food insecurity:    Worry: Not on file    Inability: Not on file  . Transportation needs:    Medical: Not on file    Non-medical: Not on file  Tobacco Use  . Smoking status: Never Smoker  . Smokeless tobacco: Never Used  Substance and Sexual Activity  . Alcohol use: No  . Drug use: No  . Sexual activity: Never  Lifestyle  . Physical activity:    Days per week: Not on file    Minutes per session: Not on file  . Stress: Not on file  Relationships  . Social connections:    Talks on phone: Not on file    Gets together: Not on file    Attends religious service: Not on file    Active member of club or organization: Not on file    Attends meetings of clubs or organizations: Not on file    Relationship status: Not on file  . Intimate partner violence:    Fear of current or ex partner: Not on file    Emotionally abused: Not on file    Physically abused: Not on file    Forced sexual activity: Not on file  Other Topics Concern  . Not on file  Social History Narrative  . Not on file    FAMILY HISTORY: Family History  Problem Relation Age of Onset  . Heart attack Mother   . Heart attack Father   . Heart attack Sister   . Stroke Sister   . Heart attack Brother   . Other Brother        cerebral hemorrhage, sepsis  . Breast cancer Cousin 31       breast/maternal  . Cancer Cousin 75       breast/maternal  . Breast cancer Maternal Aunt 70  . Breast cancer Other 48       maternal neice    ALLERGIES:  has No Known Allergies.  MEDICATIONS:  Current Outpatient Medications  Medication Sig Dispense Refill  . amoxicillin-clavulanate (AUGMENTIN) 875-125 MG tablet Take 1 tablet by mouth 2 (two) times daily.  0  . atorvastatin (LIPITOR) 10 MG tablet Take 10 mg by mouth daily.    . calcium citrate (CALCITRATE - DOSED IN MG ELEMENTAL CALCIUM) 950 MG tablet Take 200  mg of elemental calcium by mouth 2 (two) times daily.     Marland Kitchen docusate sodium (STOOL SOFTENER) 100 MG capsule Take 100 mg by mouth daily as needed for mild constipation.    Marland Kitchen ezetimibe (ZETIA) 10 MG tablet Take 10 mg by mouth daily.    . ferrous sulfate (IRON SUPPLEMENT) 325 (65 FE) MG tablet Take 1 tablet (325 mg total) by mouth daily with breakfast. 30 tablet 12  . Glucosamine 750 MG TABS Take by mouth daily.    . Multiple Vitamins-Minerals (MULTIVITAMIN WITH MINERALS) tablet Take 1 tablet by mouth daily.    . Omega-3 Fatty Acids (FISH OIL BURP-LESS) 1000 MG CAPS Take by mouth.    Marland Kitchen omeprazole (PRILOSEC) 20 MG capsule Take 1 capsule (20 mg total) by mouth daily. 90 capsule 3  .  ORACEA 40 MG capsule PRN    . polyethylene glycol powder (MIRALAX) powder Take 17 g by mouth daily as needed. 255 g 0  . psyllium (REGULOID) 0.52 G capsule Take 0.52 g by mouth daily.    . ranitidine (ZANTAC) 150 MG tablet Take 1 tablet (150 mg total) by mouth daily. 90 tablet 3  . senna (SENOKOT) 8.6 MG tablet Take 1 tablet by mouth daily.    . SOOLANTRA 1 % CREA     . spironolactone (ALDACTONE) 50 MG tablet 50 mg 2 (two) times daily.     . traMADol (ULTRAM) 50 MG tablet Take 1 tablet (50 mg total) by mouth every 6 (six) hours as needed. 100 tablet 5  . TURMERIC PO Take by mouth daily.    [name] 625 MG tablet Take 2 tablets by mouth 2 (two) times daily with a meal.      No current facility-administered medications for this visit.      PHYSICAL EXAMINATION: ECOG PERFORMANCE STATUS: 0 - Asymptomatic Vitals:   12/07/17 0942  BP: 136/84  Pulse: 83  Temp: 98.4 F (36.9 C)   Filed Weights   12/07/17 0942  Weight: 185 lb 1.2 oz (84 kg)    Physical Exam  Constitutional: She is oriented to person, place, and time and well-developed, well-nourished, and in no distress. No distress.  HENT:  Head: Normocephalic and atraumatic.  Eyes: Pupils are equal, round, and reactive to light. EOM are normal. No scleral  icterus.  Neck: Normal range of motion. Neck supple.  Cardiovascular: Normal rate, regular rhythm and normal heart sounds.  Pulmonary/Chest: Breath sounds normal. No respiratory distress.  Abdominal: Soft. Bowel sounds are normal. She exhibits no mass.  Musculoskeletal: Normal range of motion. She exhibits no edema.  Neurological: She is alert and oriented to person, place, and time. No cranial nerve deficit.  Skin: Skin is warm and dry.  Psychiatric: Affect and judgment normal.     LABORATORY DATA:  I have reviewed the data as listed Lab Results  Component Value Date   WBC 8.7 11/21/2017   HGB 9.3 (L) 11/21/2017   HCT 33.4 (L) 11/21/2017   MCV 75 (L) 11/21/2017   PLT 521 (H) 11/21/2017   Recent Labs    12/13/16 1601 10/20/17 0919 11/14/17 1352  NA 142 146* 141  K 4.5 4.2 4.2  CL 101 107* 104  CO2 _0 GLUCOSE 98 97 142*  BUN _1 CREATININE 0.78 0.86 0.82  CALCIUM 9.4 9.0 9.1  GFRNONAA 75 66 70  GFRAA 86 76 80  PROT  --  6.9 6.9  ALBUMIN 4.4 4.6 4.3  AST  --  14 17  ALT  --  12 14  ALKPHOS  --  78 75  BILITOT  --  0.8 0.6       ASSESSMENT & PLAN:  Patient is a 77 year old female present for evaluation of iron deficiency anemia. 1. Iron deficiency anemia, unspecified iron deficiency anemia type   2. Microcytic anemia   3. History of ductal carcinoma in situ (DCIS) of breast    Plan IV iron with Venofer 273m weekly for 4 doses. Allergy reactions/infusion reaction including anaphylactic reaction discussed with patient. Patient voices understanding and willing to proceed. Follow Up with GI to complete work up.  Patient can follow up one week after completion of 4th dose and reassessment for additional IV venofer.   # Family history of breast cancer, personal history of DCIS.  Will discuss about genetic testing at next visit.  All questions were answered. The patient knows to call the clinic with any problems questions or concerns.  Return of visit: 5  weeks. Thank you for this kind referral and the opportunity to participate in the care of this patient. A copy of today's note is routed to referring provider    Earlie Server, MD, PhD Hematology Oncology [facility] Pager- [phone] 12/07/2017

## 2017-12-08 ENCOUNTER — Encounter: Payer: Self-pay | Admitting: Gastroenterology

## 2017-12-11 ENCOUNTER — Inpatient Hospital Stay: Payer: Medicare Other

## 2017-12-11 VITALS — BP 117/74 | HR 66 | Temp 96.9°F | Resp 20

## 2017-12-11 DIAGNOSIS — D509 Iron deficiency anemia, unspecified: Secondary | ICD-10-CM

## 2017-12-11 MED ORDER — IRON SUCROSE 20 MG/ML IV SOLN
INTRAVENOUS | Status: AC
  Filled 2017-12-11: qty 10

## 2017-12-11 MED ORDER — SODIUM CHLORIDE 0.9 % IV SOLN
Freq: Once | INTRAVENOUS | Status: AC
  Administered 2017-12-11: 10:00:00 via INTRAVENOUS
  Filled 2017-12-11: qty 1000

## 2017-12-11 MED ORDER — IRON SUCROSE 20 MG/ML IV SOLN
200.0000 mg | Freq: Once | INTRAVENOUS | Status: AC
  Administered 2017-12-11: 200 mg via INTRAVENOUS
  Filled 2017-12-11: qty 10

## 2017-12-11 NOTE — Patient Instructions (Signed)
Iron Sucrose injection What is this medicine? IRON SUCROSE (AHY ern SOO krohs) is an iron complex. Iron is used to make healthy red blood cells, which carry oxygen and nutrients throughout the body. This medicine is used to treat iron deficiency anemia in people with chronic kidney disease. This medicine may be used for other purposes; ask your health care provider or pharmacist if you have questions. COMMON BRAND NAME(S): Venofer What should I tell my health care provider before I take this medicine? They need to know if you have any of these conditions: -anemia not caused by low iron levels -heart disease -high levels of iron in the blood -kidney disease -liver disease -an unusual or allergic reaction to iron, other medicines, foods, dyes, or preservatives -pregnant or trying to get pregnant -breast-feeding How should I use this medicine? This medicine is for infusion into a vein. It is given by a health care professional in a hospital or clinic setting. Talk to your pediatrician regarding the use of this medicine in children. While this drug may be prescribed for children as young as 2 years for selected conditions, precautions do apply. Overdosage: If you think you have taken too much of this medicine contact a poison control center or emergency room at once. NOTE: This medicine is only for you. Do not share this medicine with others. What if I miss a dose? It is important not to miss your dose. Call your doctor or health care professional if you are unable to keep an appointment. What may interact with this medicine? Do not take this medicine with any of the following medications: -deferoxamine -dimercaprol -other iron products This medicine may also interact with the following medications: -chloramphenicol -deferasirox This list may not describe all possible interactions. Give your health care provider a list of all the medicines, herbs, non-prescription drugs, or dietary  supplements you use. Also tell them if you smoke, drink alcohol, or use illegal drugs. Some items may interact with your medicine. What should I watch for while using this medicine? Visit your doctor or healthcare professional regularly. Tell your doctor or healthcare professional if your symptoms do not start to get better or if they get worse. You may need blood work done while you are taking this medicine. You may need to follow a special diet. Talk to your doctor. Foods that contain iron include: whole grains/cereals, dried fruits, beans, or peas, leafy green vegetables, and organ meats (liver, kidney). What side effects may I notice from receiving this medicine? Side effects that you should report to your doctor or health care professional as soon as possible: -allergic reactions like skin rash, itching or hives, swelling of the face, lips, or tongue -breathing problems -changes in blood pressure -cough -fast, irregular heartbeat -feeling faint or lightheaded, falls -fever or chills -flushing, sweating, or hot feelings -joint or muscle aches/pains -seizures -swelling of the ankles or feet -unusually weak or tired Side effects that usually do not require medical attention (report to your doctor or health care professional if they continue or are bothersome): -diarrhea -feeling achy -headache -irritation at site where injected -nausea, vomiting -stomach upset -tiredness This list may not describe all possible side effects. Call your doctor for medical advice about side effects. You may report side effects to FDA at 1-800-FDA-1088. Where should I keep my medicine? This drug is given in a hospital or clinic and will not be stored at home. NOTE: This sheet is a summary. It may not cover all possible information. If   you have questions about this medicine, talk to your doctor, pharmacist, or health care provider.  2018 Elsevier/Gold Standard (2011-06-16 17:14:35)  

## 2017-12-12 ENCOUNTER — Encounter: Payer: Self-pay | Admitting: Obstetrics & Gynecology

## 2017-12-12 ENCOUNTER — Telehealth: Payer: Self-pay

## 2017-12-12 ENCOUNTER — Other Ambulatory Visit: Payer: Self-pay

## 2017-12-12 ENCOUNTER — Ambulatory Visit: Payer: Medicare Other | Admitting: Obstetrics & Gynecology

## 2017-12-12 VITALS — BP 140/80 | HR 99 | Ht 64.0 in | Wt 186.0 lb

## 2017-12-12 DIAGNOSIS — N8110 Cystocele, unspecified: Secondary | ICD-10-CM

## 2017-12-12 DIAGNOSIS — N8111 Cystocele, midline: Secondary | ICD-10-CM

## 2017-12-12 DIAGNOSIS — D509 Iron deficiency anemia, unspecified: Secondary | ICD-10-CM

## 2017-12-12 DIAGNOSIS — N3000 Acute cystitis without hematuria: Secondary | ICD-10-CM | POA: Insufficient documentation

## 2017-12-12 DIAGNOSIS — R350 Frequency of micturition: Secondary | ICD-10-CM | POA: Diagnosis not present

## 2017-12-12 DIAGNOSIS — Z86010 Personal history of colonic polyps: Secondary | ICD-10-CM

## 2017-12-12 LAB — POCT URINALYSIS DIPSTICK
BILIRUBIN UA: NEGATIVE
Glucose, UA: NEGATIVE
KETONES UA: NEGATIVE
NITRITE UA: NEGATIVE
PH UA: 6 (ref 5.0–8.0)
Protein, UA: NEGATIVE
RBC UA: NEGATIVE
SPEC GRAV UA: 1.015 (ref 1.010–1.025)
UROBILINOGEN UA: 0.2 U/dL

## 2017-12-12 MED ORDER — SULFAMETHOXAZOLE-TRIMETHOPRIM 800-160 MG PO TABS: 1.0000 | ORAL_TABLET | Freq: Two times a day (BID) | ORAL | 0 refills | Status: DC

## 2017-12-12 MED ORDER — PEG 3350-KCL-NA BICARB-NACL 420 G PO SOLR: 4000.0000 mL | Freq: Once | ORAL | 0 refills | Status: AC

## 2017-12-12 NOTE — Telephone Encounter (Signed)
Advised patient of 2-day colonoscopy prep requested by Dr. Vicente Males.   Day 1: No food after 12 noon.   Drink 10oz of Mag Citrate  Continue on a clear liquid diet  Day 2: Clear liquid diet  Drink prep solution at 5pm.  Continue to stay hydrated.  No food or drink 4 hours prior to  Procedure.  Patient calls Endo the day prior for time.  Prep starts on Wednesday, 4/3.

## 2017-12-12 NOTE — Progress Notes (Signed)
HPI:      [name] is a 77 y.o. G3P2 who presents today for her pessary follow up and examination related to her pelvic floor weakening.  Pt reports tolerating the pessary well with no vaginal bleeding and no vaginal discharge.  Symptoms of pelvic floor weakening have greatly improved. She is voiding and defecating without difficulty. She currently has a #4 cup type pessary. Recent h/o UTI.  Freq and urge, no dysuria.  PMHx: She  has a past medical history of Anemia, Anxiety, Arthritis, BRCA negative (08/04/2015), Breast cancer ([place]) (2002), Cataract, GERD (gastroesophageal reflux disease), Heart murmur (2001), Hiatal hernia, High cholesterol, Hypertension, Personal history of radiation therapy (2002), Rosacea, and 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 complications. Also,  has a past surgical history that includes Stab pheblectomy  (2010); vein closure procedure (Bilateral, 2008); Abdominal hysterectomy; Cholecystectomy; Breast surgery (Right, 2002); Eye surgery (Right, 2014); Colonoscopy (2013); post radiation therapy (Right); Colonoscopy with propofol (N/A, 12/08/2015); Esophagogastroduodenoscopy (egd) with propofol (N/A, 12/08/2015); Breast excisional biopsy (Right, 2002); Breast cyst aspiration (Left, 2003); Colonoscopy with propofol (N/A, 12/06/2017); and Esophagogastroduodenoscopy (egd) with propofol (N/A, 12/06/2017)., family history includes Breast cancer (age of onset: 46) in her other; Breast cancer (age of onset: 53) in her cousin; Breast cancer (age of onset: 27) in her maternal aunt; Cancer (age of onset: 34) in her cousin; Heart attack in her brother, father, mother, and sister; Other in her brother; Stroke in her sister.,  reports that she has never smoked. She has never used smokeless tobacco. She reports that she does not drink alcohol or use drugs.  She has a current medication list which includes the following prescription(s): amoxicillin-clavulanate, atorvastatin, calcium  citrate, docusate sodium, ezetimibe, glucosamine, multivitamin with minerals, omeprazole, oracea, polyethylene glycol powder, psyllium, ranitidine, senna, soolantra, spironolactone, tramadol, turmeric, and welchol. Also, has No Known Allergies.  Review of Systems  All other systems reviewed and are negative.  Objective: There were no vitals taken for this visit. Physical Exam  Constitutional: She is oriented to person, place, and time. She appears well-developed and well-nourished. No distress.  Genitourinary: Vagina normal. Pelvic exam was performed with patient supine. There is no rash, tenderness or lesion on the right labia. There is no rash, tenderness or lesion on the left labia. No erythema or bleeding in the vagina.  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 She exhibits no distension. There is no tenderness.  Musculoskeletal: Normal range of motion.  Neurological: She is alert and oriented to person, place, and time. No cranial nerve deficit.  Skin: Skin is warm and dry.  Psychiatric: She has a normal mood and affect.  Back- No CVAT  Pessary Care Pessary removed and cleaned.  Vagina checked - without erosions - pessary replaced.  Results for orders placed or performed in visit on 12/12/17  POCT urinalysis dipstick  Result Value Ref Range   Color, UA     Clarity, UA     Glucose, UA neg    Bilirubin, UA neg    Ketones, UA neg    Spec Grav, UA 1.015 1.010 - 1.025   Blood, UA neg    pH, UA 6.0 5.0 - 8.0   Protein, UA neg    Urobilinogen, UA 0.2 0.2 or 1.0 E.U./dL   Nitrite, UA neg    Leukocytes, UA Large (3+) (A) Negative   Appearance     Odor  A/P: UTI and Pelvic Floor Weakening/ Cystocele Bactrim for UTI Pessary was cleaned and replaced today. Instructions given for care. Concerning symptoms to observe for are counseled to patient. Follow up scheduled for 3  months.  Barnett Applebaum, MD, [facility], [facility] 12/12/2017  9:11 AM

## 2017-12-12 NOTE — Patient Instructions (Signed)
Sulfamethoxazole; Trimethoprim, SMX-TMP tablets What is this medicine? SULFAMETHOXAZOLE; TRIMETHOPRIM or SMX-TMP (suhl fuh meth OK suh zohl; trye METH oh prim) is a combination of a sulfonamide antibiotic and a second antibiotic, trimethoprim. It is used to treat or prevent certain kinds of bacterial infections. It will not work for colds, flu, or other viral infections. This medicine may be used for other purposes; ask your health care provider or pharmacist if you have questions. COMMON BRAND NAME(S): Bacter-Aid DS, Bactrim, Bactrim DS, Septra, Septra DS What should I tell my health care provider before I take this medicine? They need to know if you have any of these conditions: -anemia -asthma -being treated with anticonvulsants -if you frequently drink alcohol containing drinks -kidney disease -liver disease -low level of folic acid or glucose-6-phosphate dehydrogenase -poor nutrition or malabsorption -porphyria -severe allergies -thyroid disorder -an unusual or allergic reaction to sulfamethoxazole, trimethoprim, sulfa drugs, other medicines, foods, dyes, or preservatives -pregnant or trying to get pregnant -breast-feeding How should I use this medicine? Take this medicine by mouth with a full glass of water. Follow the directions on the prescription label. Take your medicine at regular intervals. Do not take it more often than directed. Do not skip doses or stop your medicine early. Talk to your pediatrician regarding the use of this medicine in children. Special care may be needed. This medicine has been used in children as young as 2 months of age. Overdosage: If you think you have taken too much of this medicine contact a poison control center or emergency room at once. NOTE: This medicine is only for you. Do not share this medicine with others. What if I miss a dose? If you miss a dose, take it as soon as you can. If it is almost time for your next dose, take only that dose. Do  not take double or extra doses. What may interact with this medicine? Do not take this medicine with any of the following medications: -aminobenzoate potassium -dofetilide -metronidazole This medicine may also interact with the following medications: -ACE inhibitors like benazepril, enalapril, lisinopril, and ramipril -birth control pills -cyclosporine -digoxin -diuretics -indomethacin -medicines for diabetes -methenamine -methotrexate -phenytoin -potassium supplements -pyrimethamine -sulfinpyrazone -tricyclic antidepressants -warfarin This list may not describe all possible interactions. Give your health care provider a list of all the medicines, herbs, non-prescription drugs, or dietary supplements you use. Also tell them if you smoke, drink alcohol, or use illegal drugs. Some items may interact with your medicine. What should I watch for while using this medicine? Tell your doctor or health care professional if your symptoms do not improve. Drink several glasses of water a day to reduce the risk of kidney problems. Do not treat diarrhea with over the counter products. Contact your doctor if you have diarrhea that lasts more than 2 days or if it is severe and watery. This medicine can make you more sensitive to the sun. Keep out of the sun. If you cannot avoid being in the sun, wear protective clothing and use a sunscreen. Do not use sun lamps or tanning beds/booths. What side effects may I notice from receiving this medicine? Side effects that you should report to your doctor or health care professional as soon as possible: -allergic reactions like skin rash or hives, swelling of the face, lips, or tongue -breathing problems -fever or chills, sore throat -irregular heartbeat, chest pain -joint or muscle pain -pain or difficulty passing urine -red pinpoint spots on skin -redness, blistering, peeling or loosening of   the skin, including inside the mouth -unusual bleeding or  bruising -unusually weak or tired -yellowing of the eyes or skin Side effects that usually do not require medical attention (report to your doctor or health care professional if they continue or are bothersome): -diarrhea -dizziness -headache -loss of appetite -nausea, vomiting -nervousness This list may not describe all possible side effects. Call your doctor for medical advice about side effects. You may report side effects to FDA at 1-800-FDA-1088. Where should I keep my medicine? Keep out of the reach of children. Store at room temperature between 20 to 25 degrees C (68 to 77 degrees F). Protect from light. Throw away any unused medicine after the expiration date. NOTE: This sheet is a summary. It may not cover all possible information. If you have questions about this medicine, talk to your doctor, pharmacist, or health care provider.  2018 Elsevier/Gold Standard (2013-04-12 14:38:26)  

## 2017-12-19 ENCOUNTER — Telehealth: Payer: Self-pay | Admitting: Gastroenterology

## 2017-12-19 ENCOUNTER — Inpatient Hospital Stay: Payer: Medicare Other | Attending: Oncology

## 2017-12-19 VITALS — BP 126/80 | HR 75 | Temp 96.0°F | Resp 20

## 2017-12-19 DIAGNOSIS — Z86000 Personal history of in-situ neoplasm of breast: Secondary | ICD-10-CM | POA: Insufficient documentation

## 2017-12-19 DIAGNOSIS — D509 Iron deficiency anemia, unspecified: Secondary | ICD-10-CM

## 2017-12-19 MED ORDER — IRON SUCROSE 20 MG/ML IV SOLN
INTRAVENOUS | Status: AC
  Filled 2017-12-19: qty 10

## 2017-12-19 MED ORDER — SODIUM CHLORIDE 0.9 % IV SOLN
Freq: Once | INTRAVENOUS | Status: AC
Start: 2017-12-19 — End: 2017-12-19
  Administered 2017-12-19: 11:00:00 via INTRAVENOUS
  Filled 2017-12-19: qty 1000

## 2017-12-19 MED ORDER — IRON SUCROSE 20 MG/ML IV SOLN
200.0000 mg | Freq: Once | INTRAVENOUS | Status: AC
  Administered 2017-12-19: 200 mg via INTRAVENOUS
  Filled 2017-12-19: qty 10

## 2017-12-19 NOTE — Progress Notes (Signed)
Patient reports that she has a repeat colonoscopy on this Friday.  Her GI doctor told her to stop taking her iron tablets prior to procedure. I verified with MD in house that she would be okay to receive her IV iron today and he said yes.

## 2017-12-19 NOTE — Telephone Encounter (Signed)
Returned call.  Advised Cyril Mourning that IV infusions don't interfere with colonoscopy prep.    Cyril Mourning from [facility]  leftg vm to see if pt is ok to have irion infusion she is scheduled for Procedure Friday and Cyril Mourning wants to make sure please call [phone]

## 2017-12-19 NOTE — Patient Instructions (Signed)
Iron Sucrose injection What is this medicine? IRON SUCROSE (AHY ern SOO krohs) is an iron complex. Iron is used to make healthy red blood cells, which carry oxygen and nutrients throughout the body. This medicine is used to treat iron deficiency anemia in people with chronic kidney disease. This medicine may be used for other purposes; ask your health care provider or pharmacist if you have questions. COMMON BRAND NAME(S): Venofer What should I tell my health care provider before I take this medicine? They need to know if you have any of these conditions: -anemia not caused by low iron levels -heart disease -high levels of iron in the blood -kidney disease -liver disease -an unusual or allergic reaction to iron, other medicines, foods, dyes, or preservatives -pregnant or trying to get pregnant -breast-feeding How should I use this medicine? This medicine is for infusion into a vein. It is given by a health care professional in a hospital or clinic setting. Talk to your pediatrician regarding the use of this medicine in children. While this drug may be prescribed for children as young as 2 years for selected conditions, precautions do apply. Overdosage: If you think you have taken too much of this medicine contact a poison control center or emergency room at once. NOTE: This medicine is only for you. Do not share this medicine with others. What if I miss a dose? It is important not to miss your dose. Call your doctor or health care professional if you are unable to keep an appointment. What may interact with this medicine? Do not take this medicine with any of the following medications: -deferoxamine -dimercaprol -other iron products This medicine may also interact with the following medications: -chloramphenicol -deferasirox This list may not describe all possible interactions. Give your health care provider a list of all the medicines, herbs, non-prescription drugs, or dietary  supplements you use. Also tell them if you smoke, drink alcohol, or use illegal drugs. Some items may interact with your medicine. What should I watch for while using this medicine? Visit your doctor or healthcare professional regularly. Tell your doctor or healthcare professional if your symptoms do not start to get better or if they get worse. You may need blood work done while you are taking this medicine. You may need to follow a special diet. Talk to your doctor. Foods that contain iron include: whole grains/cereals, dried fruits, beans, or peas, leafy green vegetables, and organ meats (liver, kidney). What side effects may I notice from receiving this medicine? Side effects that you should report to your doctor or health care professional as soon as possible: -allergic reactions like skin rash, itching or hives, swelling of the face, lips, or tongue -breathing problems -changes in blood pressure -cough -fast, irregular heartbeat -feeling faint or lightheaded, falls -fever or chills -flushing, sweating, or hot feelings -joint or muscle aches/pains -seizures -swelling of the ankles or feet -unusually weak or tired Side effects that usually do not require medical attention (report to your doctor or health care professional if they continue or are bothersome): -diarrhea -feeling achy -headache -irritation at site where injected -nausea, vomiting -stomach upset -tiredness This list may not describe all possible side effects. Call your doctor for medical advice about side effects. You may report side effects to FDA at 1-800-FDA-1088. Where should I keep my medicine? This drug is given in a hospital or clinic and will not be stored at home. NOTE: This sheet is a summary. It may not cover all possible information. If   you have questions about this medicine, talk to your doctor, pharmacist, or health care provider.  2018 Elsevier/Gold Standard (2011-06-16 17:14:35)  

## 2017-12-19 NOTE — Telephone Encounter (Signed)
Kristen from [facility]  leftg vm to see if pt is ok to have irion infusion she is scheduled for Procedure Friday and Cyril Mourning wants to make sure please call [phone]

## 2017-12-21 ENCOUNTER — Encounter: Payer: Self-pay | Admitting: *Deleted

## 2017-12-22 ENCOUNTER — Encounter
Admission: RE | Disposition: A | Discharge status: Home / Self Care | Payer: Self-pay | Source: Ambulatory Visit | Attending: Gastroenterology

## 2017-12-22 ENCOUNTER — Ambulatory Visit: Payer: Medicare Other | Admitting: Certified Registered Nurse Anesthetist

## 2017-12-22 ENCOUNTER — Ambulatory Visit
Admission: RE | Admit: 2017-12-22 | Discharge: 2017-12-22 | Disposition: A | Discharge status: Home / Self Care | Payer: Medicare Other | Source: Ambulatory Visit | Attending: Gastroenterology | Admitting: Gastroenterology

## 2017-12-22 DIAGNOSIS — I739 Peripheral vascular disease, unspecified: Secondary | ICD-10-CM | POA: Diagnosis not present

## 2017-12-22 DIAGNOSIS — K219 Gastro-esophageal reflux disease without esophagitis: Secondary | ICD-10-CM | POA: Diagnosis not present

## 2017-12-22 DIAGNOSIS — E7800 Pure hypercholesterolemia, unspecified: Secondary | ICD-10-CM | POA: Diagnosis not present

## 2017-12-22 DIAGNOSIS — K449 Diaphragmatic hernia without obstruction or gangrene: Secondary | ICD-10-CM | POA: Diagnosis not present

## 2017-12-22 DIAGNOSIS — D509 Iron deficiency anemia, unspecified: Secondary | ICD-10-CM

## 2017-12-22 DIAGNOSIS — Z923 Personal history of irradiation: Secondary | ICD-10-CM | POA: Insufficient documentation

## 2017-12-22 DIAGNOSIS — Z79899 Other long term (current) drug therapy: Secondary | ICD-10-CM | POA: Insufficient documentation

## 2017-12-22 DIAGNOSIS — K640 First degree hemorrhoids: Secondary | ICD-10-CM

## 2017-12-22 DIAGNOSIS — I10 Essential (primary) hypertension: Secondary | ICD-10-CM | POA: Diagnosis not present

## 2017-12-22 DIAGNOSIS — Z853 Personal history of malignant neoplasm of breast: Secondary | ICD-10-CM | POA: Insufficient documentation

## 2017-12-22 DIAGNOSIS — K5730 Diverticulosis of large intestine without perforation or abscess without bleeding: Secondary | ICD-10-CM

## 2017-12-22 HISTORY — PX: COLONOSCOPY WITH PROPOFOL: SHX5780

## 2017-12-22 HISTORY — PX: ESOPHAGOGASTRODUODENOSCOPY (EGD) WITH PROPOFOL: SHX5813

## 2017-12-22 SURGERY — COLONOSCOPY WITH PROPOFOL
Anesthesia: General

## 2017-12-22 MED ORDER — LIDOCAINE HCL (CARDIAC) 20 MG/ML IV SOLN
INTRAVENOUS | Status: DC | PRN
  Administered 2017-12-22: 100 mg via INTRAVENOUS

## 2017-12-22 MED ORDER — LIDOCAINE HCL (PF) 2 % IJ SOLN
INTRAMUSCULAR | Status: AC
  Filled 2017-12-22: qty 10

## 2017-12-22 MED ORDER — PROPOFOL 10 MG/ML IV BOLUS
INTRAVENOUS | Status: DC | PRN
  Administered 2017-12-22: 60 mg via INTRAVENOUS
  Administered 2017-12-22 (×2): 20 mg via INTRAVENOUS

## 2017-12-22 MED ORDER — SODIUM CHLORIDE 0.9 % IV SOLN
INTRAVENOUS | Status: DC
  Administered 2017-12-22: 09:00:00 via INTRAVENOUS

## 2017-12-22 MED ORDER — PROPOFOL 500 MG/50ML IV EMUL
INTRAVENOUS | Status: AC
  Filled 2017-12-22: qty 50

## 2017-12-22 MED ORDER — PROPOFOL 500 MG/50ML IV EMUL
INTRAVENOUS | Status: DC | PRN
  Administered 2017-12-22: 150 ug/kg/min via INTRAVENOUS

## 2017-12-22 NOTE — H&P (Signed)
Jonathon Bellows, MD [address], [place], [place], [place], [address] [address], [place], [place], [place], [address] Phone: [phone]  Fax: [phone]  Primary Care Physician:  Jerrol Banana., MD   Pre-Procedure History & Physical: HPI:  Claire Kane is a 77 y.o. female is here for an endoscopy and colonoscopy    Past Medical History:  Diagnosis Date  . Anemia   . Anxiety   . Arthritis   . 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 Cataract   . GERD (gastroesophageal reflux disease)   . Heart murmur 2001  . Hiatal hernia   . High cholesterol   . Hypertension   . Personal history of radiation therapy 2002   BREAST CA  . Rosacea   . 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 complications     Past Surgical History:  Procedure Laterality Date  . ABDOMINAL HYSTERECTOMY    . BREAST CYST ASPIRATION Left 2003  . BREAST EXCISIONAL BIOPSY Right 2002   POS  . BREAST SURGERY Right 2002   lumpectomy  . CHOLECYSTECTOMY    . COLONOSCOPY  2013  . COLONOSCOPY WITH PROPOFOL N/A 12/08/2015   Procedure: COLONOSCOPY WITH PROPOFOL;  Surgeon: Christene Lye, MD;  Location: [facility] ENDOSCOPY;  Service: Endoscopy;  Laterality: N/A;  . COLONOSCOPY WITH PROPOFOL N/A 12/06/2017   Procedure: COLONOSCOPY WITH PROPOFOL;  Surgeon: Jonathon Bellows, MD;  Location: [facility] ENDOSCOPY;  Service: Gastroenterology;  Laterality: N/A;  . ESOPHAGOGASTRODUODENOSCOPY (EGD) WITH PROPOFOL N/A 12/08/2015   Procedure: ESOPHAGOGASTRODUODENOSCOPY (EGD) WITH PROPOFOL;  Surgeon: Christene Lye, MD;  Location: [facility] ENDOSCOPY;  Service: Endoscopy;  Laterality: N/A;  . ESOPHAGOGASTRODUODENOSCOPY (EGD) WITH PROPOFOL N/A 12/06/2017   Procedure: ESOPHAGOGASTRODUODENOSCOPY (EGD) WITH PROPOFOL;  Surgeon: Jonathon Bellows, MD;  Location[facility] ENDOSCOPY;  Service: Gastroenterology;  Laterality: N/A;  . EYE SURGERY Right 2014  . post radiation therapy  Right    right breast cancer 2002  . Stab pheblectomy   2010  . vein closure procedure Bilateral 2008    Prior to Admission medications   Medication Sig Start Date End Date Taking? Authorizing Provider  atorvastatin (LIPITOR) 10 MG tablet Take 10 mg by mouth daily.    [provider]  calcium citrate (CALCITRATE - DOSED IN MG ELEMENTAL CALCIUM) 950 MG tablet Take 200 mg of elemental calcium by mouth 2 (two) times daily.     [provider]  docusate sodium (STOOL SOFTENER) 100 MG capsule Take 100 mg by mouth daily as needed for mild constipation.    [provider]  ezetimibe (ZETIA) 10 MG tablet Take 10 mg by mouth daily.    [provider]  Glucosamine 750 MG TABS Take by mouth daily.    [provider]  Multiple Vitamins-Minerals (MULTIVITAMIN WITH MINERALS) tablet Take 1 tablet by mouth daily.    [provider]  omeprazole (PRILOSEC) 20 MG capsule Take 1 capsule (20 mg total) by mouth daily. 01/09/17   Jerrol Banana., MD  ORACEA 40 MG capsule PRN 03/02/15   [provider]  polyethylene glycol powder (MIRALAX) powder Take 17 g by mouth daily as needed. 11/29/17 01/29/18  Jonathon Bellows, MD  psyllium (REGULOID) 0.52 G capsule Take 0.52 g by mouth daily.    [provider]  ranitidine (ZANTAC) 150 MG tablet Take 1 tablet (150 mg total) by mouth daily. 01/09/17  Jerrol Banana., MD  senna (SENOKOT) 8.6 MG tablet Take 1 tablet by mouth daily.    [provider]  SOOLANTRA 1 % CREA  06/30/16   [provider]  spironolactone (ALDACTONE) 50 MG tablet 50 mg 2 (two) times daily.  12/29/14   [provider]  sulfamethoxazole-trimethoprim (BACTRIM DS,SEPTRA DS) 800-160 MG tablet Take 1 tablet by mouth 2 (two) times daily. 12/12/17   Gae Dry, MD  traMADol (ULTRAM) 50 MG tablet Take 1 tablet (50 mg total) by mouth every 6 (six) hours as needed. 07/21/17   Mar Daring, PA-C    TURMERIC PO Take by mouth daily.    [provider]  [facility] 625 MG tablet Take 1 tablet by mouth 3 (three) times daily.  05/21/13   [provider]    Allergies as of 12/12/2017  . (No Known Allergies)    Family History  Problem Relation Age of Onset  . Heart attack Mother   . Heart attack Father   . Heart attack Sister   . Stroke Sister   . Heart attack Brother   . Other Brother        cerebral hemorrhage, sepsis  . Breast cancer Cousin 1       breast/maternal  . Cancer Cousin 57       breast/maternal  . Breast cancer Maternal Aunt 70  . Breast cancer Other 48       maternal neice    Social History   Socioeconomic History  . Marital status: Married    Spouse name: Not on file  . Number of children: Not on file  . Years of education: Not on file  . Highest education level: Not on file  Occupational History  . Not on file  Social Needs  . Financial resource strain: Not on file  . Food insecurity:    Worry: Not on file    Inability: Not on file  . Transportation needs:    Medical: Not on file    Non-medical: Not on file  Tobacco Use  . Smoking status: Never Smoker  . Smokeless tobacco: Never Used  Substance and Sexual Activity  . Alcohol use: No  . Drug use: No  . Sexual activity: Never  Lifestyle  . Physical activity:    Days per week: Not on file    Minutes per session: Not on file  . Stress: Not on file  Relationships  . Social connections:    Talks on phone: Not on file    Gets together: Not on file    Attends religious service: Not on file    Active member of club or organization: Not on file    Attends meetings of clubs or organizations: Not on file    Relationship status: Not on file  . Intimate partner violence:    Fear of current or ex partner: Not on file    Emotionally abused: Not on file    Physically abused: Not on file    Forced sexual activity: Not on file  Other Topics Concern  . Not on file  Social History  Narrative  . Not on file    Review of Systems: See HPI, otherwise negative ROS  Physical Exam: There were no vitals taken for this visit. General:   Alert,  pleasant and cooperative in NAD Head:  Normocephalic and atraumatic. Neck:  Supple; no masses or thyromegaly. Lungs:  Clear throughout to auscultation, normal respiratory effort.  Heart:  +S1, +S2, Regular rate and rhythm, No edema. Abdomen:  Soft, nontender and nondistended. Normal bowel sounds, without guarding, and without rebound.   Neurologic:  Alert and  oriented x4;  grossly normal neurologically.  Impression/Plan: Claire Kane is here for an endoscopy and colonoscopy  to be performed for  evaluation of iron deficiency anemia     Risks, benefits, limitations, and alternatives regarding endoscopy have been reviewed with the patient.  Questions have been answered.  All parties agreeable.   Jonathon Bellows, MD  12/22/2017, 8:44 AM

## 2017-12-22 NOTE — Anesthesia Post-op Follow-up Note (Signed)
Anesthesia QCDR form completed.        

## 2017-12-22 NOTE — Anesthesia Procedure Notes (Signed)
Performed by: Deston Bilyeu, CRNA Pre-anesthesia Checklist: Patient identified, Emergency Drugs available, Suction available, Patient being monitored and Timeout performed Patient Re-evaluated:Patient Re-evaluated prior to induction Oxygen Delivery Method: Nasal cannula Induction Type: IV induction       

## 2017-12-22 NOTE — Transfer of Care (Signed)
Immediate Anesthesia Transfer of Care Note  Patient: Claire Kane  Procedure(s) Performed: COLONOSCOPY WITH PROPOFOL (N/A ) ESOPHAGOGASTRODUODENOSCOPY (EGD) WITH PROPOFOL (N/A )  Patient Location: PACU  Anesthesia Type:General  Level of Consciousness: awake, alert  and oriented  Airway & Oxygen Therapy: Patient Spontanous Breathing and Patient connected to nasal cannula oxygen  Post-op Assessment: Report given to RN and Post -op Vital signs reviewed and stable  Post vital signs: Reviewed and stable  Last Vitals:  Vitals Value Taken Time  BP 144/60 12/22/2017 10:29 AM  Temp    Pulse 85 12/22/2017 10:32 AM  Resp 28 12/22/2017 10:32 AM  SpO2 99 % 12/22/2017 10:32 AM  Vitals shown include unvalidated device data.  Last Pain:  Vitals:   12/22/17 1028  TempSrc:   PainSc: 0-No pain         Complications: No apparent anesthesia complications

## 2017-12-22 NOTE — Op Note (Signed)
[facility] Patient Name: Claire Kane Procedure Date: 12/22/2017 9:25 AM MRN: [id number] Account #: [id number] Date of Birth: 1941/01/22 Admit Type: Outpatient Age: 77 Room: [facility] ENDO ROOM 4 Gender: Female Note Status: Finalized Procedure:            Upper GI endoscopy Indications:          Iron deficiency anemia Providers:            Jonathon Bellows MD, MD Medicines:            Monitored Anesthesia Care Complications:        No immediate complications. Procedure:            Pre-Anesthesia Assessment:                       - Prior to the procedure, a History and Physical was                        performed, and patient medications, allergies and                        sensitivities were reviewed. The patient's tolerance of                        previous anesthesia was reviewed.                       - The risks and benefits of the procedure and the                        sedation options and risks were discussed with the                        patient. All questions were answered and informed                        consent was obtained.                       - ASA Grade Assessment: III - A patient with severe                        systemic disease.                       After obtaining informed consent, the endoscope was                        passed under direct vision. Throughout the procedure,                        the patient's blood pressure, pulse, and oxygen                        saturations were monitored continuously. The Endoscope                        was introduced through the mouth, and advanced to the                        third part of duodenum. The upper GI endoscopy was  accomplished with ease. The patient tolerated the                        procedure well. Findings:      The esophagus was normal.      An 8 cm hiatal hernia was present.      The examined duodenum was normal. Biopsies for histology were  taken with       a cold forceps for evaluation of celiac disease.      The cardia and gastric fundus were normal on retroflexion. Impression:           - Normal esophagus.                       - 8 cm hiatal hernia.                       - Normal examined duodenum. Biopsied. Recommendation:       - Await pathology results.                       - Perform a colonoscopy today. Procedure Code(s):    --- Professional ---                       [phone], Esophagogastroduodenoscopy, flexible, transoral;                        with biopsy, single or multiple Diagnosis Code(s):    --- Professional ---                       K44.9, Diaphragmatic hernia without obstruction or                        gangrene                       D50.9, Iron deficiency anemia, unspecified CPT copyright 2017 American Medical Association. All rights reserved. The codes documented in this report are preliminary and upon coder review may  be revised to meet current compliance requirements. Jonathon Bellows, MD Jonathon Bellows MD, MD 12/22/2017 9:45:59 AM This report has been signed electronically. Number of Addenda: 0 Note Initiated On: 12/22/2017 9:25 AM      [facility]

## 2017-12-22 NOTE — Op Note (Signed)
[facility] [place] Patient Name: Claire Kane Procedure Date: 12/22/2017 9:25 AM MRN: [id number] Account #: [id number] Date of Birth: July 14, 1941 Admit Type: Outpatient Age: 77 Room: [facility] ENDO ROOM 4 Gender: Female Note Status: Finalized Procedure:            Colonoscopy Indications:          Iron deficiency anemia Providers:            Jonathon Bellows MD, MD Medicines:            Monitored Anesthesia Care Complications:        No immediate complications. Procedure:            Pre-Anesthesia Assessment:                       - Prior to the procedure, a History and Physical was                        performed, and patient medications, allergies and                        sensitivities were reviewed. The patient's tolerance of                        previous anesthesia was reviewed.                       - The risks and benefits of the procedure and the                        sedation options and risks were discussed with the                        patient. All questions were answered and informed                        consent was obtained.                       - ASA Grade Assessment: III - A patient with severe                        systemic disease.                       After obtaining informed consent, the colonoscope was                        passed under direct vision. Throughout the procedure,                        the patient's blood pressure, pulse, and oxygen                        saturations were monitored continuously. The                        Colonoscope was introduced through the anus and                        advanced to the the cecum, identified by the  appendiceal orifice, IC valve and transillumination.                        The colonoscopy was performed with ease. The patient                        tolerated the procedure well. The quality of the bowel                        preparation was good. Findings:   The perianal and digital rectal examinations were normal.      Non-bleeding internal hemorrhoids were found during retroflexion. The       hemorrhoids were medium-sized and Grade I (internal hemorrhoids that do       not prolapse).      Multiple small-mouthed diverticula were found in the sigmoid colon.      The exam was otherwise without abnormality on direct and retroflexion       views. Impression:           - Non-bleeding internal hemorrhoids.                       - Diverticulosis in the sigmoid colon.                       - The examination was otherwise normal on direct and                        retroflexion views.                       - No specimens collected. Recommendation:       - Discharge patient to home (with escort).                       - Resume previous diet.                       - Continue present medications.                       - To visualize the small bowel, perform video capsule                        endoscopy in 2 weeks. Procedure Code(s):    --- Professional ---                       [phone], Colonoscopy, flexible; diagnostic, including                        collection of specimen(s) by brushing or washing, when                        performed (separate procedure) Diagnosis Code(s):    --- Professional ---                       K64.0, First degree hemorrhoids                       D50.9, Iron deficiency anemia, unspecified  K57.30, Diverticulosis of large intestine without                        perforation or abscess without bleeding CPT copyright 2017 American Medical Association. All rights reserved. The codes documented in this report are preliminary and upon coder review may  be revised to meet current compliance requirements. Jonathon Bellows, MD Jonathon Bellows MD, MD 12/22/2017 10:05:34 AM This report has been signed electronically. Number of Addenda: 0 Note Initiated On: 12/22/2017 9:25 AM Scope Withdrawal Time: 0 hours 11 minutes 18  seconds  Total Procedure Duration: 0 hours 16 minutes 1 second       [facility]

## 2017-12-22 NOTE — Anesthesia Preprocedure Evaluation (Signed)
Anesthesia Evaluation  Patient identified by MRN, date of birth, ID band Patient awake    Reviewed: Allergy & Precautions, H&P , NPO status , Patient's Chart, lab work & pertinent test results, reviewed documented beta blocker date and time   Airway Mallampati: II   Neck ROM: full    Dental  (+) Poor Dentition, Teeth Intact   Pulmonary neg pulmonary ROS, shortness of breath and with exertion,    Pulmonary exam normal        Cardiovascular hypertension, + Peripheral Vascular Disease  negative cardio ROS Normal cardiovascular exam+ Valvular Problems/Murmurs  Rhythm:regular Rate:Normal     Neuro/Psych PSYCHIATRIC DISORDERS Anxiety Depression  Neuromuscular disease negative neurological ROS  negative psych ROS   GI/Hepatic negative GI ROS, Neg liver ROS, hiatal hernia, GERD  ,  Endo/Other  negative endocrine ROS  Renal/GU negative Renal ROS  negative genitourinary   Musculoskeletal   Abdominal   Peds  Hematology negative hematology ROS (+) anemia ,   Anesthesia Other Findings Past Medical History: No date: Anemia No date: Anxiety No date: Arthritis 08/04/2015: BRCA negative     Comment:  BRCA1 and BRCA2 2002: 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  No date: Cataract No date: GERD (gastroesophageal reflux disease) 2001: Heart murmur No date: Hiatal hernia No date: High cholesterol No date: Hypertension 2002: Personal history of radiation therapy     Comment:  BREAST CA No date: Rosacea No date: 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 complications Past Surgical History: No date: ABDOMINAL HYSTERECTOMY 2003: BREAST CYST ASPIRATION; Left 2002: BREAST EXCISIONAL BIOPSY; Right     Comment:  POS 2002: BREAST SURGERY; Right     Comment:  lumpectomy No date: CHOLECYSTECTOMY 2013: COLONOSCOPY 12/08/2015: COLONOSCOPY WITH PROPOFOL; N/A     Comment:  Procedure: COLONOSCOPY WITH PROPOFOL;   Surgeon:               Christene Lye, MD;  Location: [facility] ENDOSCOPY;                Service: Endoscopy;  Laterality: N/A; 12/06/2017: COLONOSCOPY WITH PROPOFOL; N/A     Comment:  Procedure: COLONOSCOPY WITH PROPOFOL;  Surgeon: Jonathon Bellows, MD;  Location: [facility] ENDOSCOPY;  Service:               Gastroenterology;  Laterality: N/A; 12/08/2015: ESOPHAGOGASTRODUODENOSCOPY (EGD) WITH PROPOFOL; N/A     Comment:  Procedure: ESOPHAGOGASTRODUODENOSCOPY (EGD) WITH               PROPOFOL;  Surgeon: Christene Lye, MD;  Location:              [facility] ENDOSCOPY;  Service: Endoscopy;  Laterality: N/A; 12/06/2017: ESOPHAGOGASTRODUODENOSCOPY (EGD) WITH PROPOFOL; N/A     Comment:  Procedure: ESOPHAGOGASTRODUODENOSCOPY (EGD) WITH               PROPOFOL;  Surgeon: Jonathon Bellows, MD;  Location: [facility]               ENDOSCOPY;  Service: Gastroenterology;  Laterality: N/A; 2014: EYE SURGERY; Right No date: post radiation therapy; Right     Comment:  right breast cancer 2002 2010: Stab pheblectomy  2008: vein closure procedure; Bilateral BMI    Body Mass Index:  31.76 kg/m     Reproductive/Obstetrics negative OB ROS  Anesthesia Physical Anesthesia Plan  ASA: III  Anesthesia Plan: General   Post-op Pain Management:    Induction:   PONV Risk Score and Plan:   Airway Management Planned:   Additional Equipment:   Intra-op Plan:   Post-operative Plan:   Informed Consent: I have reviewed the patients History and Physical, chart, labs and discussed the procedure including the risks, benefits and alternatives for the proposed anesthesia with the patient or authorized representative who has indicated his/her understanding and acceptance.   Dental Advisory Given  Plan Discussed with: CRNA  Anesthesia Plan Comments:         Anesthesia Quick Evaluation

## 2017-12-25 ENCOUNTER — Encounter: Payer: Self-pay | Admitting: Gastroenterology

## 2017-12-25 LAB — SURGICAL PATHOLOGY

## 2017-12-26 ENCOUNTER — Inpatient Hospital Stay: Payer: Medicare Other

## 2017-12-26 VITALS — BP 127/81 | HR 75 | Temp 96.8°F | Resp 18

## 2017-12-26 DIAGNOSIS — D509 Iron deficiency anemia, unspecified: Secondary | ICD-10-CM

## 2017-12-26 MED ORDER — IRON SUCROSE 20 MG/ML IV SOLN
INTRAVENOUS | Status: AC
  Filled 2017-12-26: qty 10

## 2017-12-26 MED ORDER — SODIUM CHLORIDE 0.9 % IV SOLN
Freq: Once | INTRAVENOUS | Status: AC
  Administered 2017-12-26: 10:00:00 via INTRAVENOUS
  Filled 2017-12-26: qty 1000

## 2017-12-26 MED ORDER — IRON SUCROSE 20 MG/ML IV SOLN
200.0000 mg | Freq: Once | INTRAVENOUS | Status: AC
  Administered 2017-12-26: 200 mg via INTRAVENOUS
  Filled 2017-12-26: qty 10

## 2017-12-26 NOTE — Patient Instructions (Signed)

## 2017-12-28 ENCOUNTER — Encounter: Payer: Self-pay | Admitting: Gastroenterology

## 2018-01-02 ENCOUNTER — Inpatient Hospital Stay: Payer: Medicare Other

## 2018-01-02 VITALS — BP 119/72 | HR 70 | Temp 97.0°F | Resp 18

## 2018-01-02 DIAGNOSIS — D509 Iron deficiency anemia, unspecified: Secondary | ICD-10-CM | POA: Diagnosis not present

## 2018-01-02 MED ORDER — IRON SUCROSE 20 MG/ML IV SOLN
200.0000 mg | Freq: Once | INTRAVENOUS | Status: AC
  Administered 2018-01-02: 200 mg via INTRAVENOUS
  Filled 2018-01-02: qty 10

## 2018-01-02 MED ORDER — SODIUM CHLORIDE 0.9 % IV SOLN
Freq: Once | INTRAVENOUS | Status: AC
  Administered 2018-01-02: 10:00:00 via INTRAVENOUS
  Filled 2018-01-02: qty 1000

## 2018-01-02 NOTE — Patient Instructions (Signed)
Iron Sucrose injection What is this medicine? IRON SUCROSE (AHY ern SOO krohs) is an iron complex. Iron is used to make healthy red blood cells, which carry oxygen and nutrients throughout the body. This medicine is used to treat iron deficiency anemia in people with chronic kidney disease. This medicine may be used for other purposes; ask your health care provider or pharmacist if you have questions. COMMON BRAND NAME(S): Venofer What should I tell my health care provider before I take this medicine? They need to know if you have any of these conditions: -anemia not caused by low iron levels -heart disease -high levels of iron in the blood -kidney disease -liver disease -an unusual or allergic reaction to iron, other medicines, foods, dyes, or preservatives -pregnant or trying to get pregnant -breast-feeding How should I use this medicine? This medicine is for infusion into a vein. It is given by a health care professional in a hospital or clinic setting. Talk to your pediatrician regarding the use of this medicine in children. While this drug may be prescribed for children as young as 2 years for selected conditions, precautions do apply. Overdosage: If you think you have taken too much of this medicine contact a poison control center or emergency room at once. NOTE: This medicine is only for you. Do not share this medicine with others. What if I miss a dose? It is important not to miss your dose. Call your doctor or health care professional if you are unable to keep an appointment. What may interact with this medicine? Do not take this medicine with any of the following medications: -deferoxamine -dimercaprol -other iron products This medicine may also interact with the following medications: -chloramphenicol -deferasirox This list may not describe all possible interactions. Give your health care provider a list of all the medicines, herbs, non-prescription drugs, or dietary  supplements you use. Also tell them if you smoke, drink alcohol, or use illegal drugs. Some items may interact with your medicine. What should I watch for while using this medicine? Visit your doctor or healthcare professional regularly. Tell your doctor or healthcare professional if your symptoms do not start to get better or if they get worse. You may need blood work done while you are taking this medicine. You may need to follow a special diet. Talk to your doctor. Foods that contain iron include: whole grains/cereals, dried fruits, beans, or peas, leafy green vegetables, and organ meats (liver, kidney). What side effects may I notice from receiving this medicine? Side effects that you should report to your doctor or health care professional as soon as possible: -allergic reactions like skin rash, itching or hives, swelling of the face, lips, or tongue -breathing problems -changes in blood pressure -cough -fast, irregular heartbeat -feeling faint or lightheaded, falls -fever or chills -flushing, sweating, or hot feelings -joint or muscle aches/pains -seizures -swelling of the ankles or feet -unusually weak or tired Side effects that usually do not require medical attention (report to your doctor or health care professional if they continue or are bothersome): -diarrhea -feeling achy -headache -irritation at site where injected -nausea, vomiting -stomach upset -tiredness This list may not describe all possible side effects. Call your doctor for medical advice about side effects. You may report side effects to FDA at 1-800-FDA-1088. Where should I keep my medicine? This drug is given in a hospital or clinic and will not be stored at home. NOTE: This sheet is a summary. It may not cover all possible information. If   you have questions about this medicine, talk to your doctor, pharmacist, or health care provider.  2018 Elsevier/Gold Standard (2011-06-16 17:14:35)  

## 2018-01-04 NOTE — Anesthesia Postprocedure Evaluation (Signed)
Anesthesia Post Note  Patient: Claire Kane  Procedure(s) Performed: COLONOSCOPY WITH PROPOFOL (N/A ) ESOPHAGOGASTRODUODENOSCOPY (EGD) WITH PROPOFOL (N/A )  Patient location during evaluation: PACU Anesthesia Type: General Level of consciousness: awake and alert Pain management: pain level controlled Vital Signs Assessment: post-procedure vital signs reviewed and stable Respiratory status: spontaneous breathing, nonlabored ventilation, respiratory function stable and patient connected to nasal cannula oxygen Cardiovascular status: blood pressure returned to baseline and stable Postop Assessment: no apparent nausea or vomiting Anesthetic complications: no     Last Vitals:  Vitals:   12/22/17 1028 12/22/17 1038  BP: (!) 144/60 (!) 159/60  Pulse:    Resp:    Temp:    SpO2:      Last Pain:  Vitals:   12/24/17 1213  TempSrc:   PainSc: 0-No pain                 Molli Barrows

## 2018-01-08 ENCOUNTER — Inpatient Hospital Stay: Payer: Medicare Other

## 2018-01-08 DIAGNOSIS — D509 Iron deficiency anemia, unspecified: Secondary | ICD-10-CM | POA: Diagnosis not present

## 2018-01-08 LAB — CBC WITH DIFFERENTIAL/PLATELET
Basophils Absolute: 0.1 10*3/uL (ref 0–0.1)
Basophils Relative: 1 %
EOS PCT: 3 %
Eosinophils Absolute: 0.2 10*3/uL (ref 0–0.7)
HEMATOCRIT: 35.6 % (ref 35.0–47.0)
Hemoglobin: 11.6 g/dL — ABNORMAL LOW (ref 12.0–16.0)
LYMPHS ABS: 1.8 10*3/uL (ref 1.0–3.6)
Lymphocytes Relative: 25 %
MCH: 25.5 pg — AB (ref 26.0–34.0)
MCHC: 32.6 g/dL (ref 32.0–36.0)
MCV: 78.1 fL — AB (ref 80.0–100.0)
Monocytes Absolute: 0.7 10*3/uL (ref 0.2–0.9)
Monocytes Relative: 10 %
NEUTROS ABS: 4.4 10*3/uL (ref 1.4–6.5)
Neutrophils Relative %: 61 %
PLATELETS: 312 10*3/uL (ref 150–440)
RBC: 4.56 MIL/uL (ref 3.80–5.20)
RDW: 27.5 % — ABNORMAL HIGH (ref 11.5–14.5)
WBC: 7.2 10*3/uL (ref 3.6–11.0)

## 2018-01-08 LAB — IRON AND TIBC
IRON: 56 ug/dL (ref 28–170)
Saturation Ratios: 18 % (ref 10.4–31.8)
TIBC: 320 ug/dL (ref 250–450)
UIBC: 264 ug/dL

## 2018-01-08 LAB — FERRITIN: FERRITIN: 90 ng/mL (ref 11–307)

## 2018-01-09 ENCOUNTER — Inpatient Hospital Stay: Payer: Medicare Other

## 2018-01-09 ENCOUNTER — Encounter: Payer: Self-pay | Admitting: Oncology

## 2018-01-09 ENCOUNTER — Inpatient Hospital Stay (HOSPITAL_BASED_OUTPATIENT_CLINIC_OR_DEPARTMENT_OTHER): Payer: Medicare Other | Admitting: Oncology

## 2018-01-09 ENCOUNTER — Other Ambulatory Visit: Payer: Self-pay

## 2018-01-09 VITALS — BP 135/82 | HR 66 | Temp 97.1°F | Resp 18

## 2018-01-09 VITALS — BP 137/77 | HR 78 | Temp 98.3°F | Wt 190.6 lb

## 2018-01-09 DIAGNOSIS — D509 Iron deficiency anemia, unspecified: Secondary | ICD-10-CM

## 2018-01-09 DIAGNOSIS — Z86000 Personal history of in-situ neoplasm of breast: Secondary | ICD-10-CM

## 2018-01-09 MED ORDER — SODIUM CHLORIDE 0.9 % IV SOLN
INTRAVENOUS | Status: DC
  Administered 2018-01-09: 12:00:00 via INTRAVENOUS
  Filled 2018-01-09: qty 1000

## 2018-01-09 MED ORDER — IRON SUCROSE 20 MG/ML IV SOLN
200.0000 mg | INTRAVENOUS | Status: DC
  Administered 2018-01-09: 200 mg via INTRAVENOUS
  Filled 2018-01-09: qty 10

## 2018-01-09 NOTE — Progress Notes (Signed)
Patient here today for follow up.  Patient states no new concerns today  

## 2018-01-09 NOTE — Progress Notes (Signed)
Hematology/Oncology follow up  note [facility] [phone] [phone]   Patient Care Team: Jerrol Banana., MD as PCP - General (Family Medicine) Christene Lye, MD (General Surgery) Estill Cotta, MD as Consulting Physician (Ophthalmology) Gae Dry, MD as Referring Physician (Obstetrics and Gynecology)  REFERRING PROVIDER: Nilsa Nutting REASON FOR VISIT Evaluation of anemia  HISTORY OF PRESENTING ILLNESS:  Claire Kane is a  77 y.o.  female with PMH listed below who was referred to me for evaluation of anemia.  Patient reports Claire Kane was told that Claire Kane had iron deficiency 2 years ago and Claire Kane took iron supplementation for a while and was told to stop. Recently found to to have worsening of anemia. Claire Kane had Upper and lower endoscopy in 2017 by Dr.Sankar # Upper endoscopy 12/07/2017 Normal examined duodenum. - Large hiatal hernia. - Erythematous mucosa in the antrum. Biopsied. - The examination was otherwise normal # Colonoscopy on 12/07/2017  Non-thrombosed external hemorrhoids found on perianal exam. - Diverticulosis in the sigmoid colon and in the descending colon. - The examination was otherwise normal on direct and retroflexion views. - No specimens collected.  # Remote history of DCIS right breast in  2002, s/p surgery and RT. Finished 5 years of tamoxifen.   Interval History Patient presents for follow up of anemia treatment.  Recently s/p endoscopy which did not reveal active bleeding source. Plan -caspsule study. Celiac panel positive.  Feels that fatigue has somewhat improved however not back to baseline yet. Denies SOB, chest pain, abdominal pain. Claire Kane reports a recent UTI episode and today feels similar symptoms.   Review of Systems  Constitutional: Positive for malaise/fatigue. Negative for chills, diaphoresis, fever and weight loss.  HENT: Negative for ear discharge and nosebleeds.   Eyes: Negative  for double vision, photophobia and discharge.  Respiratory: Negative for cough, hemoptysis, sputum production and shortness of breath.   Cardiovascular: Negative for chest pain, orthopnea and claudication.  Gastrointestinal: Positive for blood in stool and heartburn. Negative for abdominal pain, constipation, nausea and vomiting.  Genitourinary: Negative for dysuria and urgency.  Musculoskeletal: Negative for back pain, myalgias and neck pain.  Skin: Negative for rash.  Neurological: Negative for dizziness, tingling, tremors and sensory change.  Endo/Heme/Allergies: Does not bruise/bleed easily.  Psychiatric/Behavioral: Negative for depression, substance abuse and suicidal ideas.    MEDICAL HISTORY:  Past Medical History:  Diagnosis Date  . Anemia   . Anxiety   . Arthritis   . 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 Breast cancer ([place]) 2002   DCIS right breast   . Cataract   . GERD (gastroesophageal reflux disease)   . Heart murmur 2001  . Hiatal hernia   . High cholesterol   . Hypertension   . Personal history of radiation therapy 2002   BREAST CA  . Rosacea   . 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 complications     SURGICAL HISTORY: Past Surgical History:  Procedure Laterality Date  . ABDOMINAL HYSTERECTOMY    . BREAST CYST ASPIRATION Left 2003  . BREAST EXCISIONAL BIOPSY Right 2002   POS  . BREAST SURGERY Right 2002   lumpectomy  . CHOLECYSTECTOMY    . COLONOSCOPY  2013  . COLONOSCOPY WITH PROPOFOL N/A 12/08/2015   Procedure: COLONOSCOPY WITH PROPOFOL;  Surgeon: Christene Lye, MD;  Location: [facility] ENDOSCOPY;  Service: Endoscopy;  Laterality: N/A;  . COLONOSCOPY WITH PROPOFOL N/A 12/06/2017   Procedure: COLONOSCOPY WITH  PROPOFOL;  Surgeon: Jonathon Bellows, MD;  Location: [facility] ENDOSCOPY;  Service: Gastroenterology;  Laterality: N/A;  . COLONOSCOPY WITH PROPOFOL N/A 12/22/2017   Procedure: COLONOSCOPY WITH PROPOFOL;  Surgeon: Jonathon Bellows, MD;   Location: [facility] ENDOSCOPY;  Service: Gastroenterology;  Laterality: N/A;  . ESOPHAGOGASTRODUODENOSCOPY (EGD) WITH PROPOFOL N/A 12/08/2015   Procedure: ESOPHAGOGASTRODUODENOSCOPY (EGD) WITH PROPOFOL;  Surgeon: Christene Lye, MD;  Location: [facility] ENDOSCOPY;  Service: Endoscopy;  Laterality: N/A;  . ESOPHAGOGASTRODUODENOSCOPY (EGD) WITH PROPOFOL N/A 12/06/2017   Procedure: ESOPHAGOGASTRODUODENOSCOPY (EGD) WITH PROPOFOL;  Surgeon: Jonathon Bellows, MD;  Location: [facility] ENDOSCOPY;  Service: Gastroenterology;  Laterality: N/A;  . ESOPHAGOGASTRODUODENOSCOPY (EGD) WITH PROPOFOL N/A 12/22/2017   Procedure: ESOPHAGOGASTRODUODENOSCOPY (EGD) WITH PROPOFOL;  Surgeon: Jonathon Bellows, MD;  Location: [facility] ENDOSCOPY;  Service: Gastroenterology;  Laterality: N/A;  . EYE SURGERY Right 2014  . post radiation therapy Right    right breast cancer 2002  . Stab pheblectomy   2010  . vein closure procedure Bilateral 2008    SOCIAL HISTORY: Social History   Socioeconomic History  . Marital status: Married    Spouse name: Not on file  . Number of children: Not on file  . Years of education: Not on file  . Highest education level: Not on file  Occupational History  . Not on file  Social Needs  . Financial resource strain: Not on file  . Food insecurity:    Worry: Not on file    Inability: Not on file  . Transportation needs:    Medical: Not on file    Non-medical: Not on file  Tobacco Use  . Smoking status: Never Smoker  . Smokeless tobacco: Never Used  Substance and Sexual Activity  . Alcohol use: No  . Drug use: No  . Sexual activity: Never  Lifestyle  . Physical activity:    Days per week: Not on file    Minutes per session: Not on file  . Stress: Not on file  Relationships  . Social connections:    Talks on phone: Not on file    Gets together: Not on file    Attends religious service: Not on file    Active member of club or organization: Not on file    Attends meetings of clubs or organizations:  Not on file    Relationship status: Not on file  . Intimate partner violence:    Fear of current or ex partner: Not on file    Emotionally abused: Not on file    Physically abused: Not on file    Forced sexual activity: Not on file  Other Topics Concern  . Not on file  Social History Narrative  . Not on file    FAMILY HISTORY: Family History  Problem Relation Age of Onset  . Heart attack Mother   . Heart attack Father   . Heart attack Sister   . Stroke Sister   . Heart attack Brother   . Other Brother        cerebral hemorrhage, sepsis  . Breast cancer Cousin 56       breast/maternal  . Cancer Cousin 91       breast/maternal  . Breast cancer Maternal Aunt 70  . Breast cancer Other 48       maternal neice    ALLERGIES:  has No Known Allergies.  MEDICATIONS:  Current Outpatient Medications  Medication Sig Dispense Refill  . atorvastatin (LIPITOR) 10 MG tablet Take 10 mg by mouth daily.    Marland Kitchen  calcium citrate (CALCITRATE - DOSED IN MG ELEMENTAL CALCIUM) 950 MG tablet Take 200 mg of elemental calcium by mouth 2 (two) times daily.     Marland Kitchen docusate sodium (STOOL SOFTENER) 100 MG capsule Take 100 mg by mouth daily as needed for mild constipation.    Marland Kitchen ezetimibe (ZETIA) 10 MG tablet Take 10 mg by mouth daily.    . Glucosamine 750 MG TABS Take by mouth daily.    . Multiple Vitamins-Minerals (MULTIVITAMIN WITH MINERALS) tablet Take 1 tablet by mouth daily.    Marland Kitchen omeprazole (PRILOSEC) 20 MG capsule Take 1 capsule (20 mg total) by mouth daily. 90 capsule 3  . ORACEA 40 MG capsule PRN    . polyethylene glycol powder (MIRALAX) powder Take 17 g by mouth daily as needed. 255 g 0  . psyllium (REGULOID) 0.52 G capsule Take 0.52 g by mouth daily.    . ranitidine (ZANTAC) 150 MG tablet Take 1 tablet (150 mg total) by mouth daily. 90 tablet 3  . senna (SENOKOT) 8.6 MG tablet Take 1 tablet by mouth daily.    . SOOLANTRA 1 % CREA     . spironolactone (ALDACTONE) 50 MG tablet 50 mg 2 (two) times  daily.     Marland Kitchen sulfamethoxazole-trimethoprim (BACTRIM DS,SEPTRA DS) 800-160 MG tablet Take 1 tablet by mouth 2 (two) times daily. (Patient not taking: Reported on 12/22/2017) 6 tablet 0  . traMADol (ULTRAM) 50 MG tablet Take 1 tablet (50 mg total) by mouth every 6 (six) hours as needed. 100 tablet 5  . TURMERIC PO Take by mouth daily.    [name] 625 MG tablet Take 1 tablet by mouth 3 (three) times daily.      No current facility-administered medications for this visit.      PHYSICAL EXAMINATION: ECOG PERFORMANCE STATUS: 0 - Asymptomatic Vitals:   01/09/18 1107  BP: 137/77  Pulse: 78  Temp: 98.3 F (36.8 C)   Filed Weights   01/09/18 1107  Weight: 190 lb 9 oz (86.4 kg)    Physical Exam  Constitutional: Claire Kane is oriented to person, place, and time and well-developed, well-nourished, and in no distress. No distress.  HENT:  Head: Normocephalic and atraumatic.  Nose: Nose normal.  Mouth/Throat: Oropharynx is clear and moist. No oropharyngeal exudate.  Eyes: Pupils are equal, round, and reactive to light. Conjunctivae and EOM are normal. Right eye exhibits no discharge. No scleral icterus.  Neck: Normal range of motion. Neck supple.  Cardiovascular: Normal rate, regular rhythm and normal heart sounds.  No murmur heard. Pulmonary/Chest: Effort normal and breath sounds normal. No respiratory distress. Claire Kane has no wheezes. Claire Kane has no rales.  Abdominal: Soft. Bowel sounds are normal. Claire Kane exhibits no mass. There is no guarding.  Musculoskeletal: Normal range of motion. Claire Kane exhibits no edema.  Lymphadenopathy:    Claire Kane has no cervical adenopathy.  Neurological: Claire Kane is alert and oriented to person, place, and time. No cranial nerve deficit. GCS score is 15.  Skin: Skin is warm and dry. No erythema.  Psychiatric: Memory, affect and judgment normal.     LABORATORY DATA:  I have reviewed the data as listed Lab Results  Component Value Date   WBC 7.2 01/08/2018   HGB 11.6 (L) 01/08/2018    HCT 35.6 01/08/2018   MCV 78.1 (L) 01/08/2018   PLT 312 01/08/2018   Recent Labs    10/20/17 0919 11/14/17 1352  NA 146* 141  K 4.2 4.2  CL 107* 104  CO2 21  20  GLUCOSE 97 142*  BUN 10 9  CREATININE 0.86 0.82  CALCIUM 9.0 9.1  GFRNONAA 66 70  GFRAA 76 80  PROT 6.9 6.9  ALBUMIN 4.6 4.3  AST 14 17  ALT 12 14  ALKPHOS 78 75  BILITOT 0.8 0.6       ASSESSMENT & PLAN:  Patient is a 77 year old female present for evaluation of iron deficiency anemia. 1. Iron deficiency anemia, unspecified iron deficiency anemia type   2. Microcytic anemia   3. History of ductal carcinoma in situ (DCIS) of breast   s/p IV iron with Venofer 215m weekly x 4. Hemoglobin and ferritin improved. Still have mild anemia, and ferritin is <100.  Possible ongoing blood loss, to complete capsule study.  Claire Kane still have room to further improve her iron store and hemoglobin, will give one more dose of Venofer today.  Follow up with GI.   # Family history of breast cancer, personal history of DCIS. Patient reports that Dr.Sankar has sent some genetic testing in the past and Claire Kane does not have " breast cancer gene" Reviewed Epic and do not see the report.  Will discuss with patient to obtain full panel genetic testing at next visit.   All questions were answered. The patient knows to call the clinic with any problems questions or concerns.  Return of visit: 3 months Thank you for this kind referral and the opportunity to participate in the care of this patient. A copy of today's note is routed to referring provider    ZEarlie Server MD, PhD Hematology Oncology [facility] LLCat AElms Endoscopy CenterPager- [phone]

## 2018-01-10 ENCOUNTER — Ambulatory Visit: Payer: Medicare Other | Admitting: Family Medicine

## 2018-01-10 VITALS — BP 140/82 | HR 92 | Temp 97.6°F

## 2018-01-10 DIAGNOSIS — D509 Iron deficiency anemia, unspecified: Secondary | ICD-10-CM | POA: Diagnosis not present

## 2018-01-10 DIAGNOSIS — N390 Urinary tract infection, site not specified: Secondary | ICD-10-CM

## 2018-01-10 LAB — POCT URINALYSIS DIPSTICK
Bilirubin, UA: NEGATIVE
Glucose, UA: NEGATIVE
Ketones, UA: NEGATIVE
LEUKOCYTES UA: NEGATIVE
NITRITE UA: NEGATIVE
PROTEIN UA: NEGATIVE
RBC UA: NEGATIVE
Urobilinogen, UA: 0.2 E.U./dL
pH, UA: 5 (ref 5.0–8.0)

## 2018-01-10 NOTE — Progress Notes (Signed)
Claire Kane  MRN: [id number] DOB: 19-Aug-1941  Subjective:  HPI    Patient is a 77 year old female who presents for evaluation of possible urinary infection.  She states that she has seen Dr Kenton Kingfisher for her pessary check and she had complained of urinary symptoms.  He treated her with Bactrum.  She then saw Dr. Vicente Males for colonoscopy and still had urinary symptoms.  She did a urinalysis on her but did not prescribe any medication.  She states she is still having symptoms of frequency, urgency and low back pain.    Follow up anemia-the patient was also due to see [place] about her anemia.  She is being seen by Dr Vicente Males and has had endoscopy and colonoscopy looking for a source.  The patient states they are going to do the capsule study next.      Patient Active Problem List   Diagnosis Date Noted  . Acute cystitis without hematuria 12/12/2017  . Cystocele, midline 04/05/2017  . Vaginal prolapse 04/05/2017  . Iron deficiency anemia 04/28/2016  . Shortness of breath 11/13/2015  . Carotid artery narrowing 11/13/2015  . Cataract 11/04/2015  . Clinical depression 11/04/2015  . Dyslipidemia 11/04/2015  . 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 Acid reflux 11/04/2015  . Adiposity 11/04/2015  . 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 Awareness of heartbeats 11/04/2015  . Fibrocystic breast disease 08/19/2015  . Carotid stenosis 08/19/2015  . Osteoarthritis 08/19/2015  . Obesity 08/19/2015  . Hyperlipidemia 08/19/2015  . 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 Heart murmur, systolic 40/98/1191  . 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 Other specified symptoms and signs involving the circulatory and respiratory systems 06/17/2011  . Acid indigestion 06/17/2011  . Gout 06/17/2011    Past Medical History:  Diagnosis Date  . Anemia   .  Anxiety   . Arthritis   . 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 Breast cancer ([place]) 2002   DCIS right breast   . Cataract   . GERD (gastroesophageal reflux disease)   . Heart murmur 2001  . Hiatal hernia   . High cholesterol   . Hypertension   . Personal history of radiation therapy 2002   BREAST CA  . Rosacea   . 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 complications     Social History   Socioeconomic History  . Marital status: Married    Spouse name: Not on file  . Number of children: Not on file  . Years of education: Not on file  . Highest education level: Not on file  Occupational History  . Not on file  Social Needs  . Financial resource strain: Not on file  . Food insecurity:    Worry: Not on file    Inability: Not on file  . Transportation needs:    Medical: Not on file    Non-medical: Not on file  Tobacco Use  . Smoking status: Never Smoker  . Smokeless tobacco: Never Used  Substance and Sexual Activity  . Alcohol use: No  . Drug use: No  . Sexual activity: Never  Lifestyle  . Physical activity:    Days per week: Not on file    Minutes per session: Not on file  . Stress: Not on file  Relationships  . Social connections:    Talks on phone: Not  on file    Gets together: Not on file    Attends religious service: Not on file    Active member of club or organization: Not on file    Attends meetings of clubs or organizations: Not on file    Relationship status: Not on file  . Intimate partner violence:    Fear of current or ex partner: Not on file    Emotionally abused: Not on file    Physically abused: Not on file    Forced sexual activity: Not on file  Other Topics Concern  . Not on file  Social History Narrative  . Not on file    Outpatient Encounter Medications as of 01/10/2018  Medication Sig Note  . atorvastatin (LIPITOR) 10 MG tablet Take 10 mg by mouth daily.   . calcium citrate (CALCITRATE - DOSED IN MG ELEMENTAL CALCIUM)  950 MG tablet Take 200 mg of elemental calcium by mouth 2 (two) times daily.    Marland Kitchen docusate sodium (STOOL SOFTENER) 100 MG capsule Take 100 mg by mouth daily as needed for mild constipation.   Marland Kitchen ezetimibe (ZETIA) 10 MG tablet Take 10 mg by mouth daily.   . Glucosamine 750 MG TABS Take by mouth daily.   . Multiple Vitamins-Minerals (MULTIVITAMIN WITH MINERALS) tablet Take 1 tablet by mouth daily.   Marland Kitchen omeprazole (PRILOSEC) 20 MG capsule Take 1 capsule (20 mg total) by mouth daily.   Darla Lesches 40 MG capsule PRN 05/28/2015: Received from: External Pharmacy  . polyethylene glycol powder (MIRALAX) powder Take 17 g by mouth daily as needed.   . psyllium (REGULOID) 0.52 G capsule Take 0.52 g by mouth daily.   . ranitidine (ZANTAC) 150 MG tablet Take 1 tablet (150 mg total) by mouth daily.   Marland Kitchen senna (SENOKOT) 8.6 MG tablet Take 1 tablet by mouth daily.   . SOOLANTRA 1 % CREA  07/25/2016: Received from: External Pharmacy  . spironolactone (ALDACTONE) 50 MG tablet 50 mg 2 (two) times daily.  05/28/2015: Received from: [facility]  . traMADol (ULTRAM) 50 MG tablet Take 1 tablet (50 mg total) by mouth every 6 (six) hours as needed.   . TURMERIC PO Take by mouth daily.   [name] 625 MG tablet Take 1 tablet by mouth 3 (three) times daily.  07/25/2013: Received from: External Pharmacy Received Sig:    No facility-administered encounter medications on file as of 01/10/2018.     No Known Allergies  Review of Systems  Constitutional: Positive for malaise/fatigue. Negative for fever.  Eyes: Negative.   Respiratory: Positive for shortness of breath. Negative for cough and wheezing.   Cardiovascular: Negative for chest pain, palpitations, orthopnea, claudication, leg swelling and PND.  Gastrointestinal: Negative.   Genitourinary: Positive for frequency and urgency. Negative for dysuria, flank pain and hematuria.  Musculoskeletal: Positive for back pain.  Skin: Negative.   Neurological: Negative  for dizziness and headaches.  Endo/Heme/Allergies: Negative.   Psychiatric/Behavioral: Negative.     Objective:  BP 140/82 (BP Location: Left Arm, Patient Position: Sitting, Cuff Size: Normal)   Pulse 92   Temp 97.6 F (36.4 C) (Oral)   Physical Exam  Constitutional: She is oriented to person, place, and time and well-developed, well-nourished, and in no distress.  HENT:  Head: Normocephalic and atraumatic.  Eyes: Conjunctivae are normal. No scleral icterus.  Neck: No thyromegaly present.  Cardiovascular: Normal rate, regular rhythm and normal heart sounds.  Pulmonary/Chest: Effort normal and breath sounds normal.  Abdominal:  Soft.  Neurological: She is alert and oriented to person, place, and time. Gait normal. GCS score is 15.  Skin: Skin is warm and dry.  Psychiatric: Mood, memory, affect and judgment normal.    Assessment and Plan :  Urinary tract infection without hematuria, site unspecified - Plan: POCT urinalysis dipstick, Urine Culture, Urine Culture Anemia GI and Hematology both seeing pt.  I have done the exam and reviewed the chart and it is accurate to the best of my knowledge. Development worker, community has been used and  any errors in dictation or transcription are unintentional. Miguel Aschoff M.D. [facility]

## 2018-01-12 LAB — URINE CULTURE

## 2018-01-15 ENCOUNTER — Telehealth: Payer: Self-pay

## 2018-01-15 ENCOUNTER — Telehealth: Payer: Self-pay | Admitting: Gastroenterology

## 2018-01-15 NOTE — Telephone Encounter (Signed)
Pt advised.   Thanks,   -Claire Kane  

## 2018-01-15 NOTE — Telephone Encounter (Signed)
-----   Message from Jerrol Banana., MD sent at 01/12/2018 11:46 AM EDT ----- No UTI

## 2018-01-15 NOTE — Telephone Encounter (Signed)
Pt left vm to set up Capsule study please call pt

## 2018-01-17 ENCOUNTER — Other Ambulatory Visit: Payer: Self-pay

## 2018-01-17 ENCOUNTER — Telehealth: Payer: Self-pay | Admitting: Gastroenterology

## 2018-01-17 ENCOUNTER — Ambulatory Visit: Payer: Self-pay | Admitting: Family Medicine

## 2018-01-17 DIAGNOSIS — D509 Iron deficiency anemia, unspecified: Secondary | ICD-10-CM

## 2018-01-17 NOTE — Telephone Encounter (Signed)
Pt left vm stating she has been calling trying to get a FU capsule study  scheduled please call pt [phone]

## 2018-01-22 ENCOUNTER — Telehealth: Payer: Self-pay | Admitting: Gastroenterology

## 2018-01-22 NOTE — Telephone Encounter (Signed)
Please call patient regarding the medication Simethicome. She can only find it in infant form.  She gave permission for you to discuss this with her husband Fritz Pickerel if she isn't home.

## 2018-01-23 ENCOUNTER — Ambulatory Visit
Admission: RE | Admit: 2018-01-23 | Discharge: 2018-01-23 | Disposition: A | Discharge status: Home / Self Care | Payer: Medicare Other | Source: Ambulatory Visit | Attending: Gastroenterology | Admitting: Gastroenterology

## 2018-01-23 ENCOUNTER — Encounter
Admission: RE | Disposition: A | Discharge status: Home / Self Care | Payer: Self-pay | Source: Ambulatory Visit | Attending: Gastroenterology

## 2018-01-23 DIAGNOSIS — D509 Iron deficiency anemia, unspecified: Secondary | ICD-10-CM | POA: Insufficient documentation

## 2018-01-23 HISTORY — PX: GIVENS CAPSULE STUDY: SHX5432

## 2018-01-23 SURGERY — IMAGING PROCEDURE, GI TRACT, INTRALUMINAL, VIA CAPSULE

## 2018-01-24 ENCOUNTER — Encounter: Payer: Self-pay | Admitting: Gastroenterology

## 2018-01-30 ENCOUNTER — Telehealth: Payer: Self-pay | Admitting: Gastroenterology

## 2018-01-30 NOTE — Telephone Encounter (Signed)
Patient was wondering when she might hear results from her capsule test and also, she didn't see any evidence of her passing the capsule. She knows you are out today and will be waiting for your call on Wednesday.

## 2018-01-31 NOTE — Telephone Encounter (Signed)
Advised patient of capsule study results.   Claire Kane says that she has passed the capsule.  Patient has scheduled an office visit for follow-up on IDA

## 2018-02-16 ENCOUNTER — Other Ambulatory Visit: Payer: Self-pay | Admitting: Physician Assistant

## 2018-02-16 ENCOUNTER — Other Ambulatory Visit: Payer: Self-pay | Admitting: Family Medicine

## 2018-02-16 DIAGNOSIS — K30 Functional dyspepsia: Secondary | ICD-10-CM

## 2018-02-19 NOTE — Telephone Encounter (Signed)
Went to [place], needs refill

## 2018-02-20 ENCOUNTER — Ambulatory Visit (INDEPENDENT_AMBULATORY_CARE_PROVIDER_SITE_OTHER): Payer: Medicare Other

## 2018-02-20 ENCOUNTER — Ambulatory Visit (INDEPENDENT_AMBULATORY_CARE_PROVIDER_SITE_OTHER): Payer: Medicare Other | Admitting: Family Medicine

## 2018-02-20 ENCOUNTER — Encounter: Payer: Self-pay | Admitting: Family Medicine

## 2018-02-20 VITALS — BP 128/70 | HR 80 | Temp 98.2°F | Resp 16 | Ht 64.0 in | Wt 189.0 lb

## 2018-02-20 VITALS — BP 128/70 | HR 80 | Temp 98.2°F | Ht 64.0 in | Wt 189.6 lb

## 2018-02-20 DIAGNOSIS — Z0000 Encounter for general adult medical examination without abnormal findings: Secondary | ICD-10-CM | POA: Diagnosis not present

## 2018-02-20 NOTE — Patient Instructions (Addendum)
Claire Kane , Thank you for taking time to come for your Medicare Wellness Visit. I appreciate your ongoing commitment to your health goals. Please review the following plan we discussed and let me know if I can assist you in the future.   Screening recommendations/referrals: Colonoscopy: Up to date Mammogram: Up to date Bone Density: Up to date Recommended yearly ophthalmology/optometry visit for glaucoma screening and checkup Recommended yearly dental visit for hygiene and checkup  Vaccinations: Influenza vaccine: Up to date Pneumococcal vaccine: Up to date Tdap vaccine: Up to date Shingles vaccine: Pt declines today.     Advanced directives: Please bring a copy of your POA (Power of Attorney) and/or Living Will to your next appointment.   Conditions/risks identified: Obesity- recommend increasing water intake to 4-6 glasses a day and decreasing soda and tea intake.   Next appointment: 11:20 AM today with Dr Rosanna Randy.    Preventive Care 77 Years and Older, Female Preventive care refers to lifestyle choices and visits with your health care provider that can promote health and wellness. What does preventive care include?  A yearly physical exam. This is also called an annual well check.  Dental exams once or twice a year.  Routine eye exams. Ask your health care provider how often you should have your eyes checked.  Personal lifestyle choices, including:  Daily care of your teeth and gums.  Regular physical activity.  Eating a healthy diet.  Avoiding tobacco and drug use.  Limiting alcohol use.  Practicing safe sex.  Taking low-dose aspirin every day.  Taking vitamin and mineral supplements as recommended by your health care provider. What happens during an annual well check? The services and screenings done by your health care provider during your annual well check will depend on your age, overall health, lifestyle risk factors, and family history of  disease. Counseling  Your health care provider may ask you questions about your:  Alcohol use.  Tobacco use.  Drug use.  Emotional well-being.  Home and relationship well-being.  Sexual activity.  Eating habits.  History of falls.  Memory and ability to understand (cognition).  Work and work Statistician.  Reproductive health. Screening  You may have the following tests or measurements:  Height, weight, and BMI.  Blood pressure.  Lipid and cholesterol levels. These may be checked every 5 years, or more frequently if you are over 46 years old.  Skin check.  Lung cancer screening. You may have this screening every year starting at age 77 if you have a 30-pack-year history of smoking and currently smoke or have quit within the past 15 years.  Fecal occult blood test (FOBT) of the stool. You may have this test every year starting at age 38.  Flexible sigmoidoscopy or colonoscopy. You may have a sigmoidoscopy every 5 years or a colonoscopy every 10 years starting at age 61.  Hepatitis C blood test.  Hepatitis B blood test.  Sexually transmitted disease (STD) testing.  Diabetes screening. This is done by checking your blood sugar (glucose) after you have not eaten for a while (fasting). You may have this done every 1-3 years.  Bone density scan. This is done to screen for osteoporosis. You may have this done starting at age 77.  Mammogram. This may be done every 1-2 years. Talk to your health care provider about how often you should have regular mammograms. Talk with your health care provider about your test results, treatment options, and if necessary, the need for more tests. Vaccines  Your health care provider may recommend certain vaccines, such as:  Influenza vaccine. This is recommended every year.  Tetanus, diphtheria, and acellular pertussis (Tdap, Td) vaccine. You may need a Td booster every 10 years.  Zoster vaccine. You may need this after age  77.  Pneumococcal 13-valent conjugate (PCV13) vaccine. One dose is recommended after age 19.  Pneumococcal polysaccharide (PPSV23) vaccine. One dose is recommended after age 2. Talk to your health care provider about which screenings and vaccines you need and how often you need them. This information is not intended to replace advice given to you by your health care provider. Make sure you discuss any questions you have with your health care provider. Document Released: 10/02/2015 Document Revised: 05/25/2016 Document Reviewed: 07/07/2015 Elsevier Interactive Patient Education  2017 [employer] Prevention in the Home Falls can cause injuries. They can happen to people of all ages. There are many things you can do to make your home safe and to help prevent falls. What can I do on the outside of my home?  Regularly fix the edges of walkways and driveways and fix any cracks.  Remove anything that might make you trip as you walk through a door, such as a raised step or threshold.  Trim any bushes or trees on the path to your home.  Use bright outdoor lighting.  Clear any walking paths of anything that might make someone trip, such as rocks or tools.  Regularly check to see if handrails are loose or broken. Make sure that both sides of any steps have handrails.  Any raised decks and porches should have guardrails on the edges.  Have any leaves, snow, or ice cleared regularly.  Use sand or salt on walking paths during winter.  Clean up any spills in your garage right away. This includes oil or grease spills. What can I do in the bathroom?  Use night lights.  Install grab bars by the toilet and in the tub and shower. Do not use towel bars as grab bars.  Use non-skid mats or decals in the tub or shower.  If you need to sit down in the shower, use a plastic, non-slip stool.  Keep the floor dry. Clean up any water that spills on the floor as soon as it happens.  Remove  soap buildup in the tub or shower regularly.  Attach bath mats securely with double-sided non-slip rug tape.  Do not have throw rugs and other things on the floor that can make you trip. What can I do in the bedroom?  Use night lights.  Make sure that you have a light by your bed that is easy to reach.  Do not use any sheets or blankets that are too big for your bed. They should not hang down onto the floor.  Have a firm chair that has side arms. You can use this for support while you get dressed.  Do not have throw rugs and other things on the floor that can make you trip. What can I do in the kitchen?  Clean up any spills right away.  Avoid walking on wet floors.  Keep items that you use a lot in easy-to-reach places.  If you need to reach something above you, use a strong step stool that has a grab bar.  Keep electrical cords out of the way.  Do not use floor polish or wax that makes floors slippery. If you must use wax, use non-skid floor wax.  Do  not have throw rugs and other things on the floor that can make you trip. What can I do with my stairs?  Do not leave any items on the stairs.  Make sure that there are handrails on both sides of the stairs and use them. Fix handrails that are broken or loose. Make sure that handrails are as long as the stairways.  Check any carpeting to make sure that it is firmly attached to the stairs. Fix any carpet that is loose or worn.  Avoid having throw rugs at the top or bottom of the stairs. If you do have throw rugs, attach them to the floor with carpet tape.  Make sure that you have a light switch at the top of the stairs and the bottom of the stairs. If you do not have them, ask someone to add them for you. What else can I do to help prevent falls?  Wear shoes that:  Do not have high heels.  Have rubber bottoms.  Are comfortable and fit you well.  Are closed at the toe. Do not wear sandals.  If you use a  stepladder:  Make sure that it is fully opened. Do not climb a closed stepladder.  Make sure that both sides of the stepladder are locked into place.  Ask someone to hold it for you, if possible.  Clearly mark and make sure that you can see:  Any grab bars or handrails.  First and last steps.  Where the edge of each step is.  Use tools that help you move around (mobility aids) if they are needed. These include:  Canes.  Walkers.  Scooters.  Crutches.  Turn on the lights when you go into a dark area. Replace any light bulbs as soon as they burn out.  Set up your furniture so you have a clear path. Avoid moving your furniture around.  If any of your floors are uneven, fix them.  If there are any pets around you, be aware of where they are.  Review your medicines with your doctor. Some medicines can make you feel dizzy. This can increase your chance of falling. Ask your doctor what other things that you can do to help prevent falls. This information is not intended to replace advice given to you by your health care provider. Make sure you discuss any questions you have with your health care provider. Document Released: 07/02/2009 Document Revised: 02/11/2016 Document Reviewed: 10/10/2014 Elsevier Interactive Patient Education  2017 [employer].

## 2018-02-20 NOTE — Progress Notes (Signed)
Patient: Claire Kane, Female    DOB: 1941/04/20, 77 y.o.   MRN: [id number] Visit Date: 02/20/2018  Today's Provider: Wilhemena Durie, MD   Chief Complaint  Patient presents with  . Annual Exam   Subjective:  Claire Kane is a 77 y.o. female who presents today for health maintenance and complete physical. She feels well. She reports exercising none. She reports she is sleeping well.  12/22/17 Colonoscopy, Dr John Giovanni hemorrhoids, Diverticulosis 08/04/17 Mammogram-Negative 04/05/12 BMD-Normal  Review of Systems  Constitutional: Positive for fatigue.  HENT: Negative.   Eyes: Negative.   Respiratory: Positive for shortness of breath.   Cardiovascular: Negative.   Gastrointestinal: Positive for constipation.  Endocrine: Negative.   Genitourinary: Positive for enuresis, frequency and urgency.  Musculoskeletal: Positive for arthralgias and back pain.  Skin: Negative.   Allergic/Immunologic: Negative.   Neurological: Negative.   Hematological: Bruises/bleeds easily.  Psychiatric/Behavioral: Negative.     Social History   Socioeconomic History  . Marital status: Married    Spouse name: Not on file  . Number of children: 2  . Years of education: Not on file  . Highest education level: Bachelor's degree (e.g., BA, AB, BS)  Occupational History  . Occupation: retired  Scientific laboratory technician  . Financial resource strain: Not hard at all  . Food insecurity:    Worry: Never true    Inability: Never true  . Transportation needs:    Medical: No    Non-medical: No  Tobacco Use  . Smoking status: Never Smoker  . Smokeless tobacco: Never Used  Substance and Sexual Activity  . Alcohol use: No  . Drug use: No  . Sexual activity: Never  Lifestyle  . Physical activity:    Days per week: Not on file    Minutes per session: Not on file  . Stress: Only a little  Relationships  . Social connections:    Talks on phone: Not on file    Gets together: Not on file    Attends religious  service: Not on file    Active member of club or organization: Not on file    Attends meetings of clubs or organizations: Not on file    Relationship status: Not on file  . Intimate partner violence:    Fear of current or ex partner: Not on file    Emotionally abused: Not on file    Physically abused: Not on file    Forced sexual activity: Not on file  Other Topics Concern  . Not on file  Social History Narrative  . Not on file    Patient Active Problem List   Diagnosis Date Noted  . Acute cystitis without hematuria 12/12/2017  . Cystocele, midline 04/05/2017  . Vaginal prolapse 04/05/2017  . Iron deficiency anemia 04/28/2016  . Shortness of breath 11/13/2015  . Carotid artery narrowing 11/13/2015  . Cataract 11/04/2015  . Clinical depression 11/04/2015  . Dyslipidemia 11/04/2015  . 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 Acid reflux 11/04/2015  . Adiposity 11/04/2015  . 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 Awareness of heartbeats 11/04/2015  . Fibrocystic breast disease 08/19/2015  . Carotid stenosis 08/19/2015  . Osteoarthritis 08/19/2015  . Obesity 08/19/2015  . Hyperlipidemia 08/19/2015  . 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 Heart murmur, systolic 13/24/4010  . 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 Other specified symptoms and signs  involving the circulatory and respiratory systems 06/17/2011  . Acid indigestion 06/17/2011  . Gout 06/17/2011    Past Surgical History:  Procedure Laterality Date  . ABDOMINAL HYSTERECTOMY    . BREAST CYST ASPIRATION Left 2003  . BREAST EXCISIONAL BIOPSY Right 2002   POS  . BREAST SURGERY Right 2002   lumpectomy  . CHOLECYSTECTOMY    . COLONOSCOPY  2013  . COLONOSCOPY WITH PROPOFOL N/A 12/08/2015   Procedure: COLONOSCOPY WITH PROPOFOL;  Surgeon: Christene Lye, MD;  Location: [facility] ENDOSCOPY;  Service: Endoscopy;  Laterality: N/A;  . COLONOSCOPY WITH PROPOFOL N/A 12/06/2017   Procedure: COLONOSCOPY WITH PROPOFOL;  Surgeon: Jonathon Bellows, MD;  Location: [facility] ENDOSCOPY;  Service: Gastroenterology;  Laterality: N/A;  . COLONOSCOPY WITH PROPOFOL N/A 12/22/2017   Procedure: COLONOSCOPY WITH PROPOFOL;  Surgeon: Jonathon Bellows, MD;  Location: [facility] ENDOSCOPY;  Service: Gastroenterology;  Laterality: N/A;  . ESOPHAGOGASTRODUODENOSCOPY (EGD) WITH PROPOFOL N/A 12/08/2015   Procedure: ESOPHAGOGASTRODUODENOSCOPY (EGD) WITH PROPOFOL;  Surgeon: Christene Lye, MD;  Location: [facility] ENDOSCOPY;  Service: Endoscopy;  Laterality: N/A;  . ESOPHAGOGASTRODUODENOSCOPY (EGD) WITH PROPOFOL N/A 12/06/2017   Procedure: ESOPHAGOGASTRODUODENOSCOPY (EGD) WITH PROPOFOL;  Surgeon: Jonathon Bellows, MD;  [facility] ENDOSCOPY;  Service: Gastroenterology;  Laterality: N/A;  . ESOPHAGOGASTRODUODENOSCOPY (EGD) WITH PROPOFOL N/A 12/22/2017   Procedure: ESOPHAGOGASTRODUODENOSCOPY (EGD) WITH PROPOFOL;  Surgeon: Jonathon Bellows, MD;  Location: [facility] ENDOSCOPY;  Service: Gastroenterology;  Laterality: N/A;  . EYE SURGERY Right 2014  . GIVENS CAPSULE STUDY N/A 01/23/2018   Procedure: GIVENS CAPSULE STUDY;  Surgeon: Jonathon Bellows, MD;  Location: [facility] ENDOSCOPY;  Service: Gastroenterology;  Laterality: N/A;  . post radiation therapy Right    right breast cancer 2002  . Stab pheblectomy   2010  . vein closure procedure Bilateral 2008    Her family history includes Breast cancer (age of onset: 83) in her other; Breast cancer (age of onset: 56) in her cousin; Breast cancer (age of onset: 31) in her maternal aunt; Cancer (age of onset: 75) in her cousin; Heart attack in her brother, father, mother, and sister; Other in her brother; Stroke in her sister.     Outpatient Encounter Medications as of 02/20/2018  Medication Sig Note  . atorvastatin (LIPITOR) 10 MG tablet Take 10 mg by mouth daily.   . calcium  citrate (CALCITRATE - DOSED IN MG ELEMENTAL CALCIUM) 950 MG tablet Take 200 mg of elemental calcium by mouth 2 (two) times daily.    Marland Kitchen docusate sodium (STOOL SOFTENER) 100 MG capsule Take 100 mg by mouth daily as needed for mild constipation.   Marland Kitchen ezetimibe (ZETIA) 10 MG tablet Take 10 mg by mouth daily.   . Glucosamine 750 MG TABS Take by mouth daily.   . Multiple Vitamins-Minerals (MULTIVITAMIN WITH MINERALS) tablet Take 1 tablet by mouth daily.   Marland Kitchen omeprazole (PRILOSEC) 20 MG capsule TAKE 1 CAPSULE BY MOUTH  DAILY   . ORACEA 40 MG capsule PRN 05/28/2015: Received from: External Pharmacy  . psyllium (REGULOID) 0.52 G capsule Take 0.52 g by mouth daily.   . ranitidine (ZANTAC) 150 MG tablet Take 1 tablet (150 mg total) by mouth daily.   Marland Kitchen senna (SENOKOT) 8.6 MG tablet Take 1 tablet by mouth daily.   . SOOLANTRA 1 % CREA  07/25/2016: Received from: External Pharmacy  . spironolactone (ALDACTONE) 50 MG tablet 50 mg 2 (two) times daily.  05/28/2015: Received from: [place]  . traMADol (ULTRAM) 50 MG tablet TAKE 1 TABLET BY  MOUTH  EVERY 6 HOURS AS NEEDED (Patient taking differently: TAKE 1 TABLET BY MOUTH  EVERY 6 HOURS AS NEEDED (currently taking 1 a day))   . TURMERIC PO Take by mouth daily.   [name] 625 MG tablet Take 1 tablet by mouth 3 (three) times daily.  07/25/2013: Received from: External Pharmacy Received Sig:    No facility-administered encounter medications on file as of 02/20/2018.     Patient Care Team: Jerrol Banana., MD as PCP - General (Family Medicine) Dingeldein, Remo Lipps, MD as Consulting Physician (Ophthalmology) Gae Dry, MD as Referring Physician (Obstetrics and Gynecology) Jonathon Bellows, MD as Consulting Physician (Gastroenterology) Earlie Server, MD as Consulting Physician (Oncology) Thomes Dinning, MD as Referring Physician (Internal Medicine)      Objective:   Vitals:  Vitals:   02/20/18 1107  BP: 128/70  Pulse: 80  Resp: 16  Temp:  98.2 F (36.8 C)  TempSrc: Oral  Weight: 189 lb (85.7 kg)  Height: 5\' 4"  (1.626 m)    Physical Exam  Constitutional: She is oriented to person, place, and time. She appears well-developed and well-nourished.  HENT:  Head: Normocephalic and atraumatic.  Right Ear: External ear normal.  Left Ear: External ear normal.  Nose: Nose normal.  Mouth/Throat: Oropharynx is clear and moist.  Eyes: Pupils are equal, round, and reactive to light. Conjunctivae and EOM are normal.  Neck: Normal range of motion. Neck supple.  Cardiovascular: Normal rate, regular rhythm, normal heart sounds and intact distal pulses.  Pulmonary/Chest: Effort normal and breath sounds normal.  Abdominal: Soft. Bowel sounds are normal.  Musculoskeletal: Normal range of motion.  Neurological: She is alert and oriented to person, place, and time.  Skin: Skin is warm and dry.  Psychiatric: She has a normal mood and affect. Her behavior is normal. Judgment and thought content normal.     Depression Screen PHQ 2/9 Scores 02/20/2018 12/13/2016 12/13/2016 03/16/2016  PHQ - 2 Score 0 0 0 0  PHQ- 9 Score 3 0 - -      Assessment & Plan:     Routine Health Maintenance and Physical Exam  Exercise Activities and Dietary recommendations Goals    . DIET - INCREASE WATER INTAKE     Recommend increasing water intake to 4-6 glasses a day and decreasing soda and tea intake.        Immunization History  Administered Date(s) Administered  . Influenza, High Dose Seasonal PF 05/28/2015, 07/05/2017  . Influenza-Unspecified 05/09/2016  . Pneumococcal Conjugate-13 04/22/2014  . Pneumococcal Polysaccharide-23 03/15/2011  . Td 09/06/2007, 12/30/2012  . Tdap 04/22/2014  . Zoster 06/27/2011    Health Maintenance  Topic Date Due  . INFLUENZA VACCINE  04/19/2018  . TETANUS/TDAP  04/22/2024  . DEXA SCAN  Completed  . PNA vac Low Risk Adult  Completed     Discussed health benefits of physical activity, and encouraged her to  engage in regular exercise appropriate for her age and condition.  GERD Stop Zantac then stop prilosec 1 month. RTC 2-3 months.   I have done the exam and reviewed the chart and it is accurate to the best of my knowledge. Development worker, community has been used and  any errors in dictation or transcription are unintentional. Miguel Aschoff M.D. [facility]

## 2018-02-20 NOTE — Progress Notes (Signed)
Subjective:   Claire Kane is a 77 y.o. female who presents for Medicare Annual (Subsequent) preventive examination.  Review of Systems:  N/A  Cardiac Risk Factors include: advanced age (>20mn, >>64women);dyslipidemia;obesity (BMI >30kg/m2)     Objective:     Vitals: BP 128/70 (BP Location: Left Arm)   Pulse 80   Temp 98.2 F (36.8 C) (Oral)   Ht _0  (1.626 m)   Wt 189 lb 9.6 oz (86 kg)   BMI 32.54 kg/m   Body mass index is 32.54 kg/m.  Advanced Directives 02/20/2018 01/09/2018 12/07/2017 12/06/2017 12/13/2016 07/25/2016 03/16/2016  Does Patient Have a Medical Advance Directive? Yes Yes Yes No;Yes Yes No Yes  Type of AParamedicof [place]lag[place]Living will [place]Country ClubLiving will [place]Living will Out of facility DNR (pink MOST or yellow form) HMethowLiving will - -  Does patient want to make changes to medical advance directive? - - - - - - -  Copy of HIsabelain Chart? No - copy requested - - - No - copy requested - -  Would patient like information on creating a medical advance directive? - - - - - No - patient declined information -    Tobacco Social History   Tobacco Use  Smoking Status Never Smoker  Smokeless Tobacco Never Used     Counseling given: Not Answered   Clinical Intake:  Pre-visit preparation completed: Yes  Pain : No/denies pain Pain Score: 0-No pain     Nutritional Status: BMI > 30  Obese Nutritional Risks: None Diabetes: No  How often do you need to have someone help you when you read instructions, pamphlets, or other written materials from your doctor or pharmacy?: 1 - Never  Interpreter Needed?: No  Information [facility] :: [facility]  Past Medical History:  Diagnosis Date  . Anemia   . Anxiety   . Arthritis   . 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 [place]   DCIS right breast   . Cataract   . GERD  (gastroesophageal reflux disease)   . Heart murmur 2001  . Hiatal hernia   . High cholesterol   . Hypertension   . Personal history of radiation therapy 2002   BREAST CA  . Rosacea   . 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 complications    Past Surgical History:  Procedure Laterality Date  . ABDOMINAL HYSTERECTOMY    . BREAST CYST ASPIRATION Left 2003  . BREAST EXCISIONAL BIOPSY Right 2002   POS  . BREAST SURGERY Right 2002   lumpectomy  . CHOLECYSTECTOMY    . COLONOSCOPY  2013  . COLONOSCOPY WITH PROPOFOL N/A 12/08/2015   Procedure: COLONOSCOPY WITH PROPOFOL;  Surgeon: SChristene Lye MD;  Location: [facility] ENDOSCOPY;  Service: Endoscopy;  Laterality: N/A;  . COLONOSCOPY WITH PROPOFOL N/A 12/06/2017   Procedure: COLONOSCOPY WITH PROPOFOL;  Surgeon: AJonathon Bellows MD;  [facility];  Service: Gastroenterology;  Laterality: N/A;  . COLONOSCOPY WITH PROPOFOL N/A 12/22/2017   Procedure: COLONOSCOPY WITH PROPOFOL;  Surgeon: AJonathon Bellows MD;  [facility];  Service: Gastroenterology;  Laterality: N/A;  . ESOPHAGOGASTRODUODENOSCOPY (EGD) WITH PROPOFOL N/A 12/08/2015   Procedure: ESOPHAGOGASTRODUODENOSCOPY (EGD) WITH PROPOFOL;  Surgeon: SChristene Lye MD;  Location: [facility] ENDOSCOPY;  Service: Endoscopy;  Laterality: N/A;  . ESOPHAGOGASTRODUODENOSCOPY (EGD) WITH PROPOFOL N/A 12/06/2017   Procedure: ESOPHAGOGASTRODUODENOSCOPY (EGD) WITH PROPOFOL;  Surgeon: Jonathon Bellows, MD;  Location: [facility] ENDOSCOPY;  Service: Gastroenterology;  Laterality: N/A;  . ESOPHAGOGASTRODUODENOSCOPY (EGD) WITH PROPOFOL N/A 12/22/2017   Procedure: ESOPHAGOGASTRODUODENOSCOPY (EGD) WITH PROPOFOL;  Surgeon: Jonathon Bellows, MD;  Location: [facility] ENDOSCOPY;  Service: Gastroenterology;  Laterality: N/A;  . EYE SURGERY Right 2014  . GIVENS CAPSULE STUDY N/A 01/23/2018   Procedure: GIVENS CAPSULE STUDY;  Surgeon: Jonathon Bellows, MD;  Location: [facility] ENDOSCOPY;  Service: Gastroenterology;  Laterality:  N/A;  . post radiation therapy Right    right breast cancer 2002  . Stab pheblectomy   2010  . vein closure procedure Bilateral 2008   Family History  Problem Relation Age of Onset  . Heart attack Mother   . Heart attack Father   . Heart attack Sister   . Stroke Sister   . Heart attack Brother   . Other Brother        cerebral hemorrhage, sepsis  . Breast cancer Cousin 31       breast/maternal  . Cancer Cousin 64       breast/maternal  . Breast cancer Maternal Aunt 70  . Breast cancer Other 48       maternal neice   Social History   Socioeconomic History  . Marital status: Married    Spouse name: Not on file  . Number of children: 2  . Years of education: Not on file  . Highest education level: Bachelor's degree (e.g., BA, AB, BS)  Occupational History  . Occupation: retired  Scientific laboratory technician  . Financial resource strain: Not hard at all  . Food insecurity:    Worry: Never true    Inability: Never true  . Transportation needs:    Medical: No    Non-medical: No  Tobacco Use  . Smoking status: Never Smoker  . Smokeless tobacco: Never Used  Substance and Sexual Activity  . Alcohol use: No  . Drug use: No  . Sexual activity: Never  Lifestyle  . Physical activity:    Days per week: Not on file    Minutes per session: Not on file  . Stress: Only a little  Relationships  . Social connections:    Talks on phone: Not on file    Gets together: Not on file    Attends religious service: Not on file    Active member of club or organization: Not on file    Attends meetings of clubs or organizations: Not on file    Relationship status: Not on file  Other Topics Concern  . Not on file  Social History Narrative  . Not on file    Outpatient Encounter Medications as of 02/20/2018  Medication Sig  . atorvastatin (LIPITOR) 10 MG tablet Take 10 mg by mouth daily.  . calcium citrate (CALCITRATE - DOSED IN MG ELEMENTAL CALCIUM) 950 MG tablet Take 200 mg of elemental calcium by  mouth 2 (two) times daily.   Marland Kitchen docusate sodium (STOOL SOFTENER) 100 MG capsule Take 100 mg by mouth daily as needed for mild constipation.  Marland Kitchen ezetimibe (ZETIA) 10 MG tablet Take 10 mg by mouth daily.  . Glucosamine 750 MG TABS Take by mouth daily.  . Multiple Vitamins-Minerals (MULTIVITAMIN WITH MINERALS) tablet Take 1 tablet by mouth daily.  Marland Kitchen omeprazole (PRILOSEC) 20 MG capsule TAKE 1 CAPSULE BY MOUTH  DAILY  . ORACEA 40 MG capsule PRN  . SOOLANTRA 1 % CREA   . spironolactone (ALDACTONE) 50 MG tablet 50 mg 2 (two) times daily.   Marland Kitchen  traMADol (ULTRAM) 50 MG tablet TAKE 1 TABLET BY MOUTH  EVERY 6 HOURS AS NEEDED (Patient taking differently: TAKE 1 TABLET BY MOUTH  EVERY 6 HOURS AS NEEDED (currently taking 1 a day))  . WELCHOL 625 MG tablet Take 1 tablet by mouth 3 (three) times daily.   . psyllium (REGULOID) 0.52 G capsule Take 0.52 g by mouth daily.  . ranitidine (ZANTAC) 150 MG tablet Take 1 tablet (150 mg total) by mouth daily. (Patient not taking: Reported on 02/20/2018)  . senna (SENOKOT) 8.6 MG tablet Take 1 tablet by mouth daily.  . TURMERIC PO Take by mouth daily.   No facility-administered encounter medications on file as of 02/20/2018.     Activities of Daily Living In your present state of health, do you have any difficulty performing the following activities: 02/20/2018  Hearing? Y  Comment Occasionally, with background noise.  Vision? N  Difficulty concentrating or making decisions? N  Walking or climbing stairs? Y  Comment Due to arthritis pain in knees or hips.   Dressing or bathing? N  Doing errands, shopping? N  Preparing Food and eating ? N  Using the Toilet? N  In the past six months, have you accidently leaked urine? Y  Comment When does not make it to the bathroom in time.  Do you have problems with loss of bowel control? N  Managing your Medications? N  Managing your Finances? N  Housekeeping or managing your Housekeeping? N  Some recent data might be hidden     Patient Care Team: Jerrol Banana., MD as PCP - General (Family Medicine) Dingeldein, Remo Lipps, MD as Consulting Physician (Ophthalmology) Gae Dry, MD as Referring Physician (Obstetrics and Gynecology) Jonathon Bellows, MD as Consulting Physician (Gastroenterology) Earlie Server, MD as Consulting Physician (Oncology) Thomes Dinning, MD as Referring Physician (Internal Medicine)    Assessment:   This is a routine wellness examination for Keiran.  Exercise Activities and Dietary recommendations Current Exercise Habits: The patient does not participate in regular exercise at present, Exercise limited by: orthopedic condition(s)  Goals    . DIET - INCREASE WATER INTAKE     Recommend increasing water intake to 4-6 glasses a day and decreasing soda and tea intake.        Fall Risk Fall Risk  02/20/2018 01/10/2018 01/10/2018 12/13/2016 03/16/2016  Falls in the past year? No No No Yes No  Number falls in past yr: - - - 1 -  Injury with Fall? - - - Yes -  Comment - - - ongoing headache x months -  Follow up - - - Falls prevention discussed -   Is the patient's home free of loose throw rugs in walkways, pet beds, electrical cords, etc?   yes      Grab bars in the bathroom? yes      Handrails on the stairs?   yes      Adequate lighting?   yes  Timed Get Up and Go performed: N/A  Depression Screen PHQ 2/9 Scores 02/20/2018 12/13/2016 12/13/2016 03/16/2016  PHQ - 2 Score 0 0 0 0  PHQ- 9 Score 3 0 - -     Cognitive Function     6CIT Screen 02/20/2018 12/13/2016  What Year? 0 points 0 points  What month? 0 points 0 points  What time? 0 points 0 points  Count back from 20 0 points 0 points  Months in reverse 0 points 0 points  Repeat phrase 0 points 0  points  Total Score 0 0    Immunization History  Administered Date(s) Administered  . Influenza, High Dose Seasonal PF 05/28/2015, 07/05/2017  . Influenza-Unspecified 05/09/2016  . Pneumococcal Conjugate-13 04/22/2014  .  Pneumococcal Polysaccharide-23 03/15/2011  . Td 09/06/2007, 12/30/2012  . Tdap 04/22/2014  . Zoster 06/27/2011    Qualifies for Shingles Vaccine? Due for Shingles vaccine. Declined my offer to administer today. Education has been provided regarding the importance of this vaccine. Pt has been advised to call her insurance company to determine her out of pocket expense. Advised she may also receive this vaccine at her local pharmacy or Health Dept. Verbalized acceptance and understanding.  Screening Tests Health Maintenance  Topic Date Due  . INFLUENZA VACCINE  04/19/2018  . TETANUS/TDAP  04/22/2024  . DEXA SCAN  Completed  . PNA vac Low Risk Adult  Completed    Cancer Screenings: Lung: Low Dose CT Chest recommended if Age 44-80 years, 30 pack-year currently smoking OR have quit w/in 15years. Patient does not qualify. Breast:  Up to date on Mammogram? Yes   Up to date of Bone Density/Dexa? Yes Colorectal: Up to date  Additional Screenings:  Hepatitis C Screening: N/A     Plan:  I have personally reviewed and addressed the Medicare Annual Wellness questionnaire and have noted the following in the patient's chart:  A. Medical and social history B. Use of alcohol, tobacco or illicit drugs  C. Current medications and supplements D. Functional ability and status E.  Nutritional status F.  Physical activity G. Advance directives H. List of other physicians I.  Hospitalizations, surgeries, and ER visits in previous 12 months [place] such as hearing and vision if needed, cognitive and depression L. Referrals and appointments - none  In addition, I have reviewed and discussed with patient certain preventive protocols, quality metrics, and best practice recommendations. A written personalized care plan for preventive services as well as general preventive health recommendations were provided to patient.  See attached scanned questionnaire for additional information.    Signed,  Fabio Neighbors, LPN Nurse Health Advisor   Nurse Recommendations: None.

## 2018-02-20 NOTE — Patient Instructions (Signed)
Stay off Zantac. Stop Prilosec as of July 4th. Follow up as scheduled.

## 2018-03-01 ENCOUNTER — Encounter: Payer: Self-pay | Admitting: Gastroenterology

## 2018-03-01 ENCOUNTER — Ambulatory Visit: Payer: Medicare Other | Admitting: Gastroenterology

## 2018-03-01 VITALS — BP 129/77 | HR 82 | Ht 65.0 in | Wt 195.4 lb

## 2018-03-01 DIAGNOSIS — K449 Diaphragmatic hernia without obstruction or gangrene: Secondary | ICD-10-CM

## 2018-03-01 DIAGNOSIS — D509 Iron deficiency anemia, unspecified: Secondary | ICD-10-CM

## 2018-03-01 NOTE — Progress Notes (Signed)
Claire Bellows MD, MRCP(U.K) [address]  [place]  [place], [place] [address]  Main: [phone]  Fax: [phone]   Primary Care Physician: Claire Kane., MD  Primary Gastroenterologist:  Dr. Jonathon Kane   Summary of history :  She is here today to follow up to her initial visit when she was seen on 11/29/17 for iron deficiency anemia, rectal bleeding whern she has hard  Stool , vaginal bleeding history was noted. History suggests long standing iron deficiency going back atleast 2 years, has been on iron in the past . CBC back in 10/2015 showed HB 8.3 grams with MCV of 75. Underwent partial GI evaluation with Dr Claire Kane back in 2017 . EGD showed a large hiatal hernia. No biopsies taken to r/o celiac disease. Colonoscopy was in 2017 (withdrawal time only 4 minutes ) showed diverticulosis.    Interval history   11/29/2017-  03/01/2018  12/22/17- EGD 8 cm hiatal hernia , duodenal bx were normal  01/30/18- capsule study was normal . Colonoscopy showed internal hemorrhoids.  She follows with Dr Claire Kane for IV iron   Iron/TIBC/Ferritin/ %Sat    Component Value Date/Time   IRON 56 01/08/2018 1005   IRON 209 (H) 11/29/2017 1512   TIBC 320 01/08/2018 1005   TIBC 374 11/29/2017 1512   FERRITIN 90 01/08/2018 1005   FERRITIN 14 (L) 11/29/2017 1512   IRONPCTSAT 18 01/08/2018 1005   IRONPCTSAT 56 (H) 11/29/2017 1512  B12 and folate normal.    Chief Complaint  Patient presents with  . Abdominal Pain    HPI: Claire Kane is a 77 y.o. female  Current Outpatient Medications  Medication Sig Dispense Refill  . atorvastatin (LIPITOR) 10 MG tablet Take 10 mg by mouth daily.    . calcium citrate (CALCITRATE - DOSED IN MG ELEMENTAL CALCIUM) 950 MG tablet Take 200 mg of elemental calcium by mouth 2 (two) times daily.     Marland Kitchen docusate sodium (STOOL SOFTENER) 100 MG capsule Take 100 mg by mouth daily as needed for mild constipation.    Marland Kitchen ezetimibe (ZETIA) 10 MG tablet Take 10 mg by mouth  daily.    . Glucosamine 750 MG TABS Take by mouth daily.    . Multiple Vitamins-Minerals (MULTIVITAMIN WITH MINERALS) tablet Take 1 tablet by mouth daily.    Marland Kitchen omeprazole (PRILOSEC) 20 MG capsule TAKE 1 CAPSULE BY MOUTH  DAILY 90 capsule 3  . ORACEA 40 MG capsule PRN    . psyllium (REGULOID) 0.52 G capsule Take 0.52 g by mouth daily.    Marland Kitchen senna (SENOKOT) 8.6 MG tablet Take 1 tablet by mouth daily.    . SOOLANTRA 1 % CREA     . spironolactone (ALDACTONE) 50 MG tablet 50 mg 2 (two) times daily.     . traMADol (ULTRAM) 50 MG tablet TAKE 1 TABLET BY MOUTH  EVERY 6 HOURS AS NEEDED (Patient taking differently: TAKE 1 TABLET BY MOUTH  EVERY 6 HOURS AS NEEDED (currently taking 1 a day)) 100 tablet 3  . TURMERIC PO Take by mouth daily.    Claire Kane 625 MG tablet Take 1 tablet by mouth 3 (three) times daily.      No current facility-administered medications for this visit.     Allergies as of 03/01/2018  . (No Known Allergies)    ROS:  General: Negative for anorexia, weight loss, fever, chills, fatigue, weakness. ENT: Negative for hoarseness, difficulty swallowing , nasal congestion. CV: Negative for chest pain,  angina, palpitations, dyspnea on exertion, peripheral edema.  Respiratory: Negative for dyspnea at rest, dyspnea on exertion, cough, sputum, wheezing.  GI: See history of present illness. GU:  Negative for dysuria, hematuria, urinary incontinence, urinary frequency, nocturnal urination.  Endo: Negative for unusual weight change.    Physical Examination:   BP 129/77   Pulse 82   Ht 5\' 5"  (1.651 m)   Wt 195 lb 6.4 oz (88.6 kg)   BMI 32.52 kg/m   General: Well-nourished, well-developed in no acute distress.  Eyes: No icterus. Conjunctivae pink. Mouth: Oropharyngeal mucosa moist and pink , no lesions erythema or exudate. Lungs: Clear to auscultation bilaterally. Non-labored. Heart: Regular rate and rhythm, no murmurs rubs or gallops.  Abdomen: Bowel sounds are normal, nontender,  nondistended, no hepatosplenomegaly or masses, no abdominal bruits or hernia , no rebound or guarding.   Extremities: No lower extremity edema. No clubbing or deformities. Neuro: Alert and oriented x 3.  Grossly intact. Skin: Warm and dry, no jaundice.   Psych: Alert and cooperative, normal mood and affect.   Imaging Studies: No results found.  Assessment and Plan:   Claire Kane is a 77 y.o. y/o female here to follow up for iron deficiency anemia. Long standing from atleast 2017 . Negative GI work up except for large hiatal hernia . It is  possible large hiatal hernia contributing to the iron deficiency anemia. Studies have shown that repair of the large hiatal hernias can resolve iron deficiency anemia but at her age risks vs benefit ratio needs to be decided upon. At present she is not keen on any surgery.   Plan  1. IV iron with Dr Claire Kane  2. Continue PPI, stop zantac.   Dr Claire Bellows  MD,MRCP [facility]) Follow up PRN

## 2018-04-05 ENCOUNTER — Other Ambulatory Visit: Payer: Self-pay | Admitting: Oncology

## 2018-04-05 DIAGNOSIS — D509 Iron deficiency anemia, unspecified: Secondary | ICD-10-CM

## 2018-04-09 ENCOUNTER — Inpatient Hospital Stay: Payer: Medicare Other | Attending: Oncology

## 2018-04-09 ENCOUNTER — Other Ambulatory Visit: Payer: Self-pay

## 2018-04-09 DIAGNOSIS — D509 Iron deficiency anemia, unspecified: Secondary | ICD-10-CM | POA: Diagnosis not present

## 2018-04-09 LAB — CBC WITH DIFFERENTIAL/PLATELET
Basophils Absolute: 0.1 10*3/uL (ref 0–0.1)
Basophils Relative: 1 %
EOS ABS: 0.2 10*3/uL (ref 0–0.7)
Eosinophils Relative: 3 %
HEMATOCRIT: 41.1 % (ref 35.0–47.0)
Hemoglobin: 13.9 g/dL (ref 12.0–16.0)
Lymphocytes Relative: 23 %
Lymphs Abs: 1.9 10*3/uL (ref 1.0–3.6)
MCH: 29.6 pg (ref 26.0–34.0)
MCHC: 33.7 g/dL (ref 32.0–36.0)
MCV: 87.7 fL (ref 80.0–100.0)
MONOS PCT: 11 %
Monocytes Absolute: 0.9 10*3/uL (ref 0.2–0.9)
NEUTROS ABS: 5.2 10*3/uL (ref 1.4–6.5)
Neutrophils Relative %: 62 %
Platelets: 375 10*3/uL (ref 150–440)
RBC: 4.68 MIL/uL (ref 3.80–5.20)
RDW: 14.4 % (ref 11.5–14.5)
WBC: 8.3 10*3/uL (ref 3.6–11.0)

## 2018-04-09 LAB — IRON AND TIBC
Iron: 144 ug/dL (ref 28–170)
Saturation Ratios: 37 % — ABNORMAL HIGH (ref 10.4–31.8)
TIBC: 387 ug/dL (ref 250–450)
UIBC: 243 ug/dL

## 2018-04-09 LAB — FERRITIN: FERRITIN: 18 ng/mL (ref 11–307)

## 2018-04-11 ENCOUNTER — Encounter: Payer: Self-pay | Admitting: Oncology

## 2018-04-11 ENCOUNTER — Inpatient Hospital Stay (HOSPITAL_BASED_OUTPATIENT_CLINIC_OR_DEPARTMENT_OTHER): Payer: Medicare Other | Admitting: Oncology

## 2018-04-11 ENCOUNTER — Other Ambulatory Visit: Payer: Self-pay

## 2018-04-11 ENCOUNTER — Inpatient Hospital Stay: Payer: Medicare Other

## 2018-04-11 VITALS — BP 130/82 | HR 72 | Temp 97.2°F | Wt 195.6 lb

## 2018-04-11 DIAGNOSIS — D509 Iron deficiency anemia, unspecified: Secondary | ICD-10-CM | POA: Diagnosis not present

## 2018-04-11 DIAGNOSIS — Z86000 Personal history of in-situ neoplasm of breast: Secondary | ICD-10-CM

## 2018-04-11 MED ORDER — FERROUS SULFATE 325 (65 FE) MG PO TBEC: 325.0000 mg | DELAYED_RELEASE_TABLET | Freq: Two times a day (BID) | ORAL | 2 refills | Status: DC

## 2018-04-11 NOTE — Progress Notes (Signed)
Hematology/Oncology follow up  note [facility] [phone] [phone]   Patient Care Team: Jerrol Banana., MD as PCP - General (Family Medicine) Dingeldein, Remo Lipps, MD as Consulting Physician (Ophthalmology) Gae Dry, MD as Referring Physician (Obstetrics and Gynecology) Jonathon Bellows, MD as Consulting Physician (Gastroenterology) Earlie Server, MD as Consulting Physician (Oncology) Thomes Dinning, MD as Referring Physician (Internal Medicine)  REFERRING PROVIDER: Nilsa Nutting REASON FOR VISIT Evaluation of anemia  HISTORY OF PRESENTING ILLNESS:  Claire Kane is a  77 y.o.  female with PMH listed below who was referred to me for evaluation of anemia.  Patient reports she was told that she had iron deficiency 2 years ago and she took iron supplementation for a while and was told to stop. Recently found to to have worsening of anemia. She had Upper and lower endoscopy in 2017 by Dr.Sankar # Upper endoscopy 12/07/2017 Normal examined duodenum. - Large hiatal hernia. - Erythematous mucosa in the antrum. Biopsied. - The examination was otherwise normal # Colonoscopy on 12/07/2017  Non-thrombosed external hemorrhoids found on perianal exam. - Diverticulosis in the sigmoid colon and in the descending colon. - The examination was otherwise normal on direct and retroflexion views. - No specimens collected.  # Remote history of DCIS right breast in  2002, s/p surgery and RT. Finished 5 years of tamoxifen.   # April - May s/p upper and lower enoscropy, capsule study which did not reveal active bleeding source  Celiac panel positive.  Interval History 77 year old female with pertinent hematology history listed above reviewed by me today present for follow-up of iron deficiency anemia. s/p IV iron with Venofer 212m weekly x 5  During the interval, she has had capsule study which did not reveal any active bleeding source.  #Patient has  large hiatal hernia.  Chronic use of Zantac and omeprazole.  Recently Zantac was discontinued. Reports fatigue has improved.  Denies any shortness of breath, chest pain, abdominal pain. Reports right knee pain.  History of knee ostial arthritis.   Review of Systems  Constitutional: Positive for malaise/fatigue. Negative for chills, diaphoresis, fever and weight loss.  HENT: Negative for ear discharge, nosebleeds and sore throat.   Eyes: Negative for double vision, photophobia, discharge and redness.  Respiratory: Negative for cough, hemoptysis, sputum production, shortness of breath and wheezing.   Cardiovascular: Negative for chest pain, palpitations, orthopnea and claudication.  Gastrointestinal: Positive for heartburn. Negative for abdominal pain, blood in stool, constipation, nausea and vomiting.  Genitourinary: Negative for dysuria and urgency.  Musculoskeletal: Negative for back pain, myalgias and neck pain.  Skin: Negative for itching and rash.  Neurological: Negative for dizziness, tingling, tremors and sensory change.  Endo/Heme/Allergies: Negative for environmental allergies. Does not bruise/bleed easily.  Psychiatric/Behavioral: Negative for depression, substance abuse and suicidal ideas.    MEDICAL HISTORY:  Past Medical History:  Diagnosis Date  . Anemia   . Anxiety   . Arthritis   . 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 Breast cancer ([place] 2002   DCIS right breast   . Cataract   . GERD (gastroesophageal reflux disease)   . Heart murmur 2001  . Hiatal hernia   . High cholesterol   . Hypertension   . Personal history of radiation therapy 2002   BREAST CA  . Rosacea   . 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 complications     SURGICAL HISTORY: Past Surgical History:  Procedure Laterality Date  .  ABDOMINAL HYSTERECTOMY    . BREAST CYST ASPIRATION Left 2003  . BREAST EXCISIONAL BIOPSY Right 2002   POS  . BREAST SURGERY Right 2002   lumpectomy   . CHOLECYSTECTOMY    . COLONOSCOPY  2013  . COLONOSCOPY WITH PROPOFOL N/A 12/08/2015   Procedure: COLONOSCOPY WITH PROPOFOL;  Surgeon: Christene Lye, MD;  Location: [facility] ENDOSCOPY;  Service: Endoscopy;  Laterality: N/A;  . COLONOSCOPY WITH PROPOFOL N/A 12/06/2017   Procedure: COLONOSCOPY WITH PROPOFOL;  Surgeon: Jonathon Bellows, MD;  Location[facility] ENDOSCOPY;  Service: Gastroenterology;  Laterality: N/A;  . COLONOSCOPY WITH PROPOFOL N/A 12/22/2017   Procedure: COLONOSCOPY WITH PROPOFOL;  Surgeon: Jonathon Bellows, MD;  Location: [facility] ENDOSCOPY;  Service: Gastroenterology;  Laterality: N/A;  . ESOPHAGOGASTRODUODENOSCOPY (EGD) WITH PROPOFOL N/A 12/08/2015   Procedure: ESOPHAGOGASTRODUODENOSCOPY (EGD) WITH PROPOFOL;  Surgeon: Christene Lye, MD;  Location: [facility] ENDOSCOPY;  Service: Endoscopy;  Laterality: N/A;  . ESOPHAGOGASTRODUODENOSCOPY (EGD) WITH PROPOFOL N/A 12/06/2017   Procedure: ESOPHAGOGASTRODUODENOSCOPY (EGD) WITH PROPOFOL;  Surgeon: Jonathon Bellows, MD;  Location: [facility] ENDOSCOPY;  Service: Gastroenterology;  Laterality: N/A;  . ESOPHAGOGASTRODUODENOSCOPY (EGD) WITH PROPOFOL N/A 12/22/2017   Procedure: ESOPHAGOGASTRODUODENOSCOPY (EGD) WITH PROPOFOL;  Surgeon: Jonathon Bellows, MD;  Location: [facility] ENDOSCOPY;  Service: Gastroenterology;  Laterality: N/A;  . EYE SURGERY Right 2014  . GIVENS CAPSULE STUDY N/A 01/23/2018   Procedure: GIVENS CAPSULE STUDY;  Surgeon: Jonathon Bellows, MD;  Location: [facility] ENDOSCOPY;  Service: Gastroenterology;  Laterality: N/A;  . post radiation therapy Right    right breast cancer 2002  . Stab pheblectomy   2010  . vein closure procedure Bilateral 2008    SOCIAL HISTORY: Social History   Socioeconomic History  . Marital status: Married    Spouse name: Not on file  . Number of children: 2  . Years of education: Not on file  . Highest education level: Bachelor's degree (e.g., BA, AB, BS)  Occupational History  . Occupation: retired  Scientific laboratory technician  . Financial  resource strain: Not hard at all  . Food insecurity:    Worry: Never true    Inability: Never true  . Transportation needs:    Medical: No    Non-medical: No  Tobacco Use  . Smoking status: Never Smoker  . Smokeless tobacco: Never Used  Substance and Sexual Activity  . Alcohol use: No  . Drug use: No  . Sexual activity: Never  Lifestyle  . Physical activity:    Days per week: Not on file    Minutes per session: Not on file  . Stress: Only a little  Relationships  . Social connections:    Talks on phone: Not on file    Gets together: Not on file    Attends religious service: Not on file    Active member of club or organization: Not on file    Attends meetings of clubs or organizations: Not on file    Relationship status: Not on file  . Intimate partner violence:    Fear of current or ex partner: Not on file    Emotionally abused: Not on file    Physically abused: Not on file    Forced sexual activity: Not on file  Other Topics Concern  . Not on file  Social History Narrative  . Not on file    FAMILY HISTORY: Family History  Problem Relation Age of Onset  . Heart attack Mother   . Heart attack Father   . Heart attack Sister   . Stroke  Sister   . Heart attack Brother   . Other Brother        cerebral hemorrhage, sepsis  . Breast cancer Cousin 28       breast/maternal  . Cancer Cousin 75       breast/maternal  . Breast cancer Maternal Aunt 70  . Breast cancer Other 48       maternal neice    ALLERGIES:  has No Known Allergies.  MEDICATIONS:  Current Outpatient Medications  Medication Sig Dispense Refill  . atorvastatin (LIPITOR) 10 MG tablet Take 10 mg by mouth daily.    . calcium citrate (CALCITRATE - DOSED IN MG ELEMENTAL CALCIUM) 950 MG tablet Take 200 mg of elemental calcium by mouth 2 (two) times daily.     Marland Kitchen docusate sodium (STOOL SOFTENER) 100 MG capsule Take 100 mg by mouth daily as needed for mild constipation.    Marland Kitchen ezetimibe (ZETIA) 10 MG tablet  Take 10 mg by mouth daily.    . Glucosamine 750 MG TABS Take by mouth daily.    . Multiple Vitamins-Minerals (MULTIVITAMIN WITH MINERALS) tablet Take 1 tablet by mouth daily.    Marland Kitchen omeprazole (PRILOSEC) 20 MG capsule TAKE 1 CAPSULE BY MOUTH  DAILY 90 capsule 3  . ORACEA 40 MG capsule PRN    . psyllium (REGULOID) 0.52 G capsule Take 0.52 g by mouth daily.    Marland Kitchen spironolactone (ALDACTONE) 50 MG tablet 50 mg 2 (two) times daily.     . traMADol (ULTRAM) 50 MG tablet TAKE 1 TABLET BY MOUTH  EVERY 6 HOURS AS NEEDED (Patient taking differently: TAKE 1 TABLET BY MOUTH  EVERY 6 HOURS AS NEEDED (currently taking 1 a day)) 100 tablet 3  . TURMERIC PO Take by mouth daily.    [name] 625 MG tablet Take 1 tablet by mouth 3 (three) times daily.     . ferrous sulfate 325 (65 FE) MG EC tablet Take 1 tablet (325 mg total) by mouth 2 (two) times daily with a meal. 60 tablet 2  . SOOLANTRA 1 % CREA      No current facility-administered medications for this visit.      PHYSICAL EXAMINATION: ECOG PERFORMANCE STATUS: 0 - Asymptomatic Vitals:   04/11/18 1353  BP: 130/82  Pulse: 72  Temp: (!) 97.2 F (36.2 C)   Filed Weights   04/11/18 1353  Weight: 195 lb 9.6 oz (88.7 kg)    Physical Exam  Constitutional: She is oriented to person, place, and time and well-developed, well-nourished, and in no distress. No distress.  HENT:  Head: Normocephalic and atraumatic.  Nose: Nose normal.  Mouth/Throat: Oropharynx is clear and moist. No oropharyngeal exudate.  Eyes: Pupils are equal, round, and reactive to light. Conjunctivae and EOM are normal. Right eye exhibits no discharge. Left eye exhibits no discharge. No scleral icterus.  Neck: Normal range of motion. Neck supple.  Cardiovascular: Normal rate, regular rhythm and normal heart sounds.  No murmur heard. Pulmonary/Chest: Effort normal and breath sounds normal. No respiratory distress. She has no wheezes. She has no rales. She exhibits no tenderness.   Abdominal: Soft. Bowel sounds are normal. She exhibits no distension and no mass. There is no tenderness. There is no guarding.  Musculoskeletal: Normal range of motion. She exhibits no edema.  Lymphadenopathy:    She has no cervical adenopathy.  Neurological: She is alert and oriented to person, place, and time. No cranial nerve deficit. She exhibits normal muscle tone. Coordination normal. GCS  score is 15.  Skin: Skin is warm and dry. She is not diaphoretic. No erythema.  Psychiatric: Memory, affect and judgment normal.     LABORATORY DATA:  I have reviewed the data as listed Lab Results  Component Value Date   WBC 8.3 04/09/2018   HGB 13.9 04/09/2018   HCT 41.1 04/09/2018   MCV 87.7 04/09/2018   PLT 375 04/09/2018   Recent Labs    10/20/17 0919 11/14/17 1352  NA 146* 141  K 4.2 4.2  CL 107* 104  CO2 21 20  GLUCOSE 97 142*  BUN 10 9  CREATININE 0.86 0.82  CALCIUM 9.0 9.1  GFRNONAA 66 70  GFRAA 76 80  PROT 6.9 6.9  ALBUMIN 4.6 4.3  AST 14 17  ALT 12 14  ALKPHOS 78 75  BILITOT 0.8 0.6    Iron/TIBC/Ferritin/ %Sat    Component Value Date/Time   IRON 144 04/09/2018 1034   IRON 209 (H) 11/29/2017 1512   TIBC 387 04/09/2018 1034   TIBC 374 11/29/2017 1512   FERRITIN 18 04/09/2018 1034   FERRITIN 14 (L) 11/29/2017 1512   IRONPCTSAT 37 (H) 04/09/2018 1034   IRONPCTSAT 56 (H) 11/29/2017 1512     ASSESSMENT & PLAN:  Patient is a 77 year old female present for evaluation of iron deficiency anemia. 1. Iron deficiency anemia, unspecified iron deficiency anemia type   2. History of ductal carcinoma in situ (DCIS) of breast     #Labs reviewed and discussed with patient. Her hemoglobin has normalized. Iron panel reviewed.  Ferritin is 18, decreased from previous.  Iron saturation was 37. Discussed with patient that current iron panel is acceptable.  She has had extensive GI work-up without finding of bleeding source. I recommend patient to start ferrous sulfate 325  mg twice daily with meals.  She may use stool softener as needed if she develops constipation. Recommend repeat CBC, TIBC ferritin iron in 3 months. Hold additional IV iron for now.  # Family history of breast cancer, personal history of DCIS. Patient reports that Dr.Sankar has sent some genetic testing in the past and she does not have " breast cancer gene" discussed with patient.   All questions were answered. The patient knows to call the clinic with any problems questions or concerns.  Return of visit: 3 months.     Earlie Server, MD, PhD Hematology Oncology [facility] at [facility] Pager- [phone] 04/11/2018

## 2018-04-11 NOTE — Progress Notes (Signed)
Patient here for follow up

## 2018-04-13 ENCOUNTER — Encounter: Payer: Self-pay | Admitting: Obstetrics & Gynecology

## 2018-04-13 ENCOUNTER — Ambulatory Visit: Payer: Medicare Other | Admitting: Obstetrics & Gynecology

## 2018-04-13 ENCOUNTER — Telehealth: Payer: Self-pay | Admitting: Oncology

## 2018-04-13 VITALS — BP 158/80 | Ht 64.0 in | Wt 196.0 lb

## 2018-04-13 DIAGNOSIS — R3915 Urgency of urination: Secondary | ICD-10-CM

## 2018-04-13 DIAGNOSIS — N8110 Cystocele, unspecified: Secondary | ICD-10-CM | POA: Diagnosis not present

## 2018-04-13 DIAGNOSIS — N8111 Cystocele, midline: Secondary | ICD-10-CM

## 2018-04-13 NOTE — Progress Notes (Signed)
HPI:      [name] is a 77 y.o. [id number] who presents today for her pessary follow up and examination related to her pelvic floor weakening.  Pt reports tolerating the pessary well with  no vaginal bleeding and  no vaginal discharge.  Symptoms of pelvic floor weakening have greatly improved. She is voiding and defecating without difficulty.  Treated for UTI and has improved but still has urge and frequency.  She currently has a Cup#4 pessary.  PMHx: She  has a past medical history of Anemia, Anxiety, Arthritis, BRCA negative (08/04/2015), Breast cancer ([place]) (2002), Cataract, GERD (gastroesophageal reflux disease), Heart murmur (2001), Hiatal hernia, High cholesterol, Hypertension, Personal history of radiation therapy (2002), Rosacea, and 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 complications. Also,  has a past surgical history that includes Stab pheblectomy  (2010); vein closure procedure (Bilateral, 2008); Abdominal hysterectomy; Cholecystectomy; Breast surgery (Right, 2002); Eye surgery (Right, 2014); Colonoscopy (2013); post radiation therapy (Right); Colonoscopy with propofol (N/A, 12/08/2015); Esophagogastroduodenoscopy (egd) with propofol (N/A, 12/08/2015); Breast excisional biopsy (Right, 2002); Breast cyst aspiration (Left, 2003); Colonoscopy with propofol (N/A, 12/06/2017); Esophagogastroduodenoscopy (egd) with propofol (N/A, 12/06/2017); Colonoscopy with propofol (N/A, 12/22/2017); Esophagogastroduodenoscopy (egd) with propofol (N/A, 12/22/2017); and [place] capsule study (N/A, 01/23/2018)., family history includes Breast cancer (age of onset: 24) in her other; Breast cancer (age of onset: 50) in her cousin; Breast cancer (age of onset: 37) in her maternal aunt; Cancer (age of onset: 43) in her cousin; Heart attack in her brother, father, mother, and sister; Other in her brother; Stroke in her sister.,  reports that she has never smoked. She has never used smokeless tobacco. She reports that she  does not drink alcohol or use drugs.  She has a current medication list which includes the following prescription(s): atorvastatin, calcium citrate, docusate sodium, ezetimibe, ferrous sulfate, glucosamine, multivitamin with minerals, omeprazole, oracea, psyllium, soolantra, spironolactone, tramadol, turmeric, and welchol. Also, has No Known Allergies.  Review of Systems  Constitutional: Negative for chills, fever and malaise/fatigue.  HENT: Negative for congestion, sinus pain and sore throat.   Eyes: Negative for blurred vision and pain.  Respiratory: Negative for cough and wheezing.   Cardiovascular: Negative for chest pain and leg swelling.  Gastrointestinal: Negative for abdominal pain, constipation, diarrhea, heartburn, nausea and vomiting.  Genitourinary: Positive for frequency and urgency. Negative for dysuria and hematuria.  Musculoskeletal: Negative for back pain, joint pain, myalgias and neck pain.  Skin: Negative for itching and rash.  Neurological: Negative for dizziness, tremors and weakness.  Endo/Heme/Allergies: Does not bruise/bleed easily.  Psychiatric/Behavioral: Negative for depression. The patient is not nervous/anxious and does not have insomnia.    Objective: BP (!) 158/80   Ht 5' 4" (1.626 m)   Wt 196 lb (88.9 kg)   BMI 33.64 kg/m  Physical Exam  Constitutional: She is oriented to person, place, and time. She appears well-developed and well-nourished. No distress.  Genitourinary: Vagina normal. Pelvic exam was performed with patient supine. There is no rash, tenderness or lesion on the right labia. There is no rash, tenderness or lesion on the left labia. No erythema or bleeding in the vagina.  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  Nose: Nose normal.  Mouth/Throat: Oropharynx is clear and moist.  Abdominal: Soft. She exhibits no distension. There  is no tenderness.  Musculoskeletal: Normal range of motion.  Neurological: She is alert and  oriented to person, place, and time. No cranial nerve deficit.  Skin: Skin is warm and dry.  Psychiatric: She has a normal mood and affect.    Pessary Care Pessary removed and cleaned.  Vagina checked - without erosions - pessary replaced.  A/P:  1. Vaginal prolapse 2. Cystocele, midline 3. Urinary urgency Monitor sx's and consider therapy (Detrol, Myrbetriq) UA neg today  Pessary was cleaned and replaced today. Instructions given for care. Concerning symptoms to observe for are counseled to patient. Follow up scheduled for 4 months.  A total of 15 minutes were spent face-to-face with the patient during this encounter and over half of that time dealt with counseling and coordination of care.  Barnett Applebaum, MD, [facility], [facility] 04/13/2018  11:13 AM

## 2018-04-13 NOTE — Telephone Encounter (Signed)
Per Dr. Tasia Catchings, patient has extensive history of [place] cancer and wanted to see if she would agree to genetic testing. Left voicemail to notify patient.

## 2018-04-23 ENCOUNTER — Other Ambulatory Visit: Payer: Self-pay | Admitting: *Deleted

## 2018-04-23 MED ORDER — FERROUS SULFATE 325 (65 FE) MG PO TBEC: 325.0000 mg | DELAYED_RELEASE_TABLET | Freq: Two times a day (BID) | ORAL | 2 refills | Status: DC

## 2018-05-10 ENCOUNTER — Ambulatory Visit: Payer: Medicare Other | Admitting: Family Medicine

## 2018-05-10 ENCOUNTER — Ambulatory Visit
Admission: RE | Admit: 2018-05-10 | Discharge: 2018-05-10 | Disposition: A | Discharge status: Home / Self Care | Payer: Medicare Other | Source: Ambulatory Visit | Attending: Family Medicine | Admitting: Family Medicine

## 2018-05-10 ENCOUNTER — Encounter: Payer: Self-pay | Admitting: Family Medicine

## 2018-05-10 VITALS — BP 126/78 | HR 78 | Temp 99.7°F | Resp 16 | Ht 64.0 in | Wt 195.0 lb

## 2018-05-10 DIAGNOSIS — M25561 Pain in right knee: Secondary | ICD-10-CM

## 2018-05-10 DIAGNOSIS — K219 Gastro-esophageal reflux disease without esophagitis: Secondary | ICD-10-CM

## 2018-05-10 DIAGNOSIS — M1711 Unilateral primary osteoarthritis, right knee: Secondary | ICD-10-CM | POA: Diagnosis not present

## 2018-05-10 DIAGNOSIS — M79604 Pain in right leg: Secondary | ICD-10-CM | POA: Diagnosis not present

## 2018-05-10 DIAGNOSIS — R350 Frequency of micturition: Secondary | ICD-10-CM

## 2018-05-10 DIAGNOSIS — D649 Anemia, unspecified: Secondary | ICD-10-CM | POA: Diagnosis not present

## 2018-05-10 DIAGNOSIS — E785 Hyperlipidemia, unspecified: Secondary | ICD-10-CM

## 2018-05-10 NOTE — Patient Instructions (Signed)
Try over the counter Tumeric daily.

## 2018-05-10 NOTE — Progress Notes (Signed)
Patient: Claire Kane Female    DOB: 04-26-1941   77 y.o.   MRN: [id number] Visit Date: 05/10/2018  Today's Provider: Wilhemena Durie, MD   Chief Complaint  Patient presents with  . Anemia   Subjective:    HPI Patient comes in today for a follow up. She was last seen in the office 2 months ago. Since last visit, she was advised to stop Zantac, then stop Prilosec 1 month later. Patient reports that she is still taking Prilosec.  She has finished complete GI w/u  She is having significant knee pain with weight bearing .She knows she cannot take NSAIDs. She feels as though she has a low grade fever for the past day.    No Known Allergies   Current Outpatient Medications:  .  atorvastatin (LIPITOR) 10 MG tablet, Take 10 mg by mouth daily., Disp: , Rfl:  .  calcium citrate (CALCITRATE - DOSED IN MG ELEMENTAL CALCIUM) 950 MG tablet, Take 200 mg of elemental calcium by mouth 2 (two) times daily. , Disp: , Rfl:  .  docusate sodium (STOOL SOFTENER) 100 MG capsule, Take 100 mg by mouth daily as needed for mild constipation., Disp: , Rfl:  .  ezetimibe (ZETIA) 10 MG tablet, Take 10 mg by mouth daily., Disp: , Rfl:  .  ferrous sulfate 325 (65 FE) MG EC tablet, Take 1 tablet (325 mg total) by mouth 2 (two) times daily with a meal., Disp: 60 tablet, Rfl: 2 .  Glucosamine 750 MG TABS, Take by mouth daily., Disp: , Rfl:  .  Multiple Vitamins-Minerals (MULTIVITAMIN WITH MINERALS) tablet, Take 1 tablet by mouth daily., Disp: , Rfl:  .  omeprazole (PRILOSEC) 20 MG capsule, TAKE 1 CAPSULE BY MOUTH  DAILY, Disp: 90 capsule, Rfl: 3 .  ORACEA 40 MG capsule, PRN, Disp: , Rfl:  .  psyllium (REGULOID) 0.52 G capsule, Take 0.52 g by mouth daily., Disp: , Rfl:  .  SOOLANTRA 1 % CREA, , Disp: , Rfl:  .  spironolactone (ALDACTONE) 50 MG tablet, 50 mg 2 (two) times daily. , Disp: , Rfl:  .  TURMERIC PO, Take by mouth daily., Disp: , Rfl:  .  WELCHOL 625 MG tablet, Take 1 tablet by mouth 3 (three)  times daily. , Disp: , Rfl:  .  traMADol (ULTRAM) 50 MG tablet, TAKE 1 TABLET BY MOUTH  EVERY 6 HOURS AS NEEDED (Patient not taking: Reported on 05/10/2018), Disp: 100 tablet, Rfl: 3  Review of Systems  Constitutional: Positive for fatigue.  HENT: Positive for congestion and ear pain.   Eyes: Negative.   Respiratory: Negative.   Cardiovascular: Negative for chest pain, palpitations and leg swelling.  Endocrine: Negative for cold intolerance, heat intolerance, polydipsia, polyphagia and polyuria.  Genitourinary: Negative.   Musculoskeletal: Positive for arthralgias, back pain, joint swelling and myalgias.  Skin: Negative.   Allergic/Immunologic: Negative.   Neurological: Negative for dizziness, weakness, numbness and headaches.  Psychiatric/Behavioral: Negative.     Social History   Tobacco Use  . Smoking status: Never Smoker  . Smokeless tobacco: Never Used  Substance Use Topics  . Alcohol use: No   Objective:   BP 126/78 (BP Location: Left Arm, Patient Position: Sitting, Cuff Size: Large)   Pulse 78   Temp 99.7 F (37.6 C)   Resp 16   Ht 5\' 4"  (1.626 m)   Wt 195 lb (88.5 kg)   SpO2 98%   BMI 33.47 kg/m  Vitals:   05/10/18 1051  BP: 126/78  Pulse: 78  Resp: 16  Temp: 99.7 F (37.6 C)  SpO2: 98%  Weight: 195 lb (88.5 kg)  Height: 5\' 4"  (1.626 m)     Physical Exam  Constitutional: She is oriented to person, place, and time. She appears well-developed and well-nourished.  HENT:  Head: Normocephalic and atraumatic.  Right Ear: External ear normal.  Left Ear: External ear normal.  Nose: Nose normal.  Eyes: Conjunctivae are normal. No scleral icterus.  Neck: No thyromegaly present.  Cardiovascular: Normal rate, regular rhythm and normal heart sounds.  Pulmonary/Chest: Effort normal and breath sounds normal.  Abdominal: Soft.  Musculoskeletal: She exhibits no edema.  Lymphadenopathy:    She has no cervical adenopathy.  Neurological: She is alert and oriented  to person, place, and time.  Skin: Skin is warm and dry.  Psychiatric: She has a normal mood and affect. Her behavior is normal. Judgment and thought content normal.        Assessment & Plan:     1. Anemia, unspecified type  - CBC with Differential/Platelet  2. Gastroesophageal reflux disease without esophagitis   3. Urinary frequency  - Urine Culture  4. Pain of right lower extremity Pain c/w knee OA. May need ortho referral.Try OTC tumeric--no NSAIDs.  5. Acute pain of right knee  - DG Knee Complete 4 Views Right; Future  6. Dyslipidemia  - Comprehensive metabolic panel      I have done the exam and reviewed the above chart and it is accurate to the best of my knowledge. Development worker, community has been used in this note in any air is in the dictation or transcription are unintentional.  Wilhemena Durie, MD  [place]

## 2018-05-11 LAB — CBC WITH DIFFERENTIAL/PLATELET
BASOS ABS: 0 10*3/uL (ref 0.0–0.2)
Basos: 0 %
EOS (ABSOLUTE): 0.2 10*3/uL (ref 0.0–0.4)
Eos: 2 %
Hematocrit: 43 % (ref 34.0–46.6)
Hemoglobin: 13.7 g/dL (ref 11.1–15.9)
Immature Grans (Abs): 0 10*3/uL (ref 0.0–0.1)
Immature Granulocytes: 0 %
LYMPHS ABS: 2.3 10*3/uL (ref 0.7–3.1)
LYMPHS: 27 %
MCH: 29.8 pg (ref 26.6–33.0)
MCHC: 31.9 g/dL (ref 31.5–35.7)
MCV: 94 fL (ref 79–97)
MONOS ABS: 0.7 10*3/uL (ref 0.1–0.9)
Monocytes: 8 %
Neutrophils Absolute: 5.4 10*3/uL (ref 1.4–7.0)
Neutrophils: 63 %
PLATELETS: 396 10*3/uL (ref 150–450)
RBC: 4.6 x10E6/uL (ref 3.77–5.28)
RDW: 14.3 % (ref 12.3–15.4)
WBC: 8.7 10*3/uL (ref 3.4–10.8)

## 2018-05-11 LAB — COMPREHENSIVE METABOLIC PANEL
A/G RATIO: 1.8 (ref 1.2–2.2)
ALK PHOS: 68 IU/L (ref 39–117)
ALT: 22 IU/L (ref 0–32)
AST: 25 IU/L (ref 0–40)
Albumin: 4.4 g/dL (ref 3.5–4.8)
BILIRUBIN TOTAL: 1 mg/dL (ref 0.0–1.2)
BUN/Creatinine Ratio: 14 (ref 12–28)
BUN: 11 mg/dL (ref 8–27)
CHLORIDE: 103 mmol/L (ref 96–106)
CO2: 21 mmol/L (ref 20–29)
Calcium: 9.6 mg/dL (ref 8.7–10.3)
Creatinine, Ser: 0.79 mg/dL (ref 0.57–1.00)
GFR calc Af Amer: 84 mL/min/{1.73_m2} (ref >59)
GFR calc non Af Amer: 73 mL/min/{1.73_m2} (ref >59)
GLUCOSE: 88 mg/dL (ref 65–99)
Globulin, Total: 2.5 g/dL (ref 1.5–4.5)
POTASSIUM: 4.6 mmol/L (ref 3.5–5.2)
Sodium: 141 mmol/L (ref 134–144)
Total Protein: 6.9 g/dL (ref 6.0–8.5)

## 2018-05-12 LAB — URINE CULTURE: ORGANISM ID, BACTERIA: NO GROWTH

## 2018-05-15 ENCOUNTER — Telehealth: Payer: Self-pay

## 2018-05-15 NOTE — Telephone Encounter (Signed)
Pt advised.   Thanks,   -Mizael Sagar  

## 2018-05-15 NOTE — Telephone Encounter (Signed)
-----   Message from Jerrol Banana., MD sent at 05/15/2018  8:25 AM EDT ----- Labs good--urine normal.

## 2018-06-15 ENCOUNTER — Other Ambulatory Visit: Payer: Self-pay

## 2018-06-15 DIAGNOSIS — Z1231 Encounter for screening mammogram for malignant neoplasm of breast: Secondary | ICD-10-CM

## 2018-06-29 ENCOUNTER — Encounter: Payer: Self-pay | Admitting: Family Medicine

## 2018-07-12 ENCOUNTER — Encounter: Payer: Self-pay | Admitting: Oncology

## 2018-07-12 ENCOUNTER — Inpatient Hospital Stay: Payer: Medicare Other

## 2018-07-12 ENCOUNTER — Inpatient Hospital Stay: Payer: Medicare Other | Attending: Oncology | Admitting: Oncology

## 2018-07-12 ENCOUNTER — Other Ambulatory Visit: Payer: Self-pay

## 2018-07-12 VITALS — BP 127/87 | HR 81 | Temp 96.7°F | Resp 18 | Wt 182.7 lb

## 2018-07-12 DIAGNOSIS — D509 Iron deficiency anemia, unspecified: Secondary | ICD-10-CM | POA: Diagnosis not present

## 2018-07-12 DIAGNOSIS — Z803 Family history of malignant neoplasm of breast: Secondary | ICD-10-CM | POA: Diagnosis not present

## 2018-07-12 DIAGNOSIS — Z86000 Personal history of in-situ neoplasm of breast: Secondary | ICD-10-CM | POA: Insufficient documentation

## 2018-07-12 LAB — IRON AND TIBC
IRON: 86 ug/dL (ref 28–170)
Saturation Ratios: 26 % (ref 10.4–31.8)
TIBC: 328 ug/dL (ref 250–450)
UIBC: 242 ug/dL

## 2018-07-12 LAB — CBC WITH DIFFERENTIAL/PLATELET
Abs Immature Granulocytes: 0.01 10*3/uL (ref 0.00–0.07)
BASOS ABS: 0 10*3/uL (ref 0.0–0.1)
BASOS PCT: 0 %
EOS PCT: 2 %
Eosinophils Absolute: 0.1 10*3/uL (ref 0.0–0.5)
HCT: 42.2 % (ref 36.0–46.0)
Hemoglobin: 13.9 g/dL (ref 12.0–15.0)
Immature Granulocytes: 0 %
Lymphocytes Relative: 26 %
Lymphs Abs: 2.1 10*3/uL (ref 0.7–4.0)
MCH: 29.4 pg (ref 26.0–34.0)
MCHC: 32.9 g/dL (ref 30.0–36.0)
MCV: 89.4 fL (ref 80.0–100.0)
MONO ABS: 0.7 10*3/uL (ref 0.1–1.0)
Monocytes Relative: 9 %
NEUTROS ABS: 5.1 10*3/uL (ref 1.7–7.7)
NRBC: 0 % (ref 0.0–0.2)
Neutrophils Relative %: 63 %
PLATELETS: 341 10*3/uL (ref 150–400)
RBC: 4.72 MIL/uL (ref 3.87–5.11)
RDW: 12.7 % (ref 11.5–15.5)
WBC: 8 10*3/uL (ref 4.0–10.5)

## 2018-07-12 LAB — FERRITIN: Ferritin: 34 ng/mL (ref 11–307)

## 2018-07-12 NOTE — Progress Notes (Signed)
Patient here for follow up. No concerns voiced.  °

## 2018-07-13 NOTE — Progress Notes (Signed)
Hematology/Oncology follow up  note [facility] Telephone:([phone]) [phone] [phone]) [phone]   Patient Care Team: Jerrol Banana., MD as PCP - General (Family Medicine) Dingeldein, Remo Lipps, MD as Consulting Physician (Ophthalmology) Gae Dry, MD as Referring Physician (Obstetrics and Gynecology) Jonathon Bellows, MD as Consulting Physician (Gastroenterology) Earlie Server, MD as Consulting Physician (Oncology) Thomes Dinning, MD as Referring Physician (Internal Medicine)  REFERRING PROVIDER: Nilsa Nutting REASON FOR VISIT Evaluation of anemia  HISTORY OF PRESENTING ILLNESS:  Claire Kane is a  77 y.o.  female with PMH listed below who was referred to me for evaluation of anemia.  Patient reports she was told that she had iron deficiency 2 years ago and she took iron supplementation for a while and was told to stop. Recently found to to have worsening of anemia. She had Upper and lower endoscopy in 2017 by Dr.Sankar # Upper endoscopy 12/07/2017 Normal examined duodenum. - Large hiatal hernia. - Erythematous mucosa in the antrum. Biopsied. - The examination was otherwise normal # Colonoscopy on 12/07/2017  Non-thrombosed external hemorrhoids found on perianal exam. - Diverticulosis in the sigmoid colon and in the descending colon. - The examination was otherwise normal on direct and retroflexion views. - No specimens collected. #  capsule study which did not reveal any active bleeding source.  # Remote history of DCIS right breast in  2002, s/p surgery and RT. Finished 5 years of tamoxifen.   # April - May s/p upper and lower enoscropy, capsule study which did not reveal active bleeding source  Celiac panel positive.  Interval History 77 year old female with pertinent hematology history listed above reviewed by me today presents for follow-up for iron deficiency anemia.  Previously she has received IV Venofer. Patient has previously underwent extensive  gastroenterology work-up including EGD, colonoscopy, capsule study which did not reveal active bleeding source. Was considered and may be iron deficiency was secondary to large hiatal hernia.  Patient currently using omeprazole.  Zantac has been discontinued. Reports that fatigue has improved.  Denies any shortness of breath, chest pain, abdominal pain,   Review of Systems  Constitutional: Positive for malaise/fatigue. Negative for chills, diaphoresis, fever and weight loss.  HENT: Negative for ear discharge, nosebleeds and sore throat.   Eyes: Negative for double vision, photophobia, discharge and redness.  Respiratory: Negative for cough, hemoptysis, sputum production, shortness of breath and wheezing.   Cardiovascular: Negative for chest pain, palpitations, orthopnea and claudication.  Gastrointestinal: Positive for heartburn. Negative for abdominal pain, blood in stool, constipation, nausea and vomiting.  Genitourinary: Negative for dysuria and urgency.  Musculoskeletal: Negative for back pain, myalgias and neck pain.  Skin: Negative for itching and rash.  Neurological: Negative for dizziness, tingling, tremors and sensory change.  Endo/Heme/Allergies: Negative for environmental allergies. Does not bruise/bleed easily.  Psychiatric/Behavioral: Negative for depression, substance abuse and suicidal ideas.    MEDICAL HISTORY:  Past Medical History:  Diagnosis Date  . Anemia   . Anxiety   . Arthritis   . 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 Breast cancer ([place]) 2002   DCIS right breast   . Cataract   . GERD (gastroesophageal reflux disease)   . Heart murmur 2001  . Hiatal hernia   . High cholesterol   . Hypertension   . Personal history of radiation therapy 2002   BREAST CA  . Rosacea   . 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 complications     SURGICAL HISTORY:  Past Surgical History:  Procedure Laterality Date  . ABDOMINAL HYSTERECTOMY    . BREAST CYST  ASPIRATION Left 2003  . BREAST EXCISIONAL BIOPSY Right 2002   POS  . BREAST SURGERY Right 2002   lumpectomy  . CHOLECYSTECTOMY    . COLONOSCOPY  2013  . COLONOSCOPY WITH PROPOFOL N/A 12/08/2015   Procedure: COLONOSCOPY WITH PROPOFOL;  Surgeon: Christene Lye, MD;  Location: [facility] ENDOSCOPY;  Service: Endoscopy;  Laterality: N/A;  . COLONOSCOPY WITH PROPOFOL N/A 12/06/2017   Procedure: COLONOSCOPY WITH PROPOFOL;  Surgeon: Jonathon Bellows, MD;  Location: [facility] ENDOSCOPY;  Service: Gastroenterology;  Laterality: N/A;  . COLONOSCOPY WITH PROPOFOL N/A 12/22/2017   Procedure: COLONOSCOPY WITH PROPOFOL;  Surgeon: Jonathon Bellows, MD;  Location[facility] ENDOSCOPY;  Service: Gastroenterology;  Laterality: N/A;  . ESOPHAGOGASTRODUODENOSCOPY (EGD) WITH PROPOFOL N/A 12/08/2015   Procedure: ESOPHAGOGASTRODUODENOSCOPY (EGD) WITH PROPOFOL;  Surgeon: Christene Lye, MD;  Location: [facility] ENDOSCOPY;  Service: Endoscopy;  Laterality: N/A;  . ESOPHAGOGASTRODUODENOSCOPY (EGD) WITH PROPOFOL N/A 12/06/2017   Procedure: ESOPHAGOGASTRODUODENOSCOPY (EGD) WITH PROPOFOL;  Surgeon: Jonathon Bellows, MD;  Location: [facility] ENDOSCOPY;  Service: Gastroenterology;  Laterality: N/A;  . ESOPHAGOGASTRODUODENOSCOPY (EGD) WITH PROPOFOL N/A 12/22/2017   Procedure: ESOPHAGOGASTRODUODENOSCOPY (EGD) WITH PROPOFOL;  Surgeon: Jonathon Bellows, MD;  Location: [facility] ENDOSCOPY;  Service: Gastroenterology;  Laterality: N/A;  . EYE SURGERY Right 2014  . GIVENS CAPSULE STUDY N/A 01/23/2018   Procedure: GIVENS CAPSULE STUDY;  Surgeon: Jonathon Bellows, MD;  Location: [facility] ENDOSCOPY;  Service: Gastroenterology;  Laterality: N/A;  . post radiation therapy Right    right breast cancer 2002  . Stab pheblectomy   2010  . vein closure procedure Bilateral 2008    SOCIAL HISTORY: Social History   Socioeconomic History  . Marital status: Married    Spouse name: Not on file  . Number of children: 2  . Years of education: Not on file  . Highest education level:  Bachelor's degree (e.g., BA, AB, BS)  Occupational History  . Occupation: retired  Scientific laboratory technician  . Financial resource strain: Not hard at all  . Food insecurity:    Worry: Never true    Inability: Never true  . Transportation needs:    Medical: No    Non-medical: No  Tobacco Use  . Smoking status: Never Smoker  . Smokeless tobacco: Never Used  Substance and Sexual Activity  . Alcohol use: No  . Drug use: No  . Sexual activity: Never  Lifestyle  . Physical activity:    Days per week: Not on file    Minutes per session: Not on file  . Stress: Only a little  Relationships  . Social connections:    Talks on phone: Not on file    Gets together: Not on file    Attends religious service: Not on file    Active member of club or organization: Not on file    Attends meetings of clubs or organizations: Not on file    Relationship status: Not on file  . Intimate partner violence:    Fear of current or ex partner: Not on file    Emotionally abused: Not on file    Physically abused: Not on file    Forced sexual activity: Not on file  Other Topics Concern  . Not on file  Social History Narrative  . Not on file    FAMILY HISTORY: Family History  Problem Relation Age of Onset  . Heart attack Mother   . Heart attack Father   .  Heart attack Sister   . Stroke Sister   . Heart attack Brother   . Other Brother        cerebral hemorrhage, sepsis  . Breast cancer Cousin 21       breast/maternal  . Cancer Cousin 19       breast/maternal  . Breast cancer Maternal Aunt 70  . Breast cancer Other 48       maternal neice    ALLERGIES:  has No Known Allergies.  MEDICATIONS:  Current Outpatient Medications  Medication Sig Dispense Refill  . atorvastatin (LIPITOR) 10 MG tablet Take 10 mg by mouth daily.    . calcium citrate (CALCITRATE - DOSED IN MG ELEMENTAL CALCIUM) 950 MG tablet Take 200 mg of elemental calcium by mouth 2 (two) times daily.     Marland Kitchen docusate sodium (STOOL SOFTENER)  100 MG capsule Take 100 mg by mouth daily as needed for mild constipation.    Marland Kitchen ezetimibe (ZETIA) 10 MG tablet Take 10 mg by mouth daily.    . ferrous sulfate 325 (65 FE) MG EC tablet Take 1 tablet (325 mg total) by mouth 2 (two) times daily with a meal. 60 tablet 2  . Glucosamine 750 MG TABS Take by mouth daily.    . Multiple Vitamins-Minerals (MULTIVITAMIN WITH MINERALS) tablet Take 1 tablet by mouth daily.    Marland Kitchen omeprazole (PRILOSEC) 20 MG capsule TAKE 1 CAPSULE BY MOUTH  DAILY 90 capsule 3  . ORACEA 40 MG capsule PRN    . psyllium (REGULOID) 0.52 G capsule Take 0.52 g by mouth daily.    Marland Kitchen spironolactone (ALDACTONE) 50 MG tablet 50 mg 2 (two) times daily.     . traMADol (ULTRAM) 50 MG tablet TAKE 1 TABLET BY MOUTH  EVERY 6 HOURS AS NEEDED 100 tablet 3  . TURMERIC PO Take by mouth daily.    . metroNIDAZOLE (METROGEL) 0.75 % gel APPLY TO AFFECTED AREA ON FACE BID  3  . SOOLANTRA 1 % CREA     . WELCHOL 625 MG tablet Take 1 tablet by mouth 3 (three) times daily.      No current facility-administered medications for this visit.      PHYSICAL EXAMINATION: ECOG PERFORMANCE STATUS: 0 - Asymptomatic Vitals:   07/12/18 1329  BP: 127/87  Pulse: 81  Resp: 18  Temp: (!) 96.7 F (35.9 C)   Filed Weights   07/12/18 1329  Weight: 182 lb 11.2 oz (82.9 kg)    Physical Exam  Constitutional: She is oriented to person, place, and time. No distress.  Obese  HENT:  Head: Normocephalic and atraumatic.  Nose: Nose normal.  Mouth/Throat: Oropharynx is clear and moist. No oropharyngeal exudate.  Eyes: Pupils are equal, round, and reactive to light. Conjunctivae and EOM are normal. Right eye exhibits no discharge. Left eye exhibits no discharge. No scleral icterus.  Neck: Normal range of motion. Neck supple.  Cardiovascular: Normal rate, regular rhythm and normal heart sounds.  No murmur heard. Pulmonary/Chest: Effort normal and breath sounds normal. No respiratory distress. She has no wheezes.  She has no rales. She exhibits no tenderness.  Abdominal: Soft. Bowel sounds are normal. She exhibits no distension and no mass. There is no tenderness. There is no guarding.  Musculoskeletal: Normal range of motion. She exhibits no edema.  Lymphadenopathy:    She has no cervical adenopathy.  Neurological: She is alert and oriented to person, place, and time. No cranial nerve deficit. She exhibits normal muscle tone. Coordination  normal. GCS score is 15.  Skin: Skin is warm and dry. She is not diaphoretic. No erythema.  Psychiatric: Memory, affect and judgment normal.     LABORATORY DATA:  I have reviewed the data as listed Lab Results  Component Value Date   WBC 8.0 07/12/2018   HGB 13.9 07/12/2018   HCT 42.2 07/12/2018   MCV 89.4 07/12/2018   PLT 341 07/12/2018   Recent Labs    10/20/17 0919 11/14/17 1352 05/10/18 1207  NA 146* 141 141  K 4.2 4.2 4.6  CL 107* 104 103  CO2 21 20 21   GLUCOSE 97 142* 88  BUN 10 9 11   CREATININE 0.86 0.82 0.79  CALCIUM 9.0 9.1 9.6  GFRNONAA 66 70 73  GFRAA 76 80 84  PROT 6.9 6.9 6.9  ALBUMIN 4.6 4.3 4.4  AST 14 17 25   ALT 12 14 22   ALKPHOS 78 75 68  BILITOT 0.8 0.6 1.0    Iron/TIBC/Ferritin/ %Sat    Component Value Date/Time   IRON 86 07/12/2018 1300   IRON 209 (H) 11/29/2017 1512   TIBC 328 07/12/2018 1300   TIBC 374 11/29/2017 1512   FERRITIN 34 07/12/2018 1300   FERRITIN 14 (L) 11/29/2017 1512   IRONPCTSAT 26 07/12/2018 1300   IRONPCTSAT 56 (H) 11/29/2017 1512     ASSESSMENT & PLAN:  Patient is a 77 year old female present for evaluation of iron deficiency anemia. 1. Iron deficiency anemia, unspecified iron deficiency anemia type   2. History of ductal carcinoma in situ (DCIS) of breast     #Labs reviewed and discussed with patient.  Her hemoglobin has been stable at 13.9.  MCV 89.4. Iron panel reviewed.  Advised patient to continue take oral ferrous sulfate 325 mg twice a day with meals.  She may use stool softener as  needed if she develops constipation.  # Family history of breast cancer, personal history of DCIS.  Reports that Dr. Tawni Pummel had done genetic testing the past and the patient reports not carrying breast cancer gene.  Discussed with patient that if she is interested in broader panel testing, we can refer her to genetic counselor.  All questions were answered. The patient knows to call the clinic with any problems questions or concerns.  Return of visit: 6 months Total face to face encounter time for this patient visit was 15  min. >50% of the time was  spent in counseling and coordination of care.  Earlie Server, MD, PhD Hematology Oncology [facility] at [facility] Pager- [phone] 07/13/2018

## 2018-07-24 ENCOUNTER — Ambulatory Visit: Payer: Self-pay | Admitting: Family Medicine

## 2018-08-02 ENCOUNTER — Encounter: Payer: Self-pay | Admitting: Obstetrics & Gynecology

## 2018-08-02 ENCOUNTER — Telehealth: Payer: Self-pay

## 2018-08-02 ENCOUNTER — Ambulatory Visit (INDEPENDENT_AMBULATORY_CARE_PROVIDER_SITE_OTHER): Payer: Medicare Other | Admitting: Obstetrics & Gynecology

## 2018-08-02 ENCOUNTER — Other Ambulatory Visit: Payer: Self-pay | Admitting: Obstetrics & Gynecology

## 2018-08-02 VITALS — BP 140/80 | Ht 64.0 in | Wt 182.0 lb

## 2018-08-02 DIAGNOSIS — R32 Unspecified urinary incontinence: Secondary | ICD-10-CM

## 2018-08-02 DIAGNOSIS — N993 Prolapse of vaginal vault after hysterectomy: Secondary | ICD-10-CM | POA: Diagnosis not present

## 2018-08-02 DIAGNOSIS — N8110 Cystocele, unspecified: Secondary | ICD-10-CM

## 2018-08-02 DIAGNOSIS — N8111 Cystocele, midline: Secondary | ICD-10-CM | POA: Diagnosis not present

## 2018-08-02 DIAGNOSIS — R3915 Urgency of urination: Secondary | ICD-10-CM

## 2018-08-02 MED ORDER — CLOTRIMAZOLE-BETAMETHASONE 1-0.05 % EX CREA: 1.0000 "application " | TOPICAL_CREAM | Freq: Two times a day (BID) | CUTANEOUS | 1 refills | Status: DC | PRN

## 2018-08-02 MED ORDER — MIRABEGRON ER 25 MG PO TB24: 25.0000 mg | ORAL_TABLET | Freq: Every day | ORAL | 6 refills | Status: DC

## 2018-08-02 MED ORDER — OXYBUTYNIN CHLORIDE ER 10 MG PO TB24: 10.0000 mg | ORAL_TABLET | Freq: Every day | ORAL | 11 refills | Status: DC

## 2018-08-02 NOTE — Progress Notes (Signed)
HPI:      [name] is a 77 y.o. [id number] who presents today for her pessary follow up and examination related to her pelvic floor weakening.  Pt reports tolerating the pessary well with  no vaginal bleeding and  no vaginal discharge but does have some odor and feels pessary looks and feels older and in need of replacement.  Symptoms of pelvic floor weakening have greatly improved. She is voiding and defecating without difficulty. She currently has a cup #4 pessary.  PMHx: She  has a past medical history of Anemia, Anxiety, Arthritis, BRCA negative (08/04/2015), Breast cancer ([place]) (2002), Cataract, GERD (gastroesophageal reflux disease), Heart murmur (2001), Hiatal hernia, High cholesterol, Hypertension, Personal history of radiation therapy (2002), Rosacea, and 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 complications. Also,  has a past surgical history that includes Stab pheblectomy  (2010); vein closure procedure (Bilateral, 2008); Abdominal hysterectomy; Cholecystectomy; Breast surgery (Right, 2002); Eye surgery (Right, 2014); Colonoscopy (2013); post radiation therapy (Right); Colonoscopy with propofol (N/A, 12/08/2015); Esophagogastroduodenoscopy (egd) with propofol (N/A, 12/08/2015); Breast excisional biopsy (Right, 2002); Breast cyst aspiration (Left, 2003); Colonoscopy with propofol (N/A, 12/06/2017); Esophagogastroduodenoscopy (egd) with propofol (N/A, 12/06/2017); Colonoscopy with propofol (N/A, 12/22/2017); Esophagogastroduodenoscopy (egd) with propofol (N/A, 12/22/2017); and [place] capsule study (N/A, 01/23/2018)., family history includes Breast cancer (age of onset: 77) in her other; Breast cancer (age of onset: 73) in her cousin; Breast cancer (age of onset: 26) in her maternal aunt; Cancer (age of onset: 71) in her cousin; Heart attack in her brother, father, mother, and sister; Other in her brother; Stroke in her sister.,  reports that she has never smoked. She has never used smokeless  tobacco. She reports that she does not drink alcohol or use drugs.  She has a current medication list which includes the following prescription(s): atorvastatin, calcium citrate, docusate sodium, ezetimibe, ferrous sulfate, glucosamine, metronidazole, multivitamin with minerals, omeprazole, oracea, psyllium, soolantra, spironolactone, tramadol, turmeric, and welchol. Also, has No Known Allergies.  Review of Systems  All other systems reviewed and are negative.  Objective: BP 140/80   Ht '5\' 4"'$  (1.626 m)   Wt 182 lb (82.6 kg)   BMI 31.24 kg/m  Physical Exam  Constitutional: She is oriented to person, place, and time. She appears well-developed and well-nourished. No distress.  Genitourinary: Vagina normal. Pelvic exam was performed with patient supine. There is no rash, tenderness or lesion on the right labia. There is no rash, tenderness or lesion on the left labia. No erythema or bleeding in the vagina.  Genitourinary Comments: Cuff intact/ no lesions Gr 1 cystocele, vaginal wall weakening Absent uterus and cervix  HENT:  Head: Normocephalic and atraumatic.  Nose: Nose normal.  Mouth/Throat: Oropharynx is clear and moist.  Abdominal: Soft. She exhibits no distension. There is no tenderness.  Musculoskeletal: Normal range of motion.  Neurological: She is alert and oriented to person, place, and time. No cranial nerve deficit.  Skin: Skin is warm and dry.  Psychiatric: She has a normal mood and affect.   Pessary Care Pessary removed and cleaned.  Vagina checked - without erosions - pessary replaced.  A/P: 1. Vaginal prolapse 2. 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 Urinary urgency 5. 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 Instructions given for care. Concerning symptoms to observe for are counseled to patient. Follow up scheduled for 3 months.  Urinary urgency and incontinence continues, will  start  Myrbetriq 25 mg daily, pros and cons dicussed.  Rx.  Renewal of Lotrisone.  A total of 15 minutes were spent face-to-face with the patient during this encounter and over half of that time dealt with counseling and coordination of care.  Barnett Applebaum, MD, [facility], [facility] 08/02/2018  10:30 AM

## 2018-08-02 NOTE — Telephone Encounter (Signed)
Let her know we will change Rx due to cost concerns, but they are not the same and will have to monitor for dry mouth or other side effects.

## 2018-08-02 NOTE — Patient Instructions (Signed)
Mirabegron extended-release tablets What is this medicine? MIRABEGRON (MIR a BEG ron) is used to treat overactive bladder. This medicine reduces the amount of bathroom visits. It may also help to control wetting accidents. This medicine may be used for other purposes; ask your health care provider or pharmacist if you have questions. COMMON BRAND NAME(S): Myrbetriq What should I tell my health care provider before I take this medicine? They need to know if you have any of these conditions: -difficulty passing urine -high blood pressure -kidney disease -liver disease -an unusual or allergic reaction to mirabegron, other medicines, foods, dyes, or preservatives -pregnant or trying to get pregnant -breast-feeding How should I use this medicine? Take this medicine by mouth with a glass of water. Follow the directions on the prescription label. Do not cut, crush or chew this medicine. You can take it with or without food. If it upsets your stomach, take it with food. Take your medicine at regular intervals. Do not take it more often than directed. Do not stop taking except on your doctor's advice. Talk to your pediatrician regarding the use of this medicine in children. Special care may be needed. Overdosage: If you think you have taken too much of this medicine contact a poison control center or emergency room at once. NOTE: This medicine is only for you. Do not share this medicine with others. What if I miss a dose? If you miss a dose, take it as soon as you can. If it is almost time for your next dose, take only that dose. Do not take double or extra doses. What may interact with this medicine? -certain medicines for bladder problems like fesoterodine, oxybutynin, solifenacin, tolterodine -desipramine -digoxin -flecainide -ketoconazole -MAOIs like Carbex, Eldepryl, Marplan, Nardil, and Parnate -metoprolol -propafenone -thioridazine -warfarin This list may not describe all possible  interactions. Give your health care provider a list of all the medicines, herbs, non-prescription drugs, or dietary supplements you use. Also tell them if you smoke, drink alcohol, or use illegal drugs. Some items may interact with your medicine. What should I watch for while using this medicine? It may take 8 weeks to notice the full benefit from this medicine. You may need to limit your intake tea, coffee, caffeinated sodas, and alcohol. These drinks may make your symptoms worse. Visit your doctor or health care professional for regular checks on your progress. Check your blood pressure as directed. Ask your doctor or health care professional what your blood pressure should be and when you should contact him or her. What side effects may I notice from receiving this medicine? Side effects that you should report to your doctor or health care professional as soon as possible: -allergic reactions like skin rash, itching or hives, swelling of the face, lips, or tongue -chest pain or palpitations -severe or sudden headache -high blood pressure -fast, irregular heartbeat -redness, blistering, peeling or loosening of the skin, including inside the mouth -signs of infection like fever or chills; cough; sore throat; pain or difficulty passing urine -trouble passing urine or change in the amount of urine Side effects that usually do not require medical attention (report to your doctor or health care professional if they continue or are bothersome): -constipation -diarrhea -dizziness -dry eyes -joint pain -mild headache -nausea -runny nose This list may not describe all possible side effects. Call your doctor for medical advice about side effects. You may report side effects to FDA at 1-800-FDA-1088. Where should I keep my medicine? Keep out of the reach   of children. Store at room temperature between 15 and 30 degrees C (59 and 86 degrees F). Throw away any unused medicine after the expiration  date. NOTE: This sheet is a summary. It may not cover all possible information. If you have questions about this medicine, talk to your doctor, pharmacist, or health care provider.  2018 Elsevier/Gold Standard (2015-10-08 12:14:30)  

## 2018-08-02 NOTE — Telephone Encounter (Signed)
Pt aware.

## 2018-08-02 NOTE — Telephone Encounter (Signed)
Pt was rx'd Myrbetriq this am and was told if too expensive to let [place] know.  Pharm suggested ditropan XL - it's similar but costs less.  [phone]

## 2018-08-06 ENCOUNTER — Ambulatory Visit
Admission: RE | Admit: 2018-08-06 | Discharge: 2018-08-06 | Disposition: A | Discharge status: Home / Self Care | Payer: Medicare Other | Source: Ambulatory Visit | Attending: General Surgery | Admitting: General Surgery

## 2018-08-06 DIAGNOSIS — Z1231 Encounter for screening mammogram for malignant neoplasm of breast: Secondary | ICD-10-CM

## 2018-08-09 ENCOUNTER — Encounter: Payer: Self-pay | Admitting: General Surgery

## 2018-08-09 ENCOUNTER — Ambulatory Visit: Payer: Medicare Other | Admitting: Obstetrics & Gynecology

## 2018-08-09 ENCOUNTER — Other Ambulatory Visit: Payer: Self-pay

## 2018-08-09 ENCOUNTER — Ambulatory Visit (INDEPENDENT_AMBULATORY_CARE_PROVIDER_SITE_OTHER): Payer: Medicare Other | Admitting: General Surgery

## 2018-08-09 VITALS — BP 142/78 | HR 78 | Resp 14 | Ht 64.0 in

## 2018-08-09 DIAGNOSIS — Z1231 Encounter for screening mammogram for malignant neoplasm of breast: Secondary | ICD-10-CM

## 2018-08-09 NOTE — Progress Notes (Signed)
Patient ID: Claire Kane, female   DOB: 1940/11/07, 77 y.o.   MRN: [id number]  Chief Complaint  Patient presents with  . Follow-up    HPI Claire Kane is a 77 y.o. female who presents for a breast evaluation. The most recent mammogram was done on 08/06/2018.  Patient does perform regular self breast checks and gets regular mammograms done.     Past Medical History:  Diagnosis Date  . Anemia   . Anxiety   . Arthritis   . 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 Cataract   . GERD (gastroesophageal reflux disease)   . Heart murmur 2001  . Hiatal hernia   . High cholesterol   . Hypertension   . Personal history of radiation therapy 2002   BREAST CA  . Rosacea   . 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 complications     Past Surgical History:  Procedure Laterality Date  . ABDOMINAL HYSTERECTOMY    . BREAST CYST ASPIRATION Left 2003  . BREAST EXCISIONAL BIOPSY Right 2002   POS  . BREAST LUMPECTOMY Right 2002   w/ radiation  . BREAST SURGERY Right 2002   lumpectomy  . CHOLECYSTECTOMY    . COLONOSCOPY  2013  . COLONOSCOPY WITH PROPOFOL N/A 12/08/2015   Procedure: COLONOSCOPY WITH PROPOFOL;  Surgeon: Christene Lye, MD;  Location: [facility] ENDOSCOPY;  Service: Endoscopy;  Laterality: N/A;  . COLONOSCOPY WITH PROPOFOL N/A 12/06/2017   Procedure: COLONOSCOPY WITH PROPOFOL;  Surgeon: Jonathon Bellows, MD;  Location: [facility] ENDOSCOPY;  Service: Gastroenterology;  Laterality: N/A;  . COLONOSCOPY WITH PROPOFOL N/A 12/22/2017   Procedure: COLONOSCOPY WITH PROPOFOL;  Surgeon: Jonathon Bellows, MD;  Location: [facility] ENDOSCOPY;  Service: Gastroenterology;  Laterality: N/A;  . ESOPHAGOGASTRODUODENOSCOPY (EGD) WITH PROPOFOL N/A 12/08/2015   Procedure: ESOPHAGOGASTRODUODENOSCOPY (EGD) WITH PROPOFOL;  Surgeon: Christene Lye, MD;  Location: [facility] ENDOSCOPY;  Service: Endoscopy;  Laterality: N/A;  . ESOPHAGOGASTRODUODENOSCOPY (EGD) WITH  PROPOFOL N/A 12/06/2017   Procedure: ESOPHAGOGASTRODUODENOSCOPY (EGD) WITH PROPOFOL;  Surgeon: Jonathon Bellows, MD;  Location: [facility] ENDOSCOPY;  Service: Gastroenterology;  Laterality: N/A;  . ESOPHAGOGASTRODUODENOSCOPY (EGD) WITH PROPOFOL N/A 12/22/2017   Procedure: ESOPHAGOGASTRODUODENOSCOPY (EGD) WITH PROPOFOL;  Surgeon: Jonathon Bellows, MD;  Location: [facility] ENDOSCOPY;  Service: Gastroenterology;  Laterality: N/A;  . EYE SURGERY Right 2014  . GIVENS CAPSULE STUDY N/A 01/23/2018   Procedure: GIVENS CAPSULE STUDY;  Surgeon: Jonathon Bellows, MD;  Location: [facility] ENDOSCOPY;  Service: Gastroenterology;  Laterality: N/A;  . post radiation therapy Right    right breast cancer 2002  . Stab pheblectomy   2010  . vein closure procedure Bilateral 2008    Family History  Problem Relation Age of Onset  . Heart attack Mother   . Heart attack Father   . Heart attack Sister   . Stroke Sister   . Heart attack Brother   . Other Brother        cerebral hemorrhage, sepsis  . Breast cancer Cousin 38       breast/maternal  . Cancer Cousin 36       breast/maternal  . Breast cancer Maternal Aunt 70  . Breast cancer Other 48       maternal neice    Social History Social History   Tobacco Use  . Smoking status: Never Smoker  . Smokeless tobacco: Never Used  Substance Use Topics  . Alcohol use: No  .  Drug use: No    No Known Allergies  Current Outpatient Medications  Medication Sig Dispense Refill  . atorvastatin (LIPITOR) 10 MG tablet Take 10 mg by mouth daily.    . calcium citrate (CALCITRATE - DOSED IN MG ELEMENTAL CALCIUM) 950 MG tablet Take 200 mg of elemental calcium by mouth 2 (two) times daily.     . clotrimazole-betamethasone (LOTRISONE) cream Apply 1 application topically 2 (two) times daily as needed. 45 g 1  . docusate sodium (STOOL SOFTENER) 100 MG capsule Take 100 mg by mouth daily as needed for mild constipation.    Marland Kitchen ezetimibe (ZETIA) 10 MG tablet Take 10 mg by mouth daily.    . ferrous  sulfate 325 (65 FE) MG EC tablet Take 1 tablet (325 mg total) by mouth 2 (two) times daily with a meal. 60 tablet 2  . Glucosamine 750 MG TABS Take by mouth daily.    . metroNIDAZOLE (METROGEL) 0.75 % gel APPLY TO AFFECTED AREA ON FACE BID  3  . Multiple Vitamins-Minerals (MULTIVITAMIN WITH MINERALS) tablet Take 1 tablet by mouth daily.    Marland Kitchen omeprazole (PRILOSEC) 20 MG capsule TAKE 1 CAPSULE BY MOUTH  DAILY 90 capsule 3  . ORACEA 40 MG capsule PRN    . oxybutynin (DITROPAN-XL) 10 MG 24 hr tablet Take 1 tablet (10 mg total) by mouth at bedtime. 30 tablet 11  . psyllium (REGULOID) 0.52 G capsule Take 0.52 g by mouth daily.    . SOOLANTRA 1 % CREA     . spironolactone (ALDACTONE) 50 MG tablet 50 mg daily.     . traMADol (ULTRAM) 50 MG tablet TAKE 1 TABLET BY MOUTH  EVERY 6 HOURS AS NEEDED 100 tablet 3  . TURMERIC PO Take by mouth daily.    Claire Kane 625 MG tablet Take 1 tablet by mouth 3 (three) times daily.      No current facility-administered medications for this visit.     Review of Systems Review of Systems  Constitutional: Negative.   Respiratory: Negative.   Cardiovascular: Negative.     Blood pressure (!) 142/78, pulse 78, resp. rate 14, height 5' 4"  (1.626 m), SpO2 98 %.  Physical Exam Physical Exam  Constitutional: She is oriented to person, place, and time. She appears well-developed and well-nourished.  HENT:  Mouth/Throat: Oropharynx is clear and moist.  Eyes: Conjunctivae are normal. No scleral icterus.  Neck: Neck supple.  Cardiovascular: Normal rate and regular rhythm.  Murmur heard.  Systolic murmur is present with a grade of 2/6. Pulmonary/Chest: Effort normal and breath sounds normal. Right breast exhibits no inverted nipple, no mass, no nipple discharge, no skin change and no tenderness. Left breast exhibits no inverted nipple, no mass, no nipple discharge, no skin change and no tenderness.    Lymphadenopathy:    She has no cervical adenopathy.    She has no  axillary adenopathy.       Right: No supraclavicular adenopathy present.       Left: No supraclavicular adenopathy present.  Neurological: She is alert and oriented to person, place, and time.  Skin: Skin is warm and dry.  Psychiatric: Her behavior is normal.    Data Reviewed Bilateral screening mammograms completed on August 06, 2018 reviewed. No interval change. BIRAD 2.   Assessment    No evidence of recurrent breast cancer.    Plan  Patient desires to have Barnett Applebaum, MD, her OBGYN for yearly mammogram and breast checks. She sees him three  times a year for management of a pessary.    The patient is aware to call back for any questions or concerns.   HPI, Physical Exam, Assessment and Plan have been scribed under the direction and in the presence of Hervey Ard, MD.  Gaspar Cola, CMA  HPI, Physical Exam, Assessment and Plan have been scribed under the direction and in the presence of Robert Bellow, MD. Karie Fetch, RN  [facility] Gleason Kanan Sobek 08/09/2018, 9:38 AM

## 2018-08-09 NOTE — Patient Instructions (Signed)
Patient to return to her OBGYN for yearly mammogram and breast checks. The patient is aware to call back for any questions or concerns.

## 2018-08-13 ENCOUNTER — Ambulatory Visit: Payer: Medicare Other | Admitting: Obstetrics & Gynecology

## 2018-08-23 ENCOUNTER — Other Ambulatory Visit: Payer: Self-pay | Admitting: *Deleted

## 2018-08-27 ENCOUNTER — Telehealth: Payer: Self-pay

## 2018-08-27 DIAGNOSIS — G8929 Other chronic pain: Secondary | ICD-10-CM

## 2018-08-27 DIAGNOSIS — M25569 Pain in unspecified knee: Principal | ICD-10-CM

## 2018-08-27 MED ORDER — FERROUS SULFATE 325 (65 FE) MG PO TBEC: 325.0000 mg | DELAYED_RELEASE_TABLET | Freq: Two times a day (BID) | ORAL | 1 refills | Status: DC

## 2018-08-27 NOTE — Telephone Encounter (Signed)
Please advise 

## 2018-08-27 NOTE — Telephone Encounter (Signed)
ok 

## 2018-08-27 NOTE — Telephone Encounter (Signed)
Order for referral placed.

## 2018-08-27 NOTE — Telephone Encounter (Signed)
Patient called office requesting referral to Ortho for chronic knee pain. KW

## 2018-09-04 ENCOUNTER — Other Ambulatory Visit: Payer: Self-pay | Admitting: Family Medicine

## 2018-09-04 MED ORDER — TRAMADOL HCL 50 MG PO TABS: 50.0000 mg | ORAL_TABLET | Freq: Four times a day (QID) | ORAL | 3 refills | Status: DC | PRN

## 2018-09-04 NOTE — Telephone Encounter (Signed)
Pt needing a refill on: traMADol (ULTRAM) 50 MG tablet  Please fill at:  [place], [place] [phone] (Phone) [phone] (Fax)   Thanks, [facility]

## 2018-09-04 NOTE — Telephone Encounter (Signed)
Please review. Thanks!  

## 2018-09-05 ENCOUNTER — Other Ambulatory Visit: Payer: Self-pay

## 2018-09-05 NOTE — Telephone Encounter (Signed)
Patient has called the office requesting for CMA to give her a call back. Patient states that her Tramadol prescription was sent to [facility] and she states that this was suppose to be sent to [facility]. Patient is requesting that we resend in prescription today.KW

## 2018-09-06 MED ORDER — TRAMADOL HCL 50 MG PO TABS: 50.0000 mg | ORAL_TABLET | Freq: Four times a day (QID) | ORAL | 3 refills | Status: DC | PRN

## 2018-10-09 ENCOUNTER — Ambulatory Visit: Payer: Self-pay | Admitting: Family Medicine

## 2018-12-04 ENCOUNTER — Ambulatory Visit: Payer: Self-pay | Admitting: Obstetrics & Gynecology

## 2019-01-03 ENCOUNTER — Telehealth: Payer: Self-pay | Admitting: *Deleted

## 2019-01-03 NOTE — Telephone Encounter (Signed)
Patient called to CANCEL her 01/11/19 lab/MD appts.Due to [place]. She stated that she would call back at a later date to R/S.

## 2019-01-10 ENCOUNTER — Ambulatory Visit: Payer: Medicare Other | Admitting: Oncology

## 2019-01-10 ENCOUNTER — Other Ambulatory Visit: Payer: Medicare Other

## 2019-01-11 ENCOUNTER — Other Ambulatory Visit: Payer: Medicare Other

## 2019-01-11 ENCOUNTER — Ambulatory Visit: Payer: Medicare Other | Admitting: Oncology

## 2019-01-27 ENCOUNTER — Other Ambulatory Visit: Payer: Self-pay | Admitting: Family Medicine

## 2019-01-27 DIAGNOSIS — K30 Functional dyspepsia: Secondary | ICD-10-CM

## 2019-02-28 ENCOUNTER — Encounter: Payer: Medicare Other | Admitting: Family Medicine

## 2019-02-28 ENCOUNTER — Ambulatory Visit: Payer: Medicare Other

## 2019-04-01 ENCOUNTER — Encounter: Payer: Self-pay | Admitting: Obstetrics & Gynecology

## 2019-04-01 ENCOUNTER — Other Ambulatory Visit: Payer: Self-pay

## 2019-04-01 ENCOUNTER — Ambulatory Visit (INDEPENDENT_AMBULATORY_CARE_PROVIDER_SITE_OTHER): Payer: Medicare Other | Admitting: Obstetrics & Gynecology

## 2019-04-01 VITALS — BP 142/80 | Ht 64.0 in | Wt 191.0 lb

## 2019-04-01 DIAGNOSIS — Z1239 Encounter for other screening for malignant neoplasm of breast: Secondary | ICD-10-CM | POA: Diagnosis not present

## 2019-04-01 DIAGNOSIS — N993 Prolapse of vaginal vault after hysterectomy: Secondary | ICD-10-CM

## 2019-04-01 DIAGNOSIS — N8111 Cystocele, midline: Secondary | ICD-10-CM

## 2019-04-01 DIAGNOSIS — R3915 Urgency of urination: Secondary | ICD-10-CM | POA: Diagnosis not present

## 2019-04-01 MED ORDER — VANIQA 13.9 % EX CREA: 1.0000 | TOPICAL_CREAM | Freq: Every day | CUTANEOUS | 3 refills | Status: DC | PRN

## 2019-04-01 NOTE — Progress Notes (Signed)
HPI:      [name] is a 78 y.o. [id number] who presents today for her pessary follow up and examination related to her pelvic floor weakening.  Pt reports tolerating the pessary well with  no vaginal bleeding and  no vaginal discharge.  Symptoms of pelvic floor weakening have greatly improved. She is voiding and defecating without difficulty. She currently has a CUP TYPE #4 pessary.  PMHx: She  has a past medical history of Anemia, Anxiety, Arthritis, BRCA negative (08/04/2015), Breast cancer ([place]) (2002), Cataract, GERD (gastroesophageal reflux disease), Heart murmur (2001), Hiatal hernia, High cholesterol, Hypertension, Personal history of radiation therapy (2002), Rosacea, and 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 complications. Also,  has a past surgical history that includes Stab pheblectomy  (2010); vein closure procedure (Bilateral, 2008); Abdominal hysterectomy; Cholecystectomy; Breast surgery (Right, 2002); Eye surgery (Right, 2014); Colonoscopy (2013); post radiation therapy (Right); Colonoscopy with propofol (N/A, 12/08/2015); Esophagogastroduodenoscopy (egd) with propofol (N/A, 12/08/2015); Breast cyst aspiration (Left, 2003); Colonoscopy with propofol (N/A, 12/06/2017); Esophagogastroduodenoscopy (egd) with propofol (N/A, 12/06/2017); Colonoscopy with propofol (N/A, 12/22/2017); Esophagogastroduodenoscopy (egd) with propofol (N/A, 12/22/2017); Givens capsule study (N/A, 01/23/2018); Breast excisional biopsy (Right, 2002); and Breast lumpectomy (Right, 2002)., family history includes Breast cancer (age of onset: 38) in an other family member; Breast cancer (age of onset: 96) in her cousin; Breast cancer (age of onset: 69) in her maternal aunt; Cancer (age of onset: 12) in her cousin; Heart attack in her brother, father, mother, and sister; Other in her brother; Stroke in her sister.,  reports that she has never smoked. She has never used smokeless tobacco. She reports that she does not drink  alcohol or use drugs.  She has a current medication list which includes the following prescription(s): atorvastatin, calcium citrate, clotrimazole-betamethasone, docusate sodium, vaniqa, ezetimibe, ferrous sulfate, glucosamine, metronidazole, multivitamin with minerals, omeprazole, oracea, psyllium, soolantra, spironolactone, tramadol, turmeric, and welchol. Also, has No Known Allergies.  Review of Systems  All other systems reviewed and are negative.   Objective: BP (!) 142/80   Ht _0  (1.626 m)   Wt 191 lb (86.6 kg)   BMI 32.79 kg/m  Physical Exam Constitutional:      General: She is not in acute distress.    Appearance: She is well-developed.  Genitourinary:     Pelvic exam was performed with patient supine.     Vagina normal.     No vaginal erythema or bleeding.     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     Nose: Nose normal.  Abdominal:     General: There is no distension.     Palpations: Abdomen is soft.     Tenderness: There is no abdominal tenderness.  Musculoskeletal: Normal range of motion.  Neurological:     Mental Status: She is alert and oriented to person, place, and time.     Cranial Nerves: No cranial nerve deficit.  Skin:    General: Skin is warm and dry.     Pessary Care Pessary removed and cleaned.  Vagina checked - without erosions - pessary replaced.  A/P:1. Cystocele, midline Cont pessary  2. Prolapse of vaginal vault after hysterectomy  3. Urinary urgency No meds as has had SE or cost issues w meds Copnsider appeal for alternative to Oxbutynin if needed in future  4. Screening for breast cancer - [facility] in  Nov, exam nv - MM 3D SCREEN BREAST BILATERAL; Future  Pessary was cleaned and replaced today. Instructions given for care. Concerning symptoms to observe for are counseled to patient. Follow up scheduled for 6  months.  A total of 15 minutes were spent face-to-face with the patient during this encounter and over half of that time dealt with counseling and coordination of care.  Barnett Applebaum, MD, [facility], [facility] 04/01/2019  11:28 AM

## 2019-04-23 ENCOUNTER — Other Ambulatory Visit: Payer: Self-pay | Admitting: *Deleted

## 2019-04-23 MED ORDER — FERROUS SULFATE 325 (65 FE) MG PO TBEC: 325.0000 mg | DELAYED_RELEASE_TABLET | Freq: Two times a day (BID) | ORAL | 1 refills | Status: DC

## 2019-06-10 ENCOUNTER — Ambulatory Visit (INDEPENDENT_AMBULATORY_CARE_PROVIDER_SITE_OTHER): Payer: Medicare Other | Admitting: Family Medicine

## 2019-06-10 ENCOUNTER — Encounter: Payer: Self-pay | Admitting: Family Medicine

## 2019-06-10 ENCOUNTER — Other Ambulatory Visit: Payer: Self-pay

## 2019-06-10 VITALS — BP 118/70 | HR 78 | Temp 97.9°F | Resp 18 | Wt 188.2 lb

## 2019-06-10 DIAGNOSIS — G8929 Other chronic pain: Secondary | ICD-10-CM

## 2019-06-10 DIAGNOSIS — M25569 Pain in unspecified knee: Secondary | ICD-10-CM | POA: Diagnosis not present

## 2019-06-10 DIAGNOSIS — Z0000 Encounter for general adult medical examination without abnormal findings: Secondary | ICD-10-CM

## 2019-06-10 DIAGNOSIS — Z23 Encounter for immunization: Secondary | ICD-10-CM

## 2019-06-10 DIAGNOSIS — D509 Iron deficiency anemia, unspecified: Secondary | ICD-10-CM

## 2019-06-10 DIAGNOSIS — E7849 Other hyperlipidemia: Secondary | ICD-10-CM | POA: Diagnosis not present

## 2019-06-10 DIAGNOSIS — R7303 Prediabetes: Secondary | ICD-10-CM

## 2019-06-10 NOTE — Progress Notes (Signed)
Patient: Claire Kane, Female    DOB: [birth year], 78 y.o.   MRN: [id number] Visit Date: 06/10/2019  Today's Provider: Wilhemena Durie, MD   Chief Complaint  Patient presents with  . Annual Exam   Subjective:     Annual physical exam Claire Kane is a 78 y.o. female who presents today for health maintenance and complete physical. She feels well. She reports exercising by walking. She reports she is sleeping well. 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 She will need a total knee replacement and is on meloxicam from [facility]. Recently she continues to take an iron daily for iron deficiency anemia.  We need to follow-up on this. He has not seen Dr. Arther Dames anymore for her hyperlipidemia.  We are managing this.  -----------------------------------------------------------------   Review of Systems  Constitutional: Negative.   HENT: Negative.   Eyes: Positive for discharge and itching.  Respiratory: Negative.   Cardiovascular: Negative.   Gastrointestinal: Positive for constipation.  Endocrine: Negative.   Genitourinary: Negative.   Musculoskeletal: Positive for back pain and joint swelling.  Allergic/Immunologic: Negative.   Neurological: Negative.   Hematological: Negative.   Psychiatric/Behavioral: Negative.     Social History      She  reports that she has never smoked. She has never used smokeless tobacco. She reports that she does not drink alcohol or use drugs.       Social History   Socioeconomic History  . Marital status: Married    Spouse name: Not on file  . Number of children: 2  . Years of education: Not on file  . Highest education level: Bachelor's degree (e.g., BA, AB, BS)  Occupational History  . Occupation: retired  Scientific laboratory technician  . Financial resource strain: Not hard at all  . Food insecurity    Worry: Never true    Inability: Never true  . Transportation needs    Medical: No    Non-medical: No  Tobacco Use  .  Smoking status: Never Smoker  . Smokeless tobacco: Never Used  Substance and Sexual Activity  . Alcohol use: No  . Drug use: No  . Sexual activity: Never  Lifestyle  . Physical activity    Days per week: Not on file    Minutes per session: Not on file  . Stress: Only a little  Relationships  . Social Herbalist on phone: Not on file    Gets together: Not on file    Attends religious service: Not on file    Active member of club or organization: Not on file    Attends meetings of clubs or organizations: Not on file    Relationship status: Not on file  Other Topics Concern  . Not on file  Social History Narrative  . Not on file    Past Medical History:  Diagnosis Date  . Anemia   . Anxiety   . Arthritis   . 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 Cataract   . GERD (gastroesophageal reflux disease)   . Heart murmur 2001  . Hiatal hernia   . High cholesterol   . Hypertension   . Personal history of radiation therapy 2002   BREAST CA  . Rosacea   . 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 complications      Patient Active Problem List  Diagnosis Date Noted  . Prolapse of vaginal vault after hysterectomy 04/01/2019  . Acute cystitis without hematuria 12/12/2017  . Cystocele, midline 04/05/2017  . Vaginal prolapse 04/05/2017  . Iron deficiency anemia 04/28/2016  . Shortness of breath 11/13/2015  . Carotid artery narrowing 11/13/2015  . Cataract 11/04/2015  . Clinical depression 11/04/2015  . Dyslipidemia 11/04/2015  . 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 Acid reflux 11/04/2015  . Adiposity 11/04/2015  . 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 Awareness of heartbeats 11/04/2015  . Fibrocystic breast disease 08/19/2015  . Carotid stenosis 08/19/2015  . Osteoarthritis 08/19/2015  . Obesity 08/19/2015   . Hyperlipidemia 08/19/2015  . 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 Heart murmur, systolic 16/09/930  . 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 Other specified symptoms and signs involving the circulatory and respiratory systems 06/17/2011  . Acid indigestion 06/17/2011  . Gout 06/17/2011    Past Surgical History:  Procedure Laterality Date  . ABDOMINAL HYSTERECTOMY    . BREAST CYST ASPIRATION Left 2003  . BREAST EXCISIONAL BIOPSY Right 2002   POS  . BREAST LUMPECTOMY Right 2002   w/ radiation  . BREAST SURGERY Right 2002   lumpectomy  . CHOLECYSTECTOMY    . COLONOSCOPY  2013  . COLONOSCOPY WITH PROPOFOL N/A 12/08/2015   Procedure: COLONOSCOPY WITH PROPOFOL;  Surgeon: Christene Lye, MD;  Location: [facility] ENDOSCOPY;  Service: Endoscopy;  Laterality: N/A;  . COLONOSCOPY WITH PROPOFOL N/A 12/06/2017   Procedure: COLONOSCOPY WITH PROPOFOL;  Surgeon: Jonathon Bellows, MD;  Location: [facility] ENDOSCOPY;  Service: Gastroenterology;  Laterality: N/A;  . COLONOSCOPY WITH PROPOFOL N/A 12/22/2017   Procedure: COLONOSCOPY WITH PROPOFOL;  Surgeon: Jonathon Bellows, MD;  Location: [facility] ENDOSCOPY;  Service: Gastroenterology;  Laterality: N/A;  . ESOPHAGOGASTRODUODENOSCOPY (EGD) WITH PROPOFOL N/A 12/08/2015   Procedure: ESOPHAGOGASTRODUODENOSCOPY (EGD) WITH PROPOFOL;  Surgeon: Christene Lye, MD;  Location: [facility] ENDOSCOPY;  Service: Endoscopy;  Laterality: N/A;  . ESOPHAGOGASTRODUODENOSCOPY (EGD) WITH PROPOFOL N/A 12/06/2017   Procedure: ESOPHAGOGASTRODUODENOSCOPY (EGD) WITH PROPOFOL;  Surgeon: Jonathon Bellows, MD;  Location: [facility] ENDOSCOPY;  Service: Gastroenterology;  Laterality: N/A;  . ESOPHAGOGASTRODUODENOSCOPY (EGD) WITH PROPOFOL N/A 12/22/2017   Procedure: ESOPHAGOGASTRODUODENOSCOPY (EGD) WITH PROPOFOL;  Surgeon: Jonathon Bellows, MD;  Location: [facility] ENDOSCOPY;  Service: Gastroenterology;  Laterality: N/A;  . EYE SURGERY Right 2014   . GIVENS CAPSULE STUDY N/A 01/23/2018   Procedure: GIVENS CAPSULE STUDY;  Surgeon: Jonathon Bellows, MD;  Location: [facility] ENDOSCOPY;  Service: Gastroenterology;  Laterality: N/A;  . post radiation therapy Right    right breast cancer 2002  . Stab pheblectomy   2010  . vein closure procedure Bilateral 2008    Family History        Family Status  Relation Name Status  . Mother  Deceased at age 108  . Father  Deceased at age 60  . Sister  Deceased at age 32  . Brother  Deceased at age 77  . Brother  Deceased at age 26  . Brother  Alive  . Cousin  (Not Specified)  . Cousin  (Not Specified)  . Mat Aunt  (Not Specified)  . Other  (Not Specified)        Her family history includes Breast cancer (age of onset: 53) in an other family member; Breast cancer (age of onset: 57) in her cousin; Breast cancer (age of onset: 28) in  her maternal aunt; Cancer (age of onset: 13) in her cousin; Heart attack in her brother, father, mother, and sister; Other in her brother; Stroke in her sister.      No Known Allergies   Current Outpatient Medications:  .  atorvastatin (LIPITOR) 10 MG tablet, Take 10 mg by mouth daily., Disp: , Rfl:  .  calcium citrate (CALCITRATE - DOSED IN MG ELEMENTAL CALCIUM) 950 MG tablet, Take 200 mg of elemental calcium by mouth 2 (two) times daily. , Disp: , Rfl:  .  clotrimazole-betamethasone (LOTRISONE) cream, Apply 1 application topically 2 (two) times daily as needed., Disp: 45 g, Rfl: 1 .  doxycycline (DORYX) 100 MG EC tablet, Take 100 mg by mouth 2 (two) times daily., Disp: , Rfl:  .  Eflornithine HCl (VANIQA) 13.9 % cream, Apply 1 Dose topically daily as needed., Disp: 45 g, Rfl: 3 .  ezetimibe (ZETIA) 10 MG tablet, Take 10 mg by mouth daily., Disp: , Rfl:  .  ferrous sulfate 325 (65 FE) MG EC tablet, Take 1 tablet (325 mg total) by mouth 2 (two) times daily with a meal. (Patient taking differently: Take 325 mg by mouth daily with breakfast. ), Disp: 180 tablet, Rfl: 1 .   Glucosamine 750 MG TABS, Take by mouth daily., Disp: , Rfl:  .  meloxicam (MOBIC) 15 MG tablet, Take 15 mg by mouth daily., Disp: , Rfl:  .  metroNIDAZOLE (METROGEL) 0.75 % gel, APPLY TO AFFECTED AREA ON FACE BID, Disp: , Rfl: 3 .  Multiple Vitamins-Minerals (MULTIVITAMIN WITH MINERALS) tablet, Take 1 tablet by mouth daily., Disp: , Rfl:  .  omeprazole (PRILOSEC) 20 MG capsule, TAKE 1 CAPSULE BY MOUTH  DAILY, Disp: 90 capsule, Rfl: 3 .  ORACEA 40 MG capsule, PRN, Disp: , Rfl:  .  psyllium (REGULOID) 0.52 G capsule, Take 0.52 g by mouth daily., Disp: , Rfl:  .  spironolactone (ALDACTONE) 50 MG tablet, 50 mg daily. , Disp: , Rfl:  .  traMADol (ULTRAM) 50 MG tablet, Take 1 tablet (50 mg total) by mouth every 6 (six) hours as needed., Disp: 100 tablet, Rfl: 3 .  TURMERIC PO, Take by mouth daily., Disp: , Rfl:  .  docusate sodium (STOOL SOFTENER) 100 MG capsule, Take 100 mg by mouth daily as needed for mild constipation., Disp: , Rfl:  .  SOOLANTRA 1 % CREA, , Disp: , Rfl:  .  WELCHOL 625 MG tablet, Take 1 tablet by mouth 3 (three) times daily. , Disp: , Rfl:    Patient Care Team: Jerrol Banana., MD as PCP - General (Family Medicine) Dingeldein, Remo Lipps, MD as Consulting Physician (Ophthalmology) Gae Dry, MD as Referring Physician (Obstetrics and Gynecology) Jonathon Bellows, MD as Consulting Physician (Gastroenterology) Earlie Server, MD as Consulting Physician (Oncology) Thomes Dinning, MD as Referring Physician (Internal Medicine)    Objective:    Vitals: BP 118/70 (BP Location: Left Arm, Patient Position: Sitting, Cuff Size: Normal)   Pulse 78   Temp 97.9 F (36.6 C) (Temporal)   Resp 18   Wt 188 lb 3.2 oz (85.4 kg)   SpO2 99%   BMI 32.30 kg/m    Vitals:   06/10/19 0932  BP: 118/70  Pulse: 78  Resp: 18  Temp: 97.9 F (36.6 C)  TempSrc: Temporal  SpO2: 99%  Weight: 188 lb 3.2 oz (85.4 kg)     Physical Exam Vitals signs reviewed.  Constitutional:      Appearance:  She is  well-developed.  HENT:     Head: Normocephalic and atraumatic.     Right Ear: External ear normal.     Left Ear: External ear normal.     Nose: Nose normal.  Eyes:     General: No scleral icterus.    Conjunctiva/sclera: Conjunctivae normal.  Neck:     Thyroid: No thyromegaly.  Cardiovascular:     Rate and Rhythm: Normal rate and regular rhythm.     Heart sounds: Normal heart sounds.  Pulmonary:     Effort: Pulmonary effort is normal.     Breath sounds: Normal breath sounds.  Abdominal:     Palpations: Abdomen is soft.  Lymphadenopathy:     Cervical: No cervical adenopathy.  Skin:    General: Skin is warm and dry.  Neurological:     General: No focal deficit present.     Mental Status: She is alert and oriented to person, place, and time.  Psychiatric:        Mood and Affect: Mood normal.        Behavior: Behavior normal.        Thought Content: Thought content normal.        Judgment: Judgment normal.      Depression Screen PHQ 2/9 Scores 06/10/2019 02/20/2018 12/13/2016 12/13/2016  PHQ - 2 Score 0 0 0 0  PHQ- 9 Score 1 3 0 -       Assessment & Plan:     Routine Health Maintenance and Physical Exam  Exercise Activities and Dietary recommendations Goals    . DIET - INCREASE WATER INTAKE     Recommend increasing water intake to 4-6 glasses a day and decreasing soda and tea intake.     Marland Kitchen Exercise      Recommend increasing exercise by walking 4 days a week for 25 minutes.        Immunization History  Administered Date(s) Administered  . Influenza, High Dose Seasonal PF 05/28/2015, 07/05/2017  . Influenza,inj,Quad PF,6+ Mos 06/27/2018  . Influenza-Unspecified 05/09/2016  . Pneumococcal Conjugate-13 04/22/2014  . Pneumococcal Polysaccharide-23 03/15/2011  . Td 09/06/2007, 12/30/2012  . Tdap 04/22/2014  . Zoster 06/27/2011    Health Maintenance  Topic Date Due  . INFLUENZA VACCINE  04/20/2019  . TETANUS/TDAP  04/22/2024  . DEXA SCAN  Completed  .  PNA vac Low Risk Adult  Completed     Discussed health benefits of physical activity, and encouraged her to engage in regular exercise appropriate for her age and condition.   1. Encounter for annual physical exam  - TSH - Comp. Metabolic Panel (12) - CBC w/Diff/Platelet  2. Need for immunization against influenza  - Flu vaccine HIGH DOSE PF (Fluzone High dose)  3. Other hyperlipidemia Continue atorvastatin and lifestyle changes. - Lipid Profile - Comp. Metabolic Panel (12)  4. Iron deficiency anemia, unspecified iron deficiency anemia type Continue daily iron for the time being.  Recheck labs.  May discontinue iron when it is back to a normal level. - Iron, TIBC and Ferritin Panel - Comp. Metabolic Panel (12) - CBC w/Diff/Platelet  5. Borderline diabetes mellitus  - TSH - Comp. Metabolic Panel (12)  6. Chronic knee pain, unspecified laterality In need of TKR.  --------------------------------------------------------------------    Wilhemena Durie, MD  [facility]

## 2019-06-10 NOTE — Progress Notes (Signed)
Subjective:   Claire Kane is a 78 y.o. female who presents for Medicare Annual (Subsequent) preventive examination.    This visit is being conducted through telemedicine due to the COVID-19 pandemic. This patient has given me verbal consent via doximity to conduct this visit, patient states they are participating from their home address. Some vital signs may be absent or patient reported.    Patient identification: identified by name, DOB, and current address  Review of Systems:  N/A  Cardiac Risk Factors include: advanced age (>59mn, >>66women);dyslipidemia     Objective:     Vitals: There were no vitals taken for this visit.  There is no height or weight on file to calculate BMI. Unable to obtain vitals due to visit being conducted via telephonically.   Advanced Directives 06/11/2019 07/12/2018 04/11/2018 02/20/2018 01/09/2018 12/07/2017 12/06/2017  Does Patient Have a Medical Advance Directive? Yes Yes Yes Yes Yes Yes No;Yes  Type of [place]amed[place]of [place] - - [place] will [place] will Out of facility DNR (pink MOST or yellow form)  Does patient want to make changes to medical advance directive? - - - - - - -  Copy of [place] Chart? No - copy requested - - No - copy requested - - -  Would patient like information on creating a medical advance directive? - - - - - - -    Tobacco Social History   Tobacco Use  Smoking Status Never Smoker  Smokeless Tobacco Never Used     Counseling given: Not Answered   Clinical Intake:  Pre-visit preparation completed: Yes  Pain : No/denies pain Pain Score: 0-No pain     Nutritional Risks: None Diabetes: No  How often do you need to have someone help you when you read instructions, pamphlets, or other written materials from your doctor or pharmacy?: 1 - Never  Interpreter  Needed?: No  Information [facility] :: [facility]  Past Medical History:  Diagnosis Date  . Anemia   . Anxiety   . Arthritis   . 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 Cataract   . GERD (gastroesophageal reflux disease)   . Heart murmur 2001  . Hiatal hernia   . High cholesterol   . Hypertension   . Personal history of radiation therapy 2002   BREAST CA  . Rosacea   . 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 complications    Past Surgical History:  Procedure Laterality Date  . ABDOMINAL HYSTERECTOMY    . BREAST CYST ASPIRATION Left 2003  . BREAST EXCISIONAL BIOPSY Right 2002   POS  . BREAST LUMPECTOMY Right 2002   w/ radiation  . BREAST SURGERY Right 2002   lumpectomy  . CHOLECYSTECTOMY    . COLONOSCOPY  2013  . COLONOSCOPY WITH PROPOFOL N/A 12/08/2015   Procedure: COLONOSCOPY WITH PROPOFOL;  Surgeon: SChristene Lye MD;  Location: [facility] ENDOSCOPY;  Service: Endoscopy;  Laterality: N/A;  . COLONOSCOPY WITH PROPOFOL N/A 12/06/2017   Procedure: COLONOSCOPY WITH PROPOFOL;  Surgeon: AJonathon Bellows MD;  [facility];  Service: Gastroenterology;  Laterality: N/A;  . COLONOSCOPY WITH PROPOFOL N/A 12/22/2017   Procedure: COLONOSCOPY WITH PROPOFOL;  Surgeon: AJonathon Bellows MD;  [facility];  Service: Gastroenterology;  Laterality: N/A;  . ESOPHAGOGASTRODUODENOSCOPY (EGD) WITH PROPOFOL  N/A 12/08/2015   Procedure: ESOPHAGOGASTRODUODENOSCOPY (EGD) WITH PROPOFOL;  Surgeon: Christene Lye, MD;  Location: [facility] ENDOSCOPY;  Service: Endoscopy;  Laterality: N/A;  . ESOPHAGOGASTRODUODENOSCOPY (EGD) WITH PROPOFOL N/A 12/06/2017   Procedure: ESOPHAGOGASTRODUODENOSCOPY (EGD) WITH PROPOFOL;  Surgeon: Jonathon Bellows, MD;  Location: [facility] ENDOSCOPY;  Service: Gastroenterology;  Laterality: N/A;  . ESOPHAGOGASTRODUODENOSCOPY (EGD) WITH PROPOFOL N/A 12/22/2017   Procedure: ESOPHAGOGASTRODUODENOSCOPY (EGD) WITH  PROPOFOL;  Surgeon: Jonathon Bellows, MD;  Location: [facility] ENDOSCOPY;  Service: Gastroenterology;  Laterality: N/A;  . EYE SURGERY Right 2014  . GIVENS CAPSULE STUDY N/A 01/23/2018   Procedure: GIVENS CAPSULE STUDY;  Surgeon: Jonathon Bellows, MD;  Location: [facility] ENDOSCOPY;  Service: Gastroenterology;  Laterality: N/A;  . post radiation therapy Right    right breast cancer 2002  . Stab pheblectomy   2010  . vein closure procedure Bilateral 2008   Family History  Problem Relation Age of Onset  . Heart attack Mother   . Heart attack Father   . Heart attack Sister   . Stroke Sister   . Heart attack Brother   . Other Brother        cerebral hemorrhage, sepsis  . Breast cancer Cousin 10       breast/maternal  . Cancer Cousin 81       breast/maternal  . Breast cancer Maternal Aunt 70  . Breast cancer Other 48       maternal neice   Social History   Socioeconomic History  . Marital status: Married    Spouse name: Not on file  . Number of children: 2  . Years of education: Not on file  . Highest education level: Bachelor's degree (e.g., BA, AB, BS)  Occupational History  . Occupation: retired  Scientific laboratory technician  . Financial resource strain: Not hard at all  . Food insecurity    Worry: Never true    Inability: Never true  . Transportation needs    Medical: No    Non-medical: No  Tobacco Use  . Smoking status: Never Smoker  . Smokeless tobacco: Never Used  Substance and Sexual Activity  . Alcohol use: No  . Drug use: No  . Sexual activity: Never  Lifestyle  . Physical activity    Days per week: 0 days    Minutes per session: 0 min  . Stress: Not at all  Relationships  . Social Herbalist on phone: Patient refused    Gets together: Patient refused    Attends religious service: Patient refused    Active member of club or organization: Patient refused    Attends meetings of clubs or organizations: Patient refused    Relationship status: Patient refused  Other Topics Concern   . Not on file  Social History Narrative  . Not on file    Outpatient Encounter Medications as of 06/11/2019  Medication Sig  . atorvastatin (LIPITOR) 10 MG tablet Take 10 mg by mouth daily.  . calcium citrate (CALCITRATE - DOSED IN MG ELEMENTAL CALCIUM) 950 MG tablet Take 200 mg of elemental calcium by mouth 2 (two) times daily.   . clotrimazole-betamethasone (LOTRISONE) cream Apply 1 application topically 2 (two) times daily as needed.  . docusate sodium (STOOL SOFTENER) 100 MG capsule Take 100 mg by mouth daily as needed for mild constipation.  Marland Kitchen doxycycline (DORYX) 100 MG EC tablet Take 100 mg by mouth 2 (two) times daily.  . Eflornithine HCl (VANIQA) 13.9 % cream Apply 1 Dose topically daily as needed.  Marland Kitchen  ezetimibe (ZETIA) 10 MG tablet Take 10 mg by mouth daily.  . ferrous sulfate 325 (65 FE) MG EC tablet Take 1 tablet (325 mg total) by mouth 2 (two) times daily with a meal. (Patient taking differently: Take by mouth daily with breakfast. )  . Glucosamine 750 MG TABS Take by mouth daily.  . meloxicam (MOBIC) 15 MG tablet Take 15 mg by mouth daily.  . metroNIDAZOLE (METROGEL) 0.75 % gel APPLY TO AFFECTED AREA ON FACE BID  . Multiple Vitamins-Minerals (MULTIVITAMIN WITH MINERALS) tablet Take 1 tablet by mouth daily.  . OMEGA-3 FATTY ACIDS PO Take by mouth daily.   Marland Kitchen omeprazole (PRILOSEC) 20 MG capsule TAKE 1 CAPSULE BY MOUTH  DAILY  . ORACEA 40 MG capsule PRN  . psyllium (REGULOID) 0.52 G capsule Take 0.52 g by mouth daily.  . SOOLANTRA 1 % CREA   . spironolactone (ALDACTONE) 50 MG tablet 50 mg daily.   . traMADol (ULTRAM) 50 MG tablet Take 1 tablet (50 mg total) by mouth every 6 (six) hours as needed.  . TURMERIC PO Take by mouth daily.  [name] 625 MG tablet Take 1 tablet by mouth 3 (three) times daily.    No facility-administered encounter medications on file as of 06/11/2019.     Activities of Daily Living In your present state of health, do you have any difficulty performing  the following activities: 06/11/2019 06/10/2019  Hearing? N N  Vision? N N  Difficulty concentrating or making decisions? N Y  Walking or climbing stairs? Y Y  Comment Due to right knee extention. -  Dressing or bathing? N N  Doing errands, shopping? N N  Preparing Food and eating ? N -  Using the Toilet? N -  In the past six months, have you accidently leaked urine? Y -  Comment Has a pessary. -  Do you have problems with loss of bowel control? N -  Managing your Medications? N -  Managing your Finances? N -  Housekeeping or managing your Housekeeping? N -  Some recent data might be hidden    Patient Care Team: Jerrol Banana., MD as PCP - General (Family Medicine) Dingeldein, Remo Lipps, MD as Consulting Physician (Ophthalmology) Gae Dry, MD as Referring Physician (Obstetrics and Gynecology) Earlie Server, MD as Consulting Physician (Oncology) Thomes Dinning, MD as Referring Physician (Internal Medicine)    Assessment:   This is a routine wellness examination for Rafaela.  Exercise Activities and Dietary recommendations Current Exercise Habits: The patient does not participate in regular exercise at present, Exercise limited by: None identified  Goals    . DIET - INCREASE WATER INTAKE     Recommend increasing water intake to 4-6 glasses a day and decreasing soda and tea intake.     Marland Kitchen Exercise      Recommend increasing exercise by walking 4 days a week for 25 minutes.     Marland Kitchen LIFESTYLE - DECREASE FALLS RISK     Recommend to remove any items from the home that may cause slips or trips.       Fall Risk: Fall Risk  06/11/2019 06/10/2019 08/09/2018 02/20/2018 01/10/2018  Falls in the past year? 1 1 0 No No  Number falls in past yr: 0 0 - - -  Injury with Fall? 0 0 - - -  Comment - - - - -  Follow up Falls prevention discussed - - - -    FALL RISK PREVENTION PERTAINING TO THE HOME:  Any stairs in or around the home? Yes  If so, are there any without handrails? No    Home free of loose throw rugs in walkways, pet beds, electrical cords, etc? Yes  Adequate lighting in your home to reduce risk of falls? Yes   ASSISTIVE DEVICES UTILIZED TO PREVENT FALLS:  Life alert? No  Use of a cane, walker or w/c? No  Grab bars in the bathroom? Yes  Shower chair or bench in shower? No  Elevated toilet seat or a handicapped toilet? No    TIMED UP AND GO:  Was the test performed? No .    Depression Screen PHQ 2/9 Scores 06/11/2019 06/10/2019 02/20/2018 12/13/2016  PHQ - 2 Score 0 0 0 0  PHQ- 9 Score - 1 3 0     Cognitive Function     6CIT Screen 06/11/2019 02/20/2018 12/13/2016  What Year? 0 points 0 points 0 points  What month? 0 points 0 points 0 points  What time? 0 points 0 points 0 points  Count back from 20 0 points 0 points 0 points  Months in reverse 0 points 0 points 0 points  Repeat phrase 0 points 0 points 0 points  Total Score 0 0 0    Immunization History  Administered Date(s) Administered  . Influenza, High Dose Seasonal PF 05/28/2015, 07/05/2017, 06/10/2019  . Influenza,inj,Quad PF,6+ Mos 06/27/2018  . Influenza-Unspecified 05/09/2016  . Pneumococcal Conjugate-13 04/22/2014  . Pneumococcal Polysaccharide-23 03/15/2011  . Td 09/06/2007, 12/30/2012  . Tdap 04/22/2014  . Zoster 06/27/2011    Qualifies for Shingles Vaccine? Yes  Zostavax completed 06/27/11. Due for Shingrix. Education has been provided regarding the importance of this vaccine. Pt has been advised to call insurance company to determine out of pocket expense. Advised may also receive vaccine at local pharmacy or Health Dept. Verbalized acceptance and understanding.  Tdap: Although this vaccine is not a covered service during a Wellness Exam, does the patient still wish to receive this vaccine today?  No .   Flu Vaccine: Up to date  Pneumococcal Vaccine: Completed series  Screening Tests Health Maintenance  Topic Date Due  . TETANUS/TDAP  04/22/2024  . INFLUENZA VACCINE   Completed  . DEXA SCAN  Completed  . PNA vac Low Risk Adult  Completed    Cancer Screenings:  Colorectal Screening: No longer required.   Mammogram: No longer required.   Bone Density: Completed 04/05/12. Results reflect NORMAL. No repeat needed unless advised by a physician.   Lung Cancer Screening: (Low Dose CT Chest recommended if Age 52-80 years, 30 pack-year currently smoking OR have quit w/in 15years.) does not qualify.   Additional Screening:  Vision Screening: Recommended annual ophthalmology exams for early detection of glaucoma and other disorders of the eye.  Dental Screening: Recommended annual dental exams for proper oral hygiene  Community Resource Referral:  CRR required this visit?  No       Plan:  I have personally reviewed and addressed the Medicare Annual Wellness questionnaire and have noted the following in the patient's chart:  A. Medical and social history B. Use of alcohol, tobacco or illicit drugs  C. Current medications and supplements D. Functional ability and status E.  Nutritional status F.  Physical activity G. Advance directives H. List of other physicians I.  Hospitalizations, surgeries, and ER visits in previous 12 months J.  [place] such as hearing and vision if needed, cognitive and depression L. Referrals and appointments   In addition,  I have reviewed and discussed with patient certain preventive protocols, quality metrics, and best practice recommendations. A written personalized care plan for preventive services as well as general preventive health recommendations were provided to patient. Nurse Health Advisor  Signed,    Hildagard Sobecki [place], [place]  [id number] Nurse Health Advisor   Nurse Notes: None.

## 2019-06-11 ENCOUNTER — Ambulatory Visit (INDEPENDENT_AMBULATORY_CARE_PROVIDER_SITE_OTHER): Payer: Medicare Other

## 2019-06-11 DIAGNOSIS — Z0000 Encounter for general adult medical examination without abnormal findings: Secondary | ICD-10-CM

## 2019-06-11 LAB — CBC WITH DIFFERENTIAL/PLATELET
Basophils Absolute: 0 10*3/uL (ref 0.0–0.2)
Basos: 1 %
EOS (ABSOLUTE): 0.3 10*3/uL (ref 0.0–0.4)
Eos: 5 %
Hematocrit: 41.5 % (ref 34.0–46.6)
Hemoglobin: 14.5 g/dL (ref 11.1–15.9)
Immature Grans (Abs): 0 10*3/uL (ref 0.0–0.1)
Immature Granulocytes: 0 %
Lymphocytes Absolute: 1.5 10*3/uL (ref 0.7–3.1)
Lymphs: 23 %
MCH: 31.2 pg (ref 26.6–33.0)
MCHC: 34.9 g/dL (ref 31.5–35.7)
MCV: 89 fL (ref 79–97)
Monocytes Absolute: 0.7 10*3/uL (ref 0.1–0.9)
Monocytes: 11 %
Neutrophils Absolute: 4 10*3/uL (ref 1.4–7.0)
Neutrophils: 60 %
Platelets: 365 10*3/uL (ref 150–450)
RBC: 4.65 x10E6/uL (ref 3.77–5.28)
RDW: 12.2 % (ref 11.7–15.4)
WBC: 6.5 10*3/uL (ref 3.4–10.8)

## 2019-06-11 LAB — COMP. METABOLIC PANEL (12)
AST: 17 IU/L (ref 0–40)
Albumin/Globulin Ratio: 2.1 (ref 1.2–2.2)
Albumin: 4.9 g/dL — ABNORMAL HIGH (ref 3.7–4.7)
Alkaline Phosphatase: 75 IU/L (ref 39–117)
BUN/Creatinine Ratio: 18 (ref 12–28)
BUN: 16 mg/dL (ref 8–27)
Bilirubin Total: 1.3 mg/dL — ABNORMAL HIGH (ref 0.0–1.2)
Calcium: 9.6 mg/dL (ref 8.7–10.3)
Chloride: 103 mmol/L (ref 96–106)
Creatinine, Ser: 0.9 mg/dL (ref 0.57–1.00)
GFR calc Af Amer: 71 mL/min/{1.73_m2} (ref >59)
GFR calc non Af Amer: 61 mL/min/{1.73_m2} (ref >59)
Globulin, Total: 2.3 g/dL (ref 1.5–4.5)
Glucose: 99 mg/dL (ref 65–99)
Potassium: 4.3 mmol/L (ref 3.5–5.2)
Sodium: 142 mmol/L (ref 134–144)
Total Protein: 7.2 g/dL (ref 6.0–8.5)

## 2019-06-11 LAB — TSH: TSH: 2.11 u[IU]/mL (ref 0.450–4.500)

## 2019-06-11 LAB — LIPID PANEL
Chol/HDL Ratio: 2.6 ratio (ref 0.0–4.4)
Cholesterol, Total: 121 mg/dL (ref 100–199)
HDL: 47 mg/dL (ref >39)
LDL Chol Calc (NIH): 59 mg/dL (ref 0–99)
Triglycerides: 71 mg/dL (ref 0–149)
VLDL Cholesterol Cal: 15 mg/dL (ref 5–40)

## 2019-06-11 LAB — IRON,TIBC AND FERRITIN PANEL
Ferritin: 109 ng/mL (ref 15–150)
Iron Saturation: 37 % (ref 15–55)
Iron: 115 ug/dL (ref 27–139)
Total Iron Binding Capacity: 314 ug/dL (ref 250–450)
UIBC: 199 ug/dL (ref 118–369)

## 2019-06-11 NOTE — Patient Instructions (Addendum)
Claire Kane , Thank you for taking time to come for your Medicare Wellness Visit. I appreciate your ongoing commitment to your health goals. Please review the following plan we discussed and let me know if I can assist you in the future.   Screening recommendations/referrals: Colonoscopy: No longer required.  Mammogram: No longer required.  Bone Density: Up to date, previous DEXA was normal. No repeat needed unless advised by a physician.  Recommended yearly ophthalmology/optometry visit for glaucoma screening and checkup Recommended yearly dental visit for hygiene and checkup  Vaccinations: Influenza vaccine: Currently due Pneumococcal vaccine: Completed series Tdap vaccine: Up to date, due 04/2024 Shingles vaccine: Pt declines today.     Advanced directives: Please bring a copy of your POA (Power of Attorney) and/or Living Will to your next appointment.   Conditions/risks identified: Fall risk prevention discussed. Continue to increase water intake and exercise 3 days a week for at least 30 minutes at a time.   Next appointment: 12/10/19 @ 9:20 AM with Dr Rosanna Randy. Declined scheduling an AWv for 2021 at this time.    Preventive Care 78 Years and Older, Female Preventive care refers to lifestyle choices and visits with your health care provider that can promote health and wellness. What does preventive care include?  A yearly physical exam. This is also called an annual well check.  Dental exams once or twice a year.  Routine eye exams. Ask your health care provider how often you should have your eyes checked.  Personal lifestyle choices, including:  Daily care of your teeth and gums.  Regular physical activity.  Eating a healthy diet.  Avoiding tobacco and drug use.  Limiting alcohol use.  Practicing safe sex.  Taking low-dose aspirin every day.  Taking vitamin and mineral supplements as recommended by your health care provider. What happens during an annual well check?  The services and screenings done by your health care provider during your annual well check will depend on your age, overall health, lifestyle risk factors, and family history of disease. Counseling  Your health care provider may ask you questions about your:  Alcohol use.  Tobacco use.  Drug use.  Emotional well-being.  Home and relationship well-being.  Sexual activity.  Eating habits.  History of falls.  Memory and ability to understand (cognition).  Work and work Statistician.  Reproductive health. Screening  You may have the following tests or measurements:  Height, weight, and BMI.  Blood pressure.  Lipid and cholesterol levels. These may be checked every 5 years, or more frequently if you are over 30 years old.  Skin check.  Lung cancer screening. You may have this screening every year starting at age 43 if you have a 30-pack-year history of smoking and currently smoke or have quit within the past 15 years.  Fecal occult blood test (FOBT) of the stool. You may have this test every year starting at age 55.  Flexible sigmoidoscopy or colonoscopy. You may have a sigmoidoscopy every 5 years or a colonoscopy every 10 years starting at age 77.  Hepatitis C blood test.  Hepatitis B blood test.  Sexually transmitted disease (STD) testing.  Diabetes screening. This is done by checking your blood sugar (glucose) after you have not eaten for a while (fasting). You may have this done every 1-3 years.  Bone density scan. This is done to screen for osteoporosis. You may have this done starting at age 106.  Mammogram. This may be done every 1-2 years. Talk to your  health care provider about how often you should have regular mammograms. Talk with your health care provider about your test results, treatment options, and if necessary, the need for more tests. Vaccines  Your health care provider may recommend certain vaccines, such as:  Influenza vaccine. This is  recommended every year.  Tetanus, diphtheria, and acellular pertussis (Tdap, Td) vaccine. You may need a Td booster every 10 years.  Zoster vaccine. You may need this after age 44.  Pneumococcal 13-valent conjugate (PCV13) vaccine. One dose is recommended after age 53.  Pneumococcal polysaccharide (PPSV23) vaccine. One dose is recommended after age 26. Talk to your health care provider about which screenings and vaccines you need and how often you need them. This information is not intended to replace advice given to you by your health care provider. Make sure you discuss any questions you have with your health care provider. Document Released: 10/02/2015 Document Revised: 05/25/2016 Document Reviewed: 07/07/2015 Elsevier Interactive Patient Education  2017 [place] Prevention in the Home Falls can cause injuries. They can happen to people of all ages. There are many things you can do to make your home safe and to help prevent falls. What can I do on the outside of my home?  Regularly fix the edges of walkways and driveways and fix any cracks.  Remove anything that might make you trip as you walk through a door, such as a raised step or threshold.  Trim any bushes or trees on the path to your home.  Use bright outdoor lighting.  Clear any walking paths of anything that might make someone trip, such as rocks or tools.  Regularly check to see if handrails are loose or broken. Make sure that both sides of any steps have handrails.  Any raised decks and porches should have guardrails on the edges.  Have any leaves, snow, or ice cleared regularly.  Use sand or salt on walking paths during winter.  Clean up any spills in your garage right away. This includes oil or grease spills. What can I do in the bathroom?  Use night lights.  Install grab bars by the toilet and in the tub and shower. Do not use towel bars as grab bars.  Use non-skid mats or decals in the tub or  shower.  If you need to sit down in the shower, use a plastic, non-slip stool.  Keep the floor dry. Clean up any water that spills on the floor as soon as it happens.  Remove soap buildup in the tub or shower regularly.  Attach bath mats securely with double-sided non-slip rug tape.  Do not have throw rugs and other things on the floor that can make you trip. What can I do in the bedroom?  Use night lights.  Make sure that you have a light by your bed that is easy to reach.  Do not use any sheets or blankets that are too big for your bed. They should not hang down onto the floor.  Have a firm chair that has side arms. You can use this for support while you get dressed.  Do not have throw rugs and other things on the floor that can make you trip. What can I do in the kitchen?  Clean up any spills right away.  Avoid walking on wet floors.  Keep items that you use a lot in easy-to-reach places.  If you need to reach something above you, use a strong step stool that has a  grab bar.  Keep electrical cords out of the way.  Do not use floor polish or wax that makes floors slippery. If you must use wax, use non-skid floor wax.  Do not have throw rugs and other things on the floor that can make you trip. What can I do with my stairs?  Do not leave any items on the stairs.  Make sure that there are handrails on both sides of the stairs and use them. Fix handrails that are broken or loose. Make sure that handrails are as long as the stairways.  Check any carpeting to make sure that it is firmly attached to the stairs. Fix any carpet that is loose or worn.  Avoid having throw rugs at the top or bottom of the stairs. If you do have throw rugs, attach them to the floor with carpet tape.  Make sure that you have a light switch at the top of the stairs and the bottom of the stairs. If you do not have them, ask someone to add them for you. What else can I do to help prevent falls?   Wear shoes that:  Do not have high heels.  Have rubber bottoms.  Are comfortable and fit you well.  Are closed at the toe. Do not wear sandals.  If you use a stepladder:  Make sure that it is fully opened. Do not climb a closed stepladder.  Make sure that both sides of the stepladder are locked into place.  Ask someone to hold it for you, if possible.  Clearly mark and make sure that you can see:  Any grab bars or handrails.  First and last steps.  Where the edge of each step is.  Use tools that help you move around (mobility aids) if they are needed. These include:  Canes.  Walkers.  Scooters.  Crutches.  Turn on the lights when you go into a dark area. Replace any light bulbs as soon as they burn out.  Set up your furniture so you have a clear path. Avoid moving your furniture around.  If any of your floors are uneven, fix them.  If there are any pets around you, be aware of where they are.  Review your medicines with your doctor. Some medicines can make you feel dizzy. This can increase your chance of falling. Ask your doctor what other things that you can do to help prevent falls. This information is not intended to replace advice given to you by your health care provider. Make sure you discuss any questions you have with your health care provider. Document Released: 07/02/2009 Document Revised: 02/11/2016 Document Reviewed: 10/10/2014 Elsevier Interactive Patient Education  2017 [employer].

## 2019-06-13 ENCOUNTER — Encounter: Payer: Medicare Other | Admitting: Family Medicine

## 2019-06-17 ENCOUNTER — Ambulatory Visit: Payer: Self-pay

## 2019-06-17 ENCOUNTER — Encounter: Payer: Self-pay | Admitting: Family Medicine

## 2019-06-22 ENCOUNTER — Other Ambulatory Visit: Payer: Self-pay | Admitting: Family Medicine

## 2019-08-08 ENCOUNTER — Ambulatory Visit
Admission: RE | Admit: 2019-08-08 | Discharge: 2019-08-08 | Disposition: A | Discharge status: Home / Self Care | Payer: Medicare Other | Source: Ambulatory Visit | Attending: Obstetrics & Gynecology | Admitting: Obstetrics & Gynecology

## 2019-08-08 DIAGNOSIS — Z1231 Encounter for screening mammogram for malignant neoplasm of breast: Secondary | ICD-10-CM | POA: Diagnosis present

## 2019-08-08 DIAGNOSIS — Z1239 Encounter for other screening for malignant neoplasm of breast: Secondary | ICD-10-CM

## 2019-08-29 ENCOUNTER — Ambulatory Visit: Payer: Medicare Other | Admitting: Podiatry

## 2019-08-29 ENCOUNTER — Encounter: Payer: Self-pay | Admitting: Podiatry

## 2019-08-29 ENCOUNTER — Other Ambulatory Visit: Payer: Self-pay

## 2019-08-29 ENCOUNTER — Ambulatory Visit (INDEPENDENT_AMBULATORY_CARE_PROVIDER_SITE_OTHER): Payer: Medicare Other

## 2019-08-29 VITALS — BP 112/85

## 2019-08-29 DIAGNOSIS — M79671 Pain in right foot: Secondary | ICD-10-CM

## 2019-08-29 DIAGNOSIS — M7671 Peroneal tendinitis, right leg: Secondary | ICD-10-CM | POA: Diagnosis not present

## 2019-08-29 DIAGNOSIS — M7672 Peroneal tendinitis, left leg: Secondary | ICD-10-CM

## 2019-08-29 DIAGNOSIS — M79672 Pain in left foot: Secondary | ICD-10-CM

## 2019-08-29 DIAGNOSIS — M722 Plantar fascial fibromatosis: Secondary | ICD-10-CM | POA: Diagnosis not present

## 2019-08-29 NOTE — Patient Instructions (Signed)

## 2019-08-30 ENCOUNTER — Encounter: Payer: Self-pay | Admitting: Podiatry

## 2019-08-30 NOTE — Progress Notes (Signed)
Subjective:  Patient ID: Claire Kane, female    DOB: [birth year],  MRN: [id number]  Chief Complaint  Patient presents with  . Foot Pain    pt has bil foot pain located at the bottom of the archer, pt also states that it is also painful to the touch, pt states that the foot pain has been going on for up to a year, and states that she has had it treated in the past    78 y.o. female presents with the above complaint.  Patient presents with left plantar fasciitis pain.  Patient states it hurts on the bottom of the heel and the arch as well.  It has been going on for about a year.  Patient states is gotten it treated in the past but it appears that it has come back.  Patient is also concerned with custom-made orthotics she had made 25 years ago and wanted to know if they are in good condition.  She has wore them occasionally but not consistently throughout the 25 years.  Patient states the pain is shooting and throbbing and aching.  Patient denies any other acute complaints.  Patient has not seen anyone else for this in recent times.  Patient ambulates in regular sneakers.   Review of Systems: Negative except as noted in the HPI. Denies N/V/F/Ch.  Past Medical History:  Diagnosis Date  . Anemia   . Anxiety   . Arthritis   . 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 Cataract   . GERD (gastroesophageal reflux disease)   . Heart murmur 2001  . Hiatal hernia   . High cholesterol   . Hypertension   . Personal history of radiation therapy 2002   BREAST CA  . Rosacea   . 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 complications     Current Outpatient Medications:  .  atorvastatin (LIPITOR) 10 MG tablet, Take 10 mg by mouth daily., Disp: , Rfl:  .  calcium citrate (CALCITRATE - DOSED IN MG ELEMENTAL CALCIUM) 950 MG tablet, Take 200 mg of elemental calcium by mouth 2 (two) times daily. , Disp: , Rfl:  .  clotrimazole-betamethasone  (LOTRISONE) cream, Apply 1 application topically 2 (two) times daily as needed., Disp: 45 g, Rfl: 1 .  docusate sodium (STOOL SOFTENER) 100 MG capsule, Take 100 mg by mouth daily as needed for mild constipation., Disp: , Rfl:  .  doxycycline (DORYX) 100 MG EC tablet, Take 100 mg by mouth 2 (two) times daily., Disp: , Rfl:  .  doxycycline (VIBRA-TABS) 100 MG tablet, Take 100 mg by mouth 2 (two) times daily., Disp: , Rfl:  .  Eflornithine HCl (VANIQA) 13.9 % cream, Apply 1 Dose topically daily as needed., Disp: 45 g, Rfl: 3 .  ezetimibe (ZETIA) 10 MG tablet, Take 10 mg by mouth daily., Disp: , Rfl:  .  ferrous sulfate 325 (65 FE) MG EC tablet, Take 1 tablet (325 mg total) by mouth 2 (two) times daily with a meal. (Patient taking differently: Take by mouth daily with breakfast. ), Disp: 180 tablet, Rfl: 1 .  Glucosamine 750 MG TABS, Take by mouth daily., Disp: , Rfl:  .  meloxicam (MOBIC) 15 MG tablet, Take 15 mg by mouth daily., Disp: , Rfl:  .  metroNIDAZOLE (METROGEL) 0.75 % gel, APPLY TO AFFECTED AREA ON FACE BID, Disp: , Rfl: 3 .  Multiple Vitamins-Minerals (MULTIVITAMIN WITH  MINERALS) tablet, Take 1 tablet by mouth daily., Disp: , Rfl:  .  OMEGA-3 FATTY ACIDS PO, Take by mouth daily. , Disp: , Rfl:  .  omeprazole (PRILOSEC) 20 MG capsule, TAKE 1 CAPSULE BY MOUTH  DAILY, Disp: 90 capsule, Rfl: 3 .  ORACEA 40 MG capsule, PRN, Disp: , Rfl:  .  psyllium (REGULOID) 0.52 G capsule, Take 0.52 g by mouth daily., Disp: , Rfl:  .  SOOLANTRA 1 % CREA, , Disp: , Rfl:  .  spironolactone (ALDACTONE) 50 MG tablet, 50 mg daily. , Disp: , Rfl:  .  traMADol (ULTRAM) 50 MG tablet, TAKE 1 TABLET BY MOUTH  EVERY 6 HOURS AS NEEDED, Disp: 100 tablet, Rfl: 2 .  TURMERIC PO, Take by mouth daily., Disp: , Rfl:  .  WELCHOL 625 MG tablet, Take 1 tablet by mouth 3 (three) times daily. , Disp: , Rfl:   Social History   Tobacco Use  Smoking Status Never Smoker  Smokeless Tobacco Never Used    No Known  Allergies Objective:   Vitals:   08/29/19 1505  BP: 112/85   There is no height or weight on file to calculate BMI. Constitutional Well developed. Well nourished.  Vascular Dorsalis pedis pulses palpable bilaterally. Posterior tibial pulses palpable bilaterally. Capillary refill normal to all digits.  No cyanosis or clubbing noted. Pedal hair growth normal.  Neurologic Normal speech. Oriented to person, place, and time. Epicritic sensation to light touch grossly present bilaterally.  Dermatologic Nails well groomed and normal in appearance. No open wounds. No skin lesions.  Orthopedic: Normal joint ROM without pain or crepitus bilaterally. No visible deformities. Pain on palpation to the left peroneal tendon insertion at the fifth metatarsal base.  Pain on resisted eversion and pain on inversion of the foot. Tender to palpation at the calcaneal tuber left. No pain with calcaneal squeeze left. Ankle ROM full range of motion left. Silfverskiold Test: negative left.   Radiographs: Taken and reviewed. No acute fractures or dislocations. No evidence of stress fracture.  Plantar heel spur present. Posterior heel spur absent.   Assessment:   1. Pain of left heel   2. Peroneal tendinitis of left lower leg   3. Plantar fasciitis of left foot    Plan:  Patient was evaluated and treated and all questions answered.  Plantar Fasciitis, left - XR reviewed as above.  - Educated on icing and stretching. Instructions given.  - Injection delivered to the plantar fascia as below. - DME: Plantar Fascial Brace - Pharmacologic management:  Educated on risks/benefits and proper taking of medication.  Left peroneal tendinitis -Patient has pain right at the insertion of the left peroneal tendon.  I believe that this is due to compensate Tory mechanism that is putting a lot of pressure on the peroneal tendon when she is ambulating and guarding gets the pain from the heel.  Given that the this  pain started after the heel pain I believe that is the likely possibility.  For now I will hold off on treating it however once the heel pain is treated and patient still has peroneal tendon pain I will address it accordingly.  Procedure: Injection Tendon/Ligament Location: Left plantar fascia at the glabrous junction; medial approach. Skin Prep: alcohol Injectate: 0.5 cc 0.5% marcaine plain, 0.5 cc of 1% Lidocaine, 0.5 cc kenalog 10. Disposition: Patient tolerated procedure well. Injection site dressed with a band-aid.  Return in about 4 weeks (around 09/26/2019).

## 2019-09-03 ENCOUNTER — Other Ambulatory Visit: Payer: Self-pay | Admitting: Podiatry

## 2019-09-03 DIAGNOSIS — M7672 Peroneal tendinitis, left leg: Secondary | ICD-10-CM

## 2019-09-03 DIAGNOSIS — M7671 Peroneal tendinitis, right leg: Secondary | ICD-10-CM

## 2019-09-24 ENCOUNTER — Encounter: Payer: Self-pay | Admitting: Obstetrics & Gynecology

## 2019-09-24 ENCOUNTER — Other Ambulatory Visit: Payer: Self-pay

## 2019-09-24 ENCOUNTER — Ambulatory Visit: Payer: Medicare PPO | Admitting: Obstetrics & Gynecology

## 2019-09-24 VITALS — BP 130/80 | Ht 64.0 in | Wt 186.0 lb

## 2019-09-24 DIAGNOSIS — R32 Unspecified urinary incontinence: Secondary | ICD-10-CM | POA: Diagnosis not present

## 2019-09-24 DIAGNOSIS — R3915 Urgency of urination: Secondary | ICD-10-CM

## 2019-09-24 DIAGNOSIS — N8111 Cystocele, midline: Secondary | ICD-10-CM | POA: Diagnosis not present

## 2019-09-24 DIAGNOSIS — N993 Prolapse of vaginal vault after hysterectomy: Secondary | ICD-10-CM | POA: Diagnosis not present

## 2019-09-24 MED ORDER — MIRABEGRON ER 25 MG PO TB24: 25.0000 mg | ORAL_TABLET | Freq: Every day | ORAL | 6 refills | Status: DC

## 2019-09-24 NOTE — Progress Notes (Signed)
HPI:      [name] is a 79 y.o. [id number] who presents today for her pessary follow up and examination related to her pelvic floor weakening.  Pt reports tolerating the pessary well with  no vaginal bleeding and  no vaginal discharge.  Symptoms of pelvic floor weakening have greatly improved. She is voiding and defecating without difficulty. She currently has a Cup #4 pessary.  PMHx: She  has a past medical history of Anemia, Anxiety, Arthritis, BRCA negative (08/04/2015), Breast cancer ([place]) (2002), Cataract, GERD (gastroesophageal reflux disease), Heart murmur (2001), Hiatal hernia, High cholesterol, Hypertension, Personal history of radiation therapy (2002), Rosacea, and 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 complications. Also,  has a past surgical history that includes Stab pheblectomy  (2010); vein closure procedure (Bilateral, 2008); Abdominal hysterectomy; Cholecystectomy; Breast surgery (Right, 2002); Eye surgery (Right, 2014); Colonoscopy (2013); post radiation therapy (Right); Colonoscopy with propofol (N/A, 12/08/2015); Esophagogastroduodenoscopy (egd) with propofol (N/A, 12/08/2015); Colonoscopy with propofol (N/A, 12/06/2017); Esophagogastroduodenoscopy (egd) with propofol (N/A, 12/06/2017); Colonoscopy with propofol (N/A, 12/22/2017); Esophagogastroduodenoscopy (egd) with propofol (N/A, 12/22/2017); Givens capsule study (N/A, 01/23/2018); Breast lumpectomy (Right, 2002); Breast excisional biopsy (Right, 2002); and Breast cyst aspiration (Left, 2003)., family history includes Breast cancer in her cousin; Breast cancer (age of onset: 68) in an other family member; Breast cancer (age of onset: 5) in her cousin; Breast cancer (age of onset: 49) in her maternal aunt; Cancer (age of onset: 68) in her cousin; Heart attack in her brother, father, mother, and sister; Other in her brother; Stroke in her sister.,  reports that she has never smoked. She has never used smokeless tobacco. She reports  that she does not drink alcohol or use drugs.  She has a current medication list which includes the following prescription(s): atorvastatin, calcium citrate, clotrimazole-betamethasone, docusate sodium, doxycycline, doxycycline, vaniqa, ezetimibe, ferrous sulfate, glucosamine, meloxicam, metronidazole, multivitamin with minerals, omega-3 fatty acids, omeprazole, oracea, psyllium, soolantra, spironolactone, tramadol, turmeric, welchol, and mirabegron er. Also, has No Known Allergies.  Review of Systems  Genitourinary: Positive for frequency and urgency.  All other systems reviewed and are negative.   Objective: BP 130/80   Ht _0  (1.626 m)   Wt 186 lb (84.4 kg)   BMI 31.93 kg/m  Physical Exam Constitutional:      General: She is not in acute distress.    Appearance: She is well-developed.  Genitourinary:     Pelvic exam was performed with patient supine.     Vagina normal.     No vaginal erythema or bleeding.     Genitourinary Comments: Cuff intact/ no lesions  Absent uterus and cervix  HENT:     Head: Normocephalic and atraumatic.     Nose: Nose normal.  Abdominal:     General: There is no distension.     Palpations: Abdomen is soft.     Tenderness: There is no abdominal tenderness.  Musculoskeletal:        General: Normal range of motion.  Neurological:     Mental Status: She is alert and oriented to person, place, and time.     Cranial Nerves: No cranial nerve deficit.  Skin:    General: Skin is warm and dry.  Psychiatric:        Attention and Perception: Attention normal.        Mood and Affect: Mood normal.        Speech: Speech normal.        Behavior: Behavior normal.  Cognition and Memory: Cognition normal.        Judgment: Judgment normal.     Pessary Care Pessary removed and cleaned.  Vagina checked - without erosions - pessary replaced.  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 Cystocele, midline  N81.11     3. Urinary urgency  R39.15 mirabegron ER (MYRBETRIQ) 25 MG TB24 tablet  4. Urinary incontinence in female  R32 mirabegron ER (MYRBETRIQ) 25 MG TB24 tablet   Will try Myrbetriq as Oxybutynin causes too much dry mouth  Pessary was cleaned and replaced today. Cup type, she removes and cleans monthly. Instructions given for care. Concerning symptoms to observe for are counseled to patient. Follow up scheduled for 6 months. Breast exam nv, [facility] UTD 07/2019  A total of 15 minutes were spent face-to-face with the patient during this encounter and over half of that time dealt with counseling and coordination of care.  Barnett Applebaum, MD, [facility], [facility] 09/24/2019  10:05 AM

## 2019-09-24 NOTE — Patient Instructions (Signed)
Mirabegron extended-release tablets What is this medicine? MIRABEGRON (MIR a BEG ron) is used to treat overactive bladder. This medicine reduces the amount of bathroom visits. It may also help to control wetting accidents. It may be used alone, but sometimes may be given with other treatments. This medicine may be used for other purposes; ask your health care provider or pharmacist if you have questions. COMMON BRAND NAME(S): Myrbetriq What should I tell my health care provider before I take this medicine? They need to know if you have any of these conditions:  high blood pressure  kidney disease  liver disease  problems urinating  prostate disease  an unusual or allergic reaction to mirabegron, other medicines, foods, dyes, or preservatives  pregnant or trying to get pregnant  breast-feeding How should I use this medicine? Take this medicine by mouth with a glass of water. Follow the directions on the prescription label. Do not cut, crush or chew this medicine. You can take it with or without food. If it upsets your stomach, take it with food. Take your medicine at regular intervals. Do not take it more often than directed. Do not stop taking except on your doctor's advice. Talk to your pediatrician regarding the use of this medicine in children. Special care may be needed. Overdosage: If you think you have taken too much of this medicine contact a poison control center or emergency room at once. NOTE: This medicine is only for you. Do not share this medicine with others. What if I miss a dose? If you miss a dose, take it as soon as you can. If it is almost time for your next dose, take only that dose. Do not take double or extra doses. What may interact with this medicine?  codeine  desipramine  digoxin  flecainide  MAOIs like Carbex, Eldepryl, Marplan, Nardil, and Parnate  methadone  metoprolol  pimozide  propafenone  thioridazine  warfarin This list may not  describe all possible interactions. Give your health care provider a list of all the medicines, herbs, non-prescription drugs, or dietary supplements you use. Also tell them if you smoke, drink alcohol, or use illegal drugs. Some items may interact with your medicine. What should I watch for while using this medicine? Visit your doctor or health care professional for regular checks on your progress. Check your blood pressure as directed. Ask your doctor or health care professional what your blood pressure should be and when you should contact him or her. You may need to limit your intake of tea, coffee, caffeinated sodas, or alcohol. These drinks may make your symptoms worse. What side effects may I notice from receiving this medicine? Side effects that you should report to your doctor or health care professional as soon as possible:  allergic reactions like skin rash, itching or hives, swelling of the face, lips, or tongue  high blood pressure  fast, irregular heartbeat  redness, blistering, peeling or loosening of the skin, including inside the mouth  signs of infection like fever or chills; pain or difficulty passing urine  trouble passing urine or change in the amount of urine Side effects that usually do not require medical attention (report to your doctor or health care professional if they continue or are bothersome):  constipation  dry mouth  headache  runny nose  stomach upset This list may not describe all possible side effects. Call your doctor for medical advice about side effects. You may report side effects to FDA at 1-800-FDA-1088. Where should   I keep my medicine? Keep out of the reach of children. Store at room temperature between 15 and 30 degrees C (59 and 86 degrees F). Throw away any unused medicine after the expiration date. NOTE: This sheet is a summary. It may not cover all possible information. If you have questions about this medicine, talk to your doctor,  pharmacist, or health care provider.  2020 Elsevier/Gold Standard (2017-01-26 11:33:21)  

## 2019-09-26 ENCOUNTER — Ambulatory Visit (INDEPENDENT_AMBULATORY_CARE_PROVIDER_SITE_OTHER): Payer: Medicare PPO | Admitting: Podiatry

## 2019-09-26 ENCOUNTER — Encounter: Payer: Self-pay | Admitting: Podiatry

## 2019-09-26 ENCOUNTER — Other Ambulatory Visit: Payer: Self-pay

## 2019-09-26 DIAGNOSIS — M722 Plantar fascial fibromatosis: Secondary | ICD-10-CM | POA: Diagnosis not present

## 2019-09-26 DIAGNOSIS — M7672 Peroneal tendinitis, left leg: Secondary | ICD-10-CM

## 2019-09-26 DIAGNOSIS — M79672 Pain in left foot: Secondary | ICD-10-CM

## 2019-09-27 ENCOUNTER — Encounter: Payer: Self-pay | Admitting: Podiatry

## 2019-09-27 NOTE — Progress Notes (Signed)
Subjective:  Patient ID: Claire Kane, female    DOB: 11-08-1940,  MRN: [id number]  Chief Complaint  Patient presents with  . Plantar Fasciitis    "my foot is doing better, I got new shoes and they are helping"    79 y.o. female presents with the above complaint.  Patient presents with a follow-up of left plantar fasciitis pain.  She states she is doing incredibly better.  The injections really help resolve the left plantar fasciitis pain.  She said the plantar fascial brace has also helped her a lot she denies any other acute complaints to the left side.  She also is following up from a left peroneal tendinitis.  However that has also resolved completely.  She has been wearing her new shoes she broke the man and is helping her considerably.  I believe she will also benefit from orthotics into the shoes.   Review of Systems: Negative except as noted in the HPI. Denies N/V/F/Ch.  Past Medical History:  Diagnosis Date  . Anemia   . Anxiety   . Arthritis   . 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 Cataract   . GERD (gastroesophageal reflux disease)   . Heart murmur 2001  . Hiatal hernia   . High cholesterol   . Hypertension   . Personal history of radiation therapy 2002   BREAST CA  . Rosacea   . 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 complications     Current Outpatient Medications:  .  atorvastatin (LIPITOR) 10 MG tablet, Take 10 mg by mouth daily., Disp: , Rfl:  .  calcium citrate (CALCITRATE - DOSED IN MG ELEMENTAL CALCIUM) 950 MG tablet, Take 200 mg of elemental calcium by mouth 2 (two) times daily. , Disp: , Rfl:  .  clotrimazole-betamethasone (LOTRISONE) cream, Apply 1 application topically 2 (two) times daily as needed., Disp: 45 g, Rfl: 1 .  docusate sodium (STOOL SOFTENER) 100 MG capsule, Take 100 mg by mouth daily as needed for mild constipation., Disp: , Rfl:  .  doxycycline (DORYX) 100 MG EC tablet,  Take 100 mg by mouth 2 (two) times daily., Disp: , Rfl:  .  doxycycline (VIBRA-TABS) 100 MG tablet, Take 100 mg by mouth 2 (two) times daily., Disp: , Rfl:  .  Eflornithine HCl (VANIQA) 13.9 % cream, Apply 1 Dose topically daily as needed., Disp: 45 g, Rfl: 3 .  ezetimibe (ZETIA) 10 MG tablet, Take 10 mg by mouth daily., Disp: , Rfl:  .  ferrous sulfate 325 (65 FE) MG EC tablet, Take 1 tablet (325 mg total) by mouth 2 (two) times daily with a meal. (Patient taking differently: Take by mouth daily with breakfast. ), Disp: 180 tablet, Rfl: 1 .  Glucosamine 750 MG TABS, Take by mouth daily., Disp: , Rfl:  .  meloxicam (MOBIC) 15 MG tablet, Take 15 mg by mouth daily., Disp: , Rfl:  .  metroNIDAZOLE (METROGEL) 0.75 % gel, APPLY TO AFFECTED AREA ON FACE BID, Disp: , Rfl: 3 .  mirabegron ER (MYRBETRIQ) 25 MG TB24 tablet, Take 1 tablet (25 mg total) by mouth daily., Disp: 30 tablet, Rfl: 6 .  Multiple Vitamins-Minerals (MULTIVITAMIN WITH MINERALS) tablet, Take 1 tablet by mouth daily., Disp: , Rfl:  .  OMEGA-3 FATTY ACIDS PO, Take by mouth daily. , Disp: , Rfl:  .  omeprazole (PRILOSEC) 20 MG capsule, TAKE 1 CAPSULE BY  MOUTH  DAILY, Disp: 90 capsule, Rfl: 3 .  ORACEA 40 MG capsule, PRN, Disp: , Rfl:  .  psyllium (REGULOID) 0.52 G capsule, Take 0.52 g by mouth daily., Disp: , Rfl:  .  SOOLANTRA 1 % CREA, , Disp: , Rfl:  .  spironolactone (ALDACTONE) 50 MG tablet, 50 mg daily. , Disp: , Rfl:  .  traMADol (ULTRAM) 50 MG tablet, TAKE 1 TABLET BY MOUTH  EVERY 6 HOURS AS NEEDED, Disp: 100 tablet, Rfl: 2 .  TURMERIC PO, Take by mouth daily., Disp: , Rfl:  .  WELCHOL 625 MG tablet, Take 1 tablet by mouth 3 (three) times daily. , Disp: , Rfl:   Social History   Tobacco Use  Smoking Status Never Smoker  Smokeless Tobacco Never Used    No Known Allergies Objective:   There were no vitals filed for this visit. There is no height or weight on file to calculate BMI. Constitutional Well developed. Well  nourished.  Vascular Dorsalis pedis pulses palpable bilaterally. Posterior tibial pulses palpable bilaterally. Capillary refill normal to all digits.  No cyanosis or clubbing noted. Pedal hair growth normal.  Neurologic Normal speech. Oriented to person, place, and time. Epicritic sensation to light touch grossly present bilaterally.  Dermatologic Nails well groomed and normal in appearance. No open wounds. No skin lesions.  Orthopedic: Normal joint ROM without pain or crepitus bilaterally. No visible deformities. No pain on palpation to the left peroneal tendon insertion at the fifth metatarsal base.  No pain on resisted eversion and pain on inversion of the foot. No tender to palpation at the calcaneal tuber left. No pain with calcaneal squeeze left. Mild pain along the posterior tibial tendon along the course of the tendon as well as its insertion.  Pain with inversion and eversion of the posterior tibial tendon. Ankle ROM full range of motion left. Silfverskiold Test: negative left.   Radiographs: None  Assessment:   1. Pain of left heel   2. Peroneal tendinitis of left lower leg   3. Plantar fasciitis of left foot    Plan:  Patient was evaluated and treated and all questions answered.  Plantar Fasciitis, left -Resolved  Left peroneal tendinitis -Resolved  Left posterior tibial tendinitis -I explained to the patient the etiology of posterior tibial tendinitis and various treatment options associated with it.  I believe that given her pain is very mild in nature likely due to her collapse of the arch I believe she will benefit from custom-made orthotics to help support the arches of her foot and therefore take the stress off the peroneal and posterior tibial tendon.  This will also indirectly help with the plantar fasciitis as well -Patient is scheduled to see [facility] for [facility]-made orthotics. -If her pain is not resolved when I see her back after she obtains her orthotics  and has tried them on I will consider giving her an injection at the point of maximal tenderness. Return in about 1 week (around 10/03/2019) for Sched with Liliane Channel for [employer].

## 2019-09-30 DIAGNOSIS — H0102A Squamous blepharitis right eye, upper and lower eyelids: Secondary | ICD-10-CM | POA: Diagnosis not present

## 2019-10-15 ENCOUNTER — Other Ambulatory Visit: Payer: Self-pay | Admitting: Family Medicine

## 2019-10-15 MED ORDER — ATORVASTATIN CALCIUM 10 MG PO TABS: 10.0000 mg | ORAL_TABLET | Freq: Every day | ORAL | 1 refills | Status: DC

## 2019-10-15 NOTE — Telephone Encounter (Signed)
[facility] faxed refill request for the following medications:  atorvastatin (LIPITOR) 10 MG tablet   Please advise.

## 2019-10-18 ENCOUNTER — Other Ambulatory Visit: Payer: Self-pay | Admitting: Family Medicine

## 2019-10-18 NOTE — Telephone Encounter (Signed)
[facility] faxed refill request for the following medications:   traMADol (ULTRAM) 50 MG tablet    Please advise.  

## 2019-10-21 MED ORDER — TRAMADOL HCL 50 MG PO TABS: 50.0000 mg | ORAL_TABLET | Freq: Four times a day (QID) | ORAL | 2 refills | Status: DC | PRN

## 2019-10-23 ENCOUNTER — Ambulatory Visit (INDEPENDENT_AMBULATORY_CARE_PROVIDER_SITE_OTHER): Payer: Medicare PPO | Admitting: Orthotics

## 2019-10-23 ENCOUNTER — Other Ambulatory Visit: Payer: Self-pay

## 2019-10-23 DIAGNOSIS — M7672 Peroneal tendinitis, left leg: Secondary | ICD-10-CM

## 2019-10-23 DIAGNOSIS — M722 Plantar fascial fibromatosis: Secondary | ICD-10-CM

## 2019-10-23 DIAGNOSIS — M79672 Pain in left foot: Secondary | ICD-10-CM

## 2019-10-23 NOTE — Progress Notes (Signed)
Patient came into today for casting bilateral f/o to address plantar fasciitis.  Patient reports history of foot pain involving plantar aponeurosis.  Goal is to provide longitudinal arch support and correct any RF instability due to heel eversion/inversion.  Ultimate goal is to relieve tension at pf insertion calcaneal tuberosity.  Plan on semi-rigid device addressing heel stability and relieving PF tension.      Patient signed ABN

## 2019-11-20 ENCOUNTER — Other Ambulatory Visit: Payer: Medicare PPO | Admitting: Orthotics

## 2019-11-21 ENCOUNTER — Ambulatory Visit: Payer: Medicare PPO | Admitting: Podiatry

## 2019-11-26 ENCOUNTER — Other Ambulatory Visit: Payer: Self-pay

## 2019-11-26 ENCOUNTER — Other Ambulatory Visit: Payer: Medicare PPO | Admitting: Orthotics

## 2019-12-09 ENCOUNTER — Ambulatory Visit: Payer: Self-pay | Admitting: Family Medicine

## 2019-12-10 ENCOUNTER — Ambulatory Visit: Payer: Self-pay | Admitting: Family Medicine

## 2019-12-12 ENCOUNTER — Other Ambulatory Visit: Payer: Self-pay

## 2019-12-12 ENCOUNTER — Ambulatory Visit: Payer: Medicare PPO | Admitting: Podiatry

## 2019-12-12 ENCOUNTER — Encounter: Payer: Self-pay | Admitting: Podiatry

## 2019-12-12 VITALS — Temp 97.3°F

## 2019-12-12 DIAGNOSIS — M7752 Other enthesopathy of left foot: Secondary | ICD-10-CM

## 2019-12-12 DIAGNOSIS — M722 Plantar fascial fibromatosis: Secondary | ICD-10-CM

## 2019-12-12 DIAGNOSIS — M7672 Peroneal tendinitis, left leg: Secondary | ICD-10-CM

## 2019-12-12 DIAGNOSIS — L600 Ingrowing nail: Secondary | ICD-10-CM | POA: Diagnosis not present

## 2019-12-13 ENCOUNTER — Encounter: Payer: Self-pay | Admitting: Podiatry

## 2019-12-13 NOTE — Progress Notes (Signed)
Subjective:  Patient ID: Claire Kane, female    DOB: 1940/12/22,  MRN: [id number]  Chief Complaint  Patient presents with  . Foot Pain    follow up left heel    79 y.o. female presents with the above complaint.  Patient follows up from left posterior tibial tendinitis pain.  Patient states she is doing much better overall.  She states the orthotics have been very helpful in terms of controlling her hindfoot motion as and taking the stress off of her foot.  She has started breaking them in.  She states that she still has tenderness at the insertion of the posterior tibial tendon with some inflammation associated with it.  She denies any other acute complaints.  She also has secondary complaint of left hallux ingrown medial border.  It has been very painful in nature.  She has not tried anything for it.  She has tried soaking her foot which has not helped.  She would like to know if this could be taken out.  She denies any other acute complaints.  Review of Systems: Negative except as noted in the HPI. Denies N/V/F/Ch.  Past Medical History:  Diagnosis Date  . Anemia   . Anxiety   . Arthritis   . 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 Cataract   . GERD (gastroesophageal reflux disease)   . Heart murmur 2001  . Hiatal hernia   . High cholesterol   . Hypertension   . Personal history of radiation therapy 2002   BREAST CA  . Rosacea   . 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 complications     Current Outpatient Medications:  .  atorvastatin (LIPITOR) 10 MG tablet, Take 1 tablet (10 mg total) by mouth daily., Disp: 90 tablet, Rfl: 1 .  calcium citrate (CALCITRATE - DOSED IN MG ELEMENTAL CALCIUM) 950 MG tablet, Take 200 mg of elemental calcium by mouth 2 (two) times daily. , Disp: , Rfl:  .  clotrimazole-betamethasone (LOTRISONE) cream, Apply 1 application topically 2 (two) times daily as needed., Disp: 45 g, Rfl: 1 .   docusate sodium (STOOL SOFTENER) 100 MG capsule, Take 100 mg by mouth daily as needed for mild constipation., Disp: , Rfl:  .  doxycycline (DORYX) 100 MG EC tablet, Take 100 mg by mouth 2 (two) times daily., Disp: , Rfl:  .  doxycycline (VIBRA-TABS) 100 MG tablet, Take 100 mg by mouth 2 (two) times daily., Disp: , Rfl:  .  Eflornithine HCl (VANIQA) 13.9 % cream, Apply 1 Dose topically daily as needed., Disp: 45 g, Rfl: 3 .  ezetimibe (ZETIA) 10 MG tablet, Take 10 mg by mouth daily., Disp: , Rfl:  .  ferrous sulfate 325 (65 FE) MG EC tablet, Take 1 tablet (325 mg total) by mouth 2 (two) times daily with a meal. (Patient taking differently: Take by mouth daily with breakfast. ), Disp: 180 tablet, Rfl: 1 .  Glucosamine 750 MG TABS, Take by mouth daily., Disp: , Rfl:  .  meloxicam (MOBIC) 15 MG tablet, Take 15 mg by mouth daily., Disp: , Rfl:  .  metroNIDAZOLE (METROGEL) 0.75 % gel, APPLY TO AFFECTED AREA ON FACE BID, Disp: , Rfl: 3 .  mirabegron ER (MYRBETRIQ) 25 MG TB24 tablet, Take 1 tablet (25 mg total) by mouth daily., Disp: 30 tablet, Rfl: 6 .  Multiple Vitamins-Minerals (MULTIVITAMIN WITH MINERALS) tablet, Take 1 tablet by  mouth daily., Disp: , Rfl:  .  OMEGA-3 FATTY ACIDS PO, Take by mouth daily. , Disp: , Rfl:  .  omeprazole (PRILOSEC) 20 MG capsule, TAKE 1 CAPSULE BY MOUTH  DAILY, Disp: 90 capsule, Rfl: 3 .  ORACEA 40 MG capsule, PRN, Disp: , Rfl:  .  psyllium (REGULOID) 0.52 G capsule, Take 0.52 g by mouth daily., Disp: , Rfl:  .  SOOLANTRA 1 % CREA, , Disp: , Rfl:  .  spironolactone (ALDACTONE) 50 MG tablet, 50 mg daily. , Disp: , Rfl:  .  traMADol (ULTRAM) 50 MG tablet, Take 1 tablet (50 mg total) by mouth every 6 (six) hours as needed., Disp: 100 tablet, Rfl: 2 .  TURMERIC PO, Take by mouth daily., Disp: , Rfl:  .  WELCHOL 625 MG tablet, Take 1 tablet by mouth 3 (three) times daily. , Disp: , Rfl:   Social History   Tobacco Use  Smoking Status Never Smoker  Smokeless Tobacco Never  Used    No Known Allergies Objective:   Vitals:   12/12/19 1334  Temp: (!) 97.3 F (36.3 C)   There is no height or weight on file to calculate BMI. Constitutional Well developed. Well nourished.  Vascular Dorsalis pedis pulses palpable bilaterally. Posterior tibial pulses palpable bilaterally. Capillary refill normal to all digits.  No cyanosis or clubbing noted. Pedal hair growth normal.  Neurologic Normal speech. Oriented to person, place, and time. Epicritic sensation to light touch grossly present bilaterally.  Dermatologic Painful ingrowing nail at medial nail borders of the hallux nail left. No open wounds. No skin lesions.  Orthopedic: Normal joint ROM without pain or crepitus bilaterally. No visible deformities. No pain on palpation to the left peroneal tendon insertion at the fifth metatarsal base.  No pain on resisted eversion and pain on inversion of the foot. No tender to palpation at the calcaneal tuber left. No pain with calcaneal squeeze left. Mild pain along the posterior tibial tendon along the course of the tendon as well as its insertion.  Pain with inversion and eversion of the posterior tibial tendon. Ankle ROM full range of motion left. Silfverskiold Test: negative left.   Radiographs: None  Assessment:   No diagnosis found. Plan:  Patient was evaluated and treated and all questions answered.  Plantar Fasciitis, left -Resolved  Left peroneal tendinitis -Resolved  Left posterior tibial tendinitis -I explained to the patient the etiology of posterior tibial tendinitis and various treatment options associated with it.  I believe that given her pain is very mild in nature likely due to her collapse of the arch I believe she will benefit from custom-made orthotics to help support the arches of her foot and therefore take the stress off the peroneal and posterior tibial tendon.  This will also indirectly help with the plantar fasciitis as  well -Patient is wearing her orthotics which appear to be helping her a lot.  However she still has some pain from acute inflammation at the insertion of the posterior tibial tendon.  I believe patient will benefit from steroid injection to help decrease the inflammatory component of the pain.  Patient agrees with the plan would like to proceed with injection despite the risks associated with it.  I explained to the patient that there is a high risk of tendon rupture given the location. -A steroid injection was performed at left medial foot at the point of maximal tenderness using 1% plain Lidocaine and 10 mg of Kenalog. This was well tolerated.  Ingrown Nail, left -Patient elects to proceed with minor surgery to remove ingrown toenail removal today. Consent reviewed and signed by patient. -Ingrown nail excised. See procedure note. -Educated on post-procedure care including soaking. Written instructions provided and reviewed. -Patient to follow up in 2 weeks for nail check.  Procedure: Excision of Ingrown Toenail Location: Left 1st toe medial nail borders. Anesthesia: Lidocaine 1% plain; 1.5 mL and Marcaine 0.5% plain; 1.5 mL, digital block. Skin Prep: Betadine. Dressing: Silvadene; telfa; dry, sterile, compression dressing. Technique: Following skin prep, the toe was exsanguinated and a tourniquet was secured at the base of the toe. The affected nail border was freed, split with a nail splitter, and excised. Chemical matrixectomy was then performed with phenol and irrigated out with alcohol. The tourniquet was then removed and sterile dressing applied. Disposition: Patient tolerated procedure well. Patient to return in 2 weeks for follow-up.   No follow-ups on file.  No follow-ups on file.

## 2020-03-18 ENCOUNTER — Other Ambulatory Visit: Payer: Self-pay | Admitting: Family Medicine

## 2020-03-18 DIAGNOSIS — K30 Functional dyspepsia: Secondary | ICD-10-CM

## 2020-03-24 ENCOUNTER — Ambulatory Visit (INDEPENDENT_AMBULATORY_CARE_PROVIDER_SITE_OTHER): Payer: Medicare PPO | Admitting: Obstetrics & Gynecology

## 2020-03-24 ENCOUNTER — Encounter: Payer: Self-pay | Admitting: Obstetrics & Gynecology

## 2020-03-24 ENCOUNTER — Other Ambulatory Visit: Payer: Self-pay

## 2020-03-24 VITALS — BP 160/90 | Ht 64.0 in | Wt 184.0 lb

## 2020-03-24 DIAGNOSIS — N8111 Cystocele, midline: Secondary | ICD-10-CM

## 2020-03-24 DIAGNOSIS — R3915 Urgency of urination: Secondary | ICD-10-CM

## 2020-03-24 DIAGNOSIS — N993 Prolapse of vaginal vault after hysterectomy: Secondary | ICD-10-CM

## 2020-03-24 DIAGNOSIS — Z1231 Encounter for screening mammogram for malignant neoplasm of breast: Secondary | ICD-10-CM

## 2020-03-24 MED ORDER — TOLTERODINE TARTRATE ER 2 MG PO CP24: 2.0000 mg | ORAL_CAPSULE | Freq: Every day | ORAL | 11 refills | Status: DC

## 2020-03-24 NOTE — Patient Instructions (Signed)
Thank you for choosing [facility]. As part of our ongoing efforts to improve patient experience, we would appreciate your feedback. Please fill out the short survey that you will receive by mail or MyChart. Your opinion is important to us! -Dr Abelardo Seidner  

## 2020-03-24 NOTE — Progress Notes (Signed)
HPI:      [name] is a 79 y.o. [id number] who presents today for her pessary follow up and examination related to her pelvic floor weakening.  Pt reports tolerating the pessary well with no vaginal bleeding and no vaginal discharge.  Symptoms of pelvic floor weakening have greatly improved. She is voiding and defecating without difficulty. She currently has a Cup #4 pessary.  She cleans regularly.  Left breast swelling noted recently in axilla.  No mass, No T.  PMHx: She  has a past medical history of Anemia, Anxiety, Arthritis, BRCA negative (08/04/2015), Breast cancer ([place]) (2002), Cataract, GERD (gastroesophageal reflux disease), Heart murmur (2001), Hiatal hernia, High cholesterol, Hypertension, Personal history of radiation therapy (2002), Rosacea, and 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 complications. Also,  has a past surgical history that includes Stab pheblectomy  (2010); vein closure procedure (Bilateral, 2008); Abdominal hysterectomy; Cholecystectomy; Breast surgery (Right, 2002); Eye surgery (Right, 2014); Colonoscopy (2013); post radiation therapy (Right); Colonoscopy with propofol (N/A, 12/08/2015); Esophagogastroduodenoscopy (egd) with propofol (N/A, 12/08/2015); Colonoscopy with propofol (N/A, 12/06/2017); Esophagogastroduodenoscopy (egd) with propofol (N/A, 12/06/2017); Colonoscopy with propofol (N/A, 12/22/2017); Esophagogastroduodenoscopy (egd) with propofol (N/A, 12/22/2017); Givens capsule study (N/A, 01/23/2018); Breast lumpectomy (Right, 2002); Breast excisional biopsy (Right, 2002); and Breast cyst aspiration (Left, 2003)., family history includes Breast cancer in her cousin; Breast cancer (age of onset: 79) in an other family member; Breast cancer (age of onset: 85) in her cousin; Breast cancer (age of onset: 45) in her maternal aunt; Cancer (age of onset: 79) in her cousin; Heart attack in her brother, father, mother, and sister; Other in her brother; Stroke in her sister.,   reports that she has never smoked. She has never used smokeless tobacco. She reports that she does not drink alcohol and does not use drugs.  She has a current medication list which includes the following prescription(s): atorvastatin, calcium citrate, clotrimazole-betamethasone, docusate sodium, doxycycline, doxycycline, vaniqa, ezetimibe, ferrous sulfate, glucosamine, meloxicam, metronidazole, mirabegron er, multivitamin with minerals, omega-3 fatty acids, omeprazole, oracea, psyllium, soolantra, spironolactone, tramadol, turmeric, and welchol. Also, has No Known Allergies.  Review of Systems  All other systems reviewed and are negative.   Objective: BP (!) 160/90   Ht '5\' 4"'$  (1.626 m)   Wt 184 lb (83.5 kg)   BMI 31.58 kg/m  Physical Exam Constitutional:      General: She is not in acute distress.    Appearance: She is well-developed.  Genitourinary:     Pelvic exam was performed with patient supine.     Vagina normal.     No vaginal erythema or bleeding.     Genitourinary Comments: Cuff intact/ no lesions  Absent uterus and cervix  HENT:     Head: Normocephalic and atraumatic.     Nose: Nose normal.  Abdominal:     General: There is no distension.     Palpations: Abdomen is soft.     Tenderness: There is no abdominal tenderness.  Musculoskeletal:        General: Normal range of motion.  Neurological:     Mental Status: She is alert and oriented to person, place, and time.     Cranial Nerves: No cranial nerve deficit.  Skin:    General: Skin is warm and dry.  Psychiatric:        Attention and Perception: Attention normal.        Mood and Affect: Mood normal.        Speech: Speech normal.  Behavior: Behavior normal.        Cognition and Memory: Cognition normal.        Judgment: Judgment normal.     Pessary Care Pessary removed and cleaned.  Vagina checked - without erosions - pessary replaced.  A/P:   ICD-10-CM   1. 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 Cystocele, midline  N81.11   3. Urinary urgency  R39.15   4. Encounter for screening mammogram for malignant neoplasm of breast  Z12.31 MM 3D SCREEN BREAST BILATERAL   Pessary was cleaned and replaced today. Instructions given for care. Concerning symptoms to observe for are counseled to patient. Follow up scheduled for 6 months.  MMG planned in Nov    No worrisome findings on exam today  Change OAB med to Detrol to see if better cost.  Myrbetriq helps but she reports 40/month.  Oxybutynin was 10/month but severe dry mouth.  A total of 20 minutes were spent face-to-face with the patient as well as preparation, review, communication, and documentation during this encounter.   Barnett Applebaum, MD, [facility], [facility] 03/24/2020  9:42 AM

## 2020-05-15 ENCOUNTER — Other Ambulatory Visit: Payer: Self-pay | Admitting: Family Medicine

## 2020-05-15 DIAGNOSIS — E7849 Other hyperlipidemia: Secondary | ICD-10-CM

## 2020-05-15 DIAGNOSIS — K30 Functional dyspepsia: Secondary | ICD-10-CM

## 2020-05-15 MED ORDER — EZETIMIBE 10 MG PO TABS: 10.0000 mg | ORAL_TABLET | Freq: Every day | ORAL | 1 refills | Status: DC

## 2020-05-15 NOTE — Telephone Encounter (Signed)
Pt is requesting a new Rx ezetimibe (ZETIA) 10 MG tablet.  Pharmacy:  [facility] [place], [facility] Phone:  [phone]  Fax:  [phone]

## 2020-05-15 NOTE — Telephone Encounter (Signed)
Refill sent to patients pharmacy. 

## 2020-05-15 NOTE — Telephone Encounter (Signed)
   Notes to clinic:  requesting 90 day supply   Requested Prescriptions  Pending Prescriptions Disp Refills   ezetimibe (ZETIA) 10 MG tablet [Pharmacy Med Name: EZETIMIBE 10MG  TABLETS] 90 tablet     Sig: TAKE 1 TABLET(10 MG) BY MOUTH DAILY      Cardiovascular:  Antilipid - Sterol Transport Inhibitors Failed - 05/15/2020 10:00 AM      Failed - LDL in normal range and within 360 days    LDL Chol Calc (NIH)  Date Value Ref Range Status  06/10/2019 59 0 - 99 mg/dL Final          Passed - Total Cholesterol in normal range and within 360 days    Cholesterol, Total  Date Value Ref Range Status  06/10/2019 121 100 - 199 mg/dL Final          Passed - HDL in normal range and within 360 days    HDL  Date Value Ref Range Status  06/10/2019 47 >39 mg/dL Final          Passed - Triglycerides in normal range and within 360 days    Triglycerides  Date Value Ref Range Status  06/10/2019 71 0 - 149 mg/dL Final          Passed - Valid encounter within last 12 months    Recent Outpatient Visits           11 months ago Encounter for annual physical exam   [facility] Jerrol Banana., MD   2 years ago Anemia, unspecified type   [facility] Jerrol Banana., MD   2 years ago Encounter for annual physical exam   [facility] Jerrol Banana., MD   2 years ago Urinary tract infection without hematuria, site unspecified   [facility] Jerrol Banana., MD   2 years ago Iron deficiency anemia, unspecified iron deficiency anemia type   [facility] Jerrol Banana., MD       Future Appointments             In 1 month Jerrol Banana., MD [facility], [place]

## 2020-05-20 ENCOUNTER — Other Ambulatory Visit: Payer: Self-pay | Admitting: Family Medicine

## 2020-05-20 MED ORDER — TRAMADOL HCL 50 MG PO TABS: 50.0000 mg | ORAL_TABLET | Freq: Four times a day (QID) | ORAL | 2 refills | Status: DC | PRN

## 2020-05-20 NOTE — Telephone Encounter (Signed)
[facility] faxed refill request for the following medications:  traMADol (ULTRAM) 50 MG tablet  Last Rx: 10/21/2019 LOV: 06/10/2019 NOV: 06/24/2020 Please advise. Thanks TNP

## 2020-06-01 ENCOUNTER — Other Ambulatory Visit: Payer: Self-pay

## 2020-06-02 ENCOUNTER — Telehealth: Payer: Self-pay | Admitting: *Deleted

## 2020-06-02 NOTE — Telephone Encounter (Signed)
Notified pharmacy that w have not seen patient in almost 2 years, He will let her know that she needs to establish care prior to refill

## 2020-06-02 NOTE — Telephone Encounter (Signed)
Refill requested for Ferrous Sulfate for this patient who has not been seen since 2019

## 2020-06-16 NOTE — Progress Notes (Signed)
Annual Wellness Visit     Patient: Claire Kane, Female    DOB: 05-17-1941, 78 y.o.   MRN: [id number] Visit Date: 06/17/2020  Today's Provider: Wilhemena Durie, MD   Chief Complaint  Patient presents with  . Annual Exam   Subjective    Claire Kane is a 79 y.o. female who presents today for her Annual Wellness Visit. Complete Physical. She reports consuming a general diet. Home exercise routine includes youtube exercise video and walking dog. She generally feels well. She reports sleeping fairly well. She does not have additional problems to discuss today.   HPI She is concerned about ongoing knee pain which is bone-on-bone. She also has chronic foot pain and would like to be referred to [facility] clinic podiatry.  Orthotics have helped by a Dr. Posey Pronto.  She is having some mild issues with rare but occasional fecal incontinence. Overall she feels well.  Social History   Tobacco Use  . Smoking status: Never Smoker  . Smokeless tobacco: Never Used  Vaping Use  . Vaping Use: Never used  Substance Use Topics  . Alcohol use: No  . Drug use: No       Medications: Outpatient Medications Prior to Visit  Medication Sig  . atorvastatin (LIPITOR) 10 MG tablet TAKE 1 TABLET EVERY DAY  . calcium citrate (CALCITRATE - DOSED IN MG ELEMENTAL CALCIUM) 950 MG tablet Take 200 mg of elemental calcium by mouth 2 (two) times daily.   . clotrimazole-betamethasone (LOTRISONE) cream Apply 1 application topically 2 (two) times daily as needed.  . docusate sodium (STOOL SOFTENER) 100 MG capsule Take 100 mg by mouth daily as needed for mild constipation.  . Eflornithine HCl (VANIQA) 13.9 % cream Apply 1 Dose topically daily as needed.  . ezetimibe (ZETIA) 10 MG tablet TAKE 1 TABLET(10 MG) BY MOUTH DAILY  . ferrous sulfate 325 (65 FE) MG EC tablet Take 1 tablet (325 mg total) by mouth 2 (two) times daily with a meal. (Patient taking differently: Take by mouth daily with breakfast. )  .  Glucosamine 750 MG TABS Take by mouth daily.  . metroNIDAZOLE (METROGEL) 0.75 % gel APPLY TO AFFECTED AREA ON FACE BID  . Multiple Vitamins-Minerals (MULTIVITAMIN WITH MINERALS) tablet Take 1 tablet by mouth daily.  Marland Kitchen omeprazole (PRILOSEC) 20 MG capsule TAKE 1 CAPSULE EVERY DAY  . ORACEA 40 MG capsule PRN  . psyllium (REGULOID) 0.52 G capsule Take 0.52 g by mouth daily.  . SOOLANTRA 1 % CREA   . spironolactone (ALDACTONE) 50 MG tablet 50 mg daily.   Marland Kitchen tolterodine (DETROL LA) 2 MG 24 hr capsule Take 1 capsule (2 mg total) by mouth daily.  . traMADol (ULTRAM) 50 MG tablet Take 1 tablet (50 mg total) by mouth every 6 (six) hours as needed.  Claire Kane 625 MG tablet Take 1 tablet by mouth 3 (three) times daily.   Marland Kitchen doxycycline (DORYX) 100 MG EC tablet Take 100 mg by mouth 2 (two) times daily. (Patient not taking: Reported on 06/17/2020)  . doxycycline (VIBRA-TABS) 100 MG tablet Take 100 mg by mouth 2 (two) times daily. (Patient not taking: Reported on 06/17/2020)  . meloxicam (MOBIC) 15 MG tablet Take 15 mg by mouth daily. (Patient not taking: Reported on 06/17/2020)  . OMEGA-3 FATTY ACIDS PO Take by mouth daily.  (Patient not taking: Reported on 06/17/2020)  . TURMERIC PO Take by mouth daily. (Patient not taking: Reported on 06/17/2020)   No facility-administered medications  prior to visit.    No Known Allergies  Patient Care Team: Jerrol Banana., MD as PCP - General (Family Medicine) Dingeldein, Remo Lipps, MD as Consulting Physician (Ophthalmology) Gae Dry, MD as Referring Physician (Obstetrics and Gynecology) Earlie Server, MD as Consulting Physician (Oncology) Thomes Dinning, MD as Referring Physician (Internal Medicine)  Review of Systems  Last lipids Lab Results  Component Value Date   CHOL 121 06/10/2019   HDL 47 06/10/2019   LDLCALC 59 06/10/2019   TRIG 71 06/10/2019   CHOLHDL 2.6 06/10/2019      Objective    Vitals: BP (!) 150/66 (BP Location: Left Arm, Patient  Position: Sitting, Cuff Size: Large)   Pulse 79   Temp 98.2 F (36.8 C) (Oral)   Ht 5\' 4"  (1.626 m)   Wt 189 lb 12.8 oz (86.1 kg)   BMI 32.58 kg/m  BP Readings from Last 3 Encounters:  06/17/20 (!) 150/66  03/24/20 (!) 160/90  09/24/19 130/80   Wt Readings from Last 3 Encounters:  06/17/20 189 lb 12.8 oz (86.1 kg)  03/24/20 184 lb (83.5 kg)  09/24/19 186 lb (84.4 kg)      Physical Exam Vitals reviewed.  Constitutional:      Appearance: She is well-developed.  HENT:     Head: Normocephalic and atraumatic.     Right Ear: External ear normal.     Left Ear: External ear normal.     Nose: Nose normal.  Eyes:     General: No scleral icterus.    Conjunctiva/sclera: Conjunctivae normal.  Neck:     Thyroid: No thyromegaly.  Cardiovascular:     Rate and Rhythm: Normal rate and regular rhythm.     Heart sounds: Normal heart sounds.  Pulmonary:     Effort: Pulmonary effort is normal.     Breath sounds: Normal breath sounds.  Chest:     Breasts:        Right: Normal.        Left: Normal.  Abdominal:     Palpations: Abdomen is soft.  Lymphadenopathy:     Cervical: No cervical adenopathy.  Skin:    General: Skin is warm and dry.     Comments: Very fair skin  Neurological:     General: No focal deficit present.     Mental Status: She is alert and oriented to person, place, and time.  Psychiatric:        Mood and Affect: Mood normal.        Behavior: Behavior normal.        Thought Content: Thought content normal.        Judgment: Judgment normal.      Most recent functional status assessment: No flowsheet data found. Most recent fall risk assessment: Fall Risk  06/11/2019  Falls in the past year? 1  Number falls in past yr: 0  Injury with Fall? 0  Comment -  Follow up Falls prevention discussed    Most recent depression screenings: PHQ 2/9 Scores 06/11/2019 06/10/2019  PHQ - 2 Score 0 0  PHQ- 9 Score - 1   Most recent cognitive screening: 6CIT Screen  06/11/2019  What Year? 0 points  What month? 0 points  What time? 0 points  Count back from 20 0 points  Months in reverse 0 points  Repeat phrase 0 points  Total Score 0   Most recent Audit-C alcohol use screening Alcohol Use Disorder Test (AUDIT) 06/10/2019  1. How often do  you have a drink containing alcohol? 0   A score of 3 or more in women, and 4 or more in men indicates increased risk for alcohol abuse, EXCEPT if all of the points are from question 1   No results found for any visits on 06/17/20.  Assessment & Plan     Annual wellness visit done today including the all of the following: Reviewed patient's Family Medical History Reviewed and updated list of patient's medical providers Assessment of cognitive impairment was done Assessed patient's functional ability Established a written schedule for health screening [place] Completed and Reviewed  Exercise Activities and Dietary recommendations Goals    . DIET - INCREASE WATER INTAKE     Recommend increasing water intake to 4-6 glasses a day and decreasing soda and tea intake.     Marland Kitchen Exercise      Recommend increasing exercise by walking 4 days a week for 25 minutes.     Marland Kitchen LIFESTYLE - DECREASE FALLS RISK     Recommend to remove any items from the home that may cause slips or trips.       Immunization History  Administered Date(s) Administered  . Influenza, High Dose Seasonal PF 05/28/2015, 07/05/2017, 06/10/2019  . Influenza,inj,Quad PF,6+ Mos 06/27/2018  . Influenza-Unspecified 05/09/2016  . Pneumococcal Conjugate-13 04/22/2014  . Pneumococcal Polysaccharide-23 03/15/2011  . Td 09/06/2007, 12/30/2012  . Tdap 04/22/2014  . Zoster 06/27/2011    Health Maintenance  Topic Date Due  . Hepatitis C Screening  Never done  . COVID-19 Vaccine (1) Never done  . INFLUENZA VACCINE  04/19/2020  . TETANUS/TDAP  04/22/2024  . DEXA SCAN  Completed  . PNA vac Low Risk Adult  Completed      Discussed health benefits of physical activity, and encouraged her to engage in regular exercise appropriate for her age and condition.    1. Encounter for annual wellness visit (AWV) in Medicare patient   2. Annual physical exam GYN per Dr. Kenton Kingfisher  3. Other hyperlipidemia Follow lipids.  She is now off of WelChol - CBC with Differential/Platelet - Comprehensive metabolic panel - Iron - Lipid panel - Hemoglobin A1c  4. Iron deficiency anemia, unspecified iron deficiency anemia type  - CBC with Differential/Platelet - Comprehensive metabolic panel - Iron - Lipid panel - Hemoglobin A1c  5. Borderline diabetes mellitus Work on diet and exercise with small weight loss - CBC with Differential/Platelet - Comprehensive metabolic panel - Iron - Lipid panel - Hemoglobin A1c  6. Essential hypertension Blood pressure elevated today.  We will have her check home readings and follow-up in spring - CBC with Differential/Platelet - Comprehensive metabolic panel - Iron - Lipid panel - Hemoglobin A1c  7. Chronic knee pain, unspecified laterality Refer to [facility]. - Ambulatory referral to Orthopedic Surgery  8. Pain of foot, unspecified laterality Refer to [facility] podiatry - Ambulatory referral to Podiatry   No follow-ups on file.     I, Wilhemena Durie, MD, have reviewed all documentation for this visit. The documentation on 06/18/20 for the exam, diagnosis, procedures, and orders are all accurate and complete.    Richard Cranford Mon, MD  [facility] [phone] (phone) [phone] (fax)  [place]

## 2020-06-17 ENCOUNTER — Encounter: Payer: Self-pay | Admitting: Family Medicine

## 2020-06-17 ENCOUNTER — Other Ambulatory Visit: Payer: Self-pay

## 2020-06-17 ENCOUNTER — Ambulatory Visit (INDEPENDENT_AMBULATORY_CARE_PROVIDER_SITE_OTHER): Payer: Medicare PPO | Admitting: Family Medicine

## 2020-06-17 VITALS — BP 150/66 | HR 79 | Temp 98.2°F | Ht 64.0 in | Wt 189.8 lb

## 2020-06-17 DIAGNOSIS — E7849 Other hyperlipidemia: Secondary | ICD-10-CM | POA: Diagnosis not present

## 2020-06-17 DIAGNOSIS — D509 Iron deficiency anemia, unspecified: Secondary | ICD-10-CM | POA: Diagnosis not present

## 2020-06-17 DIAGNOSIS — I10 Essential (primary) hypertension: Secondary | ICD-10-CM | POA: Diagnosis not present

## 2020-06-17 DIAGNOSIS — G8929 Other chronic pain: Secondary | ICD-10-CM

## 2020-06-17 DIAGNOSIS — Z0000 Encounter for general adult medical examination without abnormal findings: Secondary | ICD-10-CM | POA: Diagnosis not present

## 2020-06-17 DIAGNOSIS — M79673 Pain in unspecified foot: Secondary | ICD-10-CM

## 2020-06-17 DIAGNOSIS — M25569 Pain in unspecified knee: Secondary | ICD-10-CM

## 2020-06-17 DIAGNOSIS — R7303 Prediabetes: Secondary | ICD-10-CM | POA: Diagnosis not present

## 2020-06-17 NOTE — Patient Instructions (Signed)
TAKE METAMUCIL TWICE DAILY!!!!

## 2020-06-18 ENCOUNTER — Encounter: Payer: Self-pay | Admitting: Family Medicine

## 2020-06-18 DIAGNOSIS — I10 Essential (primary) hypertension: Secondary | ICD-10-CM | POA: Diagnosis not present

## 2020-06-18 DIAGNOSIS — D509 Iron deficiency anemia, unspecified: Secondary | ICD-10-CM | POA: Diagnosis not present

## 2020-06-18 DIAGNOSIS — R7303 Prediabetes: Secondary | ICD-10-CM | POA: Diagnosis not present

## 2020-06-18 DIAGNOSIS — E7849 Other hyperlipidemia: Secondary | ICD-10-CM | POA: Diagnosis not present

## 2020-06-19 LAB — CBC WITH DIFFERENTIAL/PLATELET
Basophils Absolute: 0.1 10*3/uL (ref 0.0–0.2)
Basos: 1 %
EOS (ABSOLUTE): 0.2 10*3/uL (ref 0.0–0.4)
Eos: 3 %
Hematocrit: 44 % (ref 34.0–46.6)
Hemoglobin: 14.7 g/dL (ref 11.1–15.9)
Immature Grans (Abs): 0 10*3/uL (ref 0.0–0.1)
Immature Granulocytes: 0 %
Lymphocytes Absolute: 1.9 10*3/uL (ref 0.7–3.1)
Lymphs: 24 %
MCH: 30.2 pg (ref 26.6–33.0)
MCHC: 33.4 g/dL (ref 31.5–35.7)
MCV: 91 fL (ref 79–97)
Monocytes Absolute: 0.7 10*3/uL (ref 0.1–0.9)
Monocytes: 8 %
Neutrophils Absolute: 5.1 10*3/uL (ref 1.4–7.0)
Neutrophils: 64 %
Platelets: 379 10*3/uL (ref 150–450)
RBC: 4.86 x10E6/uL (ref 3.77–5.28)
RDW: 12.7 % (ref 11.7–15.4)
WBC: 7.9 10*3/uL (ref 3.4–10.8)

## 2020-06-19 LAB — COMPREHENSIVE METABOLIC PANEL
ALT: 18 IU/L (ref 0–32)
AST: 17 IU/L (ref 0–40)
Albumin/Globulin Ratio: 1.9 (ref 1.2–2.2)
Albumin: 4.5 g/dL (ref 3.7–4.7)
Alkaline Phosphatase: 75 IU/L (ref 44–121)
BUN/Creatinine Ratio: 12 (ref 12–28)
BUN: 10 mg/dL (ref 8–27)
Bilirubin Total: 1.8 mg/dL — ABNORMAL HIGH (ref 0.0–1.2)
CO2: 24 mmol/L (ref 20–29)
Calcium: 8.9 mg/dL (ref 8.7–10.3)
Chloride: 105 mmol/L (ref 96–106)
Creatinine, Ser: 0.82 mg/dL (ref 0.57–1.00)
GFR calc Af Amer: 79 mL/min/{1.73_m2} (ref >59)
GFR calc non Af Amer: 68 mL/min/{1.73_m2} (ref >59)
Globulin, Total: 2.4 g/dL (ref 1.5–4.5)
Glucose: 105 mg/dL — ABNORMAL HIGH (ref 65–99)
Potassium: 4.6 mmol/L (ref 3.5–5.2)
Sodium: 146 mmol/L — ABNORMAL HIGH (ref 134–144)
Total Protein: 6.9 g/dL (ref 6.0–8.5)

## 2020-06-19 LAB — HEMOGLOBIN A1C
Est. average glucose Bld gHb Est-mCnc: 123 mg/dL
Hgb A1c MFr Bld: 5.9 % — ABNORMAL HIGH (ref 4.8–5.6)

## 2020-06-19 LAB — LIPID PANEL
Chol/HDL Ratio: 3.2 ratio (ref 0.0–4.4)
Cholesterol, Total: 145 mg/dL (ref 100–199)
HDL: 45 mg/dL (ref >39)
LDL Chol Calc (NIH): 81 mg/dL (ref 0–99)
Triglycerides: 102 mg/dL (ref 0–149)
VLDL Cholesterol Cal: 19 mg/dL (ref 5–40)

## 2020-06-19 LAB — IRON: Iron: 184 ug/dL — ABNORMAL HIGH (ref 27–139)

## 2020-06-24 ENCOUNTER — Encounter: Payer: Medicare PPO | Admitting: Family Medicine

## 2020-07-03 DIAGNOSIS — L718 Other rosacea: Secondary | ICD-10-CM | POA: Diagnosis not present

## 2020-07-03 DIAGNOSIS — L821 Other seborrheic keratosis: Secondary | ICD-10-CM | POA: Diagnosis not present

## 2020-07-03 DIAGNOSIS — L720 Epidermal cyst: Secondary | ICD-10-CM | POA: Diagnosis not present

## 2020-07-03 DIAGNOSIS — L680 Hirsutism: Secondary | ICD-10-CM | POA: Diagnosis not present

## 2020-07-22 ENCOUNTER — Other Ambulatory Visit: Payer: Self-pay | Admitting: Family Medicine

## 2020-07-22 DIAGNOSIS — K30 Functional dyspepsia: Secondary | ICD-10-CM

## 2020-07-22 NOTE — Telephone Encounter (Signed)
Requested Prescriptions  Pending Prescriptions Disp Refills  . omeprazole (PRILOSEC) 20 MG capsule [Pharmacy Med Name: OMEPRAZOLE 20 MG Capsule Delayed Release] 90 capsule 3    Sig: Take 1 capsule (20 mg total) by mouth daily.     Gastroenterology: Proton Pump Inhibitors Failed - 07/22/2020  2:54 PM      Failed - Valid encounter within last 12 months    Recent Outpatient Visits          1 month ago Encounter for annual wellness visit (AWV) in Medicare patient   [facility] Jerrol Banana., MD   1 year ago Encounter for annual physical exam   [facility] Jerrol Banana., MD   2 years ago Anemia, unspecified type   [facility] Jerrol Banana., MD   2 years ago Encounter for annual physical exam   [facility] Jerrol Banana., MD   2 years ago Urinary tract infection without hematuria, site unspecified   [facility] Jerrol Banana., MD      Future Appointments            In 4 months Jerrol Banana., MD [facility]           . atorvastatin (LIPITOR) 10 MG tablet [Pharmacy Med Name: ATORVASTATIN CALCIUM 10 MG Tablet] 90 tablet 3    Sig: Take 1 tablet (10 mg total) by mouth daily.     Cardiovascular:  Antilipid - Statins Failed - 07/22/2020  2:54 PM      Failed - LDL in normal range and within 360 days    LDL Chol Calc (NIH)  Date Value Ref Range Status  06/18/2020 81 0 - 99 mg/dL Final         Failed - Valid encounter within last 12 months    Recent Outpatient Visits          1 month ago Encounter for annual wellness visit (AWV) in Medicare patient   [facility] Jerrol Banana., MD   1 year ago Encounter for annual physical exam   [facility] Jerrol Banana., MD   2 years ago Anemia, unspecified type   [facility] Jerrol Banana., MD   2 years ago Encounter for annual  physical exam   [facility] Jerrol Banana., MD   2 years ago Urinary tract infection without hematuria, site unspecified   [facility] Jerrol Banana., MD      Future Appointments            In 4 months Jerrol Banana., MD [facility]           Passed - Total Cholesterol in normal range and within 360 days    Cholesterol, Total  Date Value Ref Range Status  06/18/2020 145 100 - 199 mg/dL Final         Passed - HDL in normal range and within 360 days    HDL  Date Value Ref Range Status  06/18/2020 45 >39 mg/dL Final         Passed - Triglycerides in normal range and within 360 days    Triglycerides  Date Value Ref Range Status  06/18/2020 102 0 - 149 mg/dL Final         Passed - Patient is not pregnant       Pt. Completed  annual Medicare wellness check with PCP 05/2020.  Medications refilled per protocol.

## 2020-07-23 ENCOUNTER — Other Ambulatory Visit: Payer: Self-pay

## 2020-07-23 ENCOUNTER — Ambulatory Visit (INDEPENDENT_AMBULATORY_CARE_PROVIDER_SITE_OTHER): Payer: Medicare PPO

## 2020-07-23 DIAGNOSIS — Z23 Encounter for immunization: Secondary | ICD-10-CM | POA: Diagnosis not present

## 2020-08-10 ENCOUNTER — Other Ambulatory Visit: Payer: Self-pay | Admitting: Family Medicine

## 2020-08-10 DIAGNOSIS — E7849 Other hyperlipidemia: Secondary | ICD-10-CM

## 2020-08-10 MED ORDER — EZETIMIBE 10 MG PO TABS: ORAL_TABLET | ORAL | 1 refills | Status: DC

## 2020-08-10 NOTE — Telephone Encounter (Signed)
Rx sent 

## 2020-08-10 NOTE — Telephone Encounter (Signed)
[employer] faxed refill request for the following medications:  ezetimibe    Please advise.  Thanks, [employer]

## 2020-08-18 ENCOUNTER — Other Ambulatory Visit: Payer: Self-pay

## 2020-08-18 ENCOUNTER — Ambulatory Visit
Admission: RE | Admit: 2020-08-18 | Discharge: 2020-08-18 | Disposition: A | Discharge status: Home / Self Care | Payer: Medicare PPO | Source: Ambulatory Visit | Attending: Obstetrics & Gynecology | Admitting: Obstetrics & Gynecology

## 2020-08-18 DIAGNOSIS — Z1231 Encounter for screening mammogram for malignant neoplasm of breast: Secondary | ICD-10-CM | POA: Diagnosis not present

## 2020-08-19 ENCOUNTER — Other Ambulatory Visit: Payer: Self-pay | Admitting: Family Medicine

## 2020-08-19 ENCOUNTER — Other Ambulatory Visit: Payer: Self-pay | Admitting: Obstetrics & Gynecology

## 2020-08-19 DIAGNOSIS — R921 Mammographic calcification found on diagnostic imaging of breast: Secondary | ICD-10-CM

## 2020-08-19 DIAGNOSIS — E7849 Other hyperlipidemia: Secondary | ICD-10-CM

## 2020-08-19 DIAGNOSIS — R928 Other abnormal and inconclusive findings on diagnostic imaging of breast: Secondary | ICD-10-CM

## 2020-08-19 NOTE — Telephone Encounter (Signed)
Future visit in 3 months  

## 2020-09-07 ENCOUNTER — Ambulatory Visit
Admission: RE | Admit: 2020-09-07 | Discharge: 2020-09-07 | Disposition: A | Discharge status: Home / Self Care | Payer: Medicare PPO | Source: Ambulatory Visit | Attending: Obstetrics & Gynecology | Admitting: Obstetrics & Gynecology

## 2020-09-07 ENCOUNTER — Other Ambulatory Visit: Payer: Self-pay

## 2020-09-07 DIAGNOSIS — R921 Mammographic calcification found on diagnostic imaging of breast: Secondary | ICD-10-CM

## 2020-09-07 DIAGNOSIS — R928 Other abnormal and inconclusive findings on diagnostic imaging of breast: Secondary | ICD-10-CM | POA: Insufficient documentation

## 2020-09-08 ENCOUNTER — Other Ambulatory Visit: Payer: Self-pay | Admitting: Obstetrics & Gynecology

## 2020-09-08 DIAGNOSIS — R928 Other abnormal and inconclusive findings on diagnostic imaging of breast: Secondary | ICD-10-CM

## 2020-09-08 DIAGNOSIS — R921 Mammographic calcification found on diagnostic imaging of breast: Secondary | ICD-10-CM

## 2020-09-09 ENCOUNTER — Ambulatory Visit: Payer: Medicare PPO

## 2020-09-15 ENCOUNTER — Ambulatory Visit
Admission: RE | Admit: 2020-09-15 | Discharge: 2020-09-15 | Disposition: A | Discharge status: Home / Self Care | Payer: Medicare PPO | Source: Ambulatory Visit | Attending: Obstetrics & Gynecology | Admitting: Obstetrics & Gynecology

## 2020-09-15 ENCOUNTER — Other Ambulatory Visit: Payer: Self-pay

## 2020-09-15 DIAGNOSIS — D0512 Intraductal carcinoma in situ of left breast: Secondary | ICD-10-CM | POA: Diagnosis not present

## 2020-09-15 DIAGNOSIS — R928 Other abnormal and inconclusive findings on diagnostic imaging of breast: Secondary | ICD-10-CM

## 2020-09-15 DIAGNOSIS — R921 Mammographic calcification found on diagnostic imaging of breast: Secondary | ICD-10-CM | POA: Diagnosis not present

## 2020-09-15 HISTORY — PX: BREAST BIOPSY: SHX20

## 2020-09-16 ENCOUNTER — Encounter: Payer: Self-pay | Admitting: *Deleted

## 2020-09-16 LAB — SURGICAL PATHOLOGY

## 2020-09-16 NOTE — Progress Notes (Addendum)
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  Called patient.  She has a history of right breast DCIS in 2002.  She was seen by Dr. Evette Kane and Dr. Doylene Kane at that time.  States she has since seen Dr. Lemar Kane and is currently being follow by Dr. Cathie Kane for anemia.  I have scheduled patient to see Dr. Cathie Kane on 09/24/20 @ 11:45.  I am waiting on an appointment with Dr. Lemar Kane.  His office is to call me back with that appointment.  Will give educational material at her consult with Dr. Cathie Kane on the 6th.    Patient is scheduled to see Dr. Lemar Kane on 09/21/20 @ 1:30.

## 2020-09-19 DIAGNOSIS — C50912 Malignant neoplasm of unspecified site of left female breast: Secondary | ICD-10-CM

## 2020-09-19 DIAGNOSIS — C50919 Malignant neoplasm of unspecified site of unspecified female breast: Secondary | ICD-10-CM | POA: Insufficient documentation

## 2020-09-19 HISTORY — DX: Malignant neoplasm of unspecified site of unspecified female breast: C50.919

## 2020-09-19 HISTORY — DX: Malignant neoplasm of unspecified site of left female breast: C50.912

## 2020-09-21 ENCOUNTER — Other Ambulatory Visit: Payer: Self-pay | Admitting: General Surgery

## 2020-09-21 DIAGNOSIS — D0512 Intraductal carcinoma in situ of left breast: Secondary | ICD-10-CM | POA: Diagnosis not present

## 2020-09-21 NOTE — Progress Notes (Signed)
Subjective:     Patient ID: Claire Kane is a 80 y.o. female.  HPI  The following portions of the patient's history were reviewed and updated as appropriate.  This an established patient is here today for: office visit. The patient was recently diagnosed with left breast cancer. Patient was referred today by Dr. Kenton Kingfisher. She was a patient of Dr. Synthia Innocent. Sankar's and was treated for right breast cancer followed by radiation in 2002. Patient last saw Dr. Jamal Collin in 2018 before his retirement and Dr. Bary Castilla in November of 2019. Prior to recent mammogram she noticed no breast changes. She denies any trauma to the left breast.   The patient reports she has 3 maternal cousins, one maternal aunt, and one maternal niece with breast cancer.   The patient has been followed by Dr. Tasia Catchings from hematology for iron deficiency anemia, most recently in July 2019.  She reports that she had undergone iron infusions in the past.  Microcytic anemia in 2016-2017 with MCV as low as 74.  Hemoglobin as low as 8.0.      Chief Complaint  Patient presents with  . Breast Cancer    discussion     BP 162/88   Pulse 84   Temp 36.8 C (98.2 F)   Ht 162.6 cm (5' 4" )   Wt 83.9 kg (185 lb)   SpO2 97%   BMI 31.76 kg/m       Past Medical History:  Diagnosis Date  . Anemia   . Anxiety   . Arthritis   . BRCA negative 08/04/2015  . Cardiac murmur 2001  . Cataract   . GERD (gastroesophageal reflux disease)   . Hiatal hernia   . High cholesterol   . Hypertension   . Malignant neoplasm of left female breast (CMS-HCC) 09/15/2020  . Malignant neoplasm of right female breast (CMS-HCC) 10/03/2000   DCIS, intermediate grade, 3 mm, wide excision, whole breast radiation, tamoxifen  . Personal history of radiation therapy 2002  . Rosacea   . Varicose veins of lower extremity           Past Surgical History:  Procedure Laterality Date  . ABDOMINAL HYSTERECTOMY    . ASPIRATION CYST BREAST Left 2003   . BREAST EXCISIONAL BIOPSY Left 09/15/2020  . BREAST EXCISIONAL BIOPSY Right 2002  . CHOLECYSTECTOMY    . COLONOSCOPY  12/22/2017  . COLONOSCOPY  12/06/2017  . COLONOSCOPY  12/08/2015  . COLONOSCOPY  2013  . EGD  12/22/2017  . EGD  12/06/2017  . EGD  12/08/2015  . givens capsule study  01/23/2018  . MASTECTOMY PARTIAL / LUMPECTOMY Right 2002  . right eye surgery  2014  . STAB PHLEBECTOMY VARICOSE VEINS ONE EXTREMITY  2010  . vein closure procedure Bilateral 2008              OB History    Gravida  2   Para  2   Term      Preterm      AB      Living        SAB      IAB      Ectopic      Molar      Multiple      Live Births          Obstetric Comments  Age at first period 60 Age of first pregnancy 54        Social History  Socioeconomic History  . Marital status: Married    Spouse name: Not on file  . Number of children: Not on file  . Years of education: Not on file  . Highest education level: Not on file  Occupational History  . Not on file  Tobacco Use  . Smoking status: Never Smoker  . Smokeless tobacco: Never Used  Substance and Sexual Activity  . Alcohol use: Not Currently  . Drug use: Never  . Sexual activity: Not on file  Other Topics Concern  . Not on file  Social History Narrative  . Not on file   Social Determinants of Health   Financial Resource Strain: Not on file  Food Insecurity: Not on file  Transportation Needs: Not on file       No Known Allergies  Current Medications        Current Outpatient Medications  Medication Sig Dispense Refill  . *calcium polycarbophil oral 2 tab by mouth daily    . *glucosamine hcl/chondroitin sulfate a oral 1 tab by mouth daily    . atorvastatin (LIPITOR) 10 MG tablet TAKE 1 TABLET BY MOUTH ONCE DAILY 90 tablet 3  . doxycycline (ORACEA) 40 mg capsule 1 cap by mouth daily as needed    . ezetimibe (ZETIA) 10 mg tablet Take 1 tablet (10  mg total) by mouth once daily 90 tablet 3  . ferrous sulfate 325 (65 FE) MG tablet Take 325 mg by mouth 2 (two) times daily with meals  1  . multivitamin-minerals-lutein (CENTRUM SILVER) Tab 1 tab by mouth daily    . omeprazole (PRILOSEC) 20 MG DR capsule   0  . psyllium (METAMUCIL) 0.52 gram capsule Take 0.52 g by mouth once daily. Take 2 capsules once a day    . spironolactone (ALDACTONE) 50 MG tablet   0  . tolterodine (DETROL LA) 2 MG LA capsule Take by mouth    . VANIQA 13.9 % cream APPLY TO AFFECTED AREAS PRN  11  . aspirin 81 mg tablet 1 tab by mouth daily (Patient not taking: Reported on 07/05/2018 )    . metroNIDAZOLE (METROGEL) 0.75 % gel APPLY TO AFFECTED AREA ON FACE BID (Patient not taking: Reported on 09/21/2020)  3  . omega-3 fatty acids/fish oil 340-1,000 mg capsule Take 1 capsule by mouth once daily. (Patient not taking: Reported on 09/21/2020  )    . ranitidine (ZANTAC) 150 MG tablet  (Patient not taking: Reported on 09/21/2020  )  1  . SOOLANTRA 1 % Crea Reported on 12/17/2015  (Patient not taking: Reported on 09/21/2020  )  0  . WELCHOL 625 mg tablet take 6 tablets by mouth once daily (Patient not taking: Reported on 09/21/2020) 540 tablet 3   No current facility-administered medications for this visit.           Family History  Problem Relation Age of Onset  . Myocardial Infarction (Heart attack) Mother   . Myocardial Infarction (Heart attack) Father   . Stroke Sister   . Myocardial Infarction (Heart attack) Sister   . Myocardial Infarction (Heart attack) Brother   . Breast cancer Cousin        maternal x 3  . Breast cancer Maternal Aunt   . Breast cancer Niece        maternal  . Colon cancer Neg Hx       Review of Systems  Constitutional: Negative for chills and fever.  Respiratory: Negative for cough.  Objective:   Physical Exam Exam conducted with a chaperone present.  Constitutional:      Appearance: Normal  appearance.  Cardiovascular:     Rate and Rhythm: Normal rate and regular rhythm.     Pulses: Normal pulses.     Heart sounds: Murmur heard.    Pulmonary:     Effort: Pulmonary effort is normal.     Breath sounds: Normal breath sounds.  Chest:  Breasts:     Right: No axillary adenopathy or supraclavicular adenopathy.     Left: No axillary adenopathy or supraclavicular adenopathy.        Comments: Wide excision site in the upper outer quadrant of the right breast shows mild thickening, unchanged from prior exams.  No skin changes from RT.  No significant bruising at the site of her recent biopsy in the upper outer quadrant of the left breast, 1:00, about 5 cm from the nipple.  Mild volume loss on the right. Musculoskeletal:     Cervical back: Neck supple.  Lymphadenopathy:     Upper Body:     Right upper body: No supraclavicular or axillary adenopathy.     Left upper body: No supraclavicular or axillary adenopathy.  Skin:    General: Skin is warm and dry.  Neurological:     Mental Status: She is alert and oriented to person, place, and time.  Psychiatric:        Mood and Affect: Mood normal.        Behavior: Behavior normal.    Labs and Radiology:   September 15, 2020 left breast stereotactic biopsy: DIAGNOSIS:  A. BREAST, LEFT, UPPER OUTER QUADRANT; STEREOTACTIC-GUIDED CORE BIOPSY:  - DUCTAL CARCINOMA IN SITU (DCIS), HIGH NUCLEAR GRADE WITH FOCAL  NECROSIS, ASSOCIATED WITH CALCIFICATIONS.   Comment:  High-grade DCIS is present in the first 6 blocks (all of the blocks with  radiographic calcifications), spanning at least 5 mm measured on the  slide. Estrogen receptor testing is deferred to the excision.    Mammogram review:  Studies from November 2019 through December 2021 were independently reviewed.  Mild loss of breast density over the last 2 years.  Significant increase in left upper outer quadrant calcifications since 2020.  Postbiopsy films showed a  significant amount of the prior calcifications have been removed.  Area appears to be about 2 cm in diameter.  The clip is located within the field of microcalcifications.  Ultrasound:  Ultrasound examination was undertaken to determine if preoperative wire localization would be required.  The biopsy cavity is presently 0.7 x 0.9 x 1.26 cm.  This is less than 1 cm from the skin.  The biopsy clip itself is not identified.  As this biopsy cavity collapses it will likely be difficult to identify by ultrasound and wire localization will be recommended.  WBC 3.4 - 10.8 x10E3/uL 7.9   RBC 3.77 - 5.28 x10E6/uL 4.86   Hemoglobin 11.1 - 15.9 g/dL 14.7   Hematocrit 34.0 - 46.6 % 44.0   MCV 79 - 97 fL 91   MCH 26.6 - 33.0 pg 30.2   MCHC 31.5 - 35.7 g/dL 33.4   RDW 11.7 - 15.4 % 12.7   Platelets 150 - 450 x10E3/uL 379   Neutrophils Not Estab. % 64   Lymphs Not Estab. % 24   Monocytes Not Estab. % 8   Eos Not Estab. % 3   Basos Not Estab. % 1   Neutrophils Absolute 1.4 - 7.0 x10E3/uL 5.1   Lymphocytes  Absolute 0.7 - 3.1 x10E3/uL 1.9   Monocytes Absolute 0.1 - 0.9 x10E3/uL 0.7   EOS (ABSOLUTE) 0.0 - 0.4 x10E3/uL 0.2   Basophils Absolute 0.0 - 0.2 x10E3/uL 0.1   Immature Granulocytes Not Estab. % 0   Immature Grans (Abs)     Glucose 65 - 99 mg/dL 105High   BUN 8 - 27 mg/dL 10   Creatinine, Ser 0.57 - 1.00 mg/dL 0.82   GFR calc non Af Amer >59 mL/min/1.73 68   GFR calc Af Amer >59 mL/min/1.73 79   Comment: **[facility] currently reports eGFR in compliance with the current**   recommendations of the [employer]. [facility] will   update reporting as new guidelines are published from the NKF-ASN   Task force.   BUN/Creatinine Ratio 12 - 28 12   Sodium 134 - 144 mmol/L 146High   Potassium 3.5 - 5.2 mmol/L 4.6   Chloride 96 - 106 mmol/L 105   CO2 20 - 29 mmol/L 24   Calcium 8.7 - 10.3 mg/dL 8.9   Total Protein 6.0 - 8.5 g/dL 6.9   Albumin 3.7 - 4.7 g/dL 4.5    Globulin, Total 1.5 - 4.5 g/dL 2.4   Albumin/Globulin Ratio 1.2 - 2.2 1.9   Bilirubin Total 0.0 - 1.2 mg/dL 1.8High   Alkaline Phosphatase 44 - 121 IU/L 75   Comment:       **Please note reference interval change**  AST 0 - 40 IU/L 17   ALT 0 - 32 IU/L 18    Elevated bilirubin is new from May 10, 2018.  Mild elevation to 1.3 in September 2020.  EGD and colonoscopy report from December 22, 2017 completed by Dr. Vicente Males reviewed.  No source of bleeding.    Assessment:     High-grade DCIS involving the left breast.  Mild change in serum bilirubin, possible relation to iron infusions.  LFTs otherwise normal.    Plan:     The patient is well familiar with breast conservation.  She had a wire localization for her original procedure in 2002.  Apparently, when she had completed about 4-1/2 years of tamoxifen therapy her serum cholesterol was a problem and this was put on hold for a period of time while she was being managed at [facility] by Dr. Arther Dames.  Once her lipid functions returned to normal she did complete a total of 5 years.  If she is a candidate for antiestrogen therapy, an aromatase inhibitor may be more appropriate.  Last bone density in the [facility] system is in 2013 and was normal.   I reviewed with her the radiologist recommendation for a preoperative MRI.  I really do not think this is going to add anything and do not feel this is mandatory.  She raised the question about whether she be encouraged to take an antiestrogen again, this will be known until final specimen processing.  She was not a candidate for accelerated partial breast radiation last time (MammoSite).  But she remembers that it was just coming out at that time and would be interested if a candidate for this procedure.  She has fairly narrow skin spacing and it might be necessary to do a mastoplasty to make it possible.  The patient is aware we will not know until after surgery and post procedure imaging whether  she be a candidate for MammoSite treatment.  We did discuss the role for radiation in people her age is changing, but likely with her excellent health she would be  a candidate for treatment.    Entered by Ledell Noss, CMA, acting as a scribe for Dr. Hervey Ard, MD.   The documentation recorded by the scribe accurately reflects the service I personally performed and the decisions made by me.   Robert Bellow, MD FACS

## 2020-09-22 ENCOUNTER — Other Ambulatory Visit: Payer: Self-pay | Admitting: General Surgery

## 2020-09-22 DIAGNOSIS — D0512 Intraductal carcinoma in situ of left breast: Secondary | ICD-10-CM

## 2020-09-24 ENCOUNTER — Inpatient Hospital Stay: Payer: Medicare PPO | Attending: Oncology | Admitting: Oncology

## 2020-09-24 ENCOUNTER — Encounter: Payer: Self-pay | Admitting: Oncology

## 2020-09-24 ENCOUNTER — Other Ambulatory Visit: Payer: Self-pay

## 2020-09-24 ENCOUNTER — Inpatient Hospital Stay: Payer: Medicare PPO

## 2020-09-24 ENCOUNTER — Ambulatory Visit: Payer: Medicare PPO | Admitting: Obstetrics & Gynecology

## 2020-09-24 ENCOUNTER — Encounter: Payer: Self-pay | Admitting: Obstetrics & Gynecology

## 2020-09-24 ENCOUNTER — Ambulatory Visit (INDEPENDENT_AMBULATORY_CARE_PROVIDER_SITE_OTHER): Payer: Medicare PPO | Admitting: Obstetrics & Gynecology

## 2020-09-24 VITALS — BP 130/84 | Ht 64.0 in | Wt 187.0 lb

## 2020-09-24 VITALS — BP 145/82 | HR 73 | Temp 98.6°F | Resp 18 | Wt 186.1 lb

## 2020-09-24 DIAGNOSIS — Z853 Personal history of malignant neoplasm of breast: Secondary | ICD-10-CM

## 2020-09-24 DIAGNOSIS — D509 Iron deficiency anemia, unspecified: Secondary | ICD-10-CM | POA: Insufficient documentation

## 2020-09-24 DIAGNOSIS — N993 Prolapse of vaginal vault after hysterectomy: Secondary | ICD-10-CM | POA: Diagnosis not present

## 2020-09-24 DIAGNOSIS — R3915 Urgency of urination: Secondary | ICD-10-CM | POA: Diagnosis not present

## 2020-09-24 DIAGNOSIS — Z809 Family history of malignant neoplasm, unspecified: Secondary | ICD-10-CM

## 2020-09-24 DIAGNOSIS — D0512 Intraductal carcinoma in situ of left breast: Secondary | ICD-10-CM | POA: Insufficient documentation

## 2020-09-24 DIAGNOSIS — N8111 Cystocele, midline: Secondary | ICD-10-CM | POA: Diagnosis not present

## 2020-09-24 LAB — HEPATIC FUNCTION PANEL
ALT: 15 U/L (ref 0–44)
AST: 17 U/L (ref 15–41)
Albumin: 4.2 g/dL (ref 3.5–5.0)
Alkaline Phosphatase: 56 U/L (ref 38–126)
Bilirubin, Direct: 0.2 mg/dL (ref 0.0–0.2)
Indirect Bilirubin: 0.9 mg/dL (ref 0.3–0.9)
Total Bilirubin: 1.1 mg/dL (ref 0.3–1.2)
Total Protein: 7 g/dL (ref 6.5–8.1)

## 2020-09-24 LAB — CBC WITH DIFFERENTIAL/PLATELET
Abs Immature Granulocytes: 0.04 10*3/uL (ref 0.00–0.07)
Basophils Absolute: 0.1 10*3/uL (ref 0.0–0.1)
Basophils Relative: 1 %
Eosinophils Absolute: 0.2 10*3/uL (ref 0.0–0.5)
Eosinophils Relative: 2 %
HCT: 39.1 % (ref 36.0–46.0)
Hemoglobin: 13.2 g/dL (ref 12.0–15.0)
Immature Granulocytes: 1 %
Lymphocytes Relative: 33 %
Lymphs Abs: 2.8 10*3/uL (ref 0.7–4.0)
MCH: 30.4 pg (ref 26.0–34.0)
MCHC: 33.8 g/dL (ref 30.0–36.0)
MCV: 90.1 fL (ref 80.0–100.0)
Monocytes Absolute: 0.7 10*3/uL (ref 0.1–1.0)
Monocytes Relative: 8 %
Neutro Abs: 4.7 10*3/uL (ref 1.7–7.7)
Neutrophils Relative %: 55 %
Platelets: 328 10*3/uL (ref 150–400)
RBC: 4.34 MIL/uL (ref 3.87–5.11)
RDW: 12.7 % (ref 11.5–15.5)
WBC: 8.4 10*3/uL (ref 4.0–10.5)
nRBC: 0 % (ref 0.0–0.2)

## 2020-09-24 LAB — IRON AND TIBC
Iron: 73 ug/dL (ref 28–170)
Saturation Ratios: 21 % (ref 10.4–31.8)
TIBC: 344 ug/dL (ref 250–450)
UIBC: 271 ug/dL

## 2020-09-24 LAB — RETIC PANEL
Immature Retic Fract: 5.4 % (ref 2.3–15.9)
RBC.: 4.35 MIL/uL (ref 3.87–5.11)
Retic Count, Absolute: 58.7 10*3/uL (ref 19.0–186.0)
Retic Ct Pct: 1.4 % (ref 0.4–3.1)
Reticulocyte Hemoglobin: 34 pg (ref >27.9)

## 2020-09-24 LAB — FERRITIN: Ferritin: 34 ng/mL (ref 11–307)

## 2020-09-24 MED ORDER — TOLTERODINE TARTRATE ER 2 MG PO CP24: 2.0000 mg | ORAL_CAPSULE | Freq: Every day | ORAL | 11 refills | Status: DC

## 2020-09-24 NOTE — Progress Notes (Signed)
Pt here for follow up. No new concerns voiced.   

## 2020-09-24 NOTE — Progress Notes (Signed)
HPI:      [name] is a 80 y.o. 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  Pt reports tolerating the pessary well with no vaginal bleeding and occas vaginal discharge, more so w activity.  She removes her cup type pessary very rarely fro a cleaning.   Symptoms of pelvic floor weakening have greatly improved. She is voiding and defecating without difficulty. She currently has a Cup #4 pessary.   OAB improved on Detrol, but she only takes when planning on going out.  Nocturia 2-3 times nightly.  PMHx: She  has a past medical history of Anemia, Anxiety, Arthritis, BRCA negative (08/04/2015), Breast cancer ([place]) (2002), Cataract, GERD (gastroesophageal reflux disease), Heart murmur (2001), Hiatal hernia, High cholesterol, Hypertension, Personal history of radiation therapy (2002), Rosacea, and 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 complications. Also,  has a past surgical history that includes Stab pheblectomy  (2010); vein closure procedure (Bilateral, 2008); Abdominal hysterectomy; Cholecystectomy; Breast surgery (Right, 2002); Eye surgery (Right, 2014); Colonoscopy (2013); post radiation therapy (Right); Colonoscopy with propofol (N/A, 12/08/2015); Esophagogastroduodenoscopy (egd) with propofol (N/A, 12/08/2015); Colonoscopy with propofol (N/A, 12/06/2017); Esophagogastroduodenoscopy (egd) with propofol (N/A, 12/06/2017); Colonoscopy with propofol (N/A, 12/22/2017); Esophagogastroduodenoscopy (egd) with propofol (N/A, 12/22/2017); Givens capsule study (N/A, 01/23/2018); Breast lumpectomy (Right, 2002); Breast cyst aspiration (Left, 2003); Breast excisional biopsy (Right, 2002); and Breast biopsy (Left, 09/15/2020)., family history includes Breast cancer in her cousin; Breast cancer (age of onset: 76) in an other family member; Breast cancer (age of onset: 72) in her cousin; Breast cancer (age of onset: 14) in her maternal aunt; Cancer  (age of onset: 7) in her cousin; Heart attack in her brother, father, mother, and sister; Other in her brother; Stroke in her sister.,  reports that she has never smoked. She has never used smokeless tobacco. She reports that she does not drink alcohol and does not use drugs.  She has a current medication list which includes the following prescription(s): atorvastatin, calcium citrate, docusate sodium, ferrous sulfate, glucosamine, multivitamin with minerals, omeprazole, psyllium, soolantra, spironolactone, and tramadol. Also, has No Known Allergies.  Review of Systems  Genitourinary: Positive for frequency and urgency.  All other systems reviewed and are negative.   Objective: BP 130/84   Ht 5' 4" (1.626 m)   Wt 187 lb (84.8 kg)   BMI 32.10 kg/m  Physical Exam Constitutional:      General: She is not in acute distress.    Appearance: She is well-developed.  Genitourinary:     Bladder and urethral meatus normal.     No lesions in the vagina.     Genitourinary Comments: Cuff intact/ no lesions  Absent uterus and cervix     Right Labia: No tenderness.    Left Labia: No tenderness.    Vaginal discharge present.     No vaginal erythema or bleeding.     Vaginal exam comments: Bleeding only to qtip touch on right side of vag wall.     Bladder exam comments: Cystoecle Gr2.     Pelvic exam was performed with patient in the lithotomy position.  HENT:     Head: Normocephalic and atraumatic.     Nose: Nose normal.  Abdominal:     General: There is no distension.     Palpations: Abdomen is soft.     Tenderness: There is no abdominal tenderness.  Musculoskeletal:        General: Normal  range of motion.  Neurological:     Mental Status: She is alert and oriented to person, place, and time.     Cranial Nerves: No cranial nerve deficit.  Skin:    General: Skin is warm and dry.  Psychiatric:        Attention and Perception: Attention normal.        Mood and Affect: Mood normal.         Speech: Speech normal.        Behavior: Behavior normal.        Cognition and Memory: Cognition normal.        Judgment: Judgment normal.     Pessary Care Pessary removed and cleaned.  Vagina checked - without erosions - pessary replaced.  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 Cystocele, midline  N81.11   3. Urinary urgency  R39.15    Pessary was cleaned and replaced today. Instructions given for care. Plan to clean and leave out overnight at least monthly. Consider change to smaller pessary cup nv.  Prior fittings led to several expulsion, so may not be possible to change size. Concerning symptoms to observe for are counseled to patient. Cont Detrol LA but change to daily for OAB Follow up scheduled for 6 months.  A total of 20 minutes were spent face-to-face with the patient as well as preparation, review, communication, and documentation during this encounter.   Barnett Applebaum, MD, [facility], [facility] 09/24/2020  1:50 PM

## 2020-09-24 NOTE — Progress Notes (Signed)
Hematology/Oncology follow up  note [facility] [phone] [phone]   Patient Care Team: Jerrol Banana., MD as PCP - General (Family Medicine) Dingeldein, Remo Lipps, MD as Consulting Physician (Ophthalmology) Gae Dry, MD as Referring Physician (Obstetrics and Gynecology) Earlie Server, MD as Consulting Physician (Oncology) Thomes Dinning, MD as Referring Physician (Internal Medicine)  REFERRING PROVIDER: Nilsa Nutting REASON FOR VISIT Evaluation of anemia  HISTORY OF PRESENTING ILLNESS:  Claire Kane is a  80 y.o.  female with PMH listed below who was referred to me for evaluation of anemia.  Patient reports she was told that she had iron deficiency 2 years ago and she took iron supplementation for a while and was told to stop. Recently found to to have worsening of anemia. She had Upper and lower endoscopy in 2017 by Dr.Sankar # Upper endoscopy 12/07/2017 Normal examined duodenum. - Large hiatal hernia. - Erythematous mucosa in the antrum. Biopsied. - The examination was otherwise normal # Colonoscopy on 12/07/2017  Non-thrombosed external hemorrhoids found on perianal exam. - Diverticulosis in the sigmoid colon and in the descending colon. - The examination was otherwise normal on direct and retroflexion views. - No specimens collected. #  capsule study which did not reveal any active bleeding source.  # Remote history of DCIS right breast in  2002, s/p surgery and RT. Finished 5 years of tamoxifen.   # April - May s/p upper and lower enoscropy, capsule study which did not reveal active bleeding source  Celiac panel positive.  # iron deficiency anemia.  Previously she has received IV Venofer. Patient has previously underwent extensive gastroenterology work-up including EGD, colonoscopy, capsule study which did not reveal active bleeding source.  Interval History 80 year old female with pertinent hematology history listed  above reviewed by me today presents to reestablish care for left breast DCIS.  Patient was last seen by me in July 2019.  She had iron deficiency anemia at that time was treated with IV Venofer treatments.  Then she lost follow-up. 08/18/2020, screening mammogram showed left breast calcification. 09/07/2020, diagnostic left mammogram showed an area of 1.4 cm group of calcifications in the upper outer left breast.  Patient underwent breast biopsy, pathology came back positive for high-grade DCIS, focal necrosis, associated with calcifications.  ER status deferred to existing specimen.  Patient has a remote history of right DCIS in 2022 status post lumpectomy, radiation, she finished total of 5 years of antiestrogen treatments.  Patient was seen by Dr. Bary Castilla on 10/18/2020 and plan surgery on 10/07/2020  Review of Systems  Constitutional: Positive for malaise/fatigue. Negative for chills, diaphoresis, fever and weight loss.  HENT: Negative for ear discharge, nosebleeds and sore throat.   Eyes: Negative for double vision, photophobia, discharge and redness.  Respiratory: Negative for cough, hemoptysis, sputum production, shortness of breath and wheezing.   Cardiovascular: Negative for chest pain, palpitations, orthopnea and claudication.  Gastrointestinal: Positive for heartburn. Negative for abdominal pain, blood in stool, constipation, nausea and vomiting.  Genitourinary: Negative for dysuria and urgency.  Musculoskeletal: Negative for back pain, myalgias and neck pain.  Skin: Negative for itching and rash.  Neurological: Negative for dizziness, tingling, tremors and sensory change.  Endo/Heme/Allergies: Negative for environmental allergies. Does not bruise/bleed easily.  Psychiatric/Behavioral: Negative for depression, substance abuse and suicidal ideas.    MEDICAL HISTORY:  Past Medical History:  Diagnosis Date  . Anemia   . Anxiety   . Arthritis   . 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 Breast cancer ([place]) 2002   DCIS right breast   . Cataract   . GERD (gastroesophageal reflux disease)   . Heart murmur 2001  . Hiatal hernia   . High cholesterol   . Hypertension   . Personal history of radiation therapy 2002   BREAST CA  . Rosacea   . 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 complications     SURGICAL HISTORY: Past Surgical History:  Procedure Laterality Date  . ABDOMINAL HYSTERECTOMY    . BREAST BIOPSY Left 09/15/2020   affirm bx, x marker, path pending  . BREAST CYST ASPIRATION Left 2003  . BREAST EXCISIONAL BIOPSY Right 2002   POS  . BREAST LUMPECTOMY Right 2002   w/ radiation  . BREAST SURGERY Right 2002   lumpectomy  . CHOLECYSTECTOMY    . COLONOSCOPY  2013  . COLONOSCOPY WITH PROPOFOL N/A 12/08/2015   Procedure: COLONOSCOPY WITH PROPOFOL;  Surgeon: Christene Lye, MD;  Location: [facility] ENDOSCOPY;  Service: Endoscopy;  Laterality: N/A;  . COLONOSCOPY WITH PROPOFOL N/A 12/06/2017   Procedure: COLONOSCOPY WITH PROPOFOL;  Surgeon: Jonathon Bellows, MD;  Location: [facility] ENDOSCOPY;  Service: Gastroenterology;  Laterality: N/A;  . COLONOSCOPY WITH PROPOFOL N/A 12/22/2017   Procedure: COLONOSCOPY WITH PROPOFOL;  Surgeon: Jonathon Bellows, MD;  Location: [facility] ENDOSCOPY;  Service: Gastroenterology;  Laterality: N/A;  . ESOPHAGOGASTRODUODENOSCOPY (EGD) WITH PROPOFOL N/A 12/08/2015   Procedure: ESOPHAGOGASTRODUODENOSCOPY (EGD) WITH PROPOFOL;  Surgeon: Christene Lye, MD;  Location: [facility] ENDOSCOPY;  Service: Endoscopy;  Laterality: N/A;  . ESOPHAGOGASTRODUODENOSCOPY (EGD) WITH PROPOFOL N/A 12/06/2017   Procedure: ESOPHAGOGASTRODUODENOSCOPY (EGD) WITH PROPOFOL;  Surgeon: Jonathon Bellows, MD;  Location: [facility] ENDOSCOPY;  Service: Gastroenterology;  Laterality: N/A;  . ESOPHAGOGASTRODUODENOSCOPY (EGD) WITH PROPOFOL N/A 12/22/2017   Procedure: ESOPHAGOGASTRODUODENOSCOPY (EGD) WITH PROPOFOL;  Surgeon: Jonathon Bellows, MD;  Location: [facility] ENDOSCOPY;  Service:  Gastroenterology;  Laterality: N/A;  . EYE SURGERY Right 2014  . GIVENS CAPSULE STUDY N/A 01/23/2018   Procedure: GIVENS CAPSULE STUDY;  Surgeon: Jonathon Bellows, MD;  Location: [facility] ENDOSCOPY;  Service: Gastroenterology;  Laterality: N/A;  . post radiation therapy Right    right breast cancer 2002  . Stab pheblectomy   2010  . vein closure procedure Bilateral 2008    SOCIAL HISTORY: Social History   Socioeconomic History  . Marital status: Married    Spouse name: Not on file  . Number of children: 2  . Years of education: Not on file  . Highest education level: Bachelor's degree (e.g., BA, AB, BS)  Occupational History  . Occupation: retired  Tobacco Use  . Smoking status: Never Smoker  . Smokeless tobacco: Never Used  Vaping Use  . Vaping Use: Never used  Substance and Sexual Activity  . Alcohol use: No  . Drug use: No  . Sexual activity: Never  Other Topics Concern  . Not on file  Social History Narrative  . Not on file   Social Determinants of Health   Financial Resource Strain: Not on file  Food Insecurity: Not on file  Transportation Needs: Not on file  Physical Activity: Not on file  Stress: Not on file  Social Connections: Not on file  Intimate Partner Violence: Not on file    FAMILY HISTORY: Family History  Problem Relation Age of Onset  . Heart attack Mother   . Heart attack Father   . Heart attack Sister   . Stroke Sister   . Heart attack Brother   .  Other Brother        cerebral hemorrhage, sepsis  . Breast cancer Cousin 50       breast/maternal  . Cancer Cousin 60       breast/maternal  . Breast cancer Cousin   . Breast cancer Maternal Aunt 70  . Breast cancer Other 48       maternal neice    ALLERGIES:  has No Known Allergies.  MEDICATIONS:  Current Outpatient Medications  Medication Sig Dispense Refill  . atorvastatin (LIPITOR) 10 MG tablet Take 1 tablet (10 mg total) by mouth daily. 90 tablet 3  . calcium citrate (CALCITRATE - DOSED IN  MG ELEMENTAL CALCIUM) 950 MG tablet Take 200 mg of elemental calcium by mouth 2 (two) times daily.     . docusate sodium (COLACE) 100 MG capsule Take 100 mg by mouth daily as needed for mild constipation.    . ferrous sulfate 325 (65 FE) MG EC tablet Take 1 tablet (325 mg total) by mouth 2 (two) times daily with a meal. 180 tablet 1  . Glucosamine 750 MG TABS Take 750 mg by mouth daily.    . Multiple Vitamins-Minerals (MULTIVITAMIN WITH MINERALS) tablet Take 1 tablet by mouth daily.    . omeprazole (PRILOSEC) 20 MG capsule Take 1 capsule (20 mg total) by mouth daily. 90 capsule 3  . psyllium (REGULOID) 0.52 G capsule Take 2 capsules by mouth daily.    . SOOLANTRA 1 % CREA Apply 1 application topically daily as needed (rosacea).    . spironolactone (ALDACTONE) 50 MG tablet Take 50 mg by mouth daily.    . traMADol (ULTRAM) 50 MG tablet Take 1 tablet (50 mg total) by mouth every 6 (six) hours as needed. (Patient taking differently: Take 50 mg by mouth every 6 (six) hours as needed for severe pain.) 100 tablet 2  . tolterodine (DETROL LA) 2 MG 24 hr capsule Take 1 capsule (2 mg total) by mouth daily. 30 capsule 11   No current facility-administered medications for this visit.     PHYSICAL EXAMINATION: ECOG PERFORMANCE STATUS: 0 - Asymptomatic Vitals:   09/24/20 1149  BP: (!) 145/82  Pulse: 73  Resp: 18  Temp: 98.6 F (37 C)   Filed Weights   09/24/20 1149  Weight: 186 lb 1.6 oz (84.4 kg)    Physical Exam Constitutional:      General: She is not in acute distress.    Appearance: She is not diaphoretic.     Comments: Obese  HENT:     Head: Normocephalic and atraumatic.     Nose: Nose normal.     Mouth/Throat:     Pharynx: No oropharyngeal exudate.  Eyes:     General: No scleral icterus.       Right eye: No discharge.        Left eye: No discharge.     Conjunctiva/sclera: Conjunctivae normal.     Pupils: Pupils are equal, round, and reactive to light.  Cardiovascular:      Rate and Rhythm: Normal rate and regular rhythm.     Heart sounds: Normal heart sounds. No murmur heard.   Pulmonary:     Effort: Pulmonary effort is normal. No respiratory distress.     Breath sounds: Normal breath sounds. No wheezing or rales.  Chest:     Chest wall: No tenderness.  Abdominal:     General: Bowel sounds are normal. There is no distension.     Palpations: Abdomen is soft. There   is no mass.     Tenderness: There is no abdominal tenderness. There is no guarding.  Musculoskeletal:        General: Normal range of motion.     Cervical back: Normal range of motion and neck supple.  Lymphadenopathy:     Cervical: No cervical adenopathy.  Skin:    General: Skin is warm and dry.     Findings: No erythema.  Neurological:     Mental Status: She is alert and oriented to person, place, and time.     Cranial Nerves: No cranial nerve deficit.     Motor: No abnormal muscle tone.     Coordination: Coordination normal.  Psychiatric:        Mood and Affect: Affect normal.        Cognition and Memory: Memory normal.        Judgment: Judgment normal.      LABORATORY DATA:  I have reviewed the data as listed Lab Results  Component Value Date   WBC 8.4 09/24/2020   HGB 13.2 09/24/2020   HCT 39.1 09/24/2020   MCV 90.1 09/24/2020   PLT 328 09/24/2020   Recent Labs    06/18/20 0929 09/24/20 1426  NA 146*  --   K 4.6  --   CL 105  --   CO2 24  --   GLUCOSE 105*  --   BUN 10  --   CREATININE 0.82  --   CALCIUM 8.9  --   GFRNONAA 68  --   GFRAA 79  --   PROT 6.9 7.0  ALBUMIN 4.5 4.2  AST 17 17  ALT 18 15  ALKPHOS 75 56  BILITOT 1.8* 1.1  BILIDIR  --  0.2  IBILI  --  0.9    Iron/TIBC/Ferritin/ %Sat    Component Value Date/Time   IRON 184 (H) 06/18/2020 0929   TIBC 314 06/10/2019 1029   FERRITIN 109 06/10/2019 1029   IRONPCTSAT 37 06/10/2019 1029     ASSESSMENT & PLAN:  Patient is a 80 year old female present for left breast DCIS 1. History of breast  cancer, DCIS right breast, 2002   2. Ductal carcinoma in situ (DCIS) of left breast   3. Family history of cancer     #Left breast DCIS high grade.  Mammogram images were independently reviewed by me and discussed with patient.- Discussed with Dr. Bary Castilla who thinks current images are sufficient enough and MRI would not add any benefit in determining extent of disease. Pathology was discussed. Recommend lumpectomy.  Sentinel lymph node biopsy is not routinely performed for DCIS. Discussed with patient about the possibility of invasive component. Estrogen receptor status will be determined on surgical specimen Discussed about adjuvant radiation and possible antiestrogen treatments.  #History of right breast DCIS. # Family history of breast cancer, personal history of DCIS.  Reports that Dr. Tawni Pummel had done genetic testing the past and the patient reports not carrying breast cancer gene.  Discussed with patient that if she is interested in broader panel testing, we can refer her to genetic counselor.  # Elevated iron and bilirubin, she has concerns about elevated iron. Will check iron panel. Repeat LFT  All questions were answered. The patient knows to call the clinic with any problems questions or concerns.  Return of visit: 2 weeks after surgery   Earlie Server, MD, PhD Hematology Oncology [facility] at [facility] Pager- [phone] 09/24/2020

## 2020-09-25 ENCOUNTER — Ambulatory Visit: Payer: Medicare PPO | Admitting: Oncology

## 2020-09-28 ENCOUNTER — Encounter: Payer: Self-pay | Admitting: *Deleted

## 2020-09-29 ENCOUNTER — Other Ambulatory Visit: Payer: Self-pay

## 2020-09-29 ENCOUNTER — Other Ambulatory Visit
Admission: RE | Admit: 2020-09-29 | Discharge: 2020-09-29 | Disposition: A | Discharge status: Home / Self Care | Payer: Medicare PPO | Source: Ambulatory Visit | Attending: General Surgery | Admitting: General Surgery

## 2020-09-29 DIAGNOSIS — H16223 Keratoconjunctivitis sicca, not specified as Sjogren's, bilateral: Secondary | ICD-10-CM | POA: Diagnosis not present

## 2020-09-29 NOTE — Progress Notes (Signed)
Called patient to follow up after her medical oncology consult.  Answered her questions.  No needs at this time.

## 2020-09-29 NOTE — Patient Instructions (Addendum)
Your procedure is scheduled on: Wednesday 10/07/20.   Arrive at [facility] 7:45am.    Remember: Instructions that are not followed completely may result in serious medical risk, up to and including death, or upon the discretion of your surgeon and anesthesiologist your surgery may need to be rescheduled.     __X__ 1. Do not eat food after midnight the night before your procedure.                 No gum chewing or hard candies. You may drink clear liquids up to 2 hours                 before you are scheduled to arrive for your surgery- DO NOT drink clear                 liquids within 2 hours of the start of your surgery.                 Clear Liquids include:  water, apple juice without pulp, clear carbohydrate                 drink such as Clearfast or Gatorade, Black Coffee or Tea (Do not add                 milk or creamer to coffee or tea).  __X__2.  On the morning of surgery brush your teeth with toothpaste and water, you may rinse your mouth with mouthwash if you wish.  Do not swallow any toothpaste or mouthwash.    __X__ 3.  No Alcohol for 24 hours before or after surgery.  __X__ 4.  Do Not Smoke or use e-cigarettes For 24 Hours Prior to Your Surgery.                 Do not use any chewable tobacco products for at least 6 hours prior to                 surgery.  __X__5.  Notify your doctor if there is any change in your medical condition      (cold, fever, infections).      Do NOT wear jewelry, make-up, hairpins, clips or nail polish. Do NOT wear lotions, powders, or perfumes.  Do NOT shave 48 hours prior to surgery. Men may shave face and neck. Do NOT bring valuables to the hospital.     [facility] is not responsible for any belongings or valuables.   Contacts, dentures/partials or body piercings may not be worn into surgery. Bring a case for your contacts, glasses or hearing aids, a denture cup will be supplied.    Patients discharged the day of surgery  will not be allowed to drive home.     __X__ Take these medicines the morning of surgery with A SIP OF WATER:     1. omeprazole (PRILOSEC)   2. tolterodine (DETROL LA)   3. traMADol (ULTRAM)     __X__ Use CHG Soap as directed  __X__ Stop Blood Thinners Coumadin/Plavix/Xarelto/Pleta/Pradaxa/Eliquis/Effient/Aspirin   __X__ Stop Anti-inflammatories 7 days before surgery such as Advil, Ibuprofen, Motrin, BC or Goodies Powder, Naprosyn, Naproxen, Aleve, Aspirin, Meloxicam. May take Tylenol if needed for pain or discomfort.   __X__Do not start taking any new herbal supplements or vitamins prior to your procedure.    Wear comfortable clothing (specific to your surgery type) to the hospital.  Plan for stool softeners for home use; pain medications have a tendency to  cause constipation. You can also help prevent constipation by eating foods high in fiber such as fruits and vegetables and drinking plenty of fluids as your diet allows.  After surgery, you can prevent lung complications by doing breathing exercises.Take deep breaths and cough every 1-2 hours. Your doctor may order a device called an Incentive Spirometer to help you take deep breaths.  Please call the [place] Department at [phone] if you have any questions about these instructions.

## 2020-09-30 ENCOUNTER — Encounter
Admission: RE | Admit: 2020-09-30 | Discharge: 2020-09-30 | Disposition: A | Discharge status: Home / Self Care | Payer: Medicare PPO | Source: Ambulatory Visit | Attending: General Surgery | Admitting: General Surgery

## 2020-09-30 DIAGNOSIS — Z01818 Encounter for other preprocedural examination: Secondary | ICD-10-CM | POA: Insufficient documentation

## 2020-09-30 DIAGNOSIS — I10 Essential (primary) hypertension: Secondary | ICD-10-CM | POA: Insufficient documentation

## 2020-09-30 DIAGNOSIS — Z136 Encounter for screening for cardiovascular disorders: Secondary | ICD-10-CM | POA: Diagnosis not present

## 2020-09-30 LAB — BASIC METABOLIC PANEL
Anion gap: 10 (ref 5–15)
BUN: 13 mg/dL (ref 8–23)
CO2: 26 mmol/L (ref 22–32)
Calcium: 9 mg/dL (ref 8.9–10.3)
Chloride: 104 mmol/L (ref 98–111)
Creatinine, Ser: 0.8 mg/dL (ref 0.44–1.00)
GFR, Estimated: >60 mL/min (ref >60)
Glucose, Bld: 100 mg/dL — ABNORMAL HIGH (ref 70–99)
Potassium: 3.9 mmol/L (ref 3.5–5.1)
Sodium: 140 mmol/L (ref 135–145)

## 2020-10-05 ENCOUNTER — Inpatient Hospital Stay
Admission: RE | Admit: 2020-10-05 | Discharge: 2020-10-05 | Disposition: A | Discharge status: Home / Self Care | Payer: Medicare PPO | Source: Ambulatory Visit

## 2020-10-05 NOTE — Progress Notes (Signed)
No show for covid test. Call to patient, left message to come tomorrow morning.

## 2020-10-06 ENCOUNTER — Other Ambulatory Visit: Payer: Self-pay

## 2020-10-06 ENCOUNTER — Other Ambulatory Visit
Admission: RE | Admit: 2020-10-06 | Discharge: 2020-10-06 | Disposition: A | Discharge status: Home / Self Care | Payer: Medicare PPO | Source: Ambulatory Visit | Attending: General Surgery | Admitting: General Surgery

## 2020-10-06 DIAGNOSIS — Z20822 Contact with and (suspected) exposure to covid-19: Secondary | ICD-10-CM | POA: Diagnosis not present

## 2020-10-06 DIAGNOSIS — Z01812 Encounter for preprocedural laboratory examination: Secondary | ICD-10-CM | POA: Insufficient documentation

## 2020-10-06 LAB — SARS CORONAVIRUS 2 (TAT 6-24 HRS): SARS Coronavirus 2: NEGATIVE

## 2020-10-07 ENCOUNTER — Ambulatory Visit: Payer: Medicare PPO | Admitting: Anesthesiology

## 2020-10-07 ENCOUNTER — Ambulatory Visit
Admission: RE | Admit: 2020-10-07 | Discharge: 2020-10-07 | Disposition: A | Discharge status: Home / Self Care | Payer: Medicare PPO | Attending: General Surgery | Admitting: General Surgery

## 2020-10-07 ENCOUNTER — Encounter
Admission: RE | Disposition: A | Discharge status: Home / Self Care | Payer: Self-pay | Source: Home / Self Care | Attending: General Surgery

## 2020-10-07 ENCOUNTER — Ambulatory Visit
Admission: RE | Admit: 2020-10-07 | Discharge: 2020-10-07 | Disposition: A | Discharge status: Home / Self Care | Payer: Medicare PPO | Source: Ambulatory Visit | Attending: General Surgery | Admitting: General Surgery

## 2020-10-07 ENCOUNTER — Encounter: Payer: Self-pay | Admitting: General Surgery

## 2020-10-07 ENCOUNTER — Other Ambulatory Visit: Payer: Self-pay

## 2020-10-07 DIAGNOSIS — K449 Diaphragmatic hernia without obstruction or gangrene: Secondary | ICD-10-CM | POA: Diagnosis not present

## 2020-10-07 DIAGNOSIS — D0512 Intraductal carcinoma in situ of left breast: Secondary | ICD-10-CM

## 2020-10-07 DIAGNOSIS — Z79899 Other long term (current) drug therapy: Secondary | ICD-10-CM | POA: Diagnosis not present

## 2020-10-07 DIAGNOSIS — D509 Iron deficiency anemia, unspecified: Secondary | ICD-10-CM | POA: Diagnosis not present

## 2020-10-07 DIAGNOSIS — M109 Gout, unspecified: Secondary | ICD-10-CM | POA: Diagnosis not present

## 2020-10-07 DIAGNOSIS — E785 Hyperlipidemia, unspecified: Secondary | ICD-10-CM | POA: Diagnosis not present

## 2020-10-07 DIAGNOSIS — N8111 Cystocele, midline: Secondary | ICD-10-CM | POA: Diagnosis not present

## 2020-10-07 DIAGNOSIS — R0602 Shortness of breath: Secondary | ICD-10-CM | POA: Diagnosis not present

## 2020-10-07 DIAGNOSIS — K219 Gastro-esophageal reflux disease without esophagitis: Secondary | ICD-10-CM | POA: Diagnosis not present

## 2020-10-07 HISTORY — PX: BREAST LUMPECTOMY WITH NEEDLE LOCALIZATION: SHX5759

## 2020-10-07 HISTORY — PX: BREAST LUMPECTOMY: SHX2

## 2020-10-07 SURGERY — BREAST LUMPECTOMY WITH NEEDLE LOCALIZATION
Anesthesia: General | Site: Breast | Laterality: Left

## 2020-10-07 MED ORDER — BUPIVACAINE-EPINEPHRINE (PF) 0.5% -1:200000 IJ SOLN
INTRAMUSCULAR | Status: DC | PRN
  Administered 2020-10-07: 30 mL via PERINEURAL

## 2020-10-07 MED ORDER — FENTANYL CITRATE (PF) 100 MCG/2ML IJ SOLN: 25.0000 ug | INTRAMUSCULAR | Status: DC | PRN

## 2020-10-07 MED ORDER — BUPIVACAINE HCL (PF) 0.5 % IJ SOLN
INTRAMUSCULAR | Status: AC
  Filled 2020-10-07: qty 30

## 2020-10-07 MED ORDER — DEXAMETHASONE SODIUM PHOSPHATE 10 MG/ML IJ SOLN
INTRAMUSCULAR | Status: AC
  Filled 2020-10-07: qty 1

## 2020-10-07 MED ORDER — OXYCODONE HCL 5 MG/5ML PO SOLN: 5.0000 mg | Freq: Once | ORAL | Status: DC | PRN

## 2020-10-07 MED ORDER — OXYCODONE HCL 5 MG PO TABS
5.0000 mg | ORAL_TABLET | Freq: Once | ORAL | Status: DC | PRN
Start: 2020-10-07 — End: 2020-10-07

## 2020-10-07 MED ORDER — LIDOCAINE HCL (PF) 2 % IJ SOLN
INTRAMUSCULAR | Status: AC
  Filled 2020-10-07: qty 5

## 2020-10-07 MED ORDER — FENTANYL CITRATE (PF) 250 MCG/5ML IJ SOLN
INTRAMUSCULAR | Status: AC
  Filled 2020-10-07: qty 5

## 2020-10-07 MED ORDER — ORAL CARE MOUTH RINSE: 15.0000 mL | Freq: Once | OROMUCOSAL | Status: AC

## 2020-10-07 MED ORDER — PROPOFOL 10 MG/ML IV BOLUS
INTRAVENOUS | Status: DC | PRN
  Administered 2020-10-07: 100 mg via INTRAVENOUS

## 2020-10-07 MED ORDER — ONDANSETRON HCL 4 MG/2ML IJ SOLN
INTRAMUSCULAR | Status: DC | PRN
  Administered 2020-10-07: 4 mg via INTRAVENOUS

## 2020-10-07 MED ORDER — TRAMADOL HCL 50 MG PO TABS: 50.0000 mg | ORAL_TABLET | Freq: Four times a day (QID) | ORAL | 0 refills | Status: DC | PRN

## 2020-10-07 MED ORDER — LIDOCAINE HCL (CARDIAC) PF 100 MG/5ML IV SOSY
PREFILLED_SYRINGE | INTRAVENOUS | Status: DC | PRN
  Administered 2020-10-07: 50 mg via INTRAVENOUS

## 2020-10-07 MED ORDER — ONDANSETRON HCL 4 MG/2ML IJ SOLN
INTRAMUSCULAR | Status: AC
  Filled 2020-10-07: qty 2

## 2020-10-07 MED ORDER — PROPOFOL 10 MG/ML IV BOLUS
INTRAVENOUS | Status: AC
  Filled 2020-10-07: qty 20

## 2020-10-07 MED ORDER — DEXAMETHASONE SODIUM PHOSPHATE 10 MG/ML IJ SOLN
INTRAMUSCULAR | Status: DC | PRN
  Administered 2020-10-07: 10 mg via INTRAVENOUS

## 2020-10-07 MED ORDER — EPINEPHRINE PF 1 MG/ML IJ SOLN
INTRAMUSCULAR | Status: AC
  Filled 2020-10-07: qty 1

## 2020-10-07 MED ORDER — FENTANYL CITRATE (PF) 100 MCG/2ML IJ SOLN
INTRAMUSCULAR | Status: DC | PRN
  Administered 2020-10-07 (×5): 50 ug via INTRAVENOUS

## 2020-10-07 MED ORDER — LACTATED RINGERS IV SOLN
INTRAVENOUS | Status: DC
  Administered 2020-10-07: 10 mL/h via INTRAVENOUS

## 2020-10-07 MED ORDER — CHLORHEXIDINE GLUCONATE 0.12 % MT SOLN: 15.0000 mL | Freq: Once | OROMUCOSAL | Status: AC

## 2020-10-07 MED ORDER — CHLORHEXIDINE GLUCONATE CLOTH 2 % EX PADS: 6.0000 | MEDICATED_PAD | Freq: Once | CUTANEOUS | Status: DC

## 2020-10-07 MED ORDER — ACETAMINOPHEN 10 MG/ML IV SOLN
INTRAVENOUS | Status: DC | PRN
  Administered 2020-10-07: 1000 mg via INTRAVENOUS

## 2020-10-07 MED ORDER — ACETAMINOPHEN 10 MG/ML IV SOLN
INTRAVENOUS | Status: AC
  Filled 2020-10-07: qty 100

## 2020-10-07 MED ORDER — CHLORHEXIDINE GLUCONATE 0.12 % MT SOLN
OROMUCOSAL | Status: AC
  Administered 2020-10-07: 15 mL via OROMUCOSAL
  Filled 2020-10-07: qty 15

## 2020-10-07 SURGICAL SUPPLY — 47 items
APL PRP STRL LF DISP 70% ISPRP (MISCELLANEOUS) ×1
BINDER BREAST LRG (GAUZE/BANDAGES/DRESSINGS) IMPLANT
BINDER BREAST MEDIUM (GAUZE/BANDAGES/DRESSINGS) IMPLANT
BINDER BREAST XLRG (GAUZE/BANDAGES/DRESSINGS) ×1 IMPLANT
BINDER BREAST XXLRG (GAUZE/BANDAGES/DRESSINGS) IMPLANT
BLADE BOVIE TIP EXT 4 (BLADE) IMPLANT
BLADE SURG 15 STRL SS SAFETY (BLADE) ×4 IMPLANT
CANISTER SUCT 1200ML W/VALVE (MISCELLANEOUS) ×2 IMPLANT
CHLORAPREP W/TINT 26 (MISCELLANEOUS) ×3 IMPLANT
CNTNR SPEC 2.5X3XGRAD LEK (MISCELLANEOUS)
CONT SPEC 4OZ STER OR WHT (MISCELLANEOUS)
CONT SPEC 4OZ STRL OR WHT (MISCELLANEOUS)
CONTAINER SPEC 2.5X3XGRAD LEK (MISCELLANEOUS) IMPLANT
COVER PROBE FLX POLY STRL (MISCELLANEOUS) ×2 IMPLANT
COVER WAND RF STERILE (DRAPES) ×2 IMPLANT
DEVICE DUBIN SPECIMEN MAMMOGRA (MISCELLANEOUS) ×2 IMPLANT
DRAPE LAPAROTOMY 100X77 ABD (DRAPES) ×2 IMPLANT
DRSG GAUZE FLUFF 36X18 (GAUZE/BANDAGES/DRESSINGS) ×3 IMPLANT
DRSG TELFA 4X3 1S NADH ST (GAUZE/BANDAGES/DRESSINGS) ×4 IMPLANT
ELECT CAUTERY BLADE TIP 2.5 (TIP) ×2
ELECT REM PT RETURN 9FT ADLT (ELECTROSURGICAL) ×2
ELECTRODE CAUTERY BLDE TIP 2.5 (TIP) ×1 IMPLANT
ELECTRODE REM PT RTRN 9FT ADLT (ELECTROSURGICAL) ×1 IMPLANT
GLOVE BIO SURGEON STRL SZ7.5 (GLOVE) ×3 IMPLANT
GLOVE INDICATOR 8.0 STRL GRN (GLOVE) ×2 IMPLANT
GOWN STRL REUS W/ TWL LRG LVL3 (GOWN DISPOSABLE) ×2 IMPLANT
GOWN STRL REUS W/TWL LRG LVL3 (GOWN DISPOSABLE) ×4
KIT TURNOVER KIT A (KITS) ×2 IMPLANT
LABEL OR SOLS (LABEL) ×2 IMPLANT
MANIFOLD NEPTUNE II (INSTRUMENTS) ×2 IMPLANT
MARGIN MAP 10MM (MISCELLANEOUS) ×2 IMPLANT
NDL HYPO 25X1 1.5 SAFETY (NEEDLE) ×1 IMPLANT
NEEDLE HYPO 22GX1.5 SAFETY (NEEDLE) ×2 IMPLANT
NEEDLE HYPO 25X1 1.5 SAFETY (NEEDLE) ×2 IMPLANT
PACK BASIN MINOR ARMC (MISCELLANEOUS) ×2 IMPLANT
RETRACTOR RING XSMALL (MISCELLANEOUS) IMPLANT
RTRCTR WOUND ALEXIS 13CM XS SH (MISCELLANEOUS) ×2
STRIP CLOSURE SKIN 1/2X4 (GAUZE/BANDAGES/DRESSINGS) ×2 IMPLANT
SUT ETHILON 3-0 FS-10 30 BLK (SUTURE) ×2
SUT VIC AB 2-0 CT1 27 (SUTURE) ×2
SUT VIC AB 2-0 CT1 TAPERPNT 27 (SUTURE) ×1 IMPLANT
SUT VIC AB 4-0 FS2 27 (SUTURE) ×2 IMPLANT
SUTURE EHLN 3-0 FS-10 30 BLK (SUTURE) ×1 IMPLANT
SWABSTK COMLB BENZOIN TINCTURE (MISCELLANEOUS) ×2 IMPLANT
SYR 10ML LL (SYRINGE) ×2 IMPLANT
TAPE TRANSPORE STRL 2 31045 (GAUZE/BANDAGES/DRESSINGS) IMPLANT
WATER STERILE IRR 1000ML POUR (IV SOLUTION) ×2 IMPLANT

## 2020-10-07 NOTE — Anesthesia Postprocedure Evaluation (Signed)
Anesthesia Post Note  Patient: Claire Kane  Procedure(s) Performed: BREAST LUMPECTOMY WITH NEEDLE LOCALIZATION (Left Breast)  Patient location during evaluation: PACU Anesthesia Type: General Level of consciousness: awake and alert Pain management: pain level controlled Vital Signs Assessment: post-procedure vital signs reviewed and stable Respiratory status: spontaneous breathing, nonlabored ventilation, respiratory function stable and patient connected to nasal cannula oxygen Cardiovascular status: blood pressure returned to baseline and stable Postop Assessment: no apparent nausea or vomiting Anesthetic complications: no   No complications documented.   Last Vitals:  Vitals:   10/07/20 1339 10/07/20 1406  BP: (!) 143/64 128/62  Pulse: 88 88  Resp: 16 16  Temp: 36.5 C   SpO2: 98% 97%    Last Pain:  Vitals:   10/07/20 1406  TempSrc:   PainSc: 0-No pain                 Precious Haws Zyier Dykema

## 2020-10-07 NOTE — Anesthesia Preprocedure Evaluation (Signed)
Anesthesia Evaluation  Patient identified by MRN, date of birth, ID band Patient awake    Reviewed: Allergy & Precautions, H&P , NPO status , Patient's Chart, lab work & pertinent test results  History of Anesthesia Complications Negative for: history of anesthetic complications  Airway Mallampati: III  TM Distance: <3 FB Neck ROM: full    Dental  (+) Chipped, Poor Dentition, Missing   Pulmonary neg shortness of breath,    Pulmonary exam normal        Cardiovascular Exercise Tolerance: Good hypertension, (-) angina(-) Past MI Normal cardiovascular exam     Neuro/Psych PSYCHIATRIC DISORDERS negative neurological ROS     GI/Hepatic Neg liver ROS, hiatal hernia, GERD  Medicated and Controlled,  Endo/Other  negative endocrine ROS  Renal/GU      Musculoskeletal  (+) Arthritis ,   Abdominal   Peds  Hematology negative hematology ROS (+)   Anesthesia Other Findings Past Medical History: No date: Anemia     Comment:  fe deficiency now improved No date: Anxiety No date: Arthritis 08/04/2015: BRCA negative     Comment:  BRCA1 and BRCA2 2002: 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  No date: Cataract No date: GERD (gastroesophageal reflux disease) 2001: Heart murmur No date: Hiatal hernia No date: High cholesterol No date: Hypertension     Comment:  patient denies anyone telling her that she has htn 2002: Personal history of radiation therapy     Comment:  BREAST CA No date: Rosacea No date: 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 complications  Past Surgical History: No date: ABDOMINAL HYSTERECTOMY No date: BLEPHAROPLASTY 09/15/2020: BREAST BIOPSY; Left     Comment:  affirm bx, x marker, DCIS 2003: BREAST CYST ASPIRATION; Left 2002: BREAST EXCISIONAL BIOPSY; Right     Comment:  POS 2002: BREAST LUMPECTOMY; Right     Comment:  w/ radiation 10/07/2020: BREAST LUMPECTOMY; Left 2002:  BREAST SURGERY; Right     Comment:  lumpectomy No date: CHOLECYSTECTOMY 2013: COLONOSCOPY 12/08/2015: COLONOSCOPY WITH PROPOFOL; N/A     Comment:  Procedure: COLONOSCOPY WITH PROPOFOL;  Surgeon:               Seeplaputhur G Sankar, MD;  Location: [facility] ENDOSCOPY;                Service: Endoscopy;  Laterality: N/A; 12/06/2017: COLONOSCOPY WITH PROPOFOL; N/A     Comment:  Procedure: COLONOSCOPY WITH PROPOFOL;  Surgeon: Anna,               Kiran, MD;  Location: [facility] ENDOSCOPY;  Service:               Gastroenterology;  Laterality: N/A; 12/22/2017: COLONOSCOPY WITH PROPOFOL; N/A     Comment:  Procedure: COLONOSCOPY WITH PROPOFOL;  Surgeon: Anna,               Kiran, MD;  Location: [facility] ENDOSCOPY;  Service:               Gastroenterology;  Laterality: N/A; 12/08/2015: ESOPHAGOGASTRODUODENOSCOPY (EGD) WITH PROPOFOL; N/A     Comment:  Procedure: ESOPHAGOGASTRODUODENOSCOPY (EGD) WITH               PROPOFOL;  Surgeon: Seeplaputhur G Sankar, MD;  Location:              [facility] ENDOSCOPY;  Service: Endoscopy;  Laterality: N/A; 12/06/2017: ESOPHAGOGASTRODUODENOSCOPY (EGD) WITH PROPOFOL; N/A     Comment:  Procedure: ESOPHAGOGASTRODUODENOSCOPY (EGD) WITH                 PROPOFOL;  Surgeon: Anna, Kiran, MD;  Location: [facility]               ENDOSCOPY;  Service: Gastroenterology;  Laterality: N/A; 12/22/2017: ESOPHAGOGASTRODUODENOSCOPY (EGD) WITH PROPOFOL; N/A     Comment:  Procedure: ESOPHAGOGASTRODUODENOSCOPY (EGD) WITH               PROPOFOL;  Surgeon: Anna, Kiran, MD;  Location: [facility]               ENDOSCOPY;  Service: Gastroenterology;  Laterality: N/A; 2014: EYE SURGERY; Right 2014: EYE SURGERY; Left 01/23/2018: GIVENS CAPSULE STUDY; N/A     Comment:  Procedure: GIVENS CAPSULE STUDY;  Surgeon: Anna, Kiran,               MD;  Location: [facility] ENDOSCOPY;  Service:               Gastroenterology;  Laterality: N/A; No date: iron infusion     Comment:  had 5 iron infusions for anemia No date: post radiation therapy;  Right     Comment:  right breast cancer 2002 2010: Stab pheblectomy  2008: vein closure procedure; Bilateral  BMI    Body Mass Index: 32.09 kg/m      Reproductive/Obstetrics negative OB ROS                             Anesthesia Physical Anesthesia Plan  ASA: III  Anesthesia Plan: General LMA   Post-op Pain Management:    Induction: Intravenous  PONV Risk Score and Plan: Dexamethasone, Ondansetron, Midazolam and Treatment may vary due to age or medical condition  Airway Management Planned: LMA  Additional Equipment:   Intra-op Plan:   Post-operative Plan: Extubation in OR  Informed Consent: I have reviewed the patients History and Physical, chart, labs and discussed the procedure including the risks, benefits and alternatives for the proposed anesthesia with the patient or authorized representative who has indicated his/her understanding and acceptance.     Dental Advisory Given  Plan Discussed with: Anesthesiologist, CRNA and Surgeon  Anesthesia Plan Comments: (Patient consented for risks of anesthesia including but not limited to:  - adverse reactions to medications - damage to eyes, teeth, lips or other oral mucosa - nerve damage due to positioning  - sore throat or hoarseness - Damage to heart, brain, nerves, lungs, other parts of body or loss of life  Patient voiced understanding.)        Anesthesia Quick Evaluation  

## 2020-10-07 NOTE — H&P (Signed)
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 For wide excision.   Medications Prior to Admission  Medication Sig Dispense Refill Last Dose  . atorvastatin (LIPITOR) 10 MG tablet Take 1 tablet (10 mg total) by mouth daily. 90 tablet 3 10/06/2020 at 2200  . calcium citrate (CALCITRATE - DOSED IN MG ELEMENTAL CALCIUM) 950 MG tablet Take 200 mg of elemental calcium by mouth 2 (two) times daily.    10/06/2020 at 0800  . docusate sodium (COLACE) 100 MG capsule Take 100 mg by mouth daily as needed for mild constipation.   06/19/2020  . ezetimibe (ZETIA) 10 MG tablet Take 10 mg by mouth daily.   10/06/2020 at 2200  . Glucosamine 750 MG TABS Take 750 mg by mouth daily.   10/04/2020  . Glycerin-Polysorbate 80 (REFRESH DRY EYE THERAPY OP) Apply to eye 2 (two) times daily.   10/06/2020 at 2200  . Multiple Vitamins-Minerals (MULTIVITAMIN WITH MINERALS) tablet Take 1 tablet by mouth daily.   10/06/2020 at 0800  . omeprazole (PRILOSEC) 20 MG capsule Take 1 capsule (20 mg total) by mouth daily. 90 capsule 3 10/07/2020 at 0600  . psyllium (REGULOID) 0.52 G capsule Take 2 capsules by mouth daily.   10/04/2020  . SOOLANTRA 1 % CREA Apply 1 application topically daily as needed (rosacea).   10/04/2020  . spironolactone (ALDACTONE) 50 MG tablet Take 50 mg by mouth daily.   10/06/2020 at 0800  . tolterodine (DETROL LA) 2 MG 24 hr capsule Take 1 capsule (2 mg total) by mouth daily. 30 capsule 11 10/07/2020 at 0600  . traMADol (ULTRAM) 50 MG tablet Take 1 tablet (50 mg total) by mouth every 6 (six) hours as needed. (Patient taking differently: Take 50 mg by mouth every 6 (six) hours as needed for severe pain.) 100 tablet 2 10/07/2020 at 0600  . ferrous sulfate 325 (65 FE) MG EC tablet Take 1 tablet (325 mg total) by mouth 2 (two) times daily with a meal. 180 tablet 1    No Active Allergies Past Medical History:  Diagnosis Date  . Anemia    fe deficiency now improved  .  Anxiety   . Arthritis   . 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 Breast cancer ([place]) 2002   DCIS right breast   . Cataract   . GERD (gastroesophageal reflux disease)   . Heart murmur 2001  . Hiatal hernia   . High cholesterol   . Hypertension    patient denies anyone telling her that she has htn  . Personal history of radiation therapy 2002   BREAST CA  . Rosacea   . 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 complications    Past Surgical History:  Procedure Laterality Date  . ABDOMINAL HYSTERECTOMY    . BLEPHAROPLASTY    . BREAST BIOPSY Left 09/15/2020   affirm bx, x marker, DCIS  . BREAST CYST ASPIRATION Left 2003  . BREAST EXCISIONAL BIOPSY Right 2002   POS  . BREAST LUMPECTOMY Right 2002   w/ radiation  . BREAST LUMPECTOMY Left 10/07/2020  . BREAST SURGERY Right 2002   lumpectomy  . CHOLECYSTECTOMY    . COLONOSCOPY  2013  . COLONOSCOPY WITH PROPOFOL N/A 12/08/2015   Procedure: COLONOSCOPY WITH PROPOFOL;  Surgeon: Christene Lye, MD;  Location: [facility] ENDOSCOPY;  Service: Endoscopy;  Laterality: N/A;  . COLONOSCOPY WITH PROPOFOL N/A 12/06/2017  Procedure: COLONOSCOPY WITH PROPOFOL;  Surgeon: Jonathon Bellows, MD;  Location: [facility] ENDOSCOPY;  Service: Gastroenterology;  Laterality: N/A;  . COLONOSCOPY WITH PROPOFOL N/A 12/22/2017   Procedure: COLONOSCOPY WITH PROPOFOL;  Surgeon: Jonathon Bellows, MD;  Location: [facility] ENDOSCOPY;  Service: Gastroenterology;  Laterality: N/A;  . ESOPHAGOGASTRODUODENOSCOPY (EGD) WITH PROPOFOL N/A 12/08/2015   Procedure: ESOPHAGOGASTRODUODENOSCOPY (EGD) WITH PROPOFOL;  Surgeon: Christene Lye, MD;  Location: [facility] ENDOSCOPY;  Service: Endoscopy;  Laterality: N/A;  . ESOPHAGOGASTRODUODENOSCOPY (EGD) WITH PROPOFOL N/A 12/06/2017   Procedure: ESOPHAGOGASTRODUODENOSCOPY (EGD) WITH PROPOFOL;  Surgeon: Jonathon Bellows, MD;  Location: [facility] ENDOSCOPY;  Service: Gastroenterology;  Laterality: N/A;  . ESOPHAGOGASTRODUODENOSCOPY (EGD) WITH PROPOFOL  N/A 12/22/2017   Procedure: ESOPHAGOGASTRODUODENOSCOPY (EGD) WITH PROPOFOL;  Surgeon: Jonathon Bellows, MD;  Location: [facility] ENDOSCOPY;  Service: Gastroenterology;  Laterality: N/A;  . EYE SURGERY Right 2014  . EYE SURGERY Left 2014  . GIVENS CAPSULE STUDY N/A 01/23/2018   Procedure: GIVENS CAPSULE STUDY;  Surgeon: Jonathon Bellows, MD;  Location: [facility] ENDOSCOPY;  Service: Gastroenterology;  Laterality: N/A;  . iron infusion     had 5 iron infusions for anemia  . post radiation therapy Right    right breast cancer 2002  . Stab pheblectomy   2010  . vein closure procedure Bilateral 2008   Social History   Socioeconomic History  . Marital status: Married    Spouse name: larry  . Number of children: 2  . Years of education: Not on file  . Highest education level: Bachelor's degree (e.g., BA, AB, BS)  Occupational History  . Occupation: Pharmacist, hospital    Comment: retired  Tobacco Use  . Smoking status: Never Smoker  . Smokeless tobacco: Never Used  Vaping Use  . Vaping Use: Never used  Substance and Sexual Activity  . Alcohol use: No  . Drug use: No  . Sexual activity: Never  Other Topics Concern  . Not on file  Social History Narrative   Patient lives with husband. She feels safe in her home.   Social Determinants of Health   Financial Resource Strain: Not on file  Food Insecurity: Not on file  Transportation Needs: Not on file  Physical Activity: Not on file  Stress: Not on file  Social Connections: Not on file  Intimate Partner Violence: Not on file   Social History   Social History Narrative   Patient lives with husband. She feels safe in her home.     ROS: Negative.     PE: HEENT: Negative. Lungs: Clear. Cardio: RR.   Assessment/Plan:  Proceed with planned wide excision left breast DCIS.   Forest Gleason [facility] 10/07/2020

## 2020-10-07 NOTE — Op Note (Addendum)
Preoperative diagnosis: High-grade DCIS of the left breast, upper outer quadrant.  Postoperative diagnosis: Same.  Operative procedure: Left breast wide excision with ultrasound and wire localization.  Operating surgeon: Hervey Ard, MD.  Anesthesia: General by LMA, Marcaine 0.5% with 1: 200,000 units of epinephrine, 30 cc.  Estimated blood loss: 2 cc.  Clinical note: This 80 year old woman was recently identified with a small foci of high-grade DCIS in the left breast.  She desired breast conservation.  She underwent wire localization prior to the procedure and tolerated this well.  SCD stockings for DVT prevention.  Antibiotics were not indicated.  Operative note: With the patient under adequate general anesthesia the breast was carefully prepped to avoid dislodging the previously placed localizing wire.  Ultrasound was used to identify the site of the previous biopsy.  This area was outlined as a 2-1/2 cm block for removal. An image was placed in the permanent record. Local anesthesia was infiltrated.  A curvilinear incision from the 12 to 3 o'clock position was made carried down through skin subtendinous tissue.  Hemostasis was electrocautery.  1 cm thick flaps were then elevated separating the adipose tissue from the underlying breast parenchyma and an [place] wound protector was placed.  The localizing wire was brought into the surgical field and the above-noted 2.5 cm block of tissue was excised.  The specimen was orientated and specimen radiograph confirmed both the intact wire tip and the previously placed marker.  This was then sent to pathology who reported no evidence of discrete mass.  Margin pending permanent sections.  The breast tissue was elevated off the underlying pectoralis muscle circumferentially and then this deep breast parenchyma and the fascia was approximated with interrupted 2-0 Vicryl figure-of-eight sutures.  The superficial adipose tissue and breast parenchyma was  approximated in similar fashion to obliterate dead space.  The skin was mobilized as flaps both superiorly and inferiorly minimize rippling in the axillary area.  The skin was approximated with a running 4-0 Vicryl subcuticular suture.  Benzoin and Steri-Strips were applied followed by Telfa, fluff gauze and a compressive wrap.  Patient tolerated the procedure well and was taken recovery in stable condition.

## 2020-10-07 NOTE — Transfer of Care (Signed)
Immediate Anesthesia Transfer of Care Note  Patient: Claire Kane  Procedure(s) Performed: BREAST LUMPECTOMY WITH NEEDLE LOCALIZATION (Left Breast)  Patient Location: PACU  Anesthesia Type:General  Level of Consciousness: sedated  Airway & Oxygen Therapy: Patient Spontanous Breathing and Patient connected to face mask oxygen  Post-op Assessment: Report given to RN and Post -op Vital signs reviewed and stable  Post vital signs: Reviewed and stable  Last Vitals:  Vitals Value Taken Time  BP 145/69 10/07/20 1300  Temp    Pulse 83 10/07/20 1301  Resp    SpO2 98 % 10/07/20 1301  Vitals shown include unvalidated device data.  Last Pain:  Vitals:   10/07/20 1026  TempSrc: Oral  PainSc: 0-No pain      Patients Stated Pain Goal: 0 (81/44/81 8563)  Complications: No complications documented.

## 2020-10-07 NOTE — Anesthesia Procedure Notes (Signed)
Procedure Name: LMA Insertion Performed by: Minor Iden R, CRNA Pre-anesthesia Checklist: Patient identified, Emergency Drugs available, Suction available and Patient being monitored Patient Re-evaluated:Patient Re-evaluated prior to induction Oxygen Delivery Method: Circle system utilized Preoxygenation: Pre-oxygenation with 100% oxygen Induction Type: IV induction LMA: LMA inserted LMA Size: 3.0 Tube type: Oral Number of attempts: 1 Placement Confirmation: positive ETCO2 and breath sounds checked- equal and bilateral Dental Injury: Teeth and Oropharynx as per pre-operative assessment        

## 2020-10-07 NOTE — Discharge Instructions (Signed)

## 2020-10-08 ENCOUNTER — Ambulatory Visit: Payer: Medicare PPO | Admitting: Oncology

## 2020-10-08 ENCOUNTER — Other Ambulatory Visit: Payer: Self-pay | Admitting: Pathology

## 2020-10-08 ENCOUNTER — Encounter: Payer: Self-pay | Admitting: *Deleted

## 2020-10-08 NOTE — Progress Notes (Signed)
Called patient.  She is one day post op.  States she is doing well.  No pain, just needs to take a Tylenol.  She had questions about her post op dressing.  Encouraged to keep the dressing dry and intact.  She follows up next week with Dr. Bary Castilla.

## 2020-10-09 LAB — SURGICAL PATHOLOGY

## 2020-10-19 ENCOUNTER — Other Ambulatory Visit: Payer: Self-pay | Admitting: General Surgery

## 2020-10-19 NOTE — Progress Notes (Signed)
Subjective:     Patient ID: Claire Kane is a 80 y.o. female.  HPI  The following portions of the patient's history were reviewed and updated as appropriate.  This an established patient is here today for: office visit. The patient is here today to follow up from a left lumpectomy done on 10-07-20. Patient reports she is doing well from surgery.  The patient had been notified by phone of the pathology results.      Chief Complaint  Patient presents with  . Post Operative Visit  . left breast wide excision w/nl     BP (!) 142/78   Pulse 86   Temp 36.4 C (97.6 F)   Ht 162.6 cm (5' 4" )   Wt 84.4 kg (186 lb)   SpO2 97%   BMI 31.93 kg/m       Past Medical History:  Diagnosis Date  . Anemia   . Anxiety   . Arthritis   . BRCA negative 08/04/2015  . Cardiac murmur 2001  . Cataract   . GERD (gastroesophageal reflux disease)   . Hiatal hernia   . High cholesterol   . Hypertension   . Malignant neoplasm of left female breast (CMS-HCC) 09/15/2020  . Malignant neoplasm of right female breast (CMS-HCC) 10/03/2000   DCIS, intermediate grade, 3 mm, wide excision, whole breast radiation, tamoxifen  . Personal history of radiation therapy 2002  . Rosacea   . Varicose veins of lower extremity           Past Surgical History:  Procedure Laterality Date  . ABDOMINAL HYSTERECTOMY    . ASPIRATION CYST BREAST Left 2003  . BREAST EXCISIONAL BIOPSY Left 09/15/2020  . BREAST EXCISIONAL BIOPSY Right 2002  . CATARACT EXTRACTION  2014, 2016  . CHOLECYSTECTOMY    . CHOLECYSTECTOMY  1979  . COLONOSCOPY  12/22/2017  . COLONOSCOPY  12/06/2017  . COLONOSCOPY  12/08/2015  . COLONOSCOPY  2013  . EGD  12/22/2017  . EGD  12/06/2017  . EGD  12/08/2015  . givens capsule study  01/23/2018  . HYSTERECTOMY  1991  . MASTECTOMY PARTIAL / LUMPECTOMY Right 2002  . MASTECTOMY PARTIAL / LUMPECTOMY Left 10/07/2020  . right eye surgery  2014  . STAB PHLEBECTOMY  VARICOSE VEINS ONE EXTREMITY  2010  . vein closure procedure Bilateral 2008              OB History    Gravida  2   Para  2   Term      Preterm      AB      Living        SAB      IAB      Ectopic      Molar      Multiple      Live Births          Obstetric Comments  Age at first period 83 Age of first pregnancy 43        Social History          Socioeconomic History  . Marital status: Married    Spouse name: Not on file  . Number of children: Not on file  . Years of education: Not on file  . Highest education level: Not on file  Occupational History  . Not on file  Tobacco Use  . Smoking status: Never Smoker  . Smokeless tobacco: Never Used  Substance and Sexual Activity  . Alcohol use: Never  .  Drug use: No  . Sexual activity: Not Currently    Partners: Male    Birth control/protection: Surgical, Post-menopausal  Other Topics Concern  . Not on file  Social History Narrative  . Not on file   Social Determinants of Health   Financial Resource Strain: Not on file  Food Insecurity: Not on file  Transportation Needs: Not on file       No Known Allergies  Current Medications        Current Outpatient Medications  Medication Sig Dispense Refill  . *calcium polycarbophil oral 2 tab by mouth daily    . *glucosamine hcl/chondroitin sulfate a oral 1 tab by mouth daily    . atorvastatin (LIPITOR) 10 MG tablet TAKE 1 TABLET BY MOUTH ONCE DAILY 90 tablet 3  . doxycycline (ORACEA) 40 mg capsule 1 cap by mouth daily as needed    . ezetimibe (ZETIA) 10 mg tablet Take 1 tablet (10 mg total) by mouth once daily 90 tablet 3  . ferrous sulfate 325 (65 FE) MG tablet Take 325 mg by mouth 2 (two) times daily with meals  1  . metroNIDAZOLE (METROGEL) 0.75 % gel APPLY TO AFFECTED AREA ON FACE BID  3  . multivitamin-minerals-lutein (CENTRUM SILVER) Tab 1 tab by mouth daily    . omega-3 fatty acids/fish oil 340-1,000 mg  capsule Take 1 capsule by mouth once daily       . omeprazole (PRILOSEC) 20 MG DR capsule   0  . psyllium (METAMUCIL) 0.52 gram capsule Take 0.52 g by mouth once daily. Take 2 capsules once a day    . ranitidine (ZANTAC) 150 MG tablet     1  . SOOLANTRA 1 % Crea Reported on 12/17/2015    0  . spironolactone (ALDACTONE) 50 MG tablet   0  . tolterodine (DETROL LA) 2 MG LA capsule Take by mouth    . tolterodine (DETROL LA) 2 MG LA capsule Take by mouth    . VANIQA 13.9 % cream APPLY TO AFFECTED AREAS PRN  11  . WELCHOL 625 mg tablet take 6 tablets by mouth once daily 540 tablet 3  . aspirin 81 mg tablet 1 tab by mouth daily (Patient not taking: Reported on 10/15/2020  )     No current facility-administered medications for this visit.           Family History  Problem Relation Age of Onset  . Myocardial Infarction (Heart attack) Mother   . Sudden cardiac death Mother        age 49  . Myocardial Infarction (Heart attack) Father   . Sudden cardiac death Father        age 64  . Stroke Sister        Half sister  . Myocardial Infarction (Heart attack) Sister   . Myocardial Infarction (Heart attack) Brother   . Breast cancer Cousin        maternal x 3  . Breast cancer Maternal Aunt   . Breast cancer Niece        maternal  . Aneurysm Brother        had aneurysm at age 29 totally disabled died of sepsis at age 46  . Deep vein thrombosis (DVT or abnormal blood clot formation) Daughter        age 78 clot damaged lungs  . Colon cancer Neg Hx       Review of Systems  Constitutional: Negative for chills and fever.  Respiratory: Negative  for cough.        Objective:   Physical Exam Exam conducted with a chaperone present.  Constitutional:      Appearance: Normal appearance.  Cardiovascular:     Rate and Rhythm: Normal rate and regular rhythm.  Pulmonary:     Effort: Pulmonary effort is normal.     Breath sounds: Normal breath sounds.   Chest:  Breasts:     Left: No axillary adenopathy or supraclavicular adenopathy.        Comments: Curvilinear incision from 12-3:00 healing well.  No evidence of hematoma or seroma. Lymphadenopathy:     Upper Body:     Left upper body: No supraclavicular or axillary adenopathy.  Neurological:     Mental Status: She is alert and oriented to person, place, and time.  Psychiatric:        Mood and Affect: Mood normal.        Behavior: Behavior normal.     Labs and Radiology:   Pathology of October 08, 2019: A. BREAST, LEFT; LUMPECTOMY:  - RESIDUAL HIGH-GRADE DUCTAL CARCINOMA IN SITU (DCIS) WITH ASSOCIATED  CALCIFICATIONS.  - SEE CANCER SUMMARY AND COMMENT BELOW.  - BIOPSY SITE CHANGE WITH METALLIC CLIP.   CANCER CASE SUMMARY: DUCTAL CARCINOMA IN SITU OF THE BREAST  Standard(s): AJCC-UICC 8   SPECIMEN  Procedure: Lumpectomy  Specimen Laterality: Left   TUMOR  Histologic Type: Ductal carcinoma in situ  Size (Extent) of DCIS: at least 12 mm  Nuclear Grade: Grade III (high)  Necrosis: Present, central (expansive comedonecrosis)   MARGINS  Margin Status: Margin(s) Involved by DCIS:            Anterior, unifocal        Inferior, unifocal    Comment:  Residual DCIS is present in the tissue around the biopsy site and is  close to the anterior and inferior margins in several sections. DCIS is  focally present at the anterior and inferior margins. Cauterized tissue  suspicious for cauterized DCIS is present focally at the medial margin.  DCIS is focally present 1.4 mm tothe superior margin.             Size of specimen: 4.7 (medial to lateral) x 3.1 (superior to inferior) x  2.8 (anterior to posterior) cm   CASE SUMMARY: BREAST BIOMARKER TESTS  TEST(S) PERFORMED:  Estrogen Receptor (ER) Status: POSITIVE      Percentage of cells with nuclear positivity: Greater than 90%      Average intensity of staining: Strong   September 07, 2020 diagnostic mammogram, left:  IMPRESSION: Indeterminate 1.4 cm group of calcifications in the upper outer left breast. Tissue sampling is recommended.  Review of the operative note describes a 2.5cm cube of tissue being excised.  Under compression this went out to 5 cm.  Review image of the resected tissue showed the calcifications within the center of the block of tissue.  In spite of this a close superior margin and positive anterior and inferior margin and close medial margin is noted.  This would suggest a far greater volume of tissue than evident mammographically.  From the operative report:  1 cm thick flaps were then elevated separating the adipose tissue from the underlying breast parenchyma and an [place] wound protector was placed.     Assessment:     Positive margins post wide excision.    Plan:     Implications of positive margins on local recurrence rates were reviewed in detail.  In  summary, individuals with positive microscopic margins or less than 2 mm have a doubling of in breast recurrence rate from 3-6% to 6-12% (assuming tolerance of antiestrogen therapy).  Half of these recurrences will be additional DCIS.  Pros and cons of reexcision versus proceeding to RT followed by antiestrogen therapy were reviewed.  The patient would like to undergo reexcision and this will be scheduled at a convenient date at [facility].  Approximately 30 minutes were spent reviewing options for management in light of her recent biopsy results.    Entered by Ledell Noss, CMA, acting as a scribe for Dr. Hervey Ard, MD.   The documentation recorded by the scribe accurately reflects the service I personally performed and the decisions made by me.   Robert Bellow, MD FACS

## 2020-10-21 ENCOUNTER — Other Ambulatory Visit: Payer: Self-pay

## 2020-10-21 ENCOUNTER — Encounter: Payer: Self-pay | Admitting: Oncology

## 2020-10-21 ENCOUNTER — Inpatient Hospital Stay: Payer: Medicare PPO | Attending: Oncology | Admitting: Oncology

## 2020-10-21 VITALS — BP 170/84 | HR 79 | Temp 97.7°F | Resp 18 | Wt 183.8 lb

## 2020-10-21 DIAGNOSIS — Z170 Estrogen receptor positive status [ER+]: Secondary | ICD-10-CM | POA: Insufficient documentation

## 2020-10-21 DIAGNOSIS — D509 Iron deficiency anemia, unspecified: Secondary | ICD-10-CM | POA: Diagnosis not present

## 2020-10-21 DIAGNOSIS — D0512 Intraductal carcinoma in situ of left breast: Secondary | ICD-10-CM | POA: Insufficient documentation

## 2020-10-21 DIAGNOSIS — Z780 Asymptomatic menopausal state: Secondary | ICD-10-CM | POA: Diagnosis not present

## 2020-10-21 DIAGNOSIS — I10 Essential (primary) hypertension: Secondary | ICD-10-CM | POA: Diagnosis not present

## 2020-10-21 NOTE — Progress Notes (Signed)
Hematology/Oncology follow up  note [facility] [phone] [phone]   Patient Care Team: Jerrol Banana., MD as PCP - General (Family Medicine) Dingeldein, Remo Lipps, MD as Consulting Physician (Ophthalmology) Gae Dry, MD as Referring Physician (Obstetrics and Gynecology) Earlie Server, MD as Consulting Physician (Oncology) Thomes Dinning, MD as Referring Physician (Internal Medicine)  REFERRING PROVIDER: Nilsa Nutting REASON FOR VISIT Evaluation of anemia  HISTORY OF PRESENTING ILLNESS:  Claire Kane is a  80 y.o.  female with PMH listed below who was referred to me for evaluation of anemia.  Patient reports she was told that she had iron deficiency 2 years ago and she took iron supplementation for a while and was told to stop. Recently found to to have worsening of anemia. She had Upper and lower endoscopy in 2017 by Dr.Sankar # Upper endoscopy 12/07/2017 Normal examined duodenum. - Large hiatal hernia. - Erythematous mucosa in the antrum. Biopsied. - The examination was otherwise normal # Colonoscopy on 12/07/2017  Non-thrombosed external hemorrhoids found on perianal exam. - Diverticulosis in the sigmoid colon and in the descending colon. - The examination was otherwise normal on direct and retroflexion views. - No specimens collected. #  capsule study which did not reveal any active bleeding source.  # Remote history of DCIS right breast in  2002, s/p surgery and RT. Finished 5 years of tamoxifen.   # April - May s/p upper and lower endoscopy, capsule study which did not reveal active bleeding source  Celiac panel positive.  # iron deficiency anemia.  Previously she has received IV Venofer. Patient has previously underwent extensive gastroenterology work-up including EGD, colonoscopy, capsule study which did not reveal active bleeding source.  #08/18/2020, screening mammogram showed left breast  calcification. 09/07/2020, diagnostic left mammogram showed an area of 1.4 cm group of calcifications in the upper outer left breast.  Patient underwent breast biopsy, pathology came back positive for high-grade DCIS, focal necrosis, associated with calcifications.  ER status deferred to existing specimen.  Interval History 80 year old female with pertinent hematology history listed above reviewed by me today presents to reestablish care for left breast DCIS.    #10/07/2020, status post left DCIS lumpectomy by Dr. Bary Castilla Pathology showed residual high-grade DCIS with associated calcifications.  Extent of DCIS at least 12 mm, grade 3, central necrosis, anterior and inferior margins were unifocal in positive for DCIS pTis, ER positive.  Patient has followed up with surgery yesterday and Dr. Bary Castilla discussed with her about reexcision which patient agreed to.  She has surgery scheduled on 11/09/2020 Today patient has no new complaints.  Denies any lumpectomy site issues.   Review of Systems  Constitutional: Positive for malaise/fatigue. Negative for chills, diaphoresis, fever and weight loss.  HENT: Negative for ear discharge, nosebleeds and sore throat.   Eyes: Negative for double vision, photophobia, discharge and redness.  Respiratory: Negative for cough, hemoptysis, sputum production, shortness of breath and wheezing.   Cardiovascular: Negative for chest pain, palpitations, orthopnea and claudication.  Gastrointestinal: Negative for abdominal pain, blood in stool, constipation, heartburn, nausea and vomiting.  Genitourinary: Negative for dysuria and urgency.  Musculoskeletal: Negative for back pain, myalgias and neck pain.  Skin: Negative for itching and rash.  Neurological: Negative for dizziness, tingling, tremors and sensory change.  Endo/Heme/Allergies: Negative for environmental allergies. Does not bruise/bleed easily.  Psychiatric/Behavioral: Negative for depression, substance abuse  and suicidal ideas.    MEDICAL HISTORY:  Past Medical History:  Diagnosis Date  .  Anemia    fe deficiency now improved  . Anxiety   . Arthritis   . 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 Breast cancer (HCC) 2002   DCIS right breast   . Cataract   . GERD (gastroesophageal reflux disease)   . Heart murmur 2001  . Hiatal hernia   . High cholesterol   . Hypertension    patient denies anyone telling her that she has htn  . Personal history of radiation therapy 2002   BREAST CA  . Rosacea   . 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 complications     SURGICAL HISTORY: Past Surgical History:  Procedure Laterality Date  . ABDOMINAL HYSTERECTOMY    . BLEPHAROPLASTY    . BREAST BIOPSY Left 09/15/2020   affirm bx, x marker, DCIS  . BREAST CYST ASPIRATION Left 2003  . BREAST EXCISIONAL BIOPSY Right 2002   POS  . BREAST LUMPECTOMY Right 2002   w/ radiation  . BREAST LUMPECTOMY Left 10/07/2020  . BREAST LUMPECTOMY WITH NEEDLE LOCALIZATION Left 10/07/2020   Procedure: BREAST LUMPECTOMY WITH NEEDLE LOCALIZATION;  Surgeon: Earline Mayotte, MD;  Location: [facility] ORS;  Service: General;  Laterality: Left;  . BREAST SURGERY Right 2002   lumpectomy  . CHOLECYSTECTOMY    . COLONOSCOPY  2013  . COLONOSCOPY WITH PROPOFOL N/A 12/08/2015   Procedure: COLONOSCOPY WITH PROPOFOL;  Surgeon: Kieth Brightly, MD;  Location: [facility] ENDOSCOPY;  Service: Endoscopy;  Laterality: N/A;  . COLONOSCOPY WITH PROPOFOL N/A 12/06/2017   Procedure: COLONOSCOPY WITH PROPOFOL;  Surgeon: Wyline Mood, MD;  Location: [facility] ENDOSCOPY;  Service: Gastroenterology;  Laterality: N/A;  . COLONOSCOPY WITH PROPOFOL N/A 12/22/2017   Procedure: COLONOSCOPY WITH PROPOFOL;  Surgeon: Wyline Mood, MD;  Location: [facility] ENDOSCOPY;  Service: Gastroenterology;  Laterality: N/A;  . ESOPHAGOGASTRODUODENOSCOPY (EGD) WITH PROPOFOL N/A 12/08/2015   Procedure: ESOPHAGOGASTRODUODENOSCOPY (EGD) WITH PROPOFOL;  Surgeon:  Kieth Brightly, MD;  Location: [facility] ENDOSCOPY;  Service: Endoscopy;  Laterality: N/A;  . ESOPHAGOGASTRODUODENOSCOPY (EGD) WITH PROPOFOL N/A 12/06/2017   Procedure: ESOPHAGOGASTRODUODENOSCOPY (EGD) WITH PROPOFOL;  Surgeon: Wyline Mood, MD;  Location: [facility] ENDOSCOPY;  Service: Gastroenterology;  Laterality: N/A;  . ESOPHAGOGASTRODUODENOSCOPY (EGD) WITH PROPOFOL N/A 12/22/2017   Procedure: ESOPHAGOGASTRODUODENOSCOPY (EGD) WITH PROPOFOL;  Surgeon: Wyline Mood, MD;  Location: [facility] ENDOSCOPY;  Service: Gastroenterology;  Laterality: N/A;  . EYE SURGERY Right 2014  . EYE SURGERY Left 2014  . GIVENS CAPSULE STUDY N/A 01/23/2018   Procedure: GIVENS CAPSULE STUDY;  Surgeon: Wyline Mood, MD;  Location: [facility] ENDOSCOPY;  Service: Gastroenterology;  Laterality: N/A;  . iron infusion     had 5 iron infusions for anemia  . post radiation therapy Right    right breast cancer 2002  . Stab pheblectomy   2010  . vein closure procedure Bilateral 2008    SOCIAL HISTORY: Social History   Socioeconomic History  . Marital status: Married    Spouse name: larry  . Number of children: 2  . Years of education: Not on file  . Highest education level: Bachelor's degree (e.g., BA, AB, BS)  Occupational History  . Occupation: Runner, broadcasting/film/video    Comment: retired  Tobacco Use  . Smoking status: Never Smoker  . Smokeless tobacco: Never Used  Vaping Use  . Vaping Use: Never used  Substance and Sexual Activity  . Alcohol use: No  . Drug use: No  . Sexual activity: Never  Other Topics Concern  . Not on file  Social History  Narrative   Patient lives with husband. She feels safe in her home.   Social Determinants of Health   Financial Resource Strain: Not on file  Food Insecurity: Not on file  Transportation Needs: Not on file  Physical Activity: Not on file  Stress: Not on file  Social Connections: Not on file  Intimate Partner Violence: Not on file    FAMILY HISTORY: Family History  Problem Relation Age  of Onset  . Heart attack Mother   . Heart attack Father   . Heart attack Sister   . Stroke Sister   . Heart attack Brother   . Other Brother        cerebral hemorrhage, sepsis  . Breast cancer Cousin 67       breast/maternal  . Cancer Cousin 10       breast/maternal  . Breast cancer Cousin   . Breast cancer Maternal Aunt 70  . Breast cancer Other 48       maternal neice    ALLERGIES:  has No Known Allergies.  MEDICATIONS:  Current Outpatient Medications  Medication Sig Dispense Refill  . atorvastatin (LIPITOR) 10 MG tablet Take 1 tablet (10 mg total) by mouth daily. 90 tablet 3  . calcium citrate (CALCITRATE - DOSED IN MG ELEMENTAL CALCIUM) 950 MG tablet Take 200 mg of elemental calcium by mouth 2 (two) times daily.     Marland Kitchen docusate sodium (COLACE) 100 MG capsule Take 100 mg by mouth daily as needed for mild constipation.    Marland Kitchen ezetimibe (ZETIA) 10 MG tablet Take 10 mg by mouth daily.    . Glucosamine 750 MG TABS Take 750 mg by mouth daily.    . Glycerin-Polysorbate 80 (REFRESH DRY EYE THERAPY OP) Place 1 drop into both eyes 2 (two) times daily.    . Multiple Vitamins-Minerals (MULTIVITAMIN WITH MINERALS) tablet Take 1 tablet by mouth daily.    Marland Kitchen omeprazole (PRILOSEC) 20 MG capsule Take 1 capsule (20 mg total) by mouth daily. 90 capsule 3  . psyllium (REGULOID) 0.52 G capsule Take 2 capsules by mouth daily.    . SOOLANTRA 1 % CREA Apply 1 application topically daily as needed (rosacea).    Marland Kitchen spironolactone (ALDACTONE) 50 MG tablet Take 50 mg by mouth daily.    Marland Kitchen tolterodine (DETROL LA) 2 MG 24 hr capsule Take 1 capsule (2 mg total) by mouth daily. 30 capsule 11  . traMADol (ULTRAM) 50 MG tablet Take 1 tablet (50 mg total) by mouth every 6 (six) hours as needed. (Patient taking differently: Take 50 mg by mouth every 6 (six) hours as needed for severe pain.) 10 tablet 0  . ferrous sulfate 325 (65 FE) MG EC tablet Take 1 tablet (325 mg total) by mouth 2 (two) times daily with a meal.  (Patient not taking: Reported on 10/21/2020) 180 tablet 1   No current facility-administered medications for this visit.     PHYSICAL EXAMINATION: ECOG PERFORMANCE STATUS: 0 - Asymptomatic Vitals:   10/21/20 1421  BP: (!) 170/84  Pulse: 79  Resp: 18  Temp: 97.7 F (36.5 C)   Filed Weights   10/21/20 1421  Weight: 183 lb 12.8 oz (83.4 kg)    Physical Exam Constitutional:      General: She is not in acute distress.    Appearance: She is not diaphoretic.     Comments: Obese  HENT:     Head: Normocephalic and atraumatic.     Nose: Nose normal.  Mouth/Throat:     Pharynx: No oropharyngeal exudate.  Eyes:     General: No scleral icterus.       Right eye: No discharge.        Left eye: No discharge.     Conjunctiva/sclera: Conjunctivae normal.     Pupils: Pupils are equal, round, and reactive to light.  Cardiovascular:     Rate and Rhythm: Normal rate and regular rhythm.     Heart sounds: Normal heart sounds. No murmur heard.   Pulmonary:     Effort: Pulmonary effort is normal. No respiratory distress.     Breath sounds: Normal breath sounds. No wheezing or rales.  Chest:     Chest wall: No tenderness.  Abdominal:     General: Bowel sounds are normal. There is no distension.     Palpations: Abdomen is soft. There is no mass.     Tenderness: There is no abdominal tenderness. There is no guarding.  Musculoskeletal:        General: Normal range of motion.     Cervical back: Normal range of motion and neck supple.  Lymphadenopathy:     Cervical: No cervical adenopathy.  Skin:    General: Skin is warm and dry.     Findings: No erythema.  Neurological:     Mental Status: She is alert and oriented to person, place, and time.     Cranial Nerves: No cranial nerve deficit.     Motor: No abnormal muscle tone.     Coordination: Coordination normal.  Psychiatric:        Mood and Affect: Affect normal.        Cognition and Memory: Memory normal.        Judgment:  Judgment normal.      LABORATORY DATA:  I have reviewed the data as listed Lab Results  Component Value Date   WBC 8.4 09/24/2020   HGB 13.2 09/24/2020   HCT 39.1 09/24/2020   MCV 90.1 09/24/2020   PLT 328 09/24/2020   Recent Labs    06/18/20 0929 09/24/20 1426 09/30/20 1132  NA 146*  --  140  K 4.6  --  3.9  CL 105  --  104  CO2 24  --  26  GLUCOSE 105*  --  100*  BUN 10  --  13  CREATININE 0.82  --  0.80  CALCIUM 8.9  --  9.0  GFRNONAA 68  --  >60  GFRAA 79  --   --   PROT 6.9 7.0  --   ALBUMIN 4.5 4.2  --   AST 17 17  --   ALT 18 15  --   ALKPHOS 75 56  --   BILITOT 1.8* 1.1  --   BILIDIR  --  0.2  --   IBILI  --  0.9  --     Iron/TIBC/Ferritin/ %Sat    Component Value Date/Time   IRON 73 09/24/2020 1426   IRON 184 (H) 06/18/2020 0929   TIBC 344 09/24/2020 1426   TIBC 314 06/10/2019 1029   FERRITIN 34 09/24/2020 1426   FERRITIN 109 06/10/2019 1029   IRONPCTSAT 21 09/24/2020 1426   IRONPCTSAT 37 06/10/2019 1029     ASSESSMENT & PLAN:  Patient is a 80 year old female present for left breast DCIS 1. Ductal carcinoma in situ (DCIS) of left breast   2. Postmenopausal     #Left breast DCIS high grade. pTis, ER positive Status post left lumpectomy. Positive  margin and the patient agreed with reexcision, scheduled on 11/09/2020. Assuming reexcision margin is negative, I recommend patient to proceed with adjuvant radiation. Refer patient to establish care with radiation oncology.  I discussed about the role of antiestrogen treatment.  Patient is postmenopausal, aromatase inhibitor versus tamoxifen options were discussed with patient.  I will obtain baseline bone density.  Recommend calcium and vitamin D supplementation. All questions were answered. The patient knows to call the clinic with any problems questions or concerns.  Return of visit: 2 weeks after she finishes radiation.  Earlie Server, MD, PhD Hematology Oncology [facility] at  [facility] Pager- [phone] 10/21/2020

## 2020-10-21 NOTE — Progress Notes (Signed)
Patient denies new problems/concerns today.   °

## 2020-10-28 ENCOUNTER — Other Ambulatory Visit: Payer: Self-pay

## 2020-10-28 ENCOUNTER — Ambulatory Visit
Admission: RE | Admit: 2020-10-28 | Discharge: 2020-10-28 | Disposition: A | Discharge status: Home / Self Care | Payer: Medicare PPO | Source: Ambulatory Visit | Attending: Oncology | Admitting: Oncology

## 2020-10-28 DIAGNOSIS — Z13820 Encounter for screening for osteoporosis: Secondary | ICD-10-CM | POA: Diagnosis not present

## 2020-10-28 DIAGNOSIS — Z923 Personal history of irradiation: Secondary | ICD-10-CM | POA: Diagnosis not present

## 2020-10-28 DIAGNOSIS — D0512 Intraductal carcinoma in situ of left breast: Secondary | ICD-10-CM | POA: Diagnosis not present

## 2020-10-28 DIAGNOSIS — Z780 Asymptomatic menopausal state: Secondary | ICD-10-CM | POA: Diagnosis not present

## 2020-10-28 DIAGNOSIS — Z0389 Encounter for observation for other suspected diseases and conditions ruled out: Secondary | ICD-10-CM | POA: Diagnosis not present

## 2020-10-29 ENCOUNTER — Encounter
Admission: RE | Admit: 2020-10-29 | Discharge: 2020-10-29 | Disposition: A | Discharge status: Home / Self Care | Payer: Medicare PPO | Source: Ambulatory Visit | Attending: General Surgery | Admitting: General Surgery

## 2020-10-29 NOTE — Patient Instructions (Addendum)
Your procedure is scheduled on: Monday, February 21 Report to the Registration Desk on the 1st floor of the [facility]. To find out your arrival time, please call [phone] between 1PM - 3PM on: Friday, February 18  REMEMBER: Instructions that are not followed completely may result in serious medical risk, up to and including death; or upon the discretion of your surgeon and anesthesiologist your surgery may need to be rescheduled.  Do not eat food after midnight the night before surgery.  No gum chewing, lozengers or hard candies.  You may however, drink CLEAR liquids up to 2 hours before you are scheduled to arrive for your surgery. Do not drink anything within 2 hours of your scheduled arrival time.  Clear liquids include: - water  - apple juice without pulp - gatorade (not RED, PURPLE, OR BLUE) - black coffee or tea (Do NOT add milk or creamers to the coffee or tea) Do NOT drink anything that is not on this list.  TAKE THESE MEDICATIONS THE MORNING OF SURGERY WITH A SIP OF WATER:  1.  Omeprazole - (take one the night before and one on the morning of surgery - helps to prevent nausea after surgery.) 2.  Tramadol if needed 3.  Tolterodine  One week prior to surgery: starting February 14 Stop Anti-inflammatories (NSAIDS) such as Advil, Aleve, Ibuprofen, Motrin, Naproxen, Naprosyn and Aspirin based products such as Excedrin, Goodys Powder, BC Powder. Stop ANY OVER THE COUNTER supplements until after surgery.  No Alcohol for 24 hours before or after surgery.  On the morning of surgery brush your teeth with toothpaste and water, you may rinse your mouth with mouthwash if you wish. Do not swallow any toothpaste or mouthwash.  Do not wear jewelry, make-up, hairpins, clips or nail polish.  Do not wear lotions, powders, or perfumes.   Do not shave body from the neck down 48 hours prior to surgery just in case you cut yourself which could leave a site for infection.  Also,  freshly shaved skin may become irritated if using the CHG soap.  Contact lenses, hearing aids and dentures may not be worn into surgery.  Do not bring valuables to the hospital. [facility] is not responsible for any missing/lost belongings or valuables.   Use CHG Soap as directed on instruction sheet.  Notify your doctor if there is any change in your medical condition (cold, fever, infection).  Wear comfortable clothing (specific to your surgery type) to the hospital.  Plan for stool softeners for home use; pain medications have a tendency to cause constipation. You can also help prevent constipation by eating foods high in fiber such as fruits and vegetables and drinking plenty of fluids as your diet allows.  After surgery, you can help prevent lung complications by doing breathing exercises.  Take deep breaths and cough every 1-2 hours. Your doctor may order a device called an Incentive Spirometer to help you take deep breaths.  If you are being discharged the day of surgery, you will not be allowed to drive home. You will need a responsible adult (18 years or older) to drive you home and stay with you that night.   If you are taking public transportation, you will need to have a responsible adult (18 years or older) with you. Please confirm with your physician that it is acceptable to use public transportation.   Please call the [facility] Dept. at [phone] if you have any questions about these instructions.  Visitation  Policy:  Patients undergoing a surgery or procedure may have one family member or support person with them as long as that person is not COVID-19 positive or experiencing its symptoms.  That person may remain in the waiting area during the procedure.

## 2020-11-06 ENCOUNTER — Other Ambulatory Visit: Payer: Self-pay

## 2020-11-06 ENCOUNTER — Other Ambulatory Visit
Admission: RE | Admit: 2020-11-06 | Discharge: 2020-11-06 | Disposition: A | Discharge status: Home / Self Care | Payer: Medicare PPO | Source: Ambulatory Visit | Attending: General Surgery | Admitting: General Surgery

## 2020-11-06 DIAGNOSIS — Z20822 Contact with and (suspected) exposure to covid-19: Secondary | ICD-10-CM | POA: Insufficient documentation

## 2020-11-06 DIAGNOSIS — Z01812 Encounter for preprocedural laboratory examination: Secondary | ICD-10-CM | POA: Insufficient documentation

## 2020-11-06 LAB — SARS CORONAVIRUS 2 (TAT 6-24 HRS): SARS Coronavirus 2: NEGATIVE

## 2020-11-08 MED ORDER — LACTATED RINGERS IV SOLN: INTRAVENOUS | Status: DC

## 2020-11-08 MED ORDER — ORAL CARE MOUTH RINSE: 15.0000 mL | Freq: Once | OROMUCOSAL | Status: AC

## 2020-11-08 MED ORDER — CHLORHEXIDINE GLUCONATE CLOTH 2 % EX PADS
6.0000 | MEDICATED_PAD | Freq: Once | CUTANEOUS | Status: AC
  Administered 2020-11-09: 6 via TOPICAL

## 2020-11-08 MED ORDER — CHLORHEXIDINE GLUCONATE CLOTH 2 % EX PADS: 6.0000 | MEDICATED_PAD | Freq: Once | CUTANEOUS | Status: DC

## 2020-11-08 MED ORDER — CHLORHEXIDINE GLUCONATE 0.12 % MT SOLN
15.0000 mL | Freq: Once | OROMUCOSAL | Status: AC
  Administered 2020-11-09: 15 mL via OROMUCOSAL

## 2020-11-09 ENCOUNTER — Encounter: Payer: Self-pay | Admitting: General Surgery

## 2020-11-09 ENCOUNTER — Ambulatory Visit
Admission: RE | Admit: 2020-11-09 | Discharge: 2020-11-09 | Disposition: A | Discharge status: Home / Self Care | Payer: Medicare PPO | Attending: General Surgery | Admitting: General Surgery

## 2020-11-09 ENCOUNTER — Encounter
Admission: RE | Disposition: A | Discharge status: Home / Self Care | Payer: Self-pay | Source: Home / Self Care | Attending: General Surgery

## 2020-11-09 ENCOUNTER — Ambulatory Visit: Payer: Medicare PPO | Admitting: Anesthesiology

## 2020-11-09 DIAGNOSIS — N6012 Diffuse cystic mastopathy of left breast: Secondary | ICD-10-CM | POA: Insufficient documentation

## 2020-11-09 DIAGNOSIS — Z79899 Other long term (current) drug therapy: Secondary | ICD-10-CM | POA: Diagnosis not present

## 2020-11-09 DIAGNOSIS — D0512 Intraductal carcinoma in situ of left breast: Secondary | ICD-10-CM | POA: Diagnosis not present

## 2020-11-09 HISTORY — PX: RE-EXCISION OF BREAST CANCER,SUPERIOR MARGINS: SHX6047

## 2020-11-09 SURGERY — RE-EXCISION OF BREAST CANCER,SUPERIOR MARGINS
Anesthesia: General | Laterality: Left

## 2020-11-09 MED ORDER — ACETAMINOPHEN 10 MG/ML IV SOLN
INTRAVENOUS | Status: DC | PRN
  Administered 2020-11-09: 1000 mg via INTRAVENOUS

## 2020-11-09 MED ORDER — PROPOFOL 10 MG/ML IV BOLUS
INTRAVENOUS | Status: DC | PRN
  Administered 2020-11-09: 200 mg via INTRAVENOUS

## 2020-11-09 MED ORDER — ONDANSETRON HCL 4 MG/2ML IJ SOLN: 4.0000 mg | Freq: Once | INTRAMUSCULAR | Status: DC | PRN

## 2020-11-09 MED ORDER — ACETAMINOPHEN 10 MG/ML IV SOLN
INTRAVENOUS | Status: AC
  Filled 2020-11-09: qty 100

## 2020-11-09 MED ORDER — ONDANSETRON HCL 4 MG/2ML IJ SOLN
INTRAMUSCULAR | Status: AC
  Filled 2020-11-09: qty 2

## 2020-11-09 MED ORDER — FENTANYL CITRATE (PF) 100 MCG/2ML IJ SOLN
INTRAMUSCULAR | Status: AC
  Filled 2020-11-09: qty 2

## 2020-11-09 MED ORDER — FENTANYL CITRATE (PF) 100 MCG/2ML IJ SOLN
INTRAMUSCULAR | Status: DC | PRN
  Administered 2020-11-09 (×2): 50 ug via INTRAVENOUS

## 2020-11-09 MED ORDER — ONDANSETRON HCL 4 MG/2ML IJ SOLN
INTRAMUSCULAR | Status: DC | PRN
  Administered 2020-11-09: 4 mg via INTRAVENOUS

## 2020-11-09 MED ORDER — DEXAMETHASONE SODIUM PHOSPHATE 10 MG/ML IJ SOLN
INTRAMUSCULAR | Status: AC
  Filled 2020-11-09: qty 1

## 2020-11-09 MED ORDER — DEXAMETHASONE SODIUM PHOSPHATE 10 MG/ML IJ SOLN
INTRAMUSCULAR | Status: DC | PRN
  Administered 2020-11-09: 10 mg via INTRAVENOUS

## 2020-11-09 MED ORDER — LIDOCAINE HCL (CARDIAC) PF 100 MG/5ML IV SOSY
PREFILLED_SYRINGE | INTRAVENOUS | Status: DC | PRN
  Administered 2020-11-09: 100 mg via INTRAVENOUS

## 2020-11-09 MED ORDER — KETOROLAC TROMETHAMINE 30 MG/ML IJ SOLN
INTRAMUSCULAR | Status: AC
  Filled 2020-11-09: qty 1

## 2020-11-09 MED ORDER — KETOROLAC TROMETHAMINE 30 MG/ML IJ SOLN
INTRAMUSCULAR | Status: DC | PRN
  Administered 2020-11-09: 15 mg via INTRAVENOUS

## 2020-11-09 MED ORDER — LIDOCAINE HCL (PF) 2 % IJ SOLN
INTRAMUSCULAR | Status: AC
  Filled 2020-11-09: qty 5

## 2020-11-09 MED ORDER — FENTANYL CITRATE (PF) 100 MCG/2ML IJ SOLN: 25.0000 ug | INTRAMUSCULAR | Status: DC | PRN

## 2020-11-09 MED ORDER — BUPIVACAINE-EPINEPHRINE (PF) 0.5% -1:200000 IJ SOLN
INTRAMUSCULAR | Status: DC | PRN
  Administered 2020-11-09: 30 mL

## 2020-11-09 MED ORDER — PROPOFOL 10 MG/ML IV BOLUS
INTRAVENOUS | Status: AC
  Filled 2020-11-09: qty 20

## 2020-11-09 SURGICAL SUPPLY — 42 items
APL PRP STRL LF DISP 70% ISPRP (MISCELLANEOUS) ×1
BINDER BREAST XLRG (GAUZE/BANDAGES/DRESSINGS) ×1 IMPLANT
BLADE SURG 15 STRL SS SAFETY (BLADE) ×4 IMPLANT
CHLORAPREP W/TINT 26 (MISCELLANEOUS) ×2 IMPLANT
CNTNR SPEC 2.5X3XGRAD LEK (MISCELLANEOUS) ×1
CONT SPEC 4OZ STER OR WHT (MISCELLANEOUS) ×1
CONT SPEC 4OZ STRL OR WHT (MISCELLANEOUS) ×1
CONTAINER SPEC 2.5X3XGRAD LEK (MISCELLANEOUS) IMPLANT
COVER PROBE FLX POLY STRL (MISCELLANEOUS) ×1 IMPLANT
COVER WAND RF STERILE (DRAPES) ×2 IMPLANT
DEVICE DUBIN SPECIMEN MAMMOGRA (MISCELLANEOUS) ×1 IMPLANT
DRAPE LAPAROTOMY 100X77 ABD (DRAPES) ×2 IMPLANT
DRSG GAUZE FLUFF 36X18 (GAUZE/BANDAGES/DRESSINGS) ×3 IMPLANT
DRSG TELFA 4X3 1S NADH ST (GAUZE/BANDAGES/DRESSINGS) ×2 IMPLANT
ELECT CAUTERY BLADE TIP 2.5 (TIP) ×2
ELECT REM PT RETURN 9FT ADLT (ELECTROSURGICAL) ×2
ELECTRODE CAUTERY BLDE TIP 2.5 (TIP) ×1 IMPLANT
ELECTRODE REM PT RTRN 9FT ADLT (ELECTROSURGICAL) ×1 IMPLANT
GLOVE INDICATOR 8.0 STRL GRN (GLOVE) ×3 IMPLANT
GLOVE SURG ENC MOIS LTX SZ7.5 (GLOVE) ×3 IMPLANT
GOWN STRL REUS W/ TWL LRG LVL3 (GOWN DISPOSABLE) ×2 IMPLANT
GOWN STRL REUS W/TWL LRG LVL3 (GOWN DISPOSABLE) ×4
KIT TURNOVER KIT A (KITS) ×2 IMPLANT
LABEL OR SOLS (LABEL) ×2 IMPLANT
MANIFOLD NEPTUNE II (INSTRUMENTS) ×2 IMPLANT
MARGIN MAP 10MM (MISCELLANEOUS) ×2 IMPLANT
NDL HYPO 25X1 1.5 SAFETY (NEEDLE) ×1 IMPLANT
NEEDLE HYPO 22GX1.5 SAFETY (NEEDLE) ×2 IMPLANT
NEEDLE HYPO 25X1 1.5 SAFETY (NEEDLE) ×2 IMPLANT
PACK BASIN MINOR ARMC (MISCELLANEOUS) ×2 IMPLANT
RETRACTOR RING XSMALL (MISCELLANEOUS) IMPLANT
RTRCTR WOUND ALEXIS 13CM XS SH (MISCELLANEOUS)
SHEARS HARMONIC 9CM CVD (BLADE) IMPLANT
STRIP CLOSURE SKIN 1/2X4 (GAUZE/BANDAGES/DRESSINGS) ×2 IMPLANT
SUT ETHILON 3-0 FS-10 30 BLK (SUTURE) ×2
SUT VIC AB 2-0 CT1 27 (SUTURE) ×4
SUT VIC AB 2-0 CT1 TAPERPNT 27 (SUTURE) ×2 IMPLANT
SUT VIC AB 4-0 FS2 27 (SUTURE) ×2 IMPLANT
SUTURE EHLN 3-0 FS-10 30 BLK (SUTURE) ×1 IMPLANT
SWABSTK COMLB BENZOIN TINCTURE (MISCELLANEOUS) ×2 IMPLANT
SYR 10ML LL (SYRINGE) ×2 IMPLANT
WATER STERILE IRR 1000ML POUR (IV SOLUTION) ×2 IMPLANT

## 2020-11-09 NOTE — Anesthesia Postprocedure Evaluation (Signed)
Anesthesia Post Note  Patient: Claire Kane  Procedure(s) Performed: RE-EXCISION OF BREAST CANCER,SUPERIOR MARGINS (Left )  Patient location during evaluation: PACU Anesthesia Type: General Level of consciousness: awake and alert and oriented Pain management: pain level controlled Vital Signs Assessment: post-procedure vital signs reviewed and stable Respiratory status: spontaneous breathing Cardiovascular status: blood pressure returned to baseline Anesthetic complications: no   No complications documented.   Last Vitals:  Vitals:   11/09/20 1400 11/09/20 1410  BP: (!) 143/71 (!) 155/66  Pulse: 81 82  Resp: 18 12  Temp:  (!) 36.2 C  SpO2: 96% 95%    Last Pain:  Vitals:   11/09/20 1410  TempSrc: Temporal  PainSc: 0-No pain                 Axtyn Woehler

## 2020-11-09 NOTE — Op Note (Signed)
Preoperative diagnosis: DCIS with positive margin, left upper outer quadrant.  Postoperative diagnosis: Same.  Operative procedure: Left breast wide excision with tissue transfer.  Operating surgeon: Hervey Ard, MD.  Anesthesia: General by LMA, Marcaine 0.5% with 1: 200,000 units of epinephrine.  Estimated blood loss: Less than 5 cc.  Clinical note: This 80 year old woman had DCIS involving the left breast.  In spite of a sizable volume removed at the time of original surgery her anterior and medial margins were either very close or positive.  She is felt to be a candidate for reexcision based on her excellent health.  Operative note: The patient had SCD stockings for DVT prevention.  She underwent general anesthesia without difficulty.  The left breast was cleansed with ChloraPrep and draped.  A parallelogram excision was made overlying the prior wide excision site in the upper outer quadrant.  This was approximately 2 cm long and 5 cm wide.  A block of skin and soft tissue was excised and then extended medially where the positive margin had been noted.  The tissue was removed en bloc to the pectoralis fascia.  This left about a 10 cm tissue defect.  The specimen had been orientated by the indication of M and L on the skin surface.  It was sent fresh to pathology for evaluation.  The breast tissue was elevated off the underlying pectoralis muscle circumferentially.  This was then approximated in 2 layers with interrupted 2-0 Vicryl figure-of-eight sutures to minimize dead space.  It was necessary to create a 6 mm flap to take tension off the skin and to provide smoother apposition.  This was done circumferentially for a distance of about 2 cm inferiorly and 3 cm superiorly.  This allowed smooth apposition of the skin with a running 4-0 Vicryl subcuticular suture.  Benzoin, Steri-Strips followed by Telfa, fluff gauze and a compressive wrap was applied.  The patient tolerated the procedure  well and was taken recovery in stable condition.

## 2020-11-09 NOTE — H&P (Addendum)
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  For re-excision.   Medications Prior to Admission  Medication Sig Dispense Refill Last Dose  . atorvastatin (LIPITOR) 10 MG tablet Take 1 tablet (10 mg total) by mouth daily. 90 tablet 3   . calcium citrate (CALCITRATE - DOSED IN MG ELEMENTAL CALCIUM) 950 MG tablet Take 200 mg of elemental calcium by mouth 2 (two) times daily.      Marland Kitchen docusate sodium (COLACE) 100 MG capsule Take 100 mg by mouth daily as needed for mild constipation.     Marland Kitchen ezetimibe (ZETIA) 10 MG tablet Take 10 mg by mouth daily.     . Glucosamine 750 MG TABS Take 750 mg by mouth daily.     . Glycerin-Polysorbate 80 (REFRESH DRY EYE THERAPY OP) Place 1 drop into both eyes 2 (two) times daily.     . Multiple Vitamins-Minerals (MULTIVITAMIN WITH MINERALS) tablet Take 1 tablet by mouth daily.     Marland Kitchen omeprazole (PRILOSEC) 20 MG capsule Take 1 capsule (20 mg total) by mouth daily. 90 capsule 3   . psyllium (REGULOID) 0.52 G capsule Take 2 capsules by mouth daily.     . SOOLANTRA 1 % CREA Apply 1 application topically daily as needed (rosacea).     Marland Kitchen spironolactone (ALDACTONE) 50 MG tablet Take 50 mg by mouth daily.     Marland Kitchen tolterodine (DETROL LA) 2 MG 24 hr capsule Take 1 capsule (2 mg total) by mouth daily. 30 capsule 11   . traMADol (ULTRAM) 50 MG tablet Take 1 tablet (50 mg total) by mouth every 6 (six) hours as needed. (Patient taking differently: Take 50 mg by mouth every 6 (six) hours as needed for severe pain.) 10 tablet 0   . ferrous sulfate 325 (65 FE) MG EC tablet Take 1 tablet (325 mg total) by mouth 2 (two) times daily with a meal. (Patient not taking: Reported on 10/21/2020) 180 tablet 1    No Known Allergies Past Medical History:  Diagnosis Date  . Anemia    fe deficiency now improved  . Anxiety   . Arthritis   . 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 Breast cancer ([place]) 09/2020   left breast  . Cataract   . GERD (gastroesophageal reflux disease)   . Heart murmur 2001  . Hiatal hernia   . High cholesterol   . Hypertension    patient denies anyone telling her that she has htn  . Personal history of radiation therapy 2002, 2022   BREAST CA  . Rosacea   . 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 complications    Past Surgical History:  Procedure Laterality Date  . ABDOMINAL HYSTERECTOMY    . BLEPHAROPLASTY    . BREAST BIOPSY Left 09/15/2020   affirm bx, x marker, DCIS  . BREAST CYST ASPIRATION Left 2003  . BREAST EXCISIONAL BIOPSY Right 2002   POS  . BREAST LUMPECTOMY Right 2002   w/ radiation  . BREAST LUMPECTOMY Left 10/07/2020  . BREAST LUMPECTOMY Left    re-excision  . BREAST LUMPECTOMY WITH NEEDLE LOCALIZATION Left 10/07/2020   Procedure: BREAST LUMPECTOMY WITH NEEDLE LOCALIZATION;  Surgeon: Robert Bellow, MD;  Location: [facility] ORS;  Service: General;  Laterality: Left;  . BREAST SURGERY Right 2002   lumpectomy  .  CATARACT EXTRACTION W/ INTRAOCULAR LENS  IMPLANT, BILATERAL    . CHOLECYSTECTOMY    . COLONOSCOPY  2013  . COLONOSCOPY WITH PROPOFOL N/A 12/08/2015   Procedure: COLONOSCOPY WITH PROPOFOL;  Surgeon: Christene Lye, MD;  Location: [facility] ENDOSCOPY;  Service: Endoscopy;  Laterality: N/A;  . COLONOSCOPY WITH PROPOFOL N/A 12/06/2017   Procedure: COLONOSCOPY WITH PROPOFOL;  Surgeon: Jonathon Bellows, MD;  Location: [facility] ENDOSCOPY;  Service: Gastroenterology;  Laterality: N/A;  . COLONOSCOPY WITH PROPOFOL N/A 12/22/2017   Procedure: COLONOSCOPY WITH PROPOFOL;  Surgeon: Jonathon Bellows, MD;  Location: [facility] ENDOSCOPY;  Service: Gastroenterology;  Laterality: N/A;  . ESOPHAGOGASTRODUODENOSCOPY (EGD) WITH PROPOFOL N/A 12/08/2015   Procedure: ESOPHAGOGASTRODUODENOSCOPY (EGD) WITH PROPOFOL;  Surgeon: Christene Lye, MD;  Location: [facility] ENDOSCOPY;  Service: Endoscopy;  Laterality: N/A;  .  ESOPHAGOGASTRODUODENOSCOPY (EGD) WITH PROPOFOL N/A 12/06/2017   Procedure: ESOPHAGOGASTRODUODENOSCOPY (EGD) WITH PROPOFOL;  Surgeon: Jonathon Bellows, MD;  Location: [facility] ENDOSCOPY;  Service: Gastroenterology;  Laterality: N/A;  . ESOPHAGOGASTRODUODENOSCOPY (EGD) WITH PROPOFOL N/A 12/22/2017   Procedure: ESOPHAGOGASTRODUODENOSCOPY (EGD) WITH PROPOFOL;  Surgeon: Jonathon Bellows, MD;  Location: [facility] ENDOSCOPY;  Service: Gastroenterology;  Laterality: N/A;  . EYE SURGERY Right 2014  . EYE SURGERY Left 2014  . GIVENS CAPSULE STUDY N/A 01/23/2018   Procedure: GIVENS CAPSULE STUDY;  Surgeon: Jonathon Bellows, MD;  Location: [facility] ENDOSCOPY;  Service: Gastroenterology;  Laterality: N/A;  . iron infusion     had 5 iron infusions for anemia  . post radiation therapy Right    right breast cancer 2002  . Stab pheblectomy   2010  . vein closure procedure Bilateral 2008   Social History   Socioeconomic History  . Marital status: Married    Spouse name: Fritz Pickerel  . Number of children: 2  . Years of education: Not on file  . Highest education level: Bachelor's degree (e.g., BA, AB, BS)  Occupational History  . Occupation: Pharmacist, hospital    Comment: retired  Tobacco Use  . Smoking status: Never Smoker  . Smokeless tobacco: Never Used  Vaping Use  . Vaping Use: Never used  Substance and Sexual Activity  . Alcohol use: No  . Drug use: No  . Sexual activity: Never  Other Topics Concern  . Not on file  Social History Narrative   Patient lives with husband. She feels safe in her home.   Social Determinants of Health   Financial Resource Strain: Not on file  Food Insecurity: Not on file  Transportation Needs: Not on file  Physical Activity: Not on file  Stress: Not on file  Social Connections: Not on file  Intimate Partner Violence: Not on file   Social History   Social History Narrative   Patient lives with husband. She feels safe in her home.     ROS: Negative.   Residual DCIS is present in the tissue  around the biopsy site and is  close to the anterior and inferior marginsin several sections. DCIS is  focally present at the anterior and inferior margins. Cauterized tissue  suspicious for cauterized DCIS is present focally at the medial margin.  DCIS is focally present 1.4 mm tothe superior margin.   PE: HEENT: Negative. Lungs: Clear. Cardio: RR.   Assessment/Plan:  Proceed with planned re-excision of the left breast.    [facility] 11/09/2020

## 2020-11-09 NOTE — Anesthesia Procedure Notes (Signed)
Procedure Name: LMA Insertion Date/Time: 11/09/2020 12:17 PM Performed by: Johnna Acosta, CRNA Pre-anesthesia Checklist: Patient identified, Emergency Drugs available, Suction available, Patient being monitored and Timeout performed Patient Re-evaluated:Patient Re-evaluated prior to induction Preoxygenation: Pre-oxygenation with 100% oxygen Induction Type: IV induction Ventilation: Mask ventilation without difficulty LMA: LMA inserted LMA Size: 3.0 Tube type: Oral Number of attempts: 3 Placement Confirmation: positive ETCO2 and breath sounds checked- equal and bilateral Tube secured with: Tape Dental Injury: Teeth and Oropharynx as per pre-operative assessment and Bloody posterior oropharynx  Difficulty Due To: Difficulty was anticipated and Difficult Airway- due to limited oral opening Comments: 1 attempt by self with 4 LMA Dr Kayleen Memos attempted 2x with ERQ4. Mask ventilation Lma3 inserted x1 attempt.

## 2020-11-09 NOTE — Anesthesia Preprocedure Evaluation (Addendum)
Anesthesia Evaluation  Patient identified by MRN, date of birth, ID band Patient awake    Reviewed: Allergy & Precautions, H&P , NPO status , Patient's Chart, lab work & pertinent test results  History of Anesthesia Complications Negative for: history of anesthetic complications  Airway Mallampati: III  TM Distance: <3 FB     Dental   Pulmonary neg pulmonary ROS, neg sleep apnea, neg COPD,    Pulmonary exam normal        Cardiovascular hypertension, (-) angina(-) Past MI and (-) Cardiac Stents Normal cardiovascular exam(-) dysrhythmias      Neuro/Psych PSYCHIATRIC DISORDERS Anxiety Depression negative neurological ROS     GI/Hepatic Neg liver ROS, hiatal hernia, GERD  ,  Endo/Other  negative endocrine ROS  Renal/GU      Musculoskeletal  (+) Arthritis ,   Abdominal Normal abdominal exam  (+)   Peds  Hematology negative hematology ROS (+)   Anesthesia Other Findings Obesity  Past Medical History: No date: Anemia     Comment:  fe deficiency now improved No date: Anxiety No date: Arthritis 08/04/2015: BRCA negative     Comment:  BRCA1 and BRCA2 2002: Breast cancer ([place])     Comment:  DCIS right breast  09/2020: Breast cancer ([place])     Comment:  left breast No date: Cataract No date: GERD (gastroesophageal reflux disease) 2001: Heart murmur No date: Hiatal hernia No date: High cholesterol No date: Hypertension     Comment:  patient denies anyone telling her that she has htn 2002, 2022: Personal history of radiation therapy     Comment:  BREAST CA No date: Rosacea No date: 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 complications  Past Surgical History: No date: ABDOMINAL HYSTERECTOMY No date: BLEPHAROPLASTY 09/15/2020: BREAST BIOPSY; Left     Comment:  affirm bx, x marker, DCIS 2003: BREAST CYST ASPIRATION; Left 2002: BREAST EXCISIONAL BIOPSY; Right     Comment:  POS 2002: BREAST LUMPECTOMY;  Right     Comment:  w/ radiation 10/07/2020: BREAST LUMPECTOMY; Left No date: BREAST LUMPECTOMY; Left     Comment:  re-excision 10/07/2020: BREAST LUMPECTOMY WITH NEEDLE LOCALIZATION; Left     Comment:  Procedure: BREAST LUMPECTOMY WITH NEEDLE LOCALIZATION;                Surgeon: Robert Bellow, MD;  Location: [facility] ORS;                Service: General;  Laterality: Left; 2002: BREAST SURGERY; Right     Comment:  lumpectomy No date: CATARACT EXTRACTION W/ INTRAOCULAR LENS  IMPLANT, BILATERAL No date: CHOLECYSTECTOMY 2013: COLONOSCOPY 12/08/2015: COLONOSCOPY WITH PROPOFOL; N/A     Comment:  Procedure: COLONOSCOPY WITH PROPOFOL;  Surgeon:               Christene Lye, MD;  Location: [facility] ENDOSCOPY;                Service: Endoscopy;  Laterality: N/A; 12/06/2017: COLONOSCOPY WITH PROPOFOL; N/A     Comment:  Procedure: COLONOSCOPY WITH PROPOFOL;  Surgeon: Jonathon Bellows, MD;  Location: [facility] ENDOSCOPY;  Service:               Gastroenterology;  Laterality: N/A; 12/22/2017: COLONOSCOPY WITH PROPOFOL; N/A     Comment:  Procedure: COLONOSCOPY WITH PROPOFOL;  Surgeon: Vicente Males,  Bailey Mech, MD;  Location: [place];  Service:               Gastroenterology;  Laterality: N/A; 12/08/2015: ESOPHAGOGASTRODUODENOSCOPY (EGD) WITH PROPOFOL; N/A     Comment:  Procedure: ESOPHAGOGASTRODUODENOSCOPY (EGD) WITH               PROPOFOL;  Surgeon: Christene Lye, MD;  Location:              [facility] ENDOSCOPY;  Service: Endoscopy;  Laterality: N/A; 12/06/2017: ESOPHAGOGASTRODUODENOSCOPY (EGD) WITH PROPOFOL; N/A     Comment:  Procedure: ESOPHAGOGASTRODUODENOSCOPY (EGD) WITH               PROPOFOL;  Surgeon: Jonathon Bellows, MD;  Location: [facility]               ENDOSCOPY;  Service: Gastroenterology;  Laterality: N/A; 12/22/2017: ESOPHAGOGASTRODUODENOSCOPY (EGD) WITH PROPOFOL; N/A     Comment:  Procedure: ESOPHAGOGASTRODUODENOSCOPY (EGD) WITH               PROPOFOL;  Surgeon: Jonathon Bellows,  MD;  Location: [facility]               ENDOSCOPY;  Service: Gastroenterology;  Laterality: N/A; 2014: EYE SURGERY; Right 2014: EYE SURGERY; Left 01/23/2018: GIVENS CAPSULE STUDY; N/A     Comment:  Procedure: GIVENS CAPSULE STUDY;  Surgeon: Jonathon Bellows,               MD;  Location: [facility] ENDOSCOPY;  Service:               Gastroenterology;  Laterality: N/A; No date: iron infusion     Comment:  had 5 iron infusions for anemia No date: post radiation therapy; Right     Comment:  right breast cancer 2002 2010: Stab pheblectomy  2008: vein closure procedure; Bilateral     Reproductive/Obstetrics negative OB ROS                                                             Anesthesia Evaluation  Patient identified by MRN, date of birth, ID band Patient awake    Reviewed: Allergy & Precautions, H&P , NPO status , Patient's Chart, lab work & pertinent test results  History of Anesthesia Complications Negative for: history of anesthetic complications  Airway Mallampati: III  TM Distance: <3 FB Neck ROM: full    Dental  (+) Chipped, Poor Dentition, Missing   Pulmonary neg shortness of breath,    Pulmonary exam normal        Cardiovascular Exercise Tolerance: Good hypertension, (-) angina(-) Past MI Normal cardiovascular exam     Neuro/Psych PSYCHIATRIC DISORDERS negative neurological ROS     GI/Hepatic Neg liver ROS, hiatal hernia, GERD  Medicated and Controlled,  Endo/Other  negative endocrine ROS  Renal/GU      Musculoskeletal  (+) Arthritis ,   Abdominal   Peds  Hematology negative hematology ROS (+)   Anesthesia Other Findings Past Medical History: No date: Anemia     Comment:  fe deficiency now improved No date: Anxiety No date: Arthritis 08/04/2015: BRCA negative     Comment:  BRCA1 and BRCA2 2002: Breast cancer ([place])     Comment:  DCIS right breast  No date: Cataract No date: GERD (gastroesophageal reflux  disease) 2001:  Heart murmur No date: Hiatal hernia No date: High cholesterol No date: Hypertension     Comment:  patient denies anyone telling her that she has htn 2002: Personal history of radiation therapy     Comment:  BREAST CA No date: Rosacea No date: 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 complications  Past Surgical History: No date: ABDOMINAL HYSTERECTOMY No date: BLEPHAROPLASTY 09/15/2020: BREAST BIOPSY; Left     Comment:  affirm bx, x marker, DCIS 2003: BREAST CYST ASPIRATION; Left 2002: BREAST EXCISIONAL BIOPSY; Right     Comment:  POS 2002: BREAST LUMPECTOMY; Right     Comment:  w/ radiation 10/07/2020: BREAST LUMPECTOMY; Left 2002: BREAST SURGERY; Right     Comment:  lumpectomy No date: CHOLECYSTECTOMY 2013: COLONOSCOPY 12/08/2015: COLONOSCOPY WITH PROPOFOL; N/A     Comment:  Procedure: COLONOSCOPY WITH PROPOFOL;  Surgeon:               Christene Lye, MD;  Location: [facility] ENDOSCOPY;                Service: Endoscopy;  Laterality: N/A; 12/06/2017: COLONOSCOPY WITH PROPOFOL; N/A     Comment:  Procedure: COLONOSCOPY WITH PROPOFOL;  Surgeon: Jonathon Bellows, MD;  Location: [facility] ENDOSCOPY;  Service:               Gastroenterology;  Laterality: N/A; 12/22/2017: COLONOSCOPY WITH PROPOFOL; N/A     Comment:  Procedure: COLONOSCOPY WITH PROPOFOL;  Surgeon: Jonathon Bellows, MD;  Location: [facility] ENDOSCOPY;  Service:               Gastroenterology;  Laterality: N/A; 12/08/2015: ESOPHAGOGASTRODUODENOSCOPY (EGD) WITH PROPOFOL; N/A     Comment:  Procedure: ESOPHAGOGASTRODUODENOSCOPY (EGD) WITH               PROPOFOL;  Surgeon: Christene Lye, MD;  Location:              [facility] ENDOSCOPY;  Service: Endoscopy;  Laterality: N/A; 12/06/2017: ESOPHAGOGASTRODUODENOSCOPY (EGD) WITH PROPOFOL; N/A     Comment:  Procedure: ESOPHAGOGASTRODUODENOSCOPY (EGD) WITH               PROPOFOL;  Surgeon: Jonathon Bellows, MD;  Location: [facility]               ENDOSCOPY;   Service: Gastroenterology;  Laterality: N/A; 12/22/2017: ESOPHAGOGASTRODUODENOSCOPY (EGD) WITH PROPOFOL; N/A     Comment:  Procedure: ESOPHAGOGASTRODUODENOSCOPY (EGD) WITH               PROPOFOL;  Surgeon: Jonathon Bellows, MD;  Location: [facility]               ENDOSCOPY;  Service: Gastroenterology;  Laterality: N/A; 2014: EYE SURGERY; Right 2014: EYE SURGERY; Left 01/23/2018: GIVENS CAPSULE STUDY; N/A     Comment:  Procedure: GIVENS CAPSULE STUDY;  Surgeon: Jonathon Bellows,               MD;  Location: [facility] ENDOSCOPY;  Service:               Gastroenterology;  Laterality: N/A; No date: iron infusion     Comment:  had 5 iron infusions for anemia No date: post radiation therapy; Right     Comment:  right breast cancer 2002 2010: Stab pheblectomy  2008: vein closure procedure; Bilateral  BMI    Body Mass  Index: 32.09 kg/m      Reproductive/Obstetrics negative OB ROS                             Anesthesia Physical Anesthesia Plan  ASA: III  Anesthesia Plan: General LMA   Post-op Pain Management:    Induction: Intravenous  PONV Risk Score and Plan: Dexamethasone, Ondansetron, Midazolam and Treatment may vary due to age or medical condition  Airway Management Planned: LMA  Additional Equipment:   Intra-op Plan:   Post-operative Plan: Extubation in OR  Informed Consent: I have reviewed the patients History and Physical, chart, labs and discussed the procedure including the risks, benefits and alternatives for the proposed anesthesia with the patient or authorized representative who has indicated his/her understanding and acceptance.     Dental Advisory Given  Plan Discussed with: Anesthesiologist, CRNA and Surgeon  Anesthesia Plan Comments: (Patient consented for risks of anesthesia including but not limited to:  - adverse reactions to medications - damage to eyes, teeth, lips or other oral mucosa - nerve damage due to positioning  - sore throat or  hoarseness - Damage to heart, brain, nerves, lungs, other parts of body or loss of life  Patient voiced understanding.)        Anesthesia Quick Evaluation  Anesthesia Physical Anesthesia Plan  ASA: II  Anesthesia Plan: General LMA and General   Post-op Pain Management:    Induction:   PONV Risk Score and Plan: Dexamethasone, Ondansetron, Midazolam and Treatment may vary due to age or medical condition  Airway Management Planned: LMA  Additional Equipment:   Intra-op Plan:   Post-operative Plan: Extubation in OR  Informed Consent: I have reviewed the patients History and Physical, chart, labs and discussed the procedure including the risks, benefits and alternatives for the proposed anesthesia with the patient or authorized representative who has indicated his/her understanding and acceptance.     Dental Advisory Given  Plan Discussed with: Anesthesiologist, CRNA and Surgeon  Anesthesia Plan Comments:        Anesthesia Quick Evaluation

## 2020-11-09 NOTE — Transfer of Care (Signed)
Immediate Anesthesia Transfer of Care Note  Patient: Claire Kane  Procedure(s) Performed: RE-EXCISION OF BREAST CANCER,SUPERIOR MARGINS (Left )  Patient Location: PACU  Anesthesia Type:General  Level of Consciousness: drowsy  Airway & Oxygen Therapy: Patient Spontanous Breathing and Patient connected to face mask oxygen  Post-op Assessment: Report given to RN and Post -op Vital signs reviewed and stable  Post vital signs: Reviewed and stable  Last Vitals:  Vitals Value Taken Time  BP 149/72 11/09/20 1309  Temp    Pulse 85 11/09/20 1309  Resp 17 11/09/20 1309  SpO2 100 % 11/09/20 1309  Vitals shown include unvalidated device data.  Last Pain:  Vitals:   11/09/20 1151  PainSc: 0-No pain         Complications: No complications documented.

## 2020-11-09 NOTE — Discharge Instructions (Signed)

## 2020-11-10 ENCOUNTER — Encounter: Payer: Self-pay | Admitting: General Surgery

## 2020-11-12 LAB — SURGICAL PATHOLOGY

## 2020-11-16 ENCOUNTER — Ambulatory Visit
Admission: RE | Admit: 2020-11-16 | Discharge: 2020-11-16 | Disposition: A | Discharge status: Home / Self Care | Payer: Medicare PPO | Source: Ambulatory Visit | Attending: Radiation Oncology | Admitting: Radiation Oncology

## 2020-11-16 ENCOUNTER — Encounter: Payer: Self-pay | Admitting: Radiation Oncology

## 2020-11-16 ENCOUNTER — Other Ambulatory Visit: Payer: Self-pay

## 2020-11-16 VITALS — BP 161/85 | HR 74 | Temp 96.2°F | Resp 16 | Wt 179.5 lb

## 2020-11-16 DIAGNOSIS — K449 Diaphragmatic hernia without obstruction or gangrene: Secondary | ICD-10-CM | POA: Diagnosis not present

## 2020-11-16 DIAGNOSIS — Z79899 Other long term (current) drug therapy: Secondary | ICD-10-CM | POA: Diagnosis not present

## 2020-11-16 DIAGNOSIS — D0512 Intraductal carcinoma in situ of left breast: Secondary | ICD-10-CM | POA: Insufficient documentation

## 2020-11-16 DIAGNOSIS — F419 Anxiety disorder, unspecified: Secondary | ICD-10-CM | POA: Diagnosis not present

## 2020-11-16 DIAGNOSIS — E7800 Pure hypercholesterolemia, unspecified: Secondary | ICD-10-CM | POA: Diagnosis not present

## 2020-11-16 DIAGNOSIS — Z170 Estrogen receptor positive status [ER+]: Secondary | ICD-10-CM | POA: Insufficient documentation

## 2020-11-16 DIAGNOSIS — D649 Anemia, unspecified: Secondary | ICD-10-CM | POA: Insufficient documentation

## 2020-11-16 DIAGNOSIS — M129 Arthropathy, unspecified: Secondary | ICD-10-CM | POA: Diagnosis not present

## 2020-11-16 DIAGNOSIS — I10 Essential (primary) hypertension: Secondary | ICD-10-CM | POA: Diagnosis not present

## 2020-11-16 DIAGNOSIS — Z803 Family history of malignant neoplasm of breast: Secondary | ICD-10-CM | POA: Diagnosis not present

## 2020-11-16 DIAGNOSIS — R011 Cardiac murmur, unspecified: Secondary | ICD-10-CM | POA: Insufficient documentation

## 2020-11-16 DIAGNOSIS — Z809 Family history of malignant neoplasm, unspecified: Secondary | ICD-10-CM | POA: Insufficient documentation

## 2020-11-16 DIAGNOSIS — K219 Gastro-esophageal reflux disease without esophagitis: Secondary | ICD-10-CM | POA: Diagnosis not present
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
## 2020-11-18 ENCOUNTER — Ambulatory Visit: Payer: Medicare PPO

## 2020-11-26 ENCOUNTER — Other Ambulatory Visit: Payer: Self-pay | Admitting: *Deleted

## 2020-11-26 ENCOUNTER — Ambulatory Visit
Admission: RE | Admit: 2020-11-26 | Discharge: 2020-11-26 | Disposition: A | Discharge status: Home / Self Care | Payer: Medicare PPO | Source: Ambulatory Visit | Attending: Radiation Oncology | Admitting: Radiation Oncology

## 2020-11-26 DIAGNOSIS — D0512 Intraductal carcinoma in situ of left breast: Secondary | ICD-10-CM | POA: Insufficient documentation

## 2020-11-26 DIAGNOSIS — Z170 Estrogen receptor positive status [ER+]: Secondary | ICD-10-CM | POA: Diagnosis not present

## 2020-11-26 DIAGNOSIS — Z510 Encounter for antineoplastic radiation therapy: Secondary | ICD-10-CM | POA: Diagnosis not present

## 2020-11-30 DIAGNOSIS — Z510 Encounter for antineoplastic radiation therapy: Secondary | ICD-10-CM | POA: Diagnosis not present

## 2020-11-30 DIAGNOSIS — Z170 Estrogen receptor positive status [ER+]: Secondary | ICD-10-CM | POA: Diagnosis not present

## 2020-11-30 DIAGNOSIS — D0512 Intraductal carcinoma in situ of left breast: Secondary | ICD-10-CM | POA: Diagnosis not present

## 2020-12-03 ENCOUNTER — Ambulatory Visit: Admission: RE | Admit: 2020-12-03 | Payer: Medicare PPO | Source: Ambulatory Visit

## 2020-12-03 DIAGNOSIS — Z510 Encounter for antineoplastic radiation therapy: Secondary | ICD-10-CM | POA: Diagnosis not present

## 2020-12-03 DIAGNOSIS — Z170 Estrogen receptor positive status [ER+]: Secondary | ICD-10-CM | POA: Diagnosis not present

## 2020-12-03 DIAGNOSIS — D0512 Intraductal carcinoma in situ of left breast: Secondary | ICD-10-CM | POA: Diagnosis not present

## 2020-12-07 ENCOUNTER — Ambulatory Visit
Admission: RE | Admit: 2020-12-07 | Discharge: 2020-12-07 | Disposition: A | Discharge status: Home / Self Care | Payer: Medicare PPO | Source: Ambulatory Visit | Attending: Radiation Oncology | Admitting: Radiation Oncology

## 2020-12-07 DIAGNOSIS — D0512 Intraductal carcinoma in situ of left breast: Secondary | ICD-10-CM | POA: Diagnosis not present

## 2020-12-07 DIAGNOSIS — Z510 Encounter for antineoplastic radiation therapy: Secondary | ICD-10-CM | POA: Diagnosis not present

## 2020-12-07 DIAGNOSIS — Z170 Estrogen receptor positive status [ER+]: Secondary | ICD-10-CM | POA: Diagnosis not present

## 2020-12-08 ENCOUNTER — Ambulatory Visit: Payer: Medicare PPO

## 2020-12-09 ENCOUNTER — Ambulatory Visit
Admission: RE | Admit: 2020-12-09 | Discharge: 2020-12-09 | Disposition: A | Discharge status: Home / Self Care | Payer: Medicare PPO | Source: Ambulatory Visit | Attending: Radiation Oncology | Admitting: Radiation Oncology

## 2020-12-09 ENCOUNTER — Other Ambulatory Visit: Payer: Self-pay | Admitting: Licensed Clinical Social Worker

## 2020-12-09 DIAGNOSIS — D0512 Intraductal carcinoma in situ of left breast: Secondary | ICD-10-CM | POA: Diagnosis not present

## 2020-12-09 DIAGNOSIS — Z510 Encounter for antineoplastic radiation therapy: Secondary | ICD-10-CM | POA: Diagnosis not present

## 2020-12-09 DIAGNOSIS — R0781 Pleurodynia: Secondary | ICD-10-CM

## 2020-12-09 NOTE — Progress Notes (Signed)
Plain film

## 2020-12-10 ENCOUNTER — Ambulatory Visit
Admission: RE | Admit: 2020-12-10 | Discharge: 2020-12-10 | Disposition: A | Discharge status: Home / Self Care | Payer: Self-pay | Source: Ambulatory Visit | Attending: Internal Medicine | Admitting: Internal Medicine

## 2020-12-10 ENCOUNTER — Ambulatory Visit
Admission: RE | Admit: 2020-12-10 | Discharge: 2020-12-10 | Disposition: A | Discharge status: Home / Self Care | Payer: Self-pay | Attending: Radiation Oncology | Admitting: Radiation Oncology

## 2020-12-10 ENCOUNTER — Ambulatory Visit
Admission: RE | Admit: 2020-12-10 | Discharge: 2020-12-10 | Disposition: A | Discharge status: Home / Self Care | Payer: Medicare PPO | Source: Ambulatory Visit | Attending: Radiation Oncology | Admitting: Radiation Oncology

## 2020-12-10 DIAGNOSIS — R0781 Pleurodynia: Secondary | ICD-10-CM | POA: Insufficient documentation

## 2020-12-10 DIAGNOSIS — D0512 Intraductal carcinoma in situ of left breast: Secondary | ICD-10-CM | POA: Diagnosis not present

## 2020-12-10 DIAGNOSIS — K449 Diaphragmatic hernia without obstruction or gangrene: Secondary | ICD-10-CM | POA: Diagnosis not present

## 2020-12-10 DIAGNOSIS — Z510 Encounter for antineoplastic radiation therapy: Secondary | ICD-10-CM | POA: Diagnosis not present

## 2020-12-10 DIAGNOSIS — Z170 Estrogen receptor positive status [ER+]: Secondary | ICD-10-CM | POA: Diagnosis not present

## 2020-12-11 ENCOUNTER — Ambulatory Visit
Admission: RE | Admit: 2020-12-11 | Discharge: 2020-12-11 | Disposition: A | Discharge status: Home / Self Care | Payer: Medicare PPO | Source: Ambulatory Visit | Attending: Radiation Oncology | Admitting: Radiation Oncology

## 2020-12-11 DIAGNOSIS — Z170 Estrogen receptor positive status [ER+]: Secondary | ICD-10-CM | POA: Diagnosis not present

## 2020-12-11 DIAGNOSIS — D0512 Intraductal carcinoma in situ of left breast: Secondary | ICD-10-CM | POA: Diagnosis not present

## 2020-12-11 DIAGNOSIS — Z510 Encounter for antineoplastic radiation therapy: Secondary | ICD-10-CM | POA: Diagnosis not present

## 2020-12-14 ENCOUNTER — Ambulatory Visit
Admission: RE | Admit: 2020-12-14 | Discharge: 2020-12-14 | Disposition: A | Discharge status: Home / Self Care | Payer: Medicare PPO | Source: Ambulatory Visit | Attending: Radiation Oncology | Admitting: Radiation Oncology

## 2020-12-14 DIAGNOSIS — Z510 Encounter for antineoplastic radiation therapy: Secondary | ICD-10-CM | POA: Diagnosis not present

## 2020-12-14 DIAGNOSIS — Z170 Estrogen receptor positive status [ER+]: Secondary | ICD-10-CM | POA: Diagnosis not present

## 2020-12-14 DIAGNOSIS — D0512 Intraductal carcinoma in situ of left breast: Secondary | ICD-10-CM | POA: Diagnosis not present

## 2020-12-15 ENCOUNTER — Ambulatory Visit
Admission: RE | Admit: 2020-12-15 | Discharge: 2020-12-15 | Disposition: A | Discharge status: Home / Self Care | Payer: Medicare PPO | Source: Ambulatory Visit | Attending: Radiation Oncology | Admitting: Radiation Oncology

## 2020-12-15 ENCOUNTER — Ambulatory Visit: Payer: Self-pay | Admitting: Family Medicine

## 2020-12-15 DIAGNOSIS — Z510 Encounter for antineoplastic radiation therapy: Secondary | ICD-10-CM | POA: Diagnosis not present

## 2020-12-15 DIAGNOSIS — D0512 Intraductal carcinoma in situ of left breast: Secondary | ICD-10-CM | POA: Diagnosis not present

## 2020-12-15 DIAGNOSIS — Z170 Estrogen receptor positive status [ER+]: Secondary | ICD-10-CM | POA: Diagnosis not present

## 2020-12-16 ENCOUNTER — Ambulatory Visit
Admission: RE | Admit: 2020-12-16 | Discharge: 2020-12-16 | Disposition: A | Discharge status: Home / Self Care | Payer: Medicare PPO | Source: Ambulatory Visit | Attending: Radiation Oncology | Admitting: Radiation Oncology

## 2020-12-16 DIAGNOSIS — Z170 Estrogen receptor positive status [ER+]: Secondary | ICD-10-CM | POA: Diagnosis not present

## 2020-12-16 DIAGNOSIS — D0512 Intraductal carcinoma in situ of left breast: Secondary | ICD-10-CM | POA: Diagnosis not present

## 2020-12-16 DIAGNOSIS — Z510 Encounter for antineoplastic radiation therapy: Secondary | ICD-10-CM | POA: Diagnosis not present

## 2020-12-17 ENCOUNTER — Ambulatory Visit: Payer: Medicare PPO

## 2020-12-17 ENCOUNTER — Other Ambulatory Visit: Payer: Self-pay

## 2020-12-17 ENCOUNTER — Ambulatory Visit
Admission: RE | Admit: 2020-12-17 | Discharge: 2020-12-17 | Disposition: A | Discharge status: Home / Self Care | Payer: Medicare PPO | Source: Ambulatory Visit | Attending: Radiation Oncology | Admitting: Radiation Oncology

## 2020-12-17 ENCOUNTER — Inpatient Hospital Stay: Payer: Medicare PPO | Attending: Oncology

## 2020-12-17 DIAGNOSIS — Z170 Estrogen receptor positive status [ER+]: Secondary | ICD-10-CM | POA: Diagnosis not present

## 2020-12-17 DIAGNOSIS — D0512 Intraductal carcinoma in situ of left breast: Secondary | ICD-10-CM | POA: Insufficient documentation

## 2020-12-17 DIAGNOSIS — Z510 Encounter for antineoplastic radiation therapy: Secondary | ICD-10-CM | POA: Diagnosis not present

## 2020-12-17 LAB — CBC
HCT: 40.5 % (ref 36.0–46.0)
Hemoglobin: 13.6 g/dL (ref 12.0–15.0)
MCH: 30.6 pg (ref 26.0–34.0)
MCHC: 33.6 g/dL (ref 30.0–36.0)
MCV: 91.2 fL (ref 80.0–100.0)
Platelets: 341 10*3/uL (ref 150–400)
RBC: 4.44 MIL/uL (ref 3.87–5.11)
RDW: 12.5 % (ref 11.5–15.5)
WBC: 9.7 10*3/uL (ref 4.0–10.5)
nRBC: 0 % (ref 0.0–0.2)

## 2020-12-18 ENCOUNTER — Ambulatory Visit
Admission: RE | Admit: 2020-12-18 | Discharge: 2020-12-18 | Disposition: A | Discharge status: Home / Self Care | Payer: Medicare PPO | Source: Ambulatory Visit | Attending: Radiation Oncology | Admitting: Radiation Oncology

## 2020-12-18 DIAGNOSIS — Z170 Estrogen receptor positive status [ER+]: Secondary | ICD-10-CM | POA: Diagnosis not present

## 2020-12-18 DIAGNOSIS — D0512 Intraductal carcinoma in situ of left breast: Secondary | ICD-10-CM | POA: Insufficient documentation

## 2020-12-18 DIAGNOSIS — Z510 Encounter for antineoplastic radiation therapy: Secondary | ICD-10-CM | POA: Insufficient documentation

## 2020-12-21 ENCOUNTER — Ambulatory Visit
Admission: RE | Admit: 2020-12-21 | Discharge: 2020-12-21 | Disposition: A | Discharge status: Home / Self Care | Payer: Medicare PPO | Source: Ambulatory Visit | Attending: Radiation Oncology | Admitting: Radiation Oncology

## 2020-12-21 DIAGNOSIS — Z170 Estrogen receptor positive status [ER+]: Secondary | ICD-10-CM | POA: Diagnosis not present

## 2020-12-21 DIAGNOSIS — Z510 Encounter for antineoplastic radiation therapy: Secondary | ICD-10-CM | POA: Diagnosis not present

## 2020-12-21 DIAGNOSIS — D0512 Intraductal carcinoma in situ of left breast: Secondary | ICD-10-CM | POA: Diagnosis not present

## 2020-12-22 ENCOUNTER — Ambulatory Visit
Admission: RE | Admit: 2020-12-22 | Discharge: 2020-12-22 | Disposition: A | Discharge status: Home / Self Care | Payer: Medicare PPO | Source: Ambulatory Visit | Attending: Radiation Oncology | Admitting: Radiation Oncology

## 2020-12-22 DIAGNOSIS — Z510 Encounter for antineoplastic radiation therapy: Secondary | ICD-10-CM | POA: Diagnosis not present

## 2020-12-22 DIAGNOSIS — D0512 Intraductal carcinoma in situ of left breast: Secondary | ICD-10-CM | POA: Diagnosis not present

## 2020-12-22 DIAGNOSIS — Z170 Estrogen receptor positive status [ER+]: Secondary | ICD-10-CM | POA: Diagnosis not present

## 2020-12-23 ENCOUNTER — Ambulatory Visit
Admission: RE | Admit: 2020-12-23 | Discharge: 2020-12-23 | Disposition: A | Discharge status: Home / Self Care | Payer: Medicare PPO | Source: Ambulatory Visit | Attending: Radiation Oncology | Admitting: Radiation Oncology

## 2020-12-23 DIAGNOSIS — Z510 Encounter for antineoplastic radiation therapy: Secondary | ICD-10-CM | POA: Diagnosis not present

## 2020-12-23 DIAGNOSIS — Z170 Estrogen receptor positive status [ER+]: Secondary | ICD-10-CM | POA: Diagnosis not present

## 2020-12-23 DIAGNOSIS — D0512 Intraductal carcinoma in situ of left breast: Secondary | ICD-10-CM | POA: Diagnosis not present

## 2020-12-24 ENCOUNTER — Ambulatory Visit
Admission: RE | Admit: 2020-12-24 | Discharge: 2020-12-24 | Disposition: A | Discharge status: Home / Self Care | Payer: Medicare PPO | Source: Ambulatory Visit | Attending: Radiation Oncology | Admitting: Radiation Oncology

## 2020-12-24 DIAGNOSIS — Z510 Encounter for antineoplastic radiation therapy: Secondary | ICD-10-CM | POA: Diagnosis not present

## 2020-12-24 DIAGNOSIS — D0512 Intraductal carcinoma in situ of left breast: Secondary | ICD-10-CM | POA: Diagnosis not present

## 2020-12-25 ENCOUNTER — Ambulatory Visit
Admission: RE | Admit: 2020-12-25 | Discharge: 2020-12-25 | Disposition: A | Discharge status: Home / Self Care | Payer: Medicare PPO | Source: Ambulatory Visit | Attending: Radiation Oncology | Admitting: Radiation Oncology

## 2020-12-25 DIAGNOSIS — Z170 Estrogen receptor positive status [ER+]: Secondary | ICD-10-CM | POA: Diagnosis not present

## 2020-12-25 DIAGNOSIS — Z510 Encounter for antineoplastic radiation therapy: Secondary | ICD-10-CM | POA: Diagnosis not present

## 2020-12-25 DIAGNOSIS — D0512 Intraductal carcinoma in situ of left breast: Secondary | ICD-10-CM | POA: Diagnosis not present

## 2020-12-25 NOTE — Progress Notes (Signed)
Established patient visit   Patient: Claire Kane   DOB: May 01, 1941   80 y.o. Female  MRN: [id number] Visit Date: 12/28/2020  Today's healthcare provider: Wilhemena Durie, MD   Chief Complaint  Patient presents with  . Hypertension  . Hyperlipidemia  . Hyperglycemia   Subjective    HPI  Patient feels well and has no complaints.  Blood pressure was 140/80 this morning at home. She is taking tolerating radiation oncology treatment of DCIS well. PreDiabetes, follow-up  Lab Results  Component Value Date   HGBA1C 6.0 (A) 12/28/2020   HGBA1C 5.9 (H) 06/18/2020   Last seen for diabetes 6 months ago.  Management since then includes continuing the same treatment. She reports good compliance with treatment. She is not having side effects.   Home blood sugar records: not being checked  Episodes of hypoglycemia? No    Current insulin regiment: none Most Recent Eye Exam: UTD  Hypertension, follow-up  BP Readings from Last 3 Encounters:  12/28/20 (!) 156/71  11/16/20 (!) 161/85  11/09/20 (!) 155/66   Wt Readings from Last 3 Encounters:  12/28/20 180 lb (81.6 kg)  11/16/20 179 lb 8 oz (81.4 kg)  11/09/20 180 lb (81.6 kg)     She was last seen for hypertension 6 months ago.  BP at that visit was 150/66. Management since that visit includes no medication changes. Continue to monitor at home.  She reports good compliance with treatment. She is not having side effects.  She is exercising. She is adherent to low salt diet.   Outside blood pressures are checked occasionally.  She does not smoke.  Use of agents associated with hypertension: none.   --------------------------------------------------------------------------------------------------- Lipid/Cholesterol, follow-up  Last Lipid Panel: Lab Results  Component Value Date   CHOL 145 06/18/2020   LDLCALC 81 06/18/2020   HDL 45 06/18/2020   TRIG 102 06/18/2020    She was last seen for this 6 months  ago.  Management since that visit includes no medication changes. Continue Zetia.   She reports good compliance with treatment. She is not having side effects.   Symptoms: No appetite changes No foot ulcerations  No chest pain No chest pressure/discomfort  No dyspnea No orthopnea  Yes fatigue No lower extremity edema  No palpitations No paroxysmal nocturnal dyspnea  No nausea No numbness or tingling of extremity  No polydipsia No polyuria  No speech difficulty No syncope   She is following a Regular diet. Current exercise: no regular exercise  Last metabolic panel Lab Results  Component Value Date   GLUCOSE 100 (H) 09/30/2020   NA 140 09/30/2020   K 3.9 09/30/2020   BUN 13 09/30/2020   CREATININE 0.80 09/30/2020   GFRNONAA >60 09/30/2020   GFRAA 79 06/18/2020   CALCIUM 9.0 09/30/2020   AST 17 09/24/2020   ALT 15 09/24/2020   The 10-year ASCVD risk score Mikey Bussing DC Jr., et al., 2013) is: 42.1%  ---------------------------------------------------------------------------------------------------       Medications: Outpatient Medications Prior to Visit  Medication Sig  . atorvastatin (LIPITOR) 10 MG tablet Take 1 tablet (10 mg total) by mouth daily.  . calcium citrate (CALCITRATE - DOSED IN MG ELEMENTAL CALCIUM) 950 MG tablet Take 200 mg of elemental calcium by mouth 2 (two) times daily.   Marland Kitchen docusate sodium (COLACE) 100 MG capsule Take 100 mg by mouth daily as needed for mild constipation.  Marland Kitchen ezetimibe (ZETIA) 10 MG tablet Take 10 mg by mouth  daily.  . Glucosamine 750 MG TABS Take 750 mg by mouth daily.  . Glycerin-Polysorbate 80 (REFRESH DRY EYE THERAPY OP) Place 1 drop into both eyes 2 (two) times daily.  . Multiple Vitamins-Minerals (MULTIVITAMIN WITH MINERALS) tablet Take 1 tablet by mouth daily.  Marland Kitchen omeprazole (PRILOSEC) 20 MG capsule Take 1 capsule (20 mg total) by mouth daily.  . psyllium (REGULOID) 0.52 G capsule Take 2 capsules by mouth daily.  . SOOLANTRA 1 %  CREA Apply 1 application topically daily as needed (rosacea).  Marland Kitchen spironolactone (ALDACTONE) 50 MG tablet Take 50 mg by mouth daily.  Marland Kitchen tolterodine (DETROL LA) 2 MG 24 hr capsule Take 1 capsule (2 mg total) by mouth daily.  . traMADol (ULTRAM) 50 MG tablet Take 1 tablet (50 mg total) by mouth every 6 (six) hours as needed. (Patient taking differently: Take 50 mg by mouth every 6 (six) hours as needed for severe pain.)  . ferrous sulfate 325 (65 FE) MG EC tablet Take 1 tablet (325 mg total) by mouth 2 (two) times daily with a meal. (Patient not taking: Reported on 12/28/2020)   No facility-administered medications prior to visit.    Review of Systems  Constitutional: Negative.   Respiratory: Negative.   Cardiovascular: Negative.   Gastrointestinal: Negative.   Endocrine: Negative.   Musculoskeletal: Negative.   Neurological: Negative.   Psychiatric/Behavioral: Negative.         Objective    BP (!) 156/71   Pulse 78   Temp 98 F (36.7 C)   Resp 16   Ht 5' 2.5" (1.588 m)   Wt 180 lb (81.6 kg)   BMI 32.40 kg/m  BP Readings from Last 3 Encounters:  12/28/20 (!) 156/71  11/16/20 (!) 161/85  11/09/20 (!) 155/66   Wt Readings from Last 3 Encounters:  12/28/20 180 lb (81.6 kg)  11/16/20 179 lb 8 oz (81.4 kg)  11/09/20 180 lb (81.6 kg)       Physical Exam Vitals reviewed.  Constitutional:      Appearance: She is well-developed.  HENT:     Head: Normocephalic and atraumatic.     Right Ear: External ear normal.     Left Ear: External ear normal.     Nose: Nose normal.  Eyes:     General: No scleral icterus.    Conjunctiva/sclera: Conjunctivae normal.  Neck:     Thyroid: No thyromegaly.  Cardiovascular:     Rate and Rhythm: Normal rate and regular rhythm.     Heart sounds: Normal heart sounds.  Pulmonary:     Effort: Pulmonary effort is normal.     Breath sounds: Normal breath sounds.  Abdominal:     Palpations: Abdomen is soft.  Lymphadenopathy:     Cervical: No  cervical adenopathy.  Skin:    General: Skin is warm and dry.  Neurological:     General: No focal deficit present.     Mental Status: She is alert and oriented to person, place, and time.  Psychiatric:        Mood and Affect: Mood normal.        Behavior: Behavior normal.        Thought Content: Thought content normal.        Judgment: Judgment normal.       Results for orders placed or performed in visit on 12/28/20  POCT glycosylated hemoglobin (Hb A1C)  Result Value Ref Range   Hemoglobin A1C 6.0 (A) 4.0 - 5.6 %  HbA1c POC (<> result, manual entry)     HbA1c, POC (prediabetic range)     HbA1c, POC (controlled diabetic range)      Assessment & Plan     1. Essential hypertension Add amlodipine 5 mg daily.  Follow-up 2 months - Comprehensive metabolic panel - amLODipine (NORVASC) 5 MG tablet; Take 1 tablet (5 mg total) by mouth daily.  Dispense: 30 tablet; Refill: 3  2. Other hyperlipidemia On treatment, on atorvastatin 10 and Zetia. - Lipid panel  3. Borderline diabetes mellitus A1c of 6.0 today.  Continue to work on diet and exercise - POCT glycosylated hemoglobin (Hb A1C) - Hemoglobin A1c  4. Iron deficiency anemia, unspecified iron deficiency anemia type Follow-up CBC when appropriate  5. Ductal carcinoma in situ (DCIS) of left breast On radiation therapy  6. Class 1 obesity due to excess calories with serious comorbidity and body mass index (BMI) of 32.0 to 32.9 in adult With hypertension and hyperlipidemia and GERD.   No follow-ups on file.      I, Wilhemena Durie, MD, have reviewed all documentation for this visit. The documentation on 12/31/20 for the exam, diagnosis, procedures, and orders are all accurate and complete.    Sahiti Joswick Cranford Mon, MD  [facility] [phone] (phone) [phone] (fax)  [place]

## 2020-12-26 ENCOUNTER — Other Ambulatory Visit: Payer: Self-pay | Admitting: Family Medicine

## 2020-12-28 ENCOUNTER — Ambulatory Visit
Admission: RE | Admit: 2020-12-28 | Discharge: 2020-12-28 | Disposition: A | Discharge status: Home / Self Care | Payer: Medicare PPO | Source: Ambulatory Visit | Attending: Radiation Oncology | Admitting: Radiation Oncology

## 2020-12-28 ENCOUNTER — Encounter: Payer: Self-pay | Admitting: Family Medicine

## 2020-12-28 ENCOUNTER — Ambulatory Visit (INDEPENDENT_AMBULATORY_CARE_PROVIDER_SITE_OTHER): Payer: Medicare PPO | Admitting: Family Medicine

## 2020-12-28 ENCOUNTER — Other Ambulatory Visit: Payer: Self-pay

## 2020-12-28 VITALS — BP 156/71 | HR 78 | Temp 98.0°F | Resp 16 | Ht 62.5 in | Wt 180.0 lb

## 2020-12-28 DIAGNOSIS — E6609 Other obesity due to excess calories: Secondary | ICD-10-CM | POA: Diagnosis not present

## 2020-12-28 DIAGNOSIS — D509 Iron deficiency anemia, unspecified: Secondary | ICD-10-CM | POA: Diagnosis not present

## 2020-12-28 DIAGNOSIS — D0512 Intraductal carcinoma in situ of left breast: Secondary | ICD-10-CM | POA: Diagnosis not present

## 2020-12-28 DIAGNOSIS — R7303 Prediabetes: Secondary | ICD-10-CM

## 2020-12-28 DIAGNOSIS — I10 Essential (primary) hypertension: Secondary | ICD-10-CM

## 2020-12-28 DIAGNOSIS — Z170 Estrogen receptor positive status [ER+]: Secondary | ICD-10-CM | POA: Diagnosis not present

## 2020-12-28 DIAGNOSIS — E7849 Other hyperlipidemia: Secondary | ICD-10-CM

## 2020-12-28 DIAGNOSIS — Z510 Encounter for antineoplastic radiation therapy: Secondary | ICD-10-CM | POA: Diagnosis not present

## 2020-12-28 DIAGNOSIS — Z6832 Body mass index (BMI) 32.0-32.9, adult: Secondary | ICD-10-CM | POA: Diagnosis not present

## 2020-12-28 LAB — POCT GLYCOSYLATED HEMOGLOBIN (HGB A1C): Hemoglobin A1C: 6 % — AB (ref 4.0–5.6)

## 2020-12-28 MED ORDER — AMLODIPINE BESYLATE 5 MG PO TABS: 5.0000 mg | ORAL_TABLET | Freq: Every day | ORAL | 3 refills | Status: DC

## 2020-12-29 ENCOUNTER — Ambulatory Visit: Payer: Medicare PPO

## 2020-12-29 ENCOUNTER — Ambulatory Visit
Admission: RE | Admit: 2020-12-29 | Discharge: 2020-12-29 | Disposition: A | Discharge status: Home / Self Care | Payer: Medicare PPO | Source: Ambulatory Visit | Attending: Radiation Oncology | Admitting: Radiation Oncology

## 2020-12-29 DIAGNOSIS — Z510 Encounter for antineoplastic radiation therapy: Secondary | ICD-10-CM | POA: Diagnosis not present

## 2020-12-29 DIAGNOSIS — D0512 Intraductal carcinoma in situ of left breast: Secondary | ICD-10-CM | POA: Diagnosis not present

## 2020-12-29 DIAGNOSIS — Z170 Estrogen receptor positive status [ER+]: Secondary | ICD-10-CM | POA: Diagnosis not present

## 2020-12-30 ENCOUNTER — Ambulatory Visit
Admission: RE | Admit: 2020-12-30 | Discharge: 2020-12-30 | Disposition: A | Discharge status: Home / Self Care | Payer: Medicare PPO | Source: Ambulatory Visit | Attending: Radiation Oncology | Admitting: Radiation Oncology

## 2020-12-30 DIAGNOSIS — Z510 Encounter for antineoplastic radiation therapy: Secondary | ICD-10-CM | POA: Diagnosis not present

## 2020-12-30 DIAGNOSIS — D0512 Intraductal carcinoma in situ of left breast: Secondary | ICD-10-CM | POA: Diagnosis not present

## 2020-12-31 ENCOUNTER — Ambulatory Visit
Admission: RE | Admit: 2020-12-31 | Discharge: 2020-12-31 | Disposition: A | Discharge status: Home / Self Care | Payer: Medicare PPO | Source: Ambulatory Visit | Attending: Radiation Oncology | Admitting: Radiation Oncology

## 2020-12-31 DIAGNOSIS — Z510 Encounter for antineoplastic radiation therapy: Secondary | ICD-10-CM | POA: Diagnosis not present

## 2020-12-31 DIAGNOSIS — D0512 Intraductal carcinoma in situ of left breast: Secondary | ICD-10-CM | POA: Diagnosis not present

## 2021-01-01 ENCOUNTER — Ambulatory Visit
Admission: RE | Admit: 2021-01-01 | Discharge: 2021-01-01 | Disposition: A | Discharge status: Home / Self Care | Payer: Medicare PPO | Source: Ambulatory Visit | Attending: Radiation Oncology | Admitting: Radiation Oncology

## 2021-01-01 DIAGNOSIS — Z510 Encounter for antineoplastic radiation therapy: Secondary | ICD-10-CM | POA: Diagnosis not present

## 2021-01-01 DIAGNOSIS — D0512 Intraductal carcinoma in situ of left breast: Secondary | ICD-10-CM | POA: Diagnosis not present

## 2021-01-04 ENCOUNTER — Ambulatory Visit: Payer: Medicare PPO

## 2021-01-04 ENCOUNTER — Ambulatory Visit
Admission: RE | Admit: 2021-01-04 | Discharge: 2021-01-04 | Disposition: A | Discharge status: Home / Self Care | Payer: Medicare PPO | Source: Ambulatory Visit | Attending: Radiation Oncology | Admitting: Radiation Oncology

## 2021-01-04 DIAGNOSIS — D0512 Intraductal carcinoma in situ of left breast: Secondary | ICD-10-CM | POA: Diagnosis not present

## 2021-01-04 DIAGNOSIS — Z510 Encounter for antineoplastic radiation therapy: Secondary | ICD-10-CM | POA: Diagnosis not present

## 2021-01-05 ENCOUNTER — Other Ambulatory Visit: Payer: Self-pay | Admitting: Family Medicine

## 2021-01-05 ENCOUNTER — Ambulatory Visit
Admission: RE | Admit: 2021-01-05 | Discharge: 2021-01-05 | Disposition: A | Discharge status: Home / Self Care | Payer: Medicare PPO | Source: Ambulatory Visit | Attending: Radiation Oncology | Admitting: Radiation Oncology

## 2021-01-05 DIAGNOSIS — Z510 Encounter for antineoplastic radiation therapy: Secondary | ICD-10-CM | POA: Diagnosis not present

## 2021-01-05 DIAGNOSIS — D0512 Intraductal carcinoma in situ of left breast: Secondary | ICD-10-CM | POA: Diagnosis not present

## 2021-01-05 NOTE — Telephone Encounter (Signed)
[employer] advised her to call the office, prescription was written by a different provider, pt wants to know if Dr Rosanna Randy would write her a new prescription.

## 2021-01-05 NOTE — Telephone Encounter (Signed)
Medication Refill - Medication:   traMADol (ULTRAM) 50 MG tablet   Has the patient contacted their pharmacy? Yes.  [employer] advised her to call the office, prescription was written by a different provider, pt wants to know if Dr Rosanna Randy would write her a new prescription.   Preferred Pharmacy (with phone number or street name):   [address] [address], [place]  [place] [address]  Phone: [phone] Fax: [phone]    Agent: Please be advised that RX refills may take up to 3 business days. We ask that you follow-up with your pharmacy.

## 2021-01-07 MED ORDER — TRAMADOL HCL 50 MG PO TABS: 50.0000 mg | ORAL_TABLET | Freq: Four times a day (QID) | ORAL | 1 refills | Status: DC | PRN

## 2021-01-08 ENCOUNTER — Encounter: Payer: Self-pay | Admitting: Oncology

## 2021-01-08 ENCOUNTER — Telehealth: Payer: Self-pay

## 2021-01-08 ENCOUNTER — Inpatient Hospital Stay: Payer: Medicare PPO | Attending: Oncology | Admitting: Oncology

## 2021-01-08 VITALS — BP 140/82 | HR 81 | Temp 98.2°F | Resp 18 | Wt 179.2 lb

## 2021-01-08 DIAGNOSIS — D0512 Intraductal carcinoma in situ of left breast: Secondary | ICD-10-CM | POA: Insufficient documentation

## 2021-01-08 DIAGNOSIS — Z853 Personal history of malignant neoplasm of breast: Secondary | ICD-10-CM | POA: Diagnosis not present

## 2021-01-08 DIAGNOSIS — M8580 Other specified disorders of bone density and structure, unspecified site: Secondary | ICD-10-CM | POA: Insufficient documentation

## 2021-01-08 DIAGNOSIS — Z86000 Personal history of in-situ neoplasm of breast: Secondary | ICD-10-CM | POA: Diagnosis not present

## 2021-01-08 MED ORDER — ANASTROZOLE 1 MG PO TABS: 1.0000 mg | ORAL_TABLET | Freq: Every day | ORAL | 0 refills | Status: DC
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
## 2021-01-08 NOTE — Progress Notes (Signed)
Pt states she has noticed more diarrhea since she started her BP medication.

## 2021-01-08 NOTE — Telephone Encounter (Deleted)
Liquid bx sample was collected on 01/07/2021. Foundation one order submitted online.

## 2021-02-08 ENCOUNTER — Other Ambulatory Visit: Payer: Self-pay

## 2021-02-08 ENCOUNTER — Encounter: Payer: Self-pay | Admitting: Radiation Oncology

## 2021-02-08 ENCOUNTER — Ambulatory Visit
Admission: RE | Admit: 2021-02-08 | Discharge: 2021-02-08 | Disposition: A | Discharge status: Home / Self Care | Payer: Medicare PPO | Source: Ambulatory Visit | Attending: Radiation Oncology | Admitting: Radiation Oncology

## 2021-02-08 VITALS — BP 154/81 | HR 86 | Temp 96.5°F | Resp 18 | Wt 178.9 lb

## 2021-02-08 DIAGNOSIS — Z923 Personal history of irradiation: Secondary | ICD-10-CM | POA: Insufficient documentation

## 2021-02-08 DIAGNOSIS — Z170 Estrogen receptor positive status [ER+]: Secondary | ICD-10-CM | POA: Diagnosis not present

## 2021-02-08 DIAGNOSIS — Z79811 Long term (current) use of aromatase inhibitors: Secondary | ICD-10-CM | POA: Diagnosis not present

## 2021-02-08 DIAGNOSIS — D0512 Intraductal carcinoma in situ of left breast: Secondary | ICD-10-CM | POA: Insufficient documentation

## 2021-02-08 NOTE — Progress Notes (Signed)
Radiation Oncology Follow up Note  Name: Claire Kane   Date:   02/08/2021 MRN:  [id number] DOB: Apr 08, 1941    This 80 y.o. female presents to the clinic today for 1 month follow-up status post whole breast radiation to her left breast for DCIS ER positive.  REFERRING PROVIDER: Jerrol Banana.,*  HPI: Patient is a 80 year old female now at 1 month having completed whole breast radiation to her left breast for ER positive DCIS.  She had previously 20 years prior-radiation therapy to her right breast.  She is seen today in routine follow-up and is doing well.  She is noted inversion of the right nipple although I believe that is been there for a long time.  She specifically [place] breast tenderness cough or bone pain..  She has been started on Arimidex tolerating it well without side effect.  COMPLICATIONS OF TREATMENT: none  FOLLOW UP COMPLIANCE: keeps appointments   PHYSICAL EXAM:  BP (!) 154/81 (BP Location: Left Arm, Patient Position: Sitting)   Pulse 86   Temp (!) 96.5 F (35.8 C) (Tympanic)   Resp 18   Wt 178 lb 14.4 oz (81.1 kg)   BMI 32.20 kg/m  Lungs are clear to A&P cardiac examination essentially unremarkable with regular rate and rhythm. No dominant mass or nodularity is noted in either breast in 2 positions examined. Incision is well-healed. No axillary or supraclavicular adenopathy is appreciated. Cosmetic result is excellent.  Well-developed well-nourished patient in NAD. HEENT reveals PERLA, EOMI, discs not visualized.  Oral cavity is clear. No oral mucosal lesions are identified. Neck is clear without evidence of cervical or supraclavicular adenopathy. Lungs are clear to A&P. Cardiac examination is essentially unremarkable with regular rate and rhythm without murmur rub or thrill. Abdomen is benign with no organomegaly or masses noted. Motor sensory and DTR levels are equal and symmetric in the upper and lower extremities. Cranial nerves II through XII are grossly  intact. Proprioception is intact. No peripheral adenopathy or edema is identified. No motor or sensory levels are noted. Crude visual fields are within normal range.  RADIOLOGY RESULTS: No current films for review   PLAN: Present time patient is doing well 1 month out from whole breast radiation to her left breast.  She is currently on Arimidex tolerant well without side effect.  Patient knows to call with any concerns.  Of asked to see her back in 4 to 5 months.  I would like to take this opportunity to thank you for allowing me to participate in the care of your patient.Noreene Filbert, MD

## 2021-02-11 ENCOUNTER — Other Ambulatory Visit: Payer: Self-pay | Admitting: Family Medicine

## 2021-02-11 NOTE — Telephone Encounter (Signed)
Medication Refill - Medication: Zetia   Has the patient contacted their pharmacy? No.  [facility] told her to call the office for the refill.) (Agent: If yes, when and what did the pharmacy advise?)  Preferred Pharmacy (with phone number or street name): [facility]  Agent: Please be advised that RX refills may take up to 3 business days. We ask that you follow-up with your pharmacy.

## 2021-02-11 NOTE — Telephone Encounter (Signed)
Requested medication (s) are due for refill today - yes  Requested medication (s) are on the active medication list -yes  Future visit scheduled -yes  Last refill: 4 months ago  Notes to clinic: Patient request RF- historical medication  Requested Prescriptions  Pending Prescriptions Disp Refills   ezetimibe (ZETIA) 10 MG tablet      Sig: Take 1 tablet (10 mg total) by mouth daily.      Cardiovascular:  Antilipid - Sterol Transport Inhibitors Passed - 02/11/2021  4:05 PM      Passed - Total Cholesterol in normal range and within 360 days    Cholesterol, Total  Date Value Ref Range Status  06/18/2020 145 100 - 199 mg/dL Final          Passed - LDL in normal range and within 360 days    LDL Chol Calc (NIH)  Date Value Ref Range Status  06/18/2020 81 0 - 99 mg/dL Final          Passed - HDL in normal range and within 360 days    HDL  Date Value Ref Range Status  06/18/2020 45 >39 mg/dL Final          Passed - Triglycerides in normal range and within 360 days    Triglycerides  Date Value Ref Range Status  06/18/2020 102 0 - 149 mg/dL Final          Passed - Valid encounter within last 12 months    Recent Outpatient Visits           1 month ago Essential hypertension   [facility] Jerrol Banana., MD   7 months ago Encounter for annual wellness visit (AWV) in Medicare patient   [facility] Rosanna Randy, Retia Passe., MD   1 year ago Encounter for annual physical exam   [facility] Jerrol Banana., MD   2 years ago Anemia, unspecified type   [facility] Jerrol Banana., MD   2 years ago Encounter for annual physical exam   [facility] Jerrol Banana., MD       Future Appointments             In 1 month Jerrol Banana., MD [facility]                 Requested Prescriptions  Pending Prescriptions Disp Refills   ezetimibe  (ZETIA) 10 MG tablet      Sig: Take 1 tablet (10 mg total) by mouth daily.      Cardiovascular:  Antilipid - Sterol Transport Inhibitors Passed - 02/11/2021  4:05 PM      Passed - Total Cholesterol in normal range and within 360 days    Cholesterol, Total  Date Value Ref Range Status  06/18/2020 145 100 - 199 mg/dL Final          Passed - LDL in normal range and within 360 days    LDL Chol Calc (NIH)  Date Value Ref Range Status  06/18/2020 81 0 - 99 mg/dL Final          Passed - HDL in normal range and within 360 days    HDL  Date Value Ref Range Status  06/18/2020 45 >39 mg/dL Final          Passed - Triglycerides in normal range and within 360 days    Triglycerides  Date Value Ref Range  Status  06/18/2020 102 0 - 149 mg/dL Final          Passed - Valid encounter within last 12 months    Recent Outpatient Visits           1 month ago Essential hypertension   [facility] Jerrol Banana., MD   7 months ago Encounter for annual wellness visit (AWV) in Medicare patient   [facility] Jerrol Banana., MD   1 year ago Encounter for annual physical exam   [facility] Jerrol Banana., MD   2 years ago Anemia, unspecified type   [facility] Jerrol Banana., MD   2 years ago Encounter for annual physical exam   [facility] Jerrol Banana., MD       Future Appointments             In 1 month Jerrol Banana., MD [facility] [place]

## 2021-02-12 MED ORDER — TRAMADOL HCL 50 MG PO TABS: 50.0000 mg | ORAL_TABLET | Freq: Four times a day (QID) | ORAL | 1 refills | Status: DC | PRN

## 2021-02-16 ENCOUNTER — Other Ambulatory Visit: Payer: Self-pay

## 2021-02-16 DIAGNOSIS — D0512 Intraductal carcinoma in situ of left breast: Secondary | ICD-10-CM

## 2021-02-17 ENCOUNTER — Inpatient Hospital Stay: Payer: Medicare PPO | Attending: Oncology

## 2021-02-17 ENCOUNTER — Encounter: Payer: Self-pay | Admitting: Oncology

## 2021-02-17 ENCOUNTER — Inpatient Hospital Stay (HOSPITAL_BASED_OUTPATIENT_CLINIC_OR_DEPARTMENT_OTHER): Payer: Medicare PPO | Admitting: Oncology

## 2021-02-17 VITALS — BP 162/89 | HR 92 | Temp 98.0°F | Resp 18 | Wt 178.3 lb

## 2021-02-17 DIAGNOSIS — D0512 Intraductal carcinoma in situ of left breast: Secondary | ICD-10-CM | POA: Diagnosis not present

## 2021-02-17 DIAGNOSIS — Z79811 Long term (current) use of aromatase inhibitors: Secondary | ICD-10-CM | POA: Diagnosis not present

## 2021-02-17 DIAGNOSIS — M8580 Other specified disorders of bone density and structure, unspecified site: Secondary | ICD-10-CM | POA: Insufficient documentation

## 2021-02-17 DIAGNOSIS — Z170 Estrogen receptor positive status [ER+]: Secondary | ICD-10-CM | POA: Diagnosis not present

## 2021-02-17 DIAGNOSIS — D509 Iron deficiency anemia, unspecified: Secondary | ICD-10-CM | POA: Insufficient documentation

## 2021-02-17 LAB — COMPREHENSIVE METABOLIC PANEL
ALT: 15 U/L (ref 0–44)
AST: 18 U/L (ref 15–41)
Albumin: 4.2 g/dL (ref 3.5–5.0)
Alkaline Phosphatase: 71 U/L (ref 38–126)
Anion gap: 11 (ref 5–15)
BUN: 18 mg/dL (ref 8–23)
CO2: 27 mmol/L (ref 22–32)
Calcium: 9.3 mg/dL (ref 8.9–10.3)
Chloride: 99 mmol/L (ref 98–111)
Creatinine, Ser: 0.8 mg/dL (ref 0.44–1.00)
GFR, Estimated: >60 mL/min (ref >60)
Glucose, Bld: 103 mg/dL — ABNORMAL HIGH (ref 70–99)
Potassium: 4.1 mmol/L (ref 3.5–5.1)
Sodium: 137 mmol/L (ref 135–145)
Total Bilirubin: 1.1 mg/dL (ref 0.3–1.2)
Total Protein: 7.2 g/dL (ref 6.5–8.1)

## 2021-02-17 LAB — CBC WITH DIFFERENTIAL/PLATELET
Abs Immature Granulocytes: 0.03 10*3/uL (ref 0.00–0.07)
Basophils Absolute: 0.1 10*3/uL (ref 0.0–0.1)
Basophils Relative: 1 %
Eosinophils Absolute: 0.2 10*3/uL (ref 0.0–0.5)
Eosinophils Relative: 2 %
HCT: 40.9 % (ref 36.0–46.0)
Hemoglobin: 14.1 g/dL (ref 12.0–15.0)
Immature Granulocytes: 0 %
Lymphocytes Relative: 25 %
Lymphs Abs: 2.3 10*3/uL (ref 0.7–4.0)
MCH: 31 pg (ref 26.0–34.0)
MCHC: 34.5 g/dL (ref 30.0–36.0)
MCV: 89.9 fL (ref 80.0–100.0)
Monocytes Absolute: 0.9 10*3/uL (ref 0.1–1.0)
Monocytes Relative: 11 %
Neutro Abs: 5.5 10*3/uL (ref 1.7–7.7)
Neutrophils Relative %: 61 %
Platelets: 336 10*3/uL (ref 150–400)
RBC: 4.55 MIL/uL (ref 3.87–5.11)
RDW: 12.7 % (ref 11.5–15.5)
WBC: 9 10*3/uL (ref 4.0–10.5)
nRBC: 0 % (ref 0.0–0.2)
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
## 2021-02-17 NOTE — Progress Notes (Signed)
Patient here for follow up. Pt has started arimidex and is having some aches and pains to body, but is unsure if it can be related to medication.

## 2021-02-18 ENCOUNTER — Other Ambulatory Visit: Payer: Medicare PPO

## 2021-02-18 ENCOUNTER — Ambulatory Visit: Payer: Medicare PPO | Admitting: Oncology

## 2021-02-19 ENCOUNTER — Other Ambulatory Visit: Payer: Self-pay | Admitting: Oncology

## 2021-02-22 ENCOUNTER — Inpatient Hospital Stay: Payer: Medicare PPO

## 2021-02-22 VITALS — BP 137/64 | HR 77 | Temp 96.0°F | Resp 18

## 2021-02-22 DIAGNOSIS — M8580 Other specified disorders of bone density and structure, unspecified site: Secondary | ICD-10-CM | POA: Diagnosis not present

## 2021-02-22 DIAGNOSIS — D0512 Intraductal carcinoma in situ of left breast: Secondary | ICD-10-CM | POA: Diagnosis not present

## 2021-02-22 DIAGNOSIS — D509 Iron deficiency anemia, unspecified: Secondary | ICD-10-CM | POA: Diagnosis not present

## 2021-02-22 DIAGNOSIS — Z170 Estrogen receptor positive status [ER+]: Secondary | ICD-10-CM | POA: Diagnosis not present

## 2021-02-22 MED ORDER — SODIUM CHLORIDE 0.9 % IV SOLN
Freq: Once | INTRAVENOUS | Status: AC
  Filled 2021-02-22: qty 250

## 2021-02-22 MED ORDER — ZOLEDRONIC ACID 4 MG/5ML IV CONC
3.5000 mg | Freq: Once | INTRAVENOUS | Status: AC
  Administered 2021-02-22: 3.5 mg via INTRAVENOUS
  Filled 2021-02-22: qty 4.38

## 2021-02-22 NOTE — Patient Instructions (Signed)
Zoledronic Acid Injection (Hypercalcemia, Oncology) What is this medicine? ZOLEDRONIC ACID (ZOE le dron ik AS id) slows calcium loss from bones. It high calcium levels in the blood from some kinds of cancer. It may be used in other people at risk for bone loss. This medicine may be used for other purposes; ask your health care provider or pharmacist if you have questions. COMMON BRAND NAME(S): Zometa What should I tell my health care provider before I take this medicine? They need to know if you have any of these conditions:  cancer  dehydration  dental disease  kidney disease  liver disease  low levels of calcium in the blood  lung or breathing disease (asthma)  receiving steroids like dexamethasone or prednisone  an unusual or allergic reaction to zoledronic acid, other medicines, foods, dyes, or preservatives  pregnant or trying to get pregnant  breast-feeding How should I use this medicine? This drug is injected into a vein. It is given by a health care provider in a hospital or clinic setting. Talk to your health care provider about the use of this drug in children. Special care may be needed. Overdosage: If you think you have taken too much of this medicine contact a poison control center or emergency room at once. NOTE: This medicine is only for you. Do not share this medicine with others. What if I miss a dose? Keep appointments for follow-up doses. It is important not to miss your dose. Call your health care provider if you are unable to keep an appointment. What may interact with this medicine?  certain antibiotics given by injection  NSAIDs, medicines for pain and inflammation, like ibuprofen or naproxen  some diuretics like bumetanide, furosemide  teriparatide  thalidomide This list may not describe all possible interactions. Give your health care provider a list of all the medicines, herbs, non-prescription drugs, or dietary supplements you use. Also tell  them if you smoke, drink alcohol, or use illegal drugs. Some items may interact with your medicine. What should I watch for while using this medicine? Visit your health care provider for regular checks on your progress. It may be some time before you see the benefit from this drug. Some people who take this drug have severe bone, joint, or muscle pain. This drug may also increase your risk for jaw problems or a broken thigh bone. Tell your health care provider right away if you have severe pain in your jaw, bones, joints, or muscles. Tell you health care provider if you have any pain that does not go away or that gets worse. Tell your dentist and dental surgeon that you are taking this drug. You should not have major dental surgery while on this drug. See your dentist to have a dental exam and fix any dental problems before starting this drug. Take good care of your teeth while on this drug. Make sure you see your dentist for regular follow-up appointments. You should make sure you get enough calcium and vitamin D while you are taking this drug. Discuss the foods you eat and the vitamins you take with your health care provider. Check with your health care provider if you have severe diarrhea, nausea, and vomiting, or if you sweat a lot. The loss of too much body fluid may make it dangerous for you to take this drug. You may need blood work done while you are taking this drug. Do not become pregnant while taking this drug. Women should inform their health care provider   if they wish to become pregnant or think they might be pregnant. There is potential for serious harm to an unborn child. Talk to your health care provider for more information. What side effects may I notice from receiving this medicine? Side effects that you should report to your doctor or health care provider as soon as possible:  allergic reactions (skin rash, itching or hives; swelling of the face, lips, or tongue)  bone  pain  infection (fever, chills, cough, sore throat, pain or trouble passing urine)  jaw pain, especially after dental work  joint pain  kidney injury (trouble passing urine or change in the amount of urine)  low blood pressure (dizziness; feeling faint or lightheaded, falls; unusually weak or tired)  low calcium levels (fast heartbeat; muscle cramps or pain; pain, tingling, or numbness in the hands or feet; seizures)  low magnesium levels (fast, irregular heartbeat; muscle cramp or pain; muscle weakness; tremors; seizures)  low red blood cell counts (trouble breathing; feeling faint; lightheaded, falls; unusually weak or tired)  muscle pain  redness, blistering, peeling, or loosening of the skin, including inside the mouth  severe diarrhea  swelling of the ankles, feet, hands  trouble breathing Side effects that usually do not require medical attention (report to your doctor or health care provider if they continue or are bothersome):  anxious  constipation  coughing  depressed mood  eye irritation, itching, or pain  fever  general ill feeling or flu-like symptoms  nausea  pain, redness, or irritation at site where injected  trouble sleeping This list may not describe all possible side effects. Call your doctor for medical advice about side effects. You may report side effects to FDA at 1-800-FDA-1088. Where should I keep my medicine? This drug is given in a hospital or clinic. It will not be stored at home. NOTE: This sheet is a summary. It may not cover all possible information. If you have questions about this medicine, talk to your doctor, pharmacist, or health care provider.  2021 Elsevier/Gold Standard (2019-06-20 09:13:00)  

## 2021-02-23 ENCOUNTER — Telehealth: Payer: Self-pay | Admitting: *Deleted

## 2021-02-23 NOTE — Telephone Encounter (Signed)
Patient called reporting that she is having side effects form her infusion (iron) yesterday and that she is having pain and wants to know what she can atke for ir. Please advise

## 2021-02-23 NOTE — Telephone Encounter (Signed)
Pt had *new* zometa infusion yesterday. Hassan Rowan did she say what kind of side effects she is having besides the pain?

## 2021-02-23 NOTE — Telephone Encounter (Signed)
Called patient and gave her Josh's recommendations. Pt voiced understanding. Pt reports that temp has been around 99 but that she is feeling better than yesterday and will call us/ go to ER if she feels worse.

## 2021-02-23 NOTE — Telephone Encounter (Signed)
If no other symptoms (fever, chills, n/v/d, etc), likely bone pain from Zometa. Would recommend trying her on Claritin 10mg  daily for next 3-4 days. She can also take her tramadol as needed for pain. See [facility] or ER if symptoms are worsening or do not improve.

## 2021-03-01 DIAGNOSIS — M79675 Pain in left toe(s): Secondary | ICD-10-CM | POA: Diagnosis not present

## 2021-03-01 DIAGNOSIS — I872 Venous insufficiency (chronic) (peripheral): Secondary | ICD-10-CM | POA: Diagnosis not present

## 2021-03-01 DIAGNOSIS — L603 Nail dystrophy: Secondary | ICD-10-CM | POA: Diagnosis not present

## 2021-03-01 DIAGNOSIS — M79674 Pain in right toe(s): Secondary | ICD-10-CM | POA: Diagnosis not present

## 2021-03-01 DIAGNOSIS — L600 Ingrowing nail: Secondary | ICD-10-CM | POA: Diagnosis not present

## 2021-03-08 ENCOUNTER — Other Ambulatory Visit: Payer: Self-pay | Admitting: Family Medicine

## 2021-03-08 ENCOUNTER — Telehealth: Payer: Self-pay | Admitting: Family Medicine

## 2021-03-08 ENCOUNTER — Telehealth: Payer: Self-pay

## 2021-03-08 ENCOUNTER — Telehealth: Payer: Self-pay | Admitting: *Deleted

## 2021-03-08 ENCOUNTER — Other Ambulatory Visit: Payer: Self-pay

## 2021-03-08 DIAGNOSIS — I10 Essential (primary) hypertension: Secondary | ICD-10-CM

## 2021-03-08 MED ORDER — EZETIMIBE 10 MG PO TABS: 10.0000 mg | ORAL_TABLET | Freq: Every day | ORAL | 0 refills | Status: DC

## 2021-03-08 MED ORDER — ANASTROZOLE 1 MG PO TABS: 1.0000 mg | ORAL_TABLET | Freq: Every day | ORAL | 2 refills | Status: DC

## 2021-03-08 MED ORDER — AMLODIPINE BESYLATE 5 MG PO TABS: 5.0000 mg | ORAL_TABLET | Freq: Every day | ORAL | 3 refills | Status: DC

## 2021-03-08 NOTE — Telephone Encounter (Signed)
Zetia was sent to pharmacy. However, there is a refill pending at [facility] for Tramadol.

## 2021-03-08 NOTE — Telephone Encounter (Signed)
Notes to clinic: this refill cannot be delegated  Other medication filled by historical provider    Requested Prescriptions  Pending Prescriptions Disp Refills   traMADol (ULTRAM) 50 MG tablet 55 tablet 1    Sig: Take 1 tablet (50 mg total) by mouth every 6 (six) hours as needed for severe pain.      Not Delegated - Analgesics:  Opioid Agonists Failed - 03/08/2021 11:17 AM      Failed - This refill cannot be delegated      Failed - Urine Drug Screen completed in last 360 days      Passed - Valid encounter within last 6 months    Recent Outpatient Visits           2 months ago Essential hypertension   [facility] Jerrol Banana., MD   8 months ago Encounter for annual wellness visit (AWV) in Medicare patient   [facility] Rosanna Randy, Retia Passe., MD   1 year ago Encounter for annual physical exam   [facility] Jerrol Banana., MD   2 years ago Anemia, unspecified type   [facility] Jerrol Banana., MD   3 years ago Encounter for annual physical exam   [facility] Jerrol Banana., MD       Future Appointments             In 1 month Jerrol Banana., MD [facility]               ezetimibe (ZETIA) 10 MG tablet      Sig: Take 1 tablet (10 mg total) by mouth daily.      Cardiovascular:  Antilipid - Sterol Transport Inhibitors Passed - 03/08/2021 11:17 AM      Passed - Total Cholesterol in normal range and within 360 days    Cholesterol, Total  Date Value Ref Range Status  06/18/2020 145 100 - 199 mg/dL Final          Passed - LDL in normal range and within 360 days    LDL Chol Calc (NIH)  Date Value Ref Range Status  06/18/2020 81 0 - 99 mg/dL Final          Passed - HDL in normal range and within 360 days    HDL  Date Value Ref Range Status  06/18/2020 45 >39 mg/dL Final          Passed - Triglycerides in normal range and  within 360 days    Triglycerides  Date Value Ref Range Status  06/18/2020 102 0 - 149 mg/dL Final          Passed - Valid encounter within last 12 months    Recent Outpatient Visits           2 months ago Essential hypertension   [facility] Jerrol Banana., MD   8 months ago Encounter for annual wellness visit (AWV) in Medicare patient   [facility] Jerrol Banana., MD   1 year ago Encounter for annual physical exam   [facility] Jerrol Banana., MD   2 years ago Anemia, unspecified type   [facility] Jerrol Banana., MD   3 years ago Encounter for annual physical exam   [facility] Jerrol Banana., MD       Future Appointments  In 1 month Jerrol Banana., MD [facility]

## 2021-03-08 NOTE — Telephone Encounter (Signed)
Pt requesting rx be sent to mail order [facility].

## 2021-03-08 NOTE — Telephone Encounter (Signed)
duplicate

## 2021-03-08 NOTE — Telephone Encounter (Signed)
Copied from [place] [phone]. Topic: General - Other >> Mar 08, 2021 11:01 AM Bayard Beaver wrote:   Reason for BPP:HKFEXMD wants to knw if she can be sent short supply of these medications, traMADol (ULTRAM) 50 MG tablet  and ezetimibe (ZETIA) 10 MG tablet, until she gets her mail order. She only has 7 left of zetimibe (ZETIA) 10 MG tablet, wants to know if she can pick this up locally sent to, [place], [facility]  Phone:  [phone] Fax:  [phone] Please cb to advise.

## 2021-03-08 NOTE — Telephone Encounter (Signed)
   Notes to clinic: Patient requesting a 90 day supply   Requested Prescriptions  Pending Prescriptions Disp Refills   ezetimibe (ZETIA) 10 MG tablet [Pharmacy Med Name: EZETIMIBE 10MG  TABLETS] 90 tablet     Sig: TAKE 1 TABLET(10 MG) BY MOUTH DAILY      Cardiovascular:  Antilipid - Sterol Transport Inhibitors Passed - 03/08/2021  2:21 PM      Passed - Total Cholesterol in normal range and within 360 days    Cholesterol, Total  Date Value Ref Range Status  06/18/2020 145 100 - 199 mg/dL Final          Passed - LDL in normal range and within 360 days    LDL Chol Calc (NIH)  Date Value Ref Range Status  06/18/2020 81 0 - 99 mg/dL Final          Passed - HDL in normal range and within 360 days    HDL  Date Value Ref Range Status  06/18/2020 45 >39 mg/dL Final          Passed - Triglycerides in normal range and within 360 days    Triglycerides  Date Value Ref Range Status  06/18/2020 102 0 - 149 mg/dL Final          Passed - Valid encounter within last 12 months    Recent Outpatient Visits           2 months ago Essential hypertension   [facility] Jerrol Banana., MD   8 months ago Encounter for annual wellness visit (AWV) in Medicare patient   [facility] Jerrol Banana., MD   1 year ago Encounter for annual physical exam   [facility] Jerrol Banana., MD   2 years ago Anemia, unspecified type   [facility] Jerrol Banana., MD   3 years ago Encounter for annual physical exam   [facility] Jerrol Banana., MD       Future Appointments             In 1 month Jerrol Banana., MD [facility] [place]

## 2021-03-08 NOTE — Telephone Encounter (Signed)
[facility] faxed refill request for the following medications:  amLODipine (NORVASC) 5 MG tablet  Last Rx: 12/28/20 Qty: 30 Refills: 3 LOV: 12/28/20 NOV: 04/08/21 Please advise. Thanks TNP

## 2021-03-08 NOTE — Telephone Encounter (Signed)
Rx sent to pharmacy   

## 2021-03-08 NOTE — Telephone Encounter (Signed)
Medication Refill - Medication: traMADol (ULTRAM) 50 MG tablet and ezetimibe (ZETIA) 10 MG tablet  Has the patient contacted their pharmacy? Yes (Agent: If no, request that the patient contact the pharmacy for the refill.) (Agent: If yes, when and what did the pharmacy advise?)patient was told the at Dr cancelled the prescription orders. I have no record of that.  Preferred Pharmacy (with phone number or street name):  [facility] - [place], [place] Phone:  [phone]  Fax:  [phone]     Agent: Please be advised that RX refills may take up to 3 business days. We ask that you follow-up with your pharmacy.

## 2021-03-09 MED ORDER — TRAMADOL HCL 50 MG PO TABS: 50.0000 mg | ORAL_TABLET | Freq: Four times a day (QID) | ORAL | 1 refills | Status: DC | PRN

## 2021-03-16 DIAGNOSIS — D0512 Intraductal carcinoma in situ of left breast: Secondary | ICD-10-CM | POA: Diagnosis not present

## 2021-03-25 ENCOUNTER — Encounter: Payer: Self-pay | Admitting: Obstetrics & Gynecology

## 2021-03-25 ENCOUNTER — Ambulatory Visit: Payer: Medicare PPO | Admitting: Obstetrics & Gynecology

## 2021-03-25 ENCOUNTER — Other Ambulatory Visit: Payer: Self-pay

## 2021-03-25 VITALS — BP 140/80 | Ht 62.5 in | Wt 176.0 lb

## 2021-03-25 DIAGNOSIS — N993 Prolapse of vaginal vault after hysterectomy: Secondary | ICD-10-CM | POA: Diagnosis not present

## 2021-03-25 DIAGNOSIS — N3281 Overactive bladder: Secondary | ICD-10-CM | POA: Diagnosis not present

## 2021-03-25 DIAGNOSIS — N8111 Cystocele, midline: Secondary | ICD-10-CM | POA: Diagnosis not present

## 2021-03-25 DIAGNOSIS — R3915 Urgency of urination: Secondary | ICD-10-CM | POA: Diagnosis not present

## 2021-03-25 MED ORDER — TOLTERODINE TARTRATE ER 4 MG PO CP24: 4.0000 mg | ORAL_CAPSULE | Freq: Every day | ORAL | 3 refills | Status: DC

## 2021-03-25 NOTE — Patient Instructions (Signed)
Thank you for choosing [facility]. As part of our ongoing efforts to improve patient experience, we would appreciate your feedback. Please fill out the short survey that you will receive by mail or MyChart. Your opinion is important to us! -Dr Willie Plain  

## 2021-03-25 NOTE — Progress Notes (Signed)
HPI:      Claire Kane is a 80 y.o. [id number] who presents today for her pessary follow up and examination related to her pelvic floor weakening.  Pt reports tolerating the pessary well with no vaginal bleeding and no vaginal discharge.  Symptoms of pelvic floor weakening have greatly improved. She is voiding and defecating without difficulty. She currently has a #4 Cup type pessary.  Occas partial expulsion w BM straining.  Also has improved OAB sx's w daily dosing of Detrol, but still feels she could do better.  Nocturia 2+ times nightly. Urgency.  PMHx: She  has a past medical history of Anemia, Anxiety, Arthritis, BRCA negative (08/04/2015), Breast cancer (HCC) (2002), Breast cancer (HCC) (09/2020), Cataract, GERD (gastroesophageal reflux disease), Heart murmur (2001), Hiatal hernia, High cholesterol, Hypertension, Personal history of radiation therapy (2002, 2022), Rosacea, and 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 complications. Also,  has a past surgical history that includes Stab pheblectomy  (2010); vein closure procedure (Bilateral, 2008); Abdominal hysterectomy; Cholecystectomy; Colonoscopy (2013); post radiation therapy (Right); Colonoscopy with propofol (N/A, 12/08/2015); Esophagogastroduodenoscopy (egd) with propofol (N/A, 12/08/2015); Colonoscopy with propofol (N/A, 12/06/2017); Esophagogastroduodenoscopy (egd) with propofol (N/A, 12/06/2017); Colonoscopy with propofol (N/A, 12/22/2017); Esophagogastroduodenoscopy (egd) with propofol (N/A, 12/22/2017); Givens capsule study (N/A, 01/23/2018); Blepharoplasty; Breast excisional biopsy (Right, 2002); Breast surgery (Right, 2002); Breast cyst aspiration (Left, 2003); Breast biopsy (Left, 09/15/2020); Eye surgery (Right, 2014); Eye surgery (Left, 2014); iron infusion; Breast lumpectomy with needle localization (Left, 10/07/2020); Breast lumpectomy (Right, 2002); Breast lumpectomy (Left, 10/07/2020); Breast lumpectomy (Left); Cataract extraction w/  intraocular lens  implant, bilateral; and Re-excision of breast cancer,superior margins (Left, 11/09/2020)., family history includes Breast cancer in her cousin; Breast cancer (age of onset: 79) in an other family member; Breast cancer (age of onset: 47) in her cousin; Breast cancer (age of onset: 16) in her maternal aunt; Cancer (age of onset: 44) in her cousin; Heart attack in her brother, father, mother, and sister; Other in her brother; Stroke in her sister.,  reports that she has never smoked. She has never used smokeless tobacco. She reports that she does not drink alcohol and does not use drugs.  She has a current medication list which includes the following prescription(s): amlodipine, anastrozole, atorvastatin, calcium citrate, docusate sodium, ezetimibe, ferrous sulfate, glucosamine, glycerin-polysorbate 80, multivitamin with minerals, omeprazole, psyllium, soolantra, spironolactone, tolterodine, and tramadol. Also, has No Known Allergies.  Review of Systems  All other systems reviewed and are negative.  Objective: BP 140/80   Ht 5' 2.5" (1.588 m)   Wt 176 lb (79.8 kg)   BMI 31.68 kg/m  Physical Exam Constitutional:      General: She is not in acute distress.    Appearance: She is well-developed.  Genitourinary:     Bladder and urethral meatus normal.     Genitourinary Comments: Cuff intact/ no lesions  Absent uterus and cervix     No vaginal erythema or bleeding.     Anterior and apical vaginal prolapse present.    Mild vaginal atrophy present.    Pelvic exam was performed with patient in the lithotomy position.  HENT:     Head: Normocephalic and atraumatic.     Nose: Nose normal.  Abdominal:     General: There is no distension.     Palpations: Abdomen is soft.     Tenderness: There is no abdominal tenderness.  Musculoskeletal:        General: Normal range of motion.  Neurological:  Mental Status: She is alert and oriented to person, place, and time.     Cranial  Nerves: No cranial nerve deficit.  Skin:    General: Skin is warm and dry.  Psychiatric:        Attention and Perception: Attention normal.        Mood and Affect: Mood normal.        Speech: Speech normal.        Behavior: Behavior normal.        Cognition and Memory: Cognition normal.        Judgment: Judgment normal.    Pessary Care Pessary removed and cleaned.  Vagina checked - without erosions - pessary replaced.  A/P:   ICD-10-CM   1. 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 Cystocele, midline  N81.11     3. Urinary urgency  R39.15     4. Overactive bladder  N32.81     Pessary was cleaned and replaced today. Instructions given for care. Concerning symptoms to observe for are counseled to patient. Follow up scheduled for 3 months.  Change dose of Detrol to 4 mg daily.  Assess for side effects.  A total of 20 minutes were spent face-to-face with the patient as well as preparation, review, communication, and documentation during this encounter.   Barnett Applebaum, MD, [facility], [facility] 03/25/2021  2:08 PM

## 2021-03-30 DIAGNOSIS — E7849 Other hyperlipidemia: Secondary | ICD-10-CM | POA: Diagnosis not present

## 2021-03-30 DIAGNOSIS — R7303 Prediabetes: Secondary | ICD-10-CM | POA: Diagnosis not present

## 2021-03-30 DIAGNOSIS — I10 Essential (primary) hypertension: Secondary | ICD-10-CM | POA: Diagnosis not present

## 2021-03-31 LAB — COMPREHENSIVE METABOLIC PANEL
ALT: 13 IU/L (ref 0–32)
AST: 12 IU/L (ref 0–40)
Albumin/Globulin Ratio: 2.3 — ABNORMAL HIGH (ref 1.2–2.2)
Albumin: 4.8 g/dL — ABNORMAL HIGH (ref 3.7–4.7)
Alkaline Phosphatase: 76 IU/L (ref 44–121)
BUN/Creatinine Ratio: 13 (ref 12–28)
BUN: 9 mg/dL (ref 8–27)
Bilirubin Total: 1 mg/dL (ref 0.0–1.2)
CO2: 25 mmol/L (ref 20–29)
Calcium: 9.2 mg/dL (ref 8.7–10.3)
Chloride: 102 mmol/L (ref 96–106)
Creatinine, Ser: 0.72 mg/dL (ref 0.57–1.00)
Globulin, Total: 2.1 g/dL (ref 1.5–4.5)
Glucose: 107 mg/dL — ABNORMAL HIGH (ref 65–99)
Potassium: 4.2 mmol/L (ref 3.5–5.2)
Sodium: 142 mmol/L (ref 134–144)
Total Protein: 6.9 g/dL (ref 6.0–8.5)
eGFR: 85 mL/min/{1.73_m2} (ref >59)

## 2021-03-31 LAB — LIPID PANEL
Chol/HDL Ratio: 2.6 ratio (ref 0.0–4.4)
Cholesterol, Total: 123 mg/dL (ref 100–199)
HDL: 47 mg/dL (ref >39)
LDL Chol Calc (NIH): 60 mg/dL (ref 0–99)
Triglycerides: 84 mg/dL (ref 0–149)
VLDL Cholesterol Cal: 16 mg/dL (ref 5–40)

## 2021-03-31 LAB — HEMOGLOBIN A1C
Est. average glucose Bld gHb Est-mCnc: 128 mg/dL
Hgb A1c MFr Bld: 6.1 % — ABNORMAL HIGH (ref 4.8–5.6)

## 2021-04-08 ENCOUNTER — Ambulatory Visit: Payer: Medicare PPO | Admitting: Family Medicine

## 2021-04-08 ENCOUNTER — Encounter: Payer: Self-pay | Admitting: Family Medicine

## 2021-04-08 ENCOUNTER — Other Ambulatory Visit: Payer: Self-pay

## 2021-04-08 VITALS — BP 149/78 | HR 74 | Temp 98.6°F | Resp 18 | Ht 63.0 in | Wt 179.0 lb

## 2021-04-08 DIAGNOSIS — I10 Essential (primary) hypertension: Secondary | ICD-10-CM

## 2021-04-08 DIAGNOSIS — R7303 Prediabetes: Secondary | ICD-10-CM | POA: Diagnosis not present

## 2021-04-08 DIAGNOSIS — I8390 Asymptomatic varicose veins of unspecified lower extremity: Secondary | ICD-10-CM | POA: Diagnosis not present

## 2021-04-08 DIAGNOSIS — E7849 Other hyperlipidemia: Secondary | ICD-10-CM | POA: Diagnosis not present

## 2021-04-08 DIAGNOSIS — Z853 Personal history of malignant neoplasm of breast: Secondary | ICD-10-CM | POA: Diagnosis not present

## 2021-04-08 NOTE — Progress Notes (Signed)
I,April Miller,acting as a scribe for Wilhemena Durie, MD.,have documented all relevant documentation on the behalf of Wilhemena Durie, MD,as directed by  Wilhemena Durie, MD while in the presence of Wilhemena Durie, MD.  Established patient visit   Patient: Claire Kane   DOB: [birth year]   80 y.o. Female  MRN: [id number] Visit Date: 04/08/2021  Today's healthcare provider: Wilhemena Durie, MD   Chief Complaint  Patient presents with   Follow-up   Hypertension   Hyperlipidemia   Prediabetes   Subjective    HPI  Patient comes in today for follow-up.  She is under increased stress as her husband had vascular dementia.  She had her second COVID booster at the end of April. She is having trouble losing weight.  See states her blood pressures at home runs 130s over 70s.  Her other concern today is 1 of varicose veins that are getting larger and becoming uncomfortable. She is taking medications as prescribed and has no other issues  Hypertension, follow-up  BP Readings from Last 3 Encounters:  04/08/21 (!) 149/78  03/25/21 140/80  02/22/21 137/64   Wt Readings from Last 3 Encounters:  04/08/21 179 lb (81.2 kg)  03/25/21 176 lb (79.8 kg)  02/17/21 178 lb 4.8 oz (80.9 kg)     She was last seen for hypertension 3 months ago.  BP at that visit was 156/71. Management since that visit includes; Added amlodipine 5 mg daily. She reports good compliance with treatment. She is not having side effects. none  She is exercising. She is adherent to low salt diet.   Outside blood pressures are 137/77.  She does not smoke.  Use of agents associated with hypertension: none.   ----------------------------------------------------------------------------  Lipid/Cholesterol, follow-up  Last Lipid Panel: Lab Results  Component Value Date   CHOL 123 03/30/2021   LDLCALC 60 03/30/2021   HDL 47 03/30/2021   TRIG 84 03/30/2021    She was last seen for this 3  months ago.  Management since that visit includes; labs checked 712/2022.  She reports good compliance with treatment. She is not having side effects. none  She is following a Regular diet. Current exercise:  works out to [employer], water  Last metabolic panel Lab Results  Component Value Date   GLUCOSE 107 (H) 03/30/2021   NA 142 03/30/2021   K 4.2 03/30/2021   BUN 9 03/30/2021   CREATININE 0.72 03/30/2021   GFRNONAA >60 02/17/2021   GFRAA 79 06/18/2020   CALCIUM 9.2 03/30/2021   AST 12 03/30/2021   ALT 13 03/30/2021   The ASCVD Risk score (Goff DC Jr., et al., 2013) failed to calculate for the following reasons:   The valid total cholesterol range is 130 to 320 mg/dL  ----------------------------------------------------------------------------  Prediabetes, Follow-up  Lab Results  Component Value Date   HGBA1C 6.1 (H) 03/30/2021   HGBA1C 6.0 (A) 12/28/2020   HGBA1C 5.9 (H) 06/18/2020   GLUCOSE 107 (H) 03/30/2021   GLUCOSE 103 (H) 02/17/2021   GLUCOSE 100 (H) 09/30/2020    Last seen for for this3 months ago.  Management since that visit includes; labs checked 03/30/2021. Current symptoms include none and have been unchanged.  Prior visit with dietician: no Current diet: well balanced Current exercise: works out to [employer], water  Pertinent Labs:    Component Value Date/Time   CHOL 123 03/30/2021 0905   TRIG 84 03/30/2021 0905   CHOLHDL 2.6 03/30/2021 0905  CREATININE 0.72 03/30/2021 0905    Wt Readings from Last 3 Encounters:  04/08/21 179 lb (81.2 kg)  03/25/21 176 lb (79.8 kg)  02/17/21 178 lb 4.8 oz (80.9 kg)    ----------------------------------------------------------------------------       Medications: Outpatient Medications Prior to Visit  Medication Sig   amLODipine (NORVASC) 5 MG tablet Take 1 tablet (5 mg total) by mouth daily.   anastrozole (ARIMIDEX) 1 MG tablet Take 1 tablet (1 mg total) by mouth daily.   atorvastatin  (LIPITOR) 10 MG tablet Take 1 tablet (10 mg total) by mouth daily.   calcium citrate (CALCITRATE - DOSED IN MG ELEMENTAL CALCIUM) 950 MG tablet Take 200 mg of elemental calcium by mouth 2 (two) times daily.    docusate sodium (COLACE) 100 MG capsule Take 100 mg by mouth daily as needed for mild constipation.   ezetimibe (ZETIA) 10 MG tablet TAKE 1 TABLET(10 MG) BY MOUTH DAILY   Glucosamine 750 MG TABS Take 750 mg by mouth daily.   Glycerin-Polysorbate 80 (REFRESH DRY EYE THERAPY OP) Place 1 drop into both eyes 2 (two) times daily.   Multiple Vitamins-Minerals (MULTIVITAMIN WITH MINERALS) tablet Take 1 tablet by mouth daily.   omeprazole (PRILOSEC) 20 MG capsule Take 1 capsule (20 mg total) by mouth daily.   psyllium (REGULOID) 0.52 G capsule Take 2 capsules by mouth daily.   SOOLANTRA 1 % CREA Apply 1 application topically daily as needed (rosacea).   spironolactone (ALDACTONE) 50 MG tablet Take 50 mg by mouth daily.   tolterodine (DETROL LA) 4 MG 24 hr capsule Take 1 capsule (4 mg total) by mouth daily.   traMADol (ULTRAM) 50 MG tablet Take 1 tablet (50 mg total) by mouth every 6 (six) hours as needed for severe pain.   ferrous sulfate 325 (65 FE) MG EC tablet Take 1 tablet (325 mg total) by mouth 2 (two) times daily with a meal. (Patient not taking: Reported on 04/08/2021)   No facility-administered medications prior to visit.    Review of Systems  Constitutional:  Negative for appetite change, chills, fatigue and fever.  Respiratory:  Negative for chest tightness and shortness of breath.   Cardiovascular:  Negative for chest pain and palpitations.  Gastrointestinal:  Negative for abdominal pain, nausea and vomiting.  Neurological:  Negative for dizziness and weakness.       Objective    BP (!) 149/78 (BP Location: Left Arm, Patient Position: Sitting, Cuff Size: Large)   Pulse 74   Temp 98.6 F (37 C) (Oral)   Resp 18   Ht 5\' 3"  (1.6 m)   Wt 179 lb (81.2 kg)   SpO2 99%   BMI  31.71 kg/m  BP Readings from Last 3 Encounters:  04/08/21 (!) 149/78  03/25/21 140/80  02/22/21 137/64   Wt Readings from Last 3 Encounters:  04/08/21 179 lb (81.2 kg)  03/25/21 176 lb (79.8 kg)  02/17/21 178 lb 4.8 oz (80.9 kg)       Physical Exam Vitals reviewed.  Constitutional:      Appearance: She is well-developed.  HENT:     Head: Normocephalic and atraumatic.     Right Ear: External ear normal.     Left Ear: External ear normal.     Nose: Nose normal.  Eyes:     General: No scleral icterus.    Conjunctiva/sclera: Conjunctivae normal.  Neck:     Thyroid: No thyromegaly.  Cardiovascular:     Rate and Rhythm: Normal rate  and regular rhythm.     Heart sounds: Normal heart sounds.     Comments: Spider varicosities and varicose veins of both lower extremities. Pulmonary:     Effort: Pulmonary effort is normal.     Breath sounds: Normal breath sounds.  Abdominal:     Palpations: Abdomen is soft.  Lymphadenopathy:     Cervical: No cervical adenopathy.  Skin:    General: Skin is warm and dry.  Neurological:     General: No focal deficit present.     Mental Status: She is alert and oriented to person, place, and time.  Psychiatric:        Mood and Affect: Mood normal.        Behavior: Behavior normal.        Thought Content: Thought content normal.        Judgment: Judgment normal.      No results found for any visits on 04/08/21.  Assessment & Plan     1. Essential hypertension Good control with home readings in 130s over 70s   2. Other hyperlipidemia On atorvastatin 10  3. Borderline diabetes mellitus Follow-up A1c and treat as appropriate.  Presently diet and exercise treated.  4. Varicose veins of lower extremity, unspecified laterality, unspecified whether complicated Now becoming uncomfortable. - Ambulatory referral to Vascular Surgery  5. breast cancer, DCIS right breast, 2002 On Arimidex.   Return in about 17 weeks (around 08/05/2021).       I, Wilhemena Durie, MD, have reviewed all documentation for this visit. The documentation on 04/13/21 for the exam, diagnosis, procedures, and orders are all accurate and complete.    Gudrun Axe Cranford Mon, MD  [facility] [phone] (phone) [phone] (fax)  [place]

## 2021-04-08 NOTE — Patient Instructions (Signed)
Try Unsweetened Tea.

## 2021-04-08 NOTE — Assessment & Plan Note (Signed)
IRON SUCROSE (VENOFER) Q7D & ZOLEDRONIC ACID (ZOMETA) Q28D

## 2021-04-21 ENCOUNTER — Other Ambulatory Visit: Payer: Self-pay | Admitting: Family Medicine

## 2021-04-21 NOTE — Telephone Encounter (Signed)
Current Rx should last pt until December 2022.

## 2021-04-22 NOTE — Telephone Encounter (Signed)
Last ordered 03/08/21 #90 tabs with one refill.TC to pharmacy. The patient has one refill on file to be picked up. Refusing request as not needed at this time.

## 2021-05-17 ENCOUNTER — Other Ambulatory Visit (INDEPENDENT_AMBULATORY_CARE_PROVIDER_SITE_OTHER): Payer: Self-pay | Admitting: Nurse Practitioner

## 2021-05-17 DIAGNOSIS — I8390 Asymptomatic varicose veins of unspecified lower extremity: Secondary | ICD-10-CM

## 2021-05-18 ENCOUNTER — Ambulatory Visit (INDEPENDENT_AMBULATORY_CARE_PROVIDER_SITE_OTHER): Payer: Medicare PPO | Admitting: Nurse Practitioner

## 2021-05-18 ENCOUNTER — Ambulatory Visit (INDEPENDENT_AMBULATORY_CARE_PROVIDER_SITE_OTHER): Payer: Medicare PPO

## 2021-05-18 ENCOUNTER — Other Ambulatory Visit: Payer: Self-pay

## 2021-05-18 VITALS — BP 159/81 | HR 91 | Ht 62.0 in | Wt 182.0 lb

## 2021-05-18 DIAGNOSIS — M79604 Pain in right leg: Secondary | ICD-10-CM

## 2021-05-18 DIAGNOSIS — I8390 Asymptomatic varicose veins of unspecified lower extremity: Secondary | ICD-10-CM

## 2021-05-18 DIAGNOSIS — M79605 Pain in left leg: Secondary | ICD-10-CM

## 2021-05-18 DIAGNOSIS — E785 Hyperlipidemia, unspecified: Secondary | ICD-10-CM

## 2021-05-19 ENCOUNTER — Inpatient Hospital Stay: Payer: Medicare PPO | Attending: Oncology

## 2021-05-19 ENCOUNTER — Encounter: Payer: Self-pay | Admitting: Oncology

## 2021-05-19 ENCOUNTER — Inpatient Hospital Stay: Payer: Medicare PPO | Admitting: Oncology

## 2021-05-19 VITALS — BP 148/76 | HR 80 | Temp 97.9°F | Resp 18 | Wt 182.0 lb

## 2021-05-19 DIAGNOSIS — M2550 Pain in unspecified joint: Secondary | ICD-10-CM

## 2021-05-19 DIAGNOSIS — M8580 Other specified disorders of bone density and structure, unspecified site: Secondary | ICD-10-CM | POA: Insufficient documentation

## 2021-05-19 DIAGNOSIS — Z170 Estrogen receptor positive status [ER+]: Secondary | ICD-10-CM | POA: Insufficient documentation

## 2021-05-19 DIAGNOSIS — D509 Iron deficiency anemia, unspecified: Secondary | ICD-10-CM | POA: Diagnosis not present

## 2021-05-19 DIAGNOSIS — D0512 Intraductal carcinoma in situ of left breast: Secondary | ICD-10-CM

## 2021-05-19 DIAGNOSIS — Z79811 Long term (current) use of aromatase inhibitors: Secondary | ICD-10-CM | POA: Diagnosis not present

## 2021-05-19 LAB — COMPREHENSIVE METABOLIC PANEL
ALT: 18 U/L (ref 0–44)
AST: 19 U/L (ref 15–41)
Albumin: 4.2 g/dL (ref 3.5–5.0)
Alkaline Phosphatase: 47 U/L (ref 38–126)
Anion gap: 6 (ref 5–15)
BUN: 12 mg/dL (ref 8–23)
CO2: 30 mmol/L (ref 22–32)
Calcium: 8.9 mg/dL (ref 8.9–10.3)
Chloride: 100 mmol/L (ref 98–111)
Creatinine, Ser: 0.74 mg/dL (ref 0.44–1.00)
GFR, Estimated: >60 mL/min (ref >60)
Glucose, Bld: 102 mg/dL — ABNORMAL HIGH (ref 70–99)
Potassium: 4.3 mmol/L (ref 3.5–5.1)
Sodium: 136 mmol/L (ref 135–145)
Total Bilirubin: 0.7 mg/dL (ref 0.3–1.2)
Total Protein: 7.1 g/dL (ref 6.5–8.1)

## 2021-05-19 LAB — CBC WITH DIFFERENTIAL/PLATELET
Abs Immature Granulocytes: 0.02 10*3/uL (ref 0.00–0.07)
Basophils Absolute: 0.1 10*3/uL (ref 0.0–0.1)
Basophils Relative: 1 %
Eosinophils Absolute: 0.2 10*3/uL (ref 0.0–0.5)
Eosinophils Relative: 3 %
HCT: 39.9 % (ref 36.0–46.0)
Hemoglobin: 13.1 g/dL (ref 12.0–15.0)
Immature Granulocytes: 0 %
Lymphocytes Relative: 26 %
Lymphs Abs: 1.9 10*3/uL (ref 0.7–4.0)
MCH: 30 pg (ref 26.0–34.0)
MCHC: 32.8 g/dL (ref 30.0–36.0)
MCV: 91.3 fL (ref 80.0–100.0)
Monocytes Absolute: 0.8 10*3/uL (ref 0.1–1.0)
Monocytes Relative: 10 %
Neutro Abs: 4.5 10*3/uL (ref 1.7–7.7)
Neutrophils Relative %: 60 %
Platelets: 327 10*3/uL (ref 150–400)
RBC: 4.37 MIL/uL (ref 3.87–5.11)
RDW: 13 % (ref 11.5–15.5)
WBC: 7.6 10*3/uL (ref 4.0–10.5)
nRBC: 0 % (ref 0.0–0.2)

## 2021-05-19 NOTE — Progress Notes (Signed)
Hematology/Oncology follow up  note [facility] Telephone:([phone]) [phone] Fax:([phone]   Patient Care Team: Jerrol Banana., MD as PCP - General (Family Medicine) Dingeldein, Remo Lipps, MD as Consulting Physician (Ophthalmology) Gae Dry, MD as Referring Physician (Obstetrics and Gynecology) Earlie Server, MD as Consulting Physician (Oncology) Thomes Dinning, MD as Referring Physician (Internal Medicine)  REFERRING PROVIDER: Nilsa Nutting REASON FOR VISIT Follow-up for DCIS  HISTORY OF PRESENTING ILLNESS:  Claire Kane is a  80 y.o.  female with PMH listed below who was referred to me for evaluation of anemia.  Patient reports she was told that she had iron deficiency 2 years ago and she took iron supplementation for a while and was told to stop. Recently found to to have worsening of anemia. She had Upper and lower endoscopy in 2017 by Dr.Sankar # Upper endoscopy 12/07/2017 Normal examined duodenum. - Large hiatal hernia. - Erythematous mucosa in the antrum. Biopsied. - The examination was otherwise normal # Colonoscopy on 12/07/2017  Non-thrombosed external hemorrhoids found on perianal exam. - Diverticulosis in the sigmoid colon and in the descending colon. - The examination was otherwise normal on direct and retroflexion views. - No specimens collected. #  capsule study which did not reveal any active bleeding source.  # Remote history of DCIS right breast in  2002, s/p surgery and RT. Finished 5 years of tamoxifen.   # April - May s/p upper and lower endoscopy, capsule study which did not reveal active bleeding source  Celiac panel positive.  # iron deficiency anemia.  Previously she has received IV Venofer. Patient has previously underwent extensive gastroenterology work-up including EGD, colonoscopy, capsule study which did not reveal active bleeding source.  #08/18/2020, screening mammogram showed left breast calcification. 09/07/2020,  diagnostic left mammogram showed an area of 1.4 cm group of calcifications in the upper outer left breast.  Patient underwent breast biopsy, pathology came back positive for high-grade DCIS, focal necrosis, associated with calcifications.  ER status deferred to existing specimen.  # #10/07/2020, status post left DCIS lumpectomy by Dr. Bary Castilla Pathology showed residual high-grade DCIS with associated calcifications.  Extent of DCIS at least 12 mm, grade 3, central necrosis, anterior and inferior margins were unifocal in positive for DCIS pTis, ER positive.  11/09/2020 reexcision showed no residual DCIS or invasive carcinoma. 01/04/2021, adjuvant breast radiation  Patient reports family history of VTE daughter who was tested positive for mutation. Significant family history of cardiovascular disease, familiar hypercholesterolemia. Parents, sister, brother passed away from heart attack.  01/08/2021, started on Arimidex.   Interval History 80 year old female with pertinent hematology history listed above reviewed by me today presents to reestablish care for left breast DCIS.   Patient has been on Arimidex.  She tolerates well She received 1 dose of Zometa for osteopenia.  She did not tolerate due to worsening of body aches and joint pain for 3 days. Patient reports overall increased wrist and knee pain.  Patient has osteoarthritis.  Constipation.    Review of Systems  Constitutional:  Negative for chills, diaphoresis, fever, malaise/fatigue and weight loss.  HENT:  Negative for ear discharge, nosebleeds and sore throat.   Eyes:  Negative for double vision, photophobia, discharge and redness.  Respiratory:  Negative for cough, hemoptysis, sputum production, shortness of breath and wheezing.   Cardiovascular:  Negative for chest pain, palpitations, orthopnea, claudication and leg swelling.  Gastrointestinal:  Positive for constipation. Negative for abdominal pain, blood in stool, heartburn, nausea  and vomiting.  Genitourinary:  Negative for dysuria and urgency.  Musculoskeletal:  Positive for joint pain. Negative for back pain and neck pain.  Skin:  Negative for itching and rash.  Neurological:  Negative for dizziness, tingling, tremors and sensory change.  Endo/Heme/Allergies:  Negative for environmental allergies. Does not bruise/bleed easily.  Psychiatric/Behavioral:  Negative for depression, hallucinations, substance abuse and suicidal ideas.    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) 2002   DCIS right breast    Breast cancer ([place]) 09/2020   left breast   Cataract    GERD (gastroesophageal reflux disease)    Heart murmur 2001   Hiatal hernia    High cholesterol    Hypertension    patient denies anyone telling her that she has htn   Personal history of radiation therapy 2002, 2022   BREAST CA   Rosacea    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 complications     SURGICAL HISTORY: Past Surgical History:  Procedure Laterality Date   ABDOMINAL HYSTERECTOMY     BLEPHAROPLASTY     BREAST BIOPSY Left 09/15/2020   affirm bx, x marker, DCIS   BREAST CYST ASPIRATION Left 2003   BREAST EXCISIONAL BIOPSY Right 2002   POS   BREAST LUMPECTOMY Right 2002   w/ radiation   BREAST LUMPECTOMY Left 10/07/2020   BREAST LUMPECTOMY Left    re-excision   BREAST LUMPECTOMY WITH NEEDLE LOCALIZATION Left 10/07/2020   Procedure: BREAST LUMPECTOMY WITH NEEDLE LOCALIZATION;  Surgeon: Robert Bellow, MD;  Location: [facility] ORS;  Service: General;  Laterality: Left;   BREAST SURGERY Right 2002   lumpectomy   CATARACT EXTRACTION W/ INTRAOCULAR LENS  IMPLANT, BILATERAL     CHOLECYSTECTOMY     COLONOSCOPY  2013   COLONOSCOPY WITH PROPOFOL N/A 12/08/2015   Procedure: COLONOSCOPY WITH PROPOFOL;  Surgeon: Christene Lye,  MD;  Location: [facility] ENDOSCOPY;  Service: Endoscopy;  Laterality: N/A;   COLONOSCOPY WITH PROPOFOL N/A 12/06/2017   Procedure: COLONOSCOPY WITH PROPOFOL;  Surgeon: Jonathon Bellows, MD;  Location[facility] ENDOSCOPY;  Service: Gastroenterology;  Laterality: N/A;   COLONOSCOPY WITH PROPOFOL N/A 12/22/2017   Procedure: COLONOSCOPY WITH PROPOFOL;  Surgeon: Jonathon Bellows, MD;  Location: [facility] ENDOSCOPY;  Service: Gastroenterology;  Laterality: N/A;   ESOPHAGOGASTRODUODENOSCOPY (EGD) WITH PROPOFOL N/A 12/08/2015   Procedure: ESOPHAGOGASTRODUODENOSCOPY (EGD) WITH PROPOFOL;  Surgeon: Christene Lye, MD;  Location: [facility] ENDOSCOPY;  Service: Endoscopy;  Laterality: N/A;   ESOPHAGOGASTRODUODENOSCOPY (EGD) WITH PROPOFOL N/A 12/06/2017   Procedure: ESOPHAGOGASTRODUODENOSCOPY (EGD) WITH PROPOFOL;  Surgeon: Jonathon Bellows, MD;  Location: [facility] ENDOSCOPY;  Service: Gastroenterology;  Laterality: N/A;   ESOPHAGOGASTRODUODENOSCOPY (EGD) WITH PROPOFOL N/A 12/22/2017   Procedure: ESOPHAGOGASTRODUODENOSCOPY (EGD) WITH PROPOFOL;  Surgeon: Jonathon Bellows, MD;  Location: [facility] ENDOSCOPY;  Service: Gastroenterology;  Laterality: N/A;   EYE SURGERY Right 2014   EYE SURGERY Left 2014   GIVENS CAPSULE STUDY N/A 01/23/2018   Procedure: GIVENS CAPSULE STUDY;  Surgeon: Jonathon Bellows, MD;  Location: [facility] ENDOSCOPY;  Service: Gastroenterology;  Laterality: N/A;   iron infusion     had 5 iron infusions for anemia   post radiation therapy Right    right breast cancer 2002   RE-EXCISION OF BREAST CANCER,SUPERIOR MARGINS Left 11/09/2020   Procedure: RE-EXCISION OF  BREAST CANCER,SUPERIOR MARGINS;  Surgeon: Robert Bellow, MD;  Location: [facility] ORS;  Service: General;  Laterality: Left;   Stab pheblectomy   2010   vein closure procedure Bilateral 2008    SOCIAL HISTORY: Social History   Socioeconomic History   Marital status: Married    Spouse name: Fritz Pickerel   Number of children: 2   Years of education: Not on file   Highest education level:  Bachelor's degree (e.g., BA, AB, BS)  Occupational History   Occupation: Pharmacist, hospital    Comment: retired  Tobacco Use   Smoking status: Never   Smokeless tobacco: Never  Vaping Use   Vaping Use: Never used  Substance and Sexual Activity   Alcohol use: No   Drug use: No   Sexual activity: Never  Other Topics Concern   Not on file  Social History Narrative   Patient lives with husband. She feels safe in her home.   Social Determinants of Health   Financial Resource Strain: Not on file  Food Insecurity: Not on file  Transportation Needs: Not on file  Physical Activity: Not on file  Stress: Not on file  Social Connections: Not on file  Intimate Partner Violence: Not on file    FAMILY HISTORY: Family History  Problem Relation Age of Onset   Heart attack Mother    Heart attack Father    Heart attack Sister    Stroke Sister    Heart attack Brother    Other Brother        cerebral hemorrhage, sepsis   Breast cancer Cousin 94       breast/maternal   Cancer Cousin 58       breast/maternal   Breast cancer Cousin    Breast cancer Maternal Aunt 70   Breast cancer Other 48       maternal neice    ALLERGIES:  has No Known Allergies.  MEDICATIONS:  Current Outpatient Medications  Medication Sig Dispense Refill   amLODipine (NORVASC) 5 MG tablet Take 1 tablet (5 mg total) by mouth daily. 90 tablet 3   anastrozole (ARIMIDEX) 1 MG tablet Take 1 tablet (1 mg total) by mouth daily. 90 tablet 2   atorvastatin (LIPITOR) 10 MG tablet Take 1 tablet (10 mg total) by mouth daily. 90 tablet 3   calcium citrate (CALCITRATE - DOSED IN MG ELEMENTAL CALCIUM) 950 MG tablet Take 200 mg of elemental calcium by mouth 2 (two) times daily.      docusate sodium (COLACE) 100 MG capsule Take 100 mg by mouth daily as needed for mild constipation.     ezetimibe (ZETIA) 10 MG tablet TAKE 1 TABLET(10 MG) BY MOUTH DAILY 90 tablet 1   Glucosamine 750 MG TABS Take 750 mg by mouth daily.      Glycerin-Polysorbate 80 (REFRESH DRY EYE THERAPY OP) Place 1 drop into both eyes 2 (two) times daily.     Multiple Vitamins-Minerals (MULTIVITAMIN WITH MINERALS) tablet Take 1 tablet by mouth daily.     omeprazole (PRILOSEC) 20 MG capsule Take 1 capsule (20 mg total) by mouth daily. 90 capsule 3   psyllium (REGULOID) 0.52 G capsule Take 2 capsules by mouth daily.     SOOLANTRA 1 % CREA Apply 1 application topically daily as needed (rosacea).     spironolactone (ALDACTONE) 50 MG tablet Take 50 mg by mouth daily.     tolterodine (DETROL LA) 4 MG 24 hr capsule Take 1 capsule (4 mg total) by mouth daily. 90 capsule  3   traMADol (ULTRAM) 50 MG tablet Take 1 tablet (50 mg total) by mouth every 6 (six) hours as needed for severe pain. 100 tablet 1   ferrous sulfate 325 (65 FE) MG EC tablet Take 1 tablet (325 mg total) by mouth 2 (two) times daily with a meal. (Patient not taking: No sig reported) 180 tablet 1   No current facility-administered medications for this visit.     PHYSICAL EXAMINATION: ECOG PERFORMANCE STATUS: 0 - Asymptomatic Vitals:   05/19/21 1404  BP: (!) 148/76  Pulse: 80  Resp: 18  Temp: 97.9 F (36.6 C)  SpO2: 100%   Filed Weights   05/19/21 1404  Weight: 182 lb (82.6 kg)    Physical Exam Constitutional:      General: She is not in acute distress.    Appearance: She is not diaphoretic.     Comments: Obese  HENT:     Head: Normocephalic and atraumatic.     Nose: Nose normal.     Mouth/Throat:     Pharynx: No oropharyngeal exudate.  Eyes:     General: No scleral icterus.       Right eye: No discharge.        Left eye: No discharge.     Conjunctiva/sclera: Conjunctivae normal.     Pupils: Pupils are equal, round, and reactive to light.  Cardiovascular:     Rate and Rhythm: Normal rate and regular rhythm.     Heart sounds: Normal heart sounds. No murmur heard. Pulmonary:     Effort: Pulmonary effort is normal. No respiratory distress.     Breath sounds:  Normal breath sounds. No wheezing or rales.  Chest:     Chest wall: No tenderness.  Abdominal:     General: Bowel sounds are normal. There is no distension.     Palpations: Abdomen is soft. There is no mass.     Tenderness: There is no abdominal tenderness. There is no guarding.  Musculoskeletal:        General: Normal range of motion.     Cervical back: Normal range of motion and neck supple.  Lymphadenopathy:     Cervical: No cervical adenopathy.  Skin:    General: Skin is warm and dry.     Findings: No erythema.  Neurological:     Mental Status: She is alert and oriented to person, place, and time.     Cranial Nerves: No cranial nerve deficit.     Motor: No abnormal muscle tone.     Coordination: Coordination normal.  Psychiatric:        Mood and Affect: Affect normal.        Cognition and Memory: Memory normal.        Judgment: Judgment normal.     LABORATORY DATA:  I have reviewed the data as listed Lab Results  Component Value Date   WBC 7.6 05/19/2021   HGB 13.1 05/19/2021   HCT 39.9 05/19/2021   MCV 91.3 05/19/2021   PLT 327 05/19/2021   Recent Labs    06/18/20 0929 06/18/20 0929 09/24/20 1426 09/30/20 1132 02/17/21 1400 03/30/21 0905 05/19/21 1344  NA 146*   < >  --  140 137 142 136  K 4.6  --   --  3.9 4.1 4.2 4.3  CL 105  --   --  104 99 102 100  CO2 24  --   --  26 27 25 30   GLUCOSE 105*   < >  --  100* 103* 107* 102*  BUN 10   < >  --  13 18 9 12   CREATININE 0.82  --   --  0.80 0.80 0.72 0.74  CALCIUM 8.9  --   --  9.0 9.3 9.2 8.9  GFRNONAA 68  --   --  >60 >60  --  >60  GFRAA 79  --   --   --   --   --   --   PROT 6.9   < > 7.0  --  7.2 6.9 7.1  ALBUMIN 4.5   < > 4.2  --  4.2 4.8* 4.2  AST 17  --  17  --  18 12 19   ALT 18  --  15  --  15 13 18   ALKPHOS 75  --  56  --  71 76 47  BILITOT 1.8*   < > 1.1  --  1.1 1.0 0.7  BILIDIR  --   --  0.2  --   --   --   --   IBILI  --   --  0.9  --   --   --   --    < > = values in this interval not  displayed.     Iron/TIBC/Ferritin/ %Sat    Component Value Date/Time   IRON 73 09/24/2020 1426   IRON 184 (H) 06/18/2020 0929   TIBC 344 09/24/2020 1426   TIBC 314 06/10/2019 1029   FERRITIN 34 09/24/2020 1426   FERRITIN 109 06/10/2019 1029   IRONPCTSAT 21 09/24/2020 1426   IRONPCTSAT 37 06/10/2019 1029     ASSESSMENT & PLAN:  Patient is a 80 year old female present for management of left breast DCIS 1. Ductal carcinoma in situ (DCIS) of left breast   2. Osteopenia, unspecified location   3. Use of anastrozole (Arimidex)   4. Arthralgia, unspecified joint     #Left breast DCIS high grade. pTis, ER positive Status post left lumpectomy-margin positive status post reexcision achieve negative margin Status post adjuvant breast radiation. Labs reviewed and discussed with patient Continue Arimidex 1 mg daily.  Plan 5 years.  #Arthralgia  patient tolerates with mild difficulties due to arthralgia.  Recommend patient to continue tramadol as needed for pain. Recommend annual  mammogram due November 2022-patient will get mammograms scheduled.  Dr. Dwyane Luo office.  #Osteopenia 10-year probability of fracture 12.5% Major Osteoporotic Fracture 2.8% Recommend patient to continue calcium and vitamin D supplementation. She did not tolerate Zometa.  Plan to switch to [facility].  #Family history of VTE, cardiovascular disease. Patient is considering varicose vein surgery and is concerned about potential increase VTE risk. Recommend patient to take aspirin 81 mg daily.  All questions were answered. The patient knows to call the clinic with any problems questions or concerns.  Return of visit: 3 months, lab, CBC CMP, MD  Earlie Server, MD, PhD Hematology Oncology [facility] at [facility] Pager- [phone] 05/19/2021

## 2021-05-19 NOTE — Progress Notes (Signed)
Patient here for oncology follow-up appointment, of SOB, wrist/knee pain,constipation

## 2021-05-21 ENCOUNTER — Other Ambulatory Visit: Payer: Self-pay | Admitting: Family Medicine

## 2021-05-23 ENCOUNTER — Other Ambulatory Visit: Payer: Self-pay | Admitting: Family Medicine

## 2021-05-23 DIAGNOSIS — K30 Functional dyspepsia: Secondary | ICD-10-CM

## 2021-05-23 NOTE — Telephone Encounter (Signed)
Last RF #90 3 RF  Too soon

## 2021-05-25 DIAGNOSIS — G8929 Other chronic pain: Secondary | ICD-10-CM | POA: Diagnosis not present

## 2021-05-25 DIAGNOSIS — K219 Gastro-esophageal reflux disease without esophagitis: Secondary | ICD-10-CM | POA: Diagnosis not present

## 2021-05-25 DIAGNOSIS — H04129 Dry eye syndrome of unspecified lacrimal gland: Secondary | ICD-10-CM | POA: Diagnosis not present

## 2021-05-25 DIAGNOSIS — C50919 Malignant neoplasm of unspecified site of unspecified female breast: Secondary | ICD-10-CM | POA: Diagnosis not present

## 2021-05-25 DIAGNOSIS — I10 Essential (primary) hypertension: Secondary | ICD-10-CM | POA: Diagnosis not present

## 2021-05-25 DIAGNOSIS — E785 Hyperlipidemia, unspecified: Secondary | ICD-10-CM | POA: Diagnosis not present

## 2021-05-25 DIAGNOSIS — E669 Obesity, unspecified: Secondary | ICD-10-CM | POA: Diagnosis not present

## 2021-05-25 DIAGNOSIS — K5900 Constipation, unspecified: Secondary | ICD-10-CM | POA: Diagnosis not present

## 2021-05-25 DIAGNOSIS — L309 Dermatitis, unspecified: Secondary | ICD-10-CM | POA: Diagnosis not present

## 2021-05-29 ENCOUNTER — Encounter (INDEPENDENT_AMBULATORY_CARE_PROVIDER_SITE_OTHER): Payer: Self-pay | Admitting: Nurse Practitioner

## 2021-05-30 NOTE — Progress Notes (Signed)
Subjective:    Patient ID: Claire Kane, female    DOB: Jun 03, 1941, 80 y.o.   MRN: [id number] Chief Complaint  Patient presents with   New Patient (Initial Visit)    Gr. Berna Bue Dx VV of LE patient states mild pain    Marche Defino is a 80 year old female that presents today for evaluation of varicosities and pain.The patient relates burning and stinging which worsened steadily throughout the course of the day, particularly with standing. The patient also notes an aching and throbbing pain over the varicosities, particularly with prolonged dependent positions. The symptoms are significantly improved with elevation.  The patient also notes that during hot weather the symptoms are greatly intensified. The patient states the pain from the varicose veins interferes with work, daily exercise, shopping and household maintenance. At this point, the symptoms are persistent and severe enough that they're having a negative impact on lifestyle and are interfering with daily activities.  There is no history of DVT, PE or superficial thrombophlebitis. There is no history of ulceration or hemorrhage. The patient denies a significant family history of varicose veins. OB history: [id number]  The patient has not worn graduated compression in the past. At the present time the patient has not been using over-the-counter analgesics. The patient has had stab phlebectomy done years ago as well as sclerotherapy.     Review of Systems  Cardiovascular:  Positive for leg swelling.  All other systems reviewed and are negative.     Objective:   Physical Exam Vitals reviewed.  HENT:     Head: Normocephalic.  Cardiovascular:     Rate and Rhythm: Normal rate.     Pulses:          Dorsalis pedis pulses are 1+ on the right side and 1+ on the left side.       Posterior tibial pulses are 1+ on the right side and 1+ on the left side.  Pulmonary:     Effort: Pulmonary effort is normal.  Musculoskeletal:      Right lower leg: Edema present.     Left lower leg: Edema present.  Neurological:     Mental Status: She is alert and oriented to person, place, and time.  Psychiatric:        Mood and Affect: Mood normal.        Behavior: Behavior normal.        Thought Content: Thought content normal.        Judgment: Judgment normal.    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    Breast cancer ([place]) 09/2020   left breast   Cataract    GERD (gastroesophageal reflux disease)    Heart murmur 2001   Hiatal hernia    High cholesterol    Hypertension    patient denies anyone telling her that she has htn   Personal history of radiation therapy 2002, 2022   BREAST CA   Rosacea    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 complications     Social History   Socioeconomic History   Marital  status: Married    Spouse name: Fritz Pickerel   Number of children: 2   Years of education: Not on file   Highest education level: Bachelor's degree (e.g., BA, AB, BS)  Occupational History   Occupation: Pharmacist, hospital    Comment: retired  Tobacco Use   Smoking status: Never   Smokeless tobacco: Never  Vaping Use   Vaping Use: Never used  Substance and Sexual Activity   Alcohol use: No   Drug use: No   Sexual activity: Never  Other Topics Concern   Not on file  Social History Narrative   Patient lives with husband. She feels safe in her home.   Social Determinants of Health   Financial Resource Strain: Not on file  Food Insecurity: Not on file  Transportation Needs: Not on file  Physical Activity: Not on file  Stress: Not on file  Social Connections: Not on file  Intimate Partner Violence: Not on file    Past Surgical History:  Procedure  Laterality Date   ABDOMINAL HYSTERECTOMY     BLEPHAROPLASTY     BREAST BIOPSY Left 09/15/2020   affirm bx, x marker, DCIS   BREAST CYST ASPIRATION Left 2003   BREAST EXCISIONAL BIOPSY Right 2002   POS   BREAST LUMPECTOMY Right 2002   w/ radiation   BREAST LUMPECTOMY Left 10/07/2020   BREAST LUMPECTOMY Left    re-excision   BREAST LUMPECTOMY WITH NEEDLE LOCALIZATION Left 10/07/2020   Procedure: BREAST LUMPECTOMY WITH NEEDLE LOCALIZATION;  Surgeon: Robert Bellow, MD;  Location: [facility] ORS;  Service: General;  Laterality: Left;   BREAST SURGERY Right 2002   lumpectomy   CATARACT EXTRACTION W/ INTRAOCULAR LENS  IMPLANT, BILATERAL     CHOLECYSTECTOMY     COLONOSCOPY  2013   COLONOSCOPY WITH PROPOFOL N/A 12/08/2015   Procedure: COLONOSCOPY WITH PROPOFOL;  Surgeon: Christene Lye, MD;  Location: [facility] ENDOSCOPY;  Service: Endoscopy;  Laterality: N/A;   COLONOSCOPY WITH PROPOFOL N/A 12/06/2017   Procedure: COLONOSCOPY WITH PROPOFOL;  Surgeon: Jonathon Bellows, MD;  Location: [facility] ENDOSCOPY;  Service: Gastroenterology;  Laterality: N/A;   COLONOSCOPY WITH PROPOFOL N/A 12/22/2017   Procedure: COLONOSCOPY WITH PROPOFOL;  Surgeon: Jonathon Bellows, MD;  Location: [facility] ENDOSCOPY;  Service: Gastroenterology;  Laterality: N/A;   ESOPHAGOGASTRODUODENOSCOPY (EGD) WITH PROPOFOL N/A 12/08/2015   Procedure: ESOPHAGOGASTRODUODENOSCOPY (EGD) WITH PROPOFOL;  Surgeon: Christene Lye, MD;  Location: [facility] ENDOSCOPY;  Service: Endoscopy;  Laterality: N/A;   ESOPHAGOGASTRODUODENOSCOPY (EGD) WITH PROPOFOL N/A 12/06/2017   Procedure: ESOPHAGOGASTRODUODENOSCOPY (EGD) WITH PROPOFOL;  Surgeon: Jonathon Bellows, MD;  Location: [facility] ENDOSCOPY;  Service: Gastroenterology;  Laterality: N/A;   ESOPHAGOGASTRODUODENOSCOPY (EGD) WITH PROPOFOL N/A 12/22/2017   Procedure: ESOPHAGOGASTRODUODENOSCOPY (EGD) WITH PROPOFOL;  Surgeon: Jonathon Bellows, MD;  Location: [facility] ENDOSCOPY;  Service: Gastroenterology;  Laterality: N/A;   EYE SURGERY  Right 2014   EYE SURGERY Left 2014   GIVENS CAPSULE STUDY N/A 01/23/2018   Procedure: GIVENS CAPSULE STUDY;  Surgeon: Jonathon Bellows, MD;  Location: [facility] ENDOSCOPY;  Service: Gastroenterology;  Laterality: N/A;   iron infusion     had 5 iron infusions for anemia   post radiation therapy Right    right breast cancer 2002   RE-EXCISION OF BREAST CANCER,SUPERIOR MARGINS Left 11/09/2020   Procedure: RE-EXCISION OF BREAST CANCER,SUPERIOR MARGINS;  Surgeon: Robert Bellow, MD;  Location: [facility] ORS;  Service: General;  Laterality: Left;   Stab pheblectomy   2010   vein closure procedure Bilateral 2008    Family History  Problem Relation Age of Onset   Heart attack Mother    Heart attack Father    Heart attack Sister    Stroke Sister    Heart attack Brother    Other Brother        cerebral hemorrhage, sepsis   Breast cancer Cousin 37       breast/maternal   Cancer Cousin 16       breast/maternal   Breast cancer Cousin    Breast cancer Maternal Aunt 70   Breast cancer Other 48       maternal neice    No Known Allergies  CBC Latest Ref Rng & Units 05/19/2021 02/17/2021 12/17/2020  WBC 4.0 - 10.5 K/uL 7.6 9.0 9.7  Hemoglobin 12.0 - 15.0 g/dL 13.1 14.1 13.6  Hematocrit 36.0 - 46.0 % 39.9 40.9 40.5  Platelets 150 - 400 K/uL 327 336 341      CMP     Component Value Date/Time   NA 136 05/19/2021 1344   NA 142 03/30/2021 0905   K 4.3 05/19/2021 1344   CL 100 05/19/2021 1344   CO2 30 05/19/2021 1344   GLUCOSE 102 (H) 05/19/2021 1344   BUN 12 05/19/2021 1344   BUN 9 03/30/2021 0905   CREATININE 0.74 05/19/2021 1344   CALCIUM 8.9 05/19/2021 1344   PROT 7.1 05/19/2021 1344   PROT 6.9 03/30/2021 0905   ALBUMIN 4.2 05/19/2021 1344   ALBUMIN 4.8 (H) 03/30/2021 0905   AST 19 05/19/2021 1344   ALT 18 05/19/2021 1344   ALKPHOS 47 05/19/2021 1344   BILITOT 0.7 05/19/2021 1344   BILITOT 1.0 03/30/2021 0905   GFRNONAA >60 05/19/2021 1344   GFRAA 79 06/18/2020 0929     No results  found.     Assessment & Plan:   1. Varicose veins of lower extremity, unspecified laterality, unspecified whether complicated  Recommend:  The patient has large symptomatic varicose veins that are painful and associated with swelling.  I have had a long discussion with the patient regarding  varicose veins and why they cause symptoms.  Patient will begin wearing graduated compression stockings class 1 on a daily basis, beginning first thing in the morning and removing them in the evening. The patient is instructed specifically not to sleep in the stockings.    The patient  will also begin using over-the-counter analgesics such as Motrin 600 mg po TID to help control the symptoms.    In addition, behavioral modification including elevation during the day will be initiated.    Pending the results of these changes the  patient will be reevaluated in three months.   An  ultrasound of the venous system will be obtained.   Further plans will be based on the ultrasound results and whether conservative therapies are successful at eliminating the pain and swelling.    2. Pain in both lower extremities The patient's pain is not entirely consistent with pain that comes with varicosities.  She has some diminished pulses.  At her 35-monthfollow-up we will also check ABIs to ensure that t there are no arterial abnormalities.  3. Dyslipidemia Continue statin as ordered and reviewed, no changes at this time    Current Outpatient Medications on File Prior to Visit  Medication Sig Dispense Refill   amLODipine (NORVASC) 5 MG tablet Take 1 tablet (5 mg total) by mouth daily. 90 tablet 3   anastrozole (ARIMIDEX) 1 MG tablet Take 1 tablet (1 mg total) by mouth daily. 90 tablet  2   atorvastatin (LIPITOR) 10 MG tablet Take 1 tablet (10 mg total) by mouth daily. 90 tablet 3   calcium citrate (CALCITRATE - DOSED IN MG ELEMENTAL CALCIUM) 950 MG tablet Take 200 mg of elemental calcium by mouth 2 (two) times  daily.      docusate sodium (COLACE) 100 MG capsule Take 100 mg by mouth daily as needed for mild constipation.     Glucosamine 750 MG TABS Take 750 mg by mouth daily.     Glycerin-Polysorbate 80 (REFRESH DRY EYE THERAPY OP) Place 1 drop into both eyes 2 (two) times daily.     Multiple Vitamins-Minerals (MULTIVITAMIN WITH MINERALS) tablet Take 1 tablet by mouth daily.     omeprazole (PRILOSEC) 20 MG capsule Take 1 capsule (20 mg total) by mouth daily. 90 capsule 3   psyllium (REGULOID) 0.52 G capsule Take 2 capsules by mouth daily.     SOOLANTRA 1 % CREA Apply 1 application topically daily as needed (rosacea).     spironolactone (ALDACTONE) 50 MG tablet Take 50 mg by mouth daily.     tolterodine (DETROL LA) 4 MG 24 hr capsule Take 1 capsule (4 mg total) by mouth daily. 90 capsule 3   traMADol (ULTRAM) 50 MG tablet Take 1 tablet (50 mg total) by mouth every 6 (six) hours as needed for severe pain. 100 tablet 1   No current facility-administered medications on file prior to visit.    There are no Patient Instructions on file for this visit. No follow-ups on file.   Kris Hartmann, NP

## 2021-06-02 DIAGNOSIS — L600 Ingrowing nail: Secondary | ICD-10-CM | POA: Diagnosis not present

## 2021-06-02 DIAGNOSIS — M79675 Pain in left toe(s): Secondary | ICD-10-CM | POA: Diagnosis not present

## 2021-06-02 DIAGNOSIS — M79674 Pain in right toe(s): Secondary | ICD-10-CM | POA: Diagnosis not present

## 2021-06-02 DIAGNOSIS — L603 Nail dystrophy: Secondary | ICD-10-CM | POA: Diagnosis not present

## 2021-06-22 ENCOUNTER — Telehealth: Payer: Self-pay | Admitting: *Deleted

## 2021-06-22 NOTE — Telephone Encounter (Signed)
Patient will need to contact PCP.

## 2021-06-22 NOTE — Telephone Encounter (Signed)
Call returned to patient and advised she needs to contact her PCP.

## 2021-06-22 NOTE — Telephone Encounter (Signed)
Patient called reporting that her left wrist is hurting and she thinks she may have carpal tunnel ans is asking what Dr Tasia Catchings thinks she needs to do for it. Please advise

## 2021-07-08 DIAGNOSIS — L739 Follicular disorder, unspecified: Secondary | ICD-10-CM | POA: Diagnosis not present

## 2021-07-08 DIAGNOSIS — D485 Neoplasm of uncertain behavior of skin: Secondary | ICD-10-CM | POA: Diagnosis not present

## 2021-07-08 DIAGNOSIS — L821 Other seborrheic keratosis: Secondary | ICD-10-CM | POA: Diagnosis not present

## 2021-07-08 DIAGNOSIS — L298 Other pruritus: Secondary | ICD-10-CM | POA: Diagnosis not present

## 2021-07-08 DIAGNOSIS — L820 Inflamed seborrheic keratosis: Secondary | ICD-10-CM | POA: Diagnosis not present

## 2021-07-19 ENCOUNTER — Other Ambulatory Visit: Payer: Self-pay

## 2021-07-19 ENCOUNTER — Ambulatory Visit
Admission: RE | Admit: 2021-07-19 | Discharge: 2021-07-19 | Disposition: A | Discharge status: Home / Self Care | Payer: Medicare PPO | Source: Ambulatory Visit | Attending: Radiation Oncology | Admitting: Radiation Oncology

## 2021-07-19 VITALS — BP 158/80 | HR 81 | Temp 97.0°F | Resp 20 | Wt 181.1 lb

## 2021-07-19 DIAGNOSIS — D0512 Intraductal carcinoma in situ of left breast: Secondary | ICD-10-CM | POA: Insufficient documentation

## 2021-07-19 DIAGNOSIS — Z79811 Long term (current) use of aromatase inhibitors: Secondary | ICD-10-CM | POA: Insufficient documentation

## 2021-07-19 DIAGNOSIS — Z08 Encounter for follow-up examination after completed treatment for malignant neoplasm: Secondary | ICD-10-CM | POA: Diagnosis not present

## 2021-07-19 DIAGNOSIS — Z170 Estrogen receptor positive status [ER+]: Secondary | ICD-10-CM | POA: Diagnosis not present

## 2021-07-19 DIAGNOSIS — Z923 Personal history of irradiation: Secondary | ICD-10-CM | POA: Diagnosis not present

## 2021-07-19 NOTE — Progress Notes (Signed)
Survivorship Care Plan visit completed.  Treatment summary reviewed and given to patient.  ASCO answers booklet reviewed and given to patient.  CARE program and Cancer Transitions discussed with patient along with other resources cancer center offers to patients and caregivers.  Patient verbalized understanding.    

## 2021-07-19 NOTE — Progress Notes (Signed)
Radiation Oncology Follow up Note  Name: Claire Kane   Date:   07/19/2021 MRN:  [id number] DOB: [birth year]    This 80 y.o. 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  REFERRING PROVIDER: Jerrol Banana.,*  HPI: Patient is an 80 year old female now out 6 months having completed whole breast radiation to her left breast for ER positive DCIS.  Seen today in routine follow-up she is doing well.  She specifically denies breast tenderness cough or bone pain..  She is currently on Arimidex tolerating well without side effect.  She has not yet had follow-up mammograms COMPLICATIONS OF TREATMENT none  FOLLOW UP COMPLIANCE: keeps appointments   PHYSICAL EXAM:  BP (!) 158/80   Pulse 81   Temp (!) 97 F (36.1 C) (Tympanic)   Resp 20   Wt 181 lb 1.6 oz (82.1 kg)   BMI 33.12 kg/m  Lungs are clear to A&P cardiac examination essentially unremarkable with regular rate and rhythm. No dominant mass or nodularity is noted in either breast in 2 positions examined. Incision is well-healed. No axillary or supraclavicular adenopathy is appreciated. Cosmetic result is excellent.  Well-developed well-nourished patient in NAD. HEENT reveals PERLA, EOMI, discs not visualized.  Oral cavity is clear. No oral mucosal lesions are identified. Neck is clear without evidence of cervical or supraclavicular adenopathy. Lungs are clear to A&P. Cardiac examination is essentially unremarkable with regular rate and rhythm without murmur rub or thrill. Abdomen is benign with no organomegaly or masses noted. Motor sensory and DTR levels are equal and symmetric in the upper and lower extremities. Cranial nerves II through XII are grossly intact. Proprioception is intact. No peripheral adenopathy or edema is identified. No motor or sensory levels are noted. Crude visual fields are within normal range.  RADIOLOGY RESULTS: No current  films to review  PLAN: Present time patient is doing well low side effect profile 6 months out from whole breast radiation and pleased with her overall progress.  I have asked to see her back in 6 months for follow-up.  She continues on Arimidex without side effect.  Patient knows to call with any concerns.  I would like to take this opportunity to thank you for allowing me to participate in the care of your patient.Noreene Filbert, MD

## 2021-07-21 ENCOUNTER — Other Ambulatory Visit: Payer: Self-pay

## 2021-07-21 ENCOUNTER — Ambulatory Visit: Payer: Medicare PPO | Admitting: Physician Assistant

## 2021-07-21 ENCOUNTER — Encounter: Payer: Self-pay | Admitting: Physician Assistant

## 2021-07-21 VITALS — BP 147/64 | HR 75 | Temp 98.6°F | Wt 181.9 lb

## 2021-07-21 DIAGNOSIS — M654 Radial styloid tenosynovitis [de Quervain]: Secondary | ICD-10-CM | POA: Diagnosis not present

## 2021-07-21 NOTE — Progress Notes (Signed)
Established patient visit   Patient: Claire Kane   DOB: 10-Jan-1941   80 y.o. Female  MRN: [id number] Visit Date: 07/21/2021  Today's healthcare provider: Mikey Kirschner, PA-C   Cc. Left wrist pain  Subjective    HPI HPI     Wrist Pain    Additional comments: Left      Last edited by Beverlee Nims, CMA on 07/21/2021  1:25 PM.      Claire Kane is an 80 y/o female who presents today with left wrist pain x2 months.  She feels this pain along her left thumb, left side of her wrist is exacerbated by certain movements.  She wears a wrist brace she bought off of [employer] at night which seems to help her pain.  She is occasionally taken an Advil or Tylenol but this does not really improve her pain.  She also appreciates a small amount of swelling in her left wrist.  Denies any recent injury, denies any repetitive movements, is not left-handed.  Denies paresthesias.  In general she takes an occasional Motrin, occasional Tylenol and tramadol 50 mg once nightly.    Medications: Outpatient Medications Prior to Visit  Medication Sig   amLODipine (NORVASC) 5 MG tablet Take 1 tablet (5 mg total) by mouth daily.   anastrozole (ARIMIDEX) 1 MG tablet Take 1 tablet (1 mg total) by mouth daily.   atorvastatin (LIPITOR) 10 MG tablet Take 1 tablet (10 mg total) by mouth daily.   calcium citrate (CALCITRATE - DOSED IN MG ELEMENTAL CALCIUM) 950 MG tablet Take 200 mg of elemental calcium by mouth 2 (two) times daily.    docusate sodium (COLACE) 100 MG capsule Take 100 mg by mouth daily as needed for mild constipation.   ezetimibe (ZETIA) 10 MG tablet TAKE 1 TABLET EVERY DAY   Glucosamine 750 MG TABS Take 750 mg by mouth daily.   Glycerin-Polysorbate 80 (REFRESH DRY EYE THERAPY OP) Place 1 drop into both eyes 2 (two) times daily.   Multiple Vitamins-Minerals (MULTIVITAMIN WITH MINERALS) tablet Take 1 tablet by mouth daily.   omeprazole (PRILOSEC) 20 MG capsule Take 1 capsule (20 mg total) by mouth  daily.   psyllium (REGULOID) 0.52 G capsule Take 2 capsules by mouth daily.   SOOLANTRA 1 % CREA Apply 1 application topically daily as needed (rosacea).   spironolactone (ALDACTONE) 50 MG tablet Take 50 mg by mouth daily.   tolterodine (DETROL LA) 4 MG 24 hr capsule Take 1 capsule (4 mg total) by mouth daily.   traMADol (ULTRAM) 50 MG tablet Take 1 tablet (50 mg total) by mouth every 6 (six) hours as needed for severe pain.   No facility-administered medications prior to visit.    Review of Systems  Musculoskeletal:  Positive for arthralgias.  All other systems reviewed and are negative.  Last CBC Lab Results  Component Value Date   WBC 7.6 05/19/2021   HGB 13.1 05/19/2021   HCT 39.9 05/19/2021   MCV 91.3 05/19/2021   MCH 30.0 05/19/2021   RDW 13.0 05/19/2021   PLT 327 94/17/4081   Last metabolic panel Lab Results  Component Value Date   GLUCOSE 102 (H) 05/19/2021   NA 136 05/19/2021   K 4.3 05/19/2021   CL 100 05/19/2021   CO2 30 05/19/2021   BUN 12 05/19/2021   CREATININE 0.74 05/19/2021   GFRNONAA >60 05/19/2021   CALCIUM 8.9 05/19/2021   PHOS 3.9 12/13/2016   PROT 7.1 05/19/2021   ALBUMIN  4.2 05/19/2021   LABGLOB 2.1 03/30/2021   AGRATIO 2.3 (H) 03/30/2021   BILITOT 0.7 05/19/2021   ALKPHOS 47 05/19/2021   AST 19 05/19/2021   ALT 18 05/19/2021   ANIONGAP 6 05/19/2021   Last lipids Lab Results  Component Value Date   CHOL 123 03/30/2021   HDL 47 03/30/2021   LDLCALC 60 03/30/2021   TRIG 84 03/30/2021   CHOLHDL 2.6 03/30/2021   Last hemoglobin A1c Lab Results  Component Value Date   HGBA1C 6.1 (H) 03/30/2021   Last thyroid functions Lab Results  Component Value Date   TSH 2.110 06/10/2019   Last vitamin D No results found for: 25OHVITD2, 25OHVITD3, VD25OH Last vitamin B12 and Folate Lab Results  Component Value Date   VITAMINB12 425 11/29/2017   FOLATE >20.0 11/29/2017       Objective    BP (!) 147/64 (BP Location: Right Arm, Patient  Position: Sitting, Cuff Size: Large)   Pulse 75   Temp 98.6 F (37 C) (Oral)   Wt 181 lb 14.4 oz (82.5 kg)   SpO2 99%   BMI 33.27 kg/m  BP Readings from Last 3 Encounters:  07/21/21 (!) 147/64  07/19/21 (!) 158/80  05/19/21 (!) 148/76   Wt Readings from Last 3 Encounters:  07/21/21 181 lb 14.4 oz (82.5 kg)  07/19/21 181 lb 1.6 oz (82.1 kg)  05/19/21 182 lb (82.6 kg)      Physical Exam Musculoskeletal:     Right wrist: Normal.     Left wrist: Swelling and tenderness present. Normal range of motion.     Comments: + Finklestein test left wrist and tenderness over radial styloid. Minimal edema. No erythema or ecchymosis.      No results found for any visits on 07/21/21.  Assessment & Plan    De Quervain's Tendonitis Advised topical ice or heat Advised to switch to a wrist thumb splint with thumb spica splint for the next 2 weeks.  She states she will order this online.  She can wear this during the day and at night for the next 2 weeks. For overall pain management as Advil and Tylenol have not been successful for her and she is nervous to take Advil, can increase her tramadol to twice daily.  She has been conservative with her tramadol management so far.   Return if symptoms worsen or fail to improve.      I, Mikey Kirschner, PA-C have reviewed all documentation for this visit. The documentation on  07/21/2021 for the exam, diagnosis, procedures, and orders are all accurate and complete.    Mikey Kirschner, PA-C  [facility] [phone] (phone) [phone] (fax)  [place]

## 2021-07-21 NOTE — Patient Instructions (Addendum)
Wrist thumb splint or thumb spica splint for two weeks with tramadol twice a day Massage and ice/heat If not improved in 2 weeks return

## 2021-07-28 ENCOUNTER — Other Ambulatory Visit: Payer: Self-pay | Admitting: General Surgery

## 2021-07-28 DIAGNOSIS — D0512 Intraductal carcinoma in situ of left breast: Secondary | ICD-10-CM

## 2021-08-01 ENCOUNTER — Other Ambulatory Visit: Payer: Self-pay | Admitting: Family Medicine

## 2021-08-01 NOTE — Telephone Encounter (Signed)
Requested Prescriptions  Pending Prescriptions Disp Refills  . atorvastatin (LIPITOR) 10 MG tablet [Pharmacy Med Name: ATORVASTATIN CALCIUM 10 MG Tablet] 90 tablet 2    Sig: TAKE 1 TABLET EVERY DAY     Cardiovascular:  Antilipid - Statins Passed - 08/01/2021  3:16 AM      Passed - Total Cholesterol in normal range and within 360 days    Cholesterol, Total  Date Value Ref Range Status  03/30/2021 123 100 - 199 mg/dL Final         Passed - LDL in normal range and within 360 days    LDL Chol Calc (NIH)  Date Value Ref Range Status  03/30/2021 60 0 - 99 mg/dL Final         Passed - HDL in normal range and within 360 days    HDL  Date Value Ref Range Status  03/30/2021 47 >39 mg/dL Final         Passed - Triglycerides in normal range and within 360 days    Triglycerides  Date Value Ref Range Status  03/30/2021 84 0 - 149 mg/dL Final         Passed - Patient is not pregnant      Passed - Valid encounter within last 12 months    Recent Outpatient Visits          1 week ago Tennis Must Quervain's disease (tenosynovitis)   [facility] Thedore Mins, [place], PA-C   3 months ago Essential hypertension   [facility] Jerrol Banana., MD   7 months ago Essential hypertension   [facility] Jerrol Banana., MD   1 year ago Encounter for annual wellness visit (AWV) in Medicare patient   [facility] Jerrol Banana., MD   2 years ago Encounter for annual physical exam   [facility] Jerrol Banana., MD      Future Appointments            In 4 days Jerrol Banana., MD [facility] [place]

## 2021-08-05 ENCOUNTER — Encounter: Payer: Self-pay | Admitting: Family Medicine

## 2021-08-05 ENCOUNTER — Other Ambulatory Visit: Payer: Self-pay

## 2021-08-05 ENCOUNTER — Ambulatory Visit: Payer: Medicare PPO | Admitting: Family Medicine

## 2021-08-05 VITALS — BP 142/76 | HR 87 | Temp 98.4°F | Wt 182.6 lb

## 2021-08-05 DIAGNOSIS — I10 Essential (primary) hypertension: Secondary | ICD-10-CM

## 2021-08-05 DIAGNOSIS — D509 Iron deficiency anemia, unspecified: Secondary | ICD-10-CM

## 2021-08-05 DIAGNOSIS — R7303 Prediabetes: Secondary | ICD-10-CM

## 2021-08-05 DIAGNOSIS — Z6832 Body mass index (BMI) 32.0-32.9, adult: Secondary | ICD-10-CM

## 2021-08-05 DIAGNOSIS — E6609 Other obesity due to excess calories: Secondary | ICD-10-CM | POA: Diagnosis not present

## 2021-08-05 DIAGNOSIS — M1990 Unspecified osteoarthritis, unspecified site: Secondary | ICD-10-CM | POA: Diagnosis not present

## 2021-08-05 DIAGNOSIS — M654 Radial styloid tenosynovitis [de Quervain]: Secondary | ICD-10-CM | POA: Diagnosis not present

## 2021-08-05 DIAGNOSIS — Z853 Personal history of malignant neoplasm of breast: Secondary | ICD-10-CM

## 2021-08-05 DIAGNOSIS — I6529 Occlusion and stenosis of unspecified carotid artery: Secondary | ICD-10-CM | POA: Diagnosis not present

## 2021-08-05 DIAGNOSIS — M25532 Pain in left wrist: Secondary | ICD-10-CM

## 2021-08-05 DIAGNOSIS — N8111 Cystocele, midline: Secondary | ICD-10-CM

## 2021-08-05 LAB — POCT GLYCOSYLATED HEMOGLOBIN (HGB A1C)
Est. average glucose Bld gHb Est-mCnc: 123
Hemoglobin A1C: 5.9 % — AB (ref 4.0–5.6)

## 2021-08-05 NOTE — Progress Notes (Signed)
Established patient visit   Patient: Claire Kane   DOB: 1940/11/29   80 y.o. Female  MRN: [id number] Visit Date: 08/05/2021  Today's healthcare provider: Wilhemena Durie, MD   Chief Complaint  Patient presents with   Hypertension   Prediabetes   Subjective    HPI  Pt comes in for f/u.She has PreDM,breast cancer,,HTN,HLD,OA. She continues to complain of left wrist pain. No  Better.  Hypertension, follow-up  BP Readings from Last 3 Encounters:  08/05/21 (!) 142/76  07/21/21 (!) 147/64  07/19/21 (!) 158/80   Wt Readings from Last 3 Encounters:  08/05/21 182 lb 9.6 oz (82.8 kg)  07/21/21 181 lb 14.4 oz (82.5 kg)  07/19/21 181 lb 1.6 oz (82.1 kg)     She was last seen for hypertension 4 months ago.  BP at that visit was 149/78. Management since that visit includes; Good control with home readings in 130s over 70s. She reports good compliance with treatment. She is not having side effects.  She is not exercising. She is adherent to low salt diet.   Outside blood pressures are 140-140/70.  She does not smoke.  Use of agents associated with hypertension: none.   --------------------------------------------------------------------------------------------------- Prediabetes, Follow-up  Lab Results  Component Value Date   HGBA1C 6.1 (H) 03/30/2021   HGBA1C 6.0 (A) 12/28/2020   HGBA1C 5.9 (H) 06/18/2020   GLUCOSE 102 (H) 05/19/2021   GLUCOSE 107 (H) 03/30/2021   GLUCOSE 103 (H) 02/17/2021    Last seen for for this 4 months ago.  Management since that visit includes; Follow-up A1c and treat as appropriate.  Presently diet and exercise treated. Current symptoms include none and have been stable.  Prior visit with dietician: no Current diet: well balanced Current exercise: none  Pertinent Labs:    Component Value Date/Time   CHOL 123 03/30/2021 0905   TRIG 84 03/30/2021 0905   CHOLHDL 2.6 03/30/2021 0905   CREATININE 0.74 05/19/2021 1344    Wt  Readings from Last 3 Encounters:  08/05/21 182 lb 9.6 oz (82.8 kg)  07/21/21 181 lb 14.4 oz (82.5 kg)  07/19/21 181 lb 1.6 oz (82.1 kg)    -----------------------------------------------------------------------------------------      Medications: Outpatient Medications Prior to Visit  Medication Sig   amLODipine (NORVASC) 5 MG tablet Take 1 tablet (5 mg total) by mouth daily.   anastrozole (ARIMIDEX) 1 MG tablet Take 1 tablet (1 mg total) by mouth daily.   atorvastatin (LIPITOR) 10 MG tablet TAKE 1 TABLET EVERY DAY   docusate sodium (COLACE) 100 MG capsule Take 100 mg by mouth daily as needed for mild constipation.   ezetimibe (ZETIA) 10 MG tablet TAKE 1 TABLET EVERY DAY   Glucosamine 750 MG TABS Take 750 mg by mouth daily.   Glycerin-Polysorbate 80 (REFRESH DRY EYE THERAPY OP) Place 1 drop into both eyes 2 (two) times daily.   omeprazole (PRILOSEC) 20 MG capsule Take 1 capsule (20 mg total) by mouth daily.   psyllium (REGULOID) 0.52 G capsule Take 2 capsules by mouth daily.   SOOLANTRA 1 % CREA Apply 1 application topically daily as needed (rosacea).   spironolactone (ALDACTONE) 50 MG tablet Take 50 mg by mouth daily.   tolterodine (DETROL LA) 4 MG 24 hr capsule Take 1 capsule (4 mg total) by mouth daily.   traMADol (ULTRAM) 50 MG tablet Take 1 tablet (50 mg total) by mouth every 6 (six) hours as needed for severe pain.   calcium  citrate (CALCITRATE - DOSED IN MG ELEMENTAL CALCIUM) 950 MG tablet Take 200 mg of elemental calcium by mouth 2 (two) times daily.    Multiple Vitamins-Minerals (MULTIVITAMIN WITH MINERALS) tablet Take 1 tablet by mouth daily. (Patient not taking: Reported on 08/05/2021)   No facility-administered medications prior to visit.    Review of Systems  Constitutional:  Negative for appetite change, chills, fatigue and fever.  Respiratory:  Negative for chest tightness and shortness of breath.   Cardiovascular:  Negative for chest pain and palpitations.   Gastrointestinal:  Negative for abdominal pain, nausea and vomiting.  Neurological:  Negative for dizziness and weakness.      Objective    BP (!) 142/76 (BP Location: Right Arm, Patient Position: Sitting, Cuff Size: Normal)   Pulse 87   Temp 98.4 F (36.9 C) (Oral)   Wt 182 lb 9.6 oz (82.8 kg)   SpO2 99%   BMI 33.40 kg/m  {Show previous vital signs (optional):23777}  Physical Exam Vitals reviewed.  Constitutional:      Appearance: Normal appearance.  HENT:     Head: Normocephalic and atraumatic.     Right Ear: External ear normal.     Left Ear: External ear normal.  Eyes:     General: No scleral icterus.    Conjunctiva/sclera: Conjunctivae normal.  Cardiovascular:     Pulses: Normal pulses.     Heart sounds: Normal heart sounds.  Pulmonary:     Effort: Pulmonary effort is normal.     Breath sounds: Normal breath sounds.  Abdominal:     Palpations: Abdomen is soft.  Musculoskeletal:     Right wrist: Normal.     Left wrist: Swelling and tenderness present. Normal range of motion.     Comments: + Finklestein test left wrist and tenderness over radial styloid. Minimal edema. No erythema or ecchymosis.  Skin:    General: Skin is warm and dry.  Neurological:     General: No focal deficit present.     Mental Status: She is alert and oriented to person, place, and time.  Psychiatric:        Mood and Affect: Mood normal.        Behavior: Behavior normal.        Thought Content: Thought content normal.        Judgment: Judgment normal.      No results found for any visits on 08/05/21.  Assessment & Plan     1. Essential hypertension Fair control loaded pain 5 spironolactone 50  2. Borderline diabetes mellitus A1c of 5.9 so excellent control on diet and exercise. - POCT glycosylated hemoglobin (Hb A1C)  3. De Quervain's disease (tenosynovitis) For to hand surgeon  4. Left wrist pain  - Ambulatory referral to Orthopedic Surgery  5. Iron deficiency anemia,  unspecified iron deficiency anemia type   6. Class 1 obesity due to excess calories with serious comorbidity and body mass index (BMI) of 32.0 to 32.9 in adult   7. History of breast cancer, DCIS right breast, 2002   8. Osteoarthritis, unspecified osteoarthritis type, unspecified site   9. Stenosis of carotid artery, unspecified laterality All risk factors treated.  General atorvastatin 10  10. Cystocele, midline    No follow-ups on file.      I, Wilhemena Durie, MD, have reviewed all documentation for this visit. The documentation on 08/07/21 for the exam, diagnosis, procedures, and orders are all accurate and complete.    Wilhemena Durie, MD  [place] [phone] (phone) [phone] (fax)  [place]

## 2021-08-17 ENCOUNTER — Ambulatory Visit (INDEPENDENT_AMBULATORY_CARE_PROVIDER_SITE_OTHER): Payer: Medicare PPO | Admitting: Vascular Surgery

## 2021-08-17 ENCOUNTER — Other Ambulatory Visit: Payer: Self-pay

## 2021-08-17 ENCOUNTER — Other Ambulatory Visit (INDEPENDENT_AMBULATORY_CARE_PROVIDER_SITE_OTHER): Payer: Self-pay | Admitting: Nurse Practitioner

## 2021-08-17 ENCOUNTER — Ambulatory Visit (INDEPENDENT_AMBULATORY_CARE_PROVIDER_SITE_OTHER): Payer: Medicare PPO

## 2021-08-17 VITALS — BP 154/78 | HR 81 | Ht 62.0 in | Wt 180.0 lb

## 2021-08-17 DIAGNOSIS — M79604 Pain in right leg: Secondary | ICD-10-CM

## 2021-08-17 DIAGNOSIS — E7849 Other hyperlipidemia: Secondary | ICD-10-CM | POA: Diagnosis not present

## 2021-08-17 DIAGNOSIS — M79605 Pain in left leg: Secondary | ICD-10-CM

## 2021-08-17 DIAGNOSIS — E785 Hyperlipidemia, unspecified: Secondary | ICD-10-CM

## 2021-08-17 DIAGNOSIS — I83813 Varicose veins of bilateral lower extremities with pain: Secondary | ICD-10-CM | POA: Diagnosis not present
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
## 2021-08-19 DIAGNOSIS — I83813 Varicose veins of bilateral lower extremities with pain: Secondary | ICD-10-CM | POA: Insufficient documentation

## 2021-08-19 NOTE — Assessment & Plan Note (Signed)
lipid control important in reducing the progression of atherosclerotic disease. Continue statin therapy  

## 2021-08-19 NOTE — Assessment & Plan Note (Signed)
The patient does have significant reflux in both great saphenous veins with symptoms of pain and swelling.  We discussed the options including continued conservative measures versus laser ablation of both great saphenous veins in a staged fashion.  She does not want to have any procedures performed and would prefer to continue compression socks and elevation indefinitely.  She says she will contact our office if she decides to have any intervention.

## 2021-08-20 ENCOUNTER — Other Ambulatory Visit: Payer: Self-pay

## 2021-08-20 ENCOUNTER — Ambulatory Visit
Admission: RE | Admit: 2021-08-20 | Discharge: 2021-08-20 | Disposition: A | Discharge status: Home / Self Care | Payer: Medicare PPO | Source: Ambulatory Visit | Attending: General Surgery | Admitting: General Surgery

## 2021-08-20 ENCOUNTER — Telehealth: Payer: Self-pay | Admitting: *Deleted

## 2021-08-20 DIAGNOSIS — D0512 Intraductal carcinoma in situ of left breast: Secondary | ICD-10-CM | POA: Insufficient documentation

## 2021-08-20 DIAGNOSIS — Z853 Personal history of malignant neoplasm of breast: Secondary | ICD-10-CM | POA: Diagnosis not present

## 2021-08-20 DIAGNOSIS — R922 Inconclusive mammogram: Secondary | ICD-10-CM | POA: Diagnosis not present

## 2021-08-20 NOTE — Telephone Encounter (Signed)
CAll returned to patient home and husband answered stating patient was not home now, but he took message that she can get both injection same day if needed per Dr Tasia Catchings. He thanked me for calling back

## 2021-08-20 NOTE — Telephone Encounter (Signed)
Patient called stating that she is scheduled for Prolia injection on 12/13 and that same day, she has an appointment with Dr Peggye Ley about her wrist and thinks she may get a cortisone injection when she sees Dr Peggye Ley. She is asking if she can get both injection the same day or if she needs to reschedule her appointment with Dr Peggye Ley to another day.Please advise

## 2021-08-23 ENCOUNTER — Other Ambulatory Visit: Payer: Self-pay | Admitting: Family Medicine

## 2021-08-23 DIAGNOSIS — K30 Functional dyspepsia: Secondary | ICD-10-CM

## 2021-08-23 MED ORDER — OMEPRAZOLE 20 MG PO CPDR: 20.0000 mg | DELAYED_RELEASE_CAPSULE | Freq: Every day | ORAL | 3 refills | Status: DC

## 2021-08-23 NOTE — Telephone Encounter (Signed)
Medication Refill - Medication: omeprazole (PRILOSEC) 20 MG capsule   Has the patient contacted their pharmacy? Yes.   (Agent: If no, request that the patient contact the pharmacy for the refill. If patient does not wish to contact the pharmacy document the reason why and proceed with request.) (Agent: If yes, when and what did the pharmacy advise?)  Preferred Pharmacy (with phone number or street name):  [place]  [place] [address]  Phone: [phone] Fax: [phone]   Has the patient been seen for an appointment in the last year OR does the patient have an upcoming appointment? Yes.    Agent: Please be advised that RX refills may take up to 3 business days. We ask that you follow-up with your pharmacy.

## 2021-08-23 NOTE — Telephone Encounter (Signed)
Requested Prescriptions  Pending Prescriptions Disp Refills  . omeprazole (PRILOSEC) 20 MG capsule 90 capsule 3    Sig: Take 1 capsule (20 mg total) by mouth daily.     Gastroenterology: Proton Pump Inhibitors Passed - 08/23/2021  2:11 PM      Passed - Valid encounter within last 12 months    Recent Outpatient Visits          2 weeks ago Essential hypertension   [facility] Jerrol Banana., MD   1 month ago Harriet Pho disease (tenosynovitis)   [facility] Thedore Mins, [place], PA-C   4 months ago Essential hypertension   [facility] Jerrol Banana., MD   7 months ago Essential hypertension   [facility] Jerrol Banana., MD   1 year ago Encounter for annual wellness visit (AWV) in Medicare patient   [facility] Jerrol Banana., MD      Future Appointments            In 5 months Jerrol Banana., MD [facility] [place]

## 2021-08-26 ENCOUNTER — Other Ambulatory Visit: Payer: Self-pay | Admitting: *Deleted

## 2021-08-26 DIAGNOSIS — D0512 Intraductal carcinoma in situ of left breast: Secondary | ICD-10-CM

## 2021-08-31 ENCOUNTER — Other Ambulatory Visit: Payer: Medicare PPO

## 2021-08-31 ENCOUNTER — Ambulatory Visit: Payer: Medicare PPO

## 2021-08-31 ENCOUNTER — Ambulatory Visit: Payer: Medicare PPO | Admitting: Oncology

## 2021-08-31 DIAGNOSIS — G5603 Carpal tunnel syndrome, bilateral upper limbs: Secondary | ICD-10-CM | POA: Diagnosis not present

## 2021-08-31 DIAGNOSIS — M65341 Trigger finger, right ring finger: Secondary | ICD-10-CM | POA: Diagnosis not present

## 2021-08-31 DIAGNOSIS — M654 Radial styloid tenosynovitis [de Quervain]: Secondary | ICD-10-CM | POA: Diagnosis not present

## 2021-09-01 ENCOUNTER — Inpatient Hospital Stay: Payer: Medicare PPO | Admitting: Nurse Practitioner

## 2021-09-01 ENCOUNTER — Encounter: Payer: Self-pay | Admitting: Nurse Practitioner

## 2021-09-01 ENCOUNTER — Other Ambulatory Visit: Payer: Self-pay

## 2021-09-01 ENCOUNTER — Inpatient Hospital Stay: Payer: Medicare PPO | Attending: Nurse Practitioner

## 2021-09-01 ENCOUNTER — Other Ambulatory Visit: Payer: Self-pay | Admitting: *Deleted

## 2021-09-01 ENCOUNTER — Inpatient Hospital Stay: Payer: Medicare PPO

## 2021-09-01 VITALS — BP 139/73 | HR 87 | Temp 96.9°F | Resp 16 | Ht 62.0 in | Wt 180.0 lb

## 2021-09-01 DIAGNOSIS — Z79811 Long term (current) use of aromatase inhibitors: Secondary | ICD-10-CM

## 2021-09-01 DIAGNOSIS — D72829 Elevated white blood cell count, unspecified: Secondary | ICD-10-CM | POA: Diagnosis not present

## 2021-09-01 DIAGNOSIS — Z853 Personal history of malignant neoplasm of breast: Secondary | ICD-10-CM

## 2021-09-01 DIAGNOSIS — D0512 Intraductal carcinoma in situ of left breast: Secondary | ICD-10-CM | POA: Diagnosis not present

## 2021-09-01 DIAGNOSIS — Z170 Estrogen receptor positive status [ER+]: Secondary | ICD-10-CM | POA: Insufficient documentation

## 2021-09-01 DIAGNOSIS — M8580 Other specified disorders of bone density and structure, unspecified site: Secondary | ICD-10-CM

## 2021-09-01 DIAGNOSIS — Z08 Encounter for follow-up examination after completed treatment for malignant neoplasm: Secondary | ICD-10-CM | POA: Diagnosis not present

## 2021-09-01 DIAGNOSIS — M1990 Unspecified osteoarthritis, unspecified site: Secondary | ICD-10-CM | POA: Insufficient documentation

## 2021-09-01 DIAGNOSIS — D509 Iron deficiency anemia, unspecified: Secondary | ICD-10-CM | POA: Insufficient documentation

## 2021-09-01 LAB — CBC WITH DIFFERENTIAL/PLATELET
Abs Immature Granulocytes: 0.06 10*3/uL (ref 0.00–0.07)
Basophils Absolute: 0 10*3/uL (ref 0.0–0.1)
Basophils Relative: 0 %
Eosinophils Absolute: 0 10*3/uL (ref 0.0–0.5)
Eosinophils Relative: 0 %
HCT: 40.4 % (ref 36.0–46.0)
Hemoglobin: 13.7 g/dL (ref 12.0–15.0)
Immature Granulocytes: 0 %
Lymphocytes Relative: 6 %
Lymphs Abs: 0.9 10*3/uL (ref 0.7–4.0)
MCH: 30.4 pg (ref 26.0–34.0)
MCHC: 33.9 g/dL (ref 30.0–36.0)
MCV: 89.8 fL (ref 80.0–100.0)
Monocytes Absolute: 0.9 10*3/uL (ref 0.1–1.0)
Monocytes Relative: 6 %
Neutro Abs: 13 10*3/uL — ABNORMAL HIGH (ref 1.7–7.7)
Neutrophils Relative %: 88 %
Platelets: 361 10*3/uL (ref 150–400)
RBC: 4.5 MIL/uL (ref 3.87–5.11)
RDW: 12.6 % (ref 11.5–15.5)
WBC: 14.9 10*3/uL — ABNORMAL HIGH (ref 4.0–10.5)
nRBC: 0 % (ref 0.0–0.2)

## 2021-09-01 LAB — COMPREHENSIVE METABOLIC PANEL
ALT: 15 U/L (ref 0–44)
AST: 19 U/L (ref 15–41)
Albumin: 4.6 g/dL (ref 3.5–5.0)
Alkaline Phosphatase: 48 U/L (ref 38–126)
Anion gap: 10 (ref 5–15)
BUN: 20 mg/dL (ref 8–23)
CO2: 26 mmol/L (ref 22–32)
Calcium: 9.6 mg/dL (ref 8.9–10.3)
Chloride: 99 mmol/L (ref 98–111)
Creatinine, Ser: 0.89 mg/dL (ref 0.44–1.00)
GFR, Estimated: >60 mL/min (ref >60)
Glucose, Bld: 119 mg/dL — ABNORMAL HIGH (ref 70–99)
Potassium: 4.4 mmol/L (ref 3.5–5.1)
Sodium: 135 mmol/L (ref 135–145)
Total Bilirubin: 1.5 mg/dL — ABNORMAL HIGH (ref 0.3–1.2)
Total Protein: 7.9 g/dL (ref 6.5–8.1)

## 2021-09-01 MED ORDER — DENOSUMAB 60 MG/ML ~~LOC~~ SOSY
60.0000 mg | PREFILLED_SYRINGE | Freq: Once | SUBCUTANEOUS | Status: AC
  Administered 2021-09-01: 12:00:00 60 mg via SUBCUTANEOUS
  Filled 2021-09-01: qty 1

## 2021-09-01 MED ORDER — EXEMESTANE 25 MG PO TABS: 25.0000 mg | ORAL_TABLET | Freq: Every day | ORAL | 2 refills | Status: DC

## 2021-09-01 MED ORDER — SODIUM CHLORIDE 0.9 % IV SOLN
Freq: Once | INTRAVENOUS | Status: AC
  Filled 2021-09-01: qty 250

## 2021-09-01 NOTE — Progress Notes (Signed)
Right hand has arthritis but having carpal tunnel and got inj. Then the left wrist for tendonitis. Because of her aches and pains she would like to try another AI

## 2021-09-01 NOTE — Progress Notes (Signed)
Hematology/Oncology Follow Up Note [place] Telephone:([phone] [phone]   Patient Care Team: Jerrol Banana., MD as PCP - General (Family Medicine) Dingeldein, Remo Lipps, MD as Consulting Physician (Ophthalmology) Gae Dry, MD as Referring Physician (Obstetrics and Gynecology) Earlie Server, MD as Consulting Physician (Oncology) Thomes Dinning, MD as Referring Physician (Internal Medicine)  REFERRING PROVIDER: Nilsa Nutting  REASON FOR VISIT Follow-up for DCIS  HISTORY OF PRESENTING ILLNESS:  Claire Kane is a  80 y.o.  female with PMH listed below who was referred to me for evaluation of anemia.  Patient reports she was told that she had iron deficiency 2 years ago and she took iron supplementation for a while and was told to stop. Recently found to to have worsening of anemia. She had Upper and lower endoscopy in 2017 by Dr.Sankar # Upper endoscopy 12/07/2017 Normal examined duodenum. - Large hiatal hernia. - Erythematous mucosa in the antrum. Biopsied. - The examination was otherwise normal # Colonoscopy on 12/07/2017  Non-thrombosed external hemorrhoids found on perianal exam. - Diverticulosis in the sigmoid colon and in the descending colon. - The examination was otherwise normal on direct and retroflexion views. - No specimens collected. #  capsule study which did not reveal any active bleeding source.  # Remote history of DCIS right breast in 2002, s/p surgery and RT. Finished 5 years of tamoxifen.   # April - May s/p upper and lower endoscopy, capsule study which did not reveal active bleeding source  Celiac panel positive.  # iron deficiency anemia.  Previously she has received IV Venofer. Patient has previously underwent extensive gastroenterology work-up including EGD, colonoscopy, capsule study which did not reveal active bleeding source.  #08/18/2020, screening mammogram showed left breast calcification. 09/07/2020,  diagnostic left mammogram showed an area of 1.4 cm group of calcifications in the upper outer left breast.  Patient underwent breast biopsy, pathology came back positive for high-grade DCIS, focal necrosis, associated with calcifications.  ER status deferred to existing specimen.  # #10/07/2020, status post left DCIS lumpectomy by Dr. Bary Castilla Pathology showed residual high-grade DCIS with associated calcifications.  Extent of DCIS at least 12 mm, grade 3, central necrosis, anterior and inferior margins were unifocal in positive for DCIS pTis, ER positive.  11/09/2020 reexcision showed no residual DCIS or invasive carcinoma. 01/04/2021, adjuvant breast radiation 02/22/21- received zometa x 1. Unable to tolerate d/t arthralgias and myalgias  Patient reports family history of VTE daughter who was tested positive for mutation. Significant family history of cardiovascular disease, familiar hypercholesterolemia. Parents, sister, brother passed away from heart attack.  01/08/2021, started on Arimidex.   Interval History: Patient is 80 year old female who returns to clinic for labs, continued surveillance of left breast DCIS, and consideration of Prolia. She was seen by Dr. Baruch Gouty in October and was NED. Continues to have significant arthralgias with arimidex. Is interested in trying a different medication. Was previously not able to tolerate zometa d/t this same reason.    Review of Systems  Constitutional:  Negative for chills, fever, malaise/fatigue and weight loss.  HENT:  Negative for hearing loss, nosebleeds, sore throat and tinnitus.   Eyes:  Negative for blurred vision and double vision.  Respiratory:  Negative for cough, hemoptysis, shortness of breath and wheezing.   Cardiovascular:  Negative for chest pain, palpitations and leg swelling.  Gastrointestinal:  Negative for abdominal pain, blood in stool, constipation, diarrhea, melena, nausea and vomiting.  Genitourinary:  Negative for dysuria  and urgency.  Musculoskeletal:  Negative for back pain, falls, joint pain and myalgias.  Skin:  Negative for itching and rash.  Neurological:  Negative for dizziness, tingling, sensory change, loss of consciousness, weakness and headaches.  Endo/Heme/Allergies:  Negative for environmental allergies. Does not bruise/bleed easily.  Psychiatric/Behavioral:  Negative for depression. The patient is not nervous/anxious and does not have insomnia.     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   BRCA1 and BRCA2   Breast cancer ([place]) 2002   DCIS right breast    Breast cancer ([place]) 09/2020   left breast   Cataract    GERD (gastroesophageal reflux disease)    Heart murmur 2001   Hiatal hernia    High cholesterol    Hypertension    patient denies anyone telling her that she has htn   Personal history of radiation therapy 2002, 2022   BREAST CA   Rosacea    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 complications     SURGICAL HISTORY: Past Surgical History:  Procedure Laterality Date   ABDOMINAL HYSTERECTOMY     BLEPHAROPLASTY     BREAST BIOPSY Left 09/15/2020   affirm bx, x marker, DCIS   BREAST CYST ASPIRATION Left 2003   BREAST EXCISIONAL BIOPSY Right 2002   POS   BREAST LUMPECTOMY Right 2002   w/ radiation   BREAST LUMPECTOMY Left 10/07/2020   RESIDUAL HIGH-GRADE DUCTAL CARCINOMA IN SITU (DCIS) DCIS is focally present 1.4 mm to the superior margin.   BREAST LUMPECTOMY Left    re-excision   BREAST LUMPECTOMY WITH NEEDLE LOCALIZATION Left 10/07/2020   Procedure: BREAST LUMPECTOMY WITH NEEDLE LOCALIZATION;  Surgeon: Robert Bellow, MD;  Location: [facility] ORS;  Service: General;  Laterality: Left;   BREAST SURGERY Right 2002   lumpectomy   CATARACT EXTRACTION W/ INTRAOCULAR LENS  IMPLANT, BILATERAL     CHOLECYSTECTOMY     COLONOSCOPY  2013   COLONOSCOPY  WITH PROPOFOL N/A 12/08/2015   Procedure: COLONOSCOPY WITH PROPOFOL;  Surgeon: Christene Lye, MD;  Location: [facility] ENDOSCOPY;  Service: Endoscopy;  Laterality: N/A;   COLONOSCOPY WITH PROPOFOL N/A 12/06/2017   Procedure: COLONOSCOPY WITH PROPOFOL;  Surgeon: Jonathon Bellows, MD;  Location: [facility] ENDOSCOPY;  Service: Gastroenterology;  Laterality: N/A;   COLONOSCOPY WITH PROPOFOL N/A 12/22/2017   Procedure: COLONOSCOPY WITH PROPOFOL;  Surgeon: Jonathon Bellows, MD;  Location: [facility] ENDOSCOPY;  Service: Gastroenterology;  Laterality: N/A;   ESOPHAGOGASTRODUODENOSCOPY (EGD) WITH PROPOFOL N/A 12/08/2015   Procedure: ESOPHAGOGASTRODUODENOSCOPY (EGD) WITH PROPOFOL;  Surgeon: Christene Lye, MD;  Location: [facility] ENDOSCOPY;  Service: Endoscopy;  Laterality: N/A;   ESOPHAGOGASTRODUODENOSCOPY (EGD) WITH PROPOFOL N/A 12/06/2017   Procedure: ESOPHAGOGASTRODUODENOSCOPY (EGD) WITH PROPOFOL;  Surgeon: Jonathon Bellows, MD;  Location: [facility] ENDOSCOPY;  Service: Gastroenterology;  Laterality: N/A;   ESOPHAGOGASTRODUODENOSCOPY (EGD) WITH PROPOFOL N/A 12/22/2017   Procedure: ESOPHAGOGASTRODUODENOSCOPY (EGD) WITH PROPOFOL;  Surgeon: Jonathon Bellows, MD;  Location: [facility] ENDOSCOPY;  Service: Gastroenterology;  Laterality: N/A;   EYE SURGERY Right 2014   EYE SURGERY Left 2014   GIVENS CAPSULE STUDY N/A 01/23/2018   Procedure: GIVENS CAPSULE STUDY;  Surgeon: Jonathon Bellows, MD;  Location: [facility] ENDOSCOPY;  Service: Gastroenterology;  Laterality: N/A;   iron infusion     had 5 iron infusions for anemia   post radiation therapy Right  right breast cancer 2002   RE-EXCISION OF BREAST CANCER,SUPERIOR MARGINS Left 11/09/2020   Procedure: RE-EXCISION OF BREAST CANCER,SUPERIOR MARGINS;  Surgeon: Robert Bellow, MD;  Location: [facility] ORS;  Service: General;  Laterality: Left;   Stab pheblectomy   2010   vein closure procedure Bilateral 2008    SOCIAL HISTORY: Social History   Socioeconomic History   Marital status: Married     Spouse name: Fritz Pickerel   Number of children: 2   Years of education: Not on file   Highest education level: Bachelor's degree (e.g., BA, AB, BS)  Occupational History   Occupation: Pharmacist, hospital    Comment: retired  Tobacco Use   Smoking status: Never   Smokeless tobacco: Never  Vaping Use   Vaping Use: Never used  Substance and Sexual Activity   Alcohol use: No   Drug use: No   Sexual activity: Never  Other Topics Concern   Not on file  Social History Narrative   Patient lives with husband. She feels safe in her home.   Social Determinants of Health   Financial Resource Strain: Not on file  Food Insecurity: Not on file  Transportation Needs: Not on file  Physical Activity: Not on file  Stress: Not on file  Social Connections: Not on file  Intimate Partner Violence: Not on file    FAMILY HISTORY: Family History  Problem Relation Age of Onset   Heart attack Mother    Heart attack Father    Heart attack Sister    Stroke Sister    Heart attack Brother    Other Brother        cerebral hemorrhage, sepsis   Breast cancer Cousin 33       breast/maternal   Cancer Cousin 63       breast/maternal   Breast cancer Cousin    Breast cancer Maternal Aunt 70   Breast cancer Other 48       maternal neice    ALLERGIES:  has No Known Allergies.  MEDICATIONS:  Current Outpatient Medications  Medication Sig Dispense Refill   amLODipine (NORVASC) 5 MG tablet Take 1 tablet (5 mg total) by mouth daily. 90 tablet 3   anastrozole (ARIMIDEX) 1 MG tablet Take 1 tablet (1 mg total) by mouth daily. 90 tablet 2   atorvastatin (LIPITOR) 10 MG tablet TAKE 1 TABLET EVERY DAY 90 tablet 2   calcium citrate (CALCITRATE - DOSED IN MG ELEMENTAL CALCIUM) 950 MG tablet Take 200 mg of elemental calcium by mouth 2 (two) times daily.      docusate sodium (COLACE) 100 MG capsule Take 100 mg by mouth daily as needed for mild constipation.     ezetimibe (ZETIA) 10 MG tablet TAKE 1 TABLET EVERY DAY 90 tablet 1    Glucosamine 750 MG TABS Take 750 mg by mouth daily.     Glycerin-Polysorbate 80 (REFRESH DRY EYE THERAPY OP) Place 1 drop into both eyes 2 (two) times daily.     omeprazole (PRILOSEC) 20 MG capsule Take 1 capsule (20 mg total) by mouth daily. 90 capsule 3   psyllium (REGULOID) 0.52 G capsule Take 2 capsules by mouth daily.     SOOLANTRA 1 % CREA Apply 1 application topically daily as needed (rosacea).     spironolactone (ALDACTONE) 50 MG tablet Take 50 mg by mouth daily.     tolterodine (DETROL LA) 4 MG 24 hr capsule Take 1 capsule (4 mg total) by mouth daily. 90 capsule 3   traMADol (  ULTRAM) 50 MG tablet Take 1 tablet (50 mg total) by mouth every 6 (six) hours as needed for severe pain. 100 tablet 1   Multiple Vitamins-Minerals (MULTIVITAMIN WITH MINERALS) tablet Take 1 tablet by mouth daily. (Patient not taking: Reported on 09/01/2021)     No current facility-administered medications for this visit.    PHYSICAL EXAMINATION: ECOG PERFORMANCE STATUS: 0 - Asymptomatic Vitals:   09/01/21 1125  BP: 139/73  Pulse: 87  Resp: 16  Temp: (!) 96.9 F (36.1 C)    Filed Weights   09/01/21 1125  Weight: 180 lb (81.6 kg)   Physical Exam Constitutional:      Appearance: She is not ill-appearing.  Eyes:     General: No scleral icterus.    Conjunctiva/sclera: Conjunctivae normal.  Cardiovascular:     Rate and Rhythm: Normal rate and regular rhythm.  Abdominal:     General: There is no distension.     Palpations: Abdomen is soft.     Tenderness: There is no abdominal tenderness. There is no guarding.  Musculoskeletal:        General: No deformity.     Right lower leg: No edema.     Left lower leg: No edema.  Lymphadenopathy:     Cervical: No cervical adenopathy.  Skin:    General: Skin is warm and dry.  Neurological:     Mental Status: She is alert and oriented to person, place, and time. Mental status is at baseline.  Psychiatric:        Mood and Affect: Mood normal.         Behavior: Behavior normal.    LABORATORY DATA:  I have reviewed the data as listed Lab Results  Component Value Date   WBC 14.9 (H) 09/01/2021   HGB 13.7 09/01/2021   HCT 40.4 09/01/2021   MCV 89.8 09/01/2021   PLT 361 09/01/2021   Recent Labs    09/24/20 1426 09/30/20 1132 02/17/21 1400 03/30/21 0905 05/19/21 1344 09/01/21 1056  NA  --    < > 137 142 136 135  K  --    < > 4.1 4.2 4.3 4.4  CL  --    < > 99 102 100 99  CO2  --    < > 27 25 30 26   GLUCOSE  --    < > 103* 107* 102* 119*  BUN  --    < > 18 9 12 20   CREATININE  --    < > 0.80 0.72 0.74 0.89  CALCIUM  --    < > 9.3 9.2 8.9 9.6  GFRNONAA  --    < > >60  --  >60 >60  PROT 7.0  --  7.2 6.9 7.1 7.9  ALBUMIN 4.2  --  4.2 4.8* 4.2 4.6  AST 17  --  18 12 19 19   ALT 15  --  15 13 18 15   ALKPHOS 56  --  71 76 47 48  BILITOT 1.1  --  1.1 1.0 0.7 1.5*  BILIDIR 0.2  --   --   --   --   --   IBILI 0.9  --   --   --   --   --    < > = values in this interval not displayed.   Iron/TIBC/Ferritin/ %Sat    Component Value Date/Time   IRON 73 09/24/2020 1426   IRON 184 (H) 06/18/2020 0929   TIBC 344 09/24/2020 1426  TIBC 314 06/10/2019 1029   FERRITIN 34 09/24/2020 1426   FERRITIN 109 06/10/2019 1029   IRONPCTSAT 21 09/24/2020 1426   IRONPCTSAT 37 06/10/2019 1029     ASSESSMENT & PLAN:  Patient is a 80 year old female present for management of left breast DCIS No diagnosis found.   Left breast DCIS high grade. pTis, ER positive- s/p left lumpectomy, margin positive, s/p reexcision with negative margins followed by breast radiation completed 01/05/21. Initiated arimidex 1 mg daily April 2022 with plan for at least 5 years of adjuvant treatment completing in April 2027. Mammogram with Dr. Bary Castilla 08/20/21 was bi-rads category 2: benign. Plan to repeat in 08/2022. Continue surveillance every 3 months. D/t side effect we will hold arimidex x 2 weeks. If arthralgias improve, plan to initiate aromasin in 2 weeks. If  arthalgias are unchanged, she will continue arimidex.  2. Osteopenia- DEXA 10/28/20. T score -1.5. Calcium 1200 mg and vitamin D 1000 iu daily recommended. Did not tolerate zometa d/t arthalgias. Plan to switch to xgeva/prolia. Labs today reviewed and acceptable for treatment. Next dose in 6 months. Plan to repeat dex in 2024.  3. Leukocytosis- etiology unclear. ANC elevated at 13. Clinically asymptomatic. Monitor.  4. 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 Monitor.  5. Family history of VTE & CV disease- previously considered varicose vein surgery and was concerned about potential increase in vte risk. Aspirin 81 mg daily was recommended. She has seen vascular surgery and elected to continue conservative compression and elevation indefinitely. Encouraged lipid control to reduce progression of atherosclerotic disease.   All questions were answered. The patient knows to call the clinic with any problems questions or concerns.  Return of visit: 3 months, lab (cbc, cmp), Dr Darlyn Chamber, [place], [place] at [facility] [phone] (clinic) 09/01/2021

## 2021-09-06 ENCOUNTER — Other Ambulatory Visit: Payer: Self-pay | Admitting: Family Medicine

## 2021-09-06 NOTE — Telephone Encounter (Signed)
Medication Refill - Medication: traMADol (ULTRAM) 50 MG tablet 90 day Has the patient contacted their pharmacy? No. (She ays Dr Rosanna Randy told her to call.  Preferred Pharmacy (with phone number or street name): [id number] [address], [place] AT [facility]  Has the patient been seen for an appointment in the last year OR does the patient have an upcoming appointment? Yes.   And she took last one today.  Agent: Please be advised that RX refills may take up to 3 business days. We ask that you follow-up with your pharmacy.

## 2021-09-07 NOTE — Telephone Encounter (Signed)
Requested medication (s) are due for refill today:yes}  Requested medication (s) are on the active medication list: yes    Last refill: 02/17/21   #100  1 refill  Future visit scheduled no  Notes to clinic:not delegated  Requested Prescriptions  Pending Prescriptions Disp Refills   traMADol (ULTRAM) 50 MG tablet 100 tablet 1    Sig: Take 1 tablet (50 mg total) by mouth every 6 (six) hours as needed for severe pain.     Not Delegated - Analgesics:  Opioid Agonists Failed - 09/07/2021  1:50 PM      Failed - This refill cannot be delegated      Failed - Urine Drug Screen completed in last 360 days      Passed - Valid encounter within last 6 months    Recent Outpatient Visits           1 month ago Essential hypertension   [facility] Jerrol Banana., MD   1 month ago [place] Quervain's disease (tenosynovitis)   [facility] Thedore Mins, [place], PA-C   5 months ago Essential hypertension   [facility] Jerrol Banana., MD   8 months ago Essential hypertension   [facility] Jerrol Banana., MD   1 year ago Encounter for annual wellness visit (AWV) in Medicare patient   [facility] Jerrol Banana., MD       Future Appointments             In 5 months Jerrol Banana., MD [facility], [place]

## 2021-09-08 MED ORDER — TRAMADOL HCL 50 MG PO TABS: 50.0000 mg | ORAL_TABLET | Freq: Four times a day (QID) | ORAL | 1 refills | Status: DC | PRN

## 2021-09-08 NOTE — Telephone Encounter (Signed)
Pt following up on Tramadol 50 mg.  Pt states she would appreciate the refill please.

## 2021-09-08 NOTE — Telephone Encounter (Signed)
LOV:  08/05/2021  NOV: 02/08/2022  Thanks,   -Mickel Baas

## 2021-09-21 ENCOUNTER — Telehealth: Payer: Self-pay | Admitting: *Deleted

## 2021-09-21 NOTE — Telephone Encounter (Signed)
Patient called and left voice mail message that she was told to call Lauren about changes in medicine and she is requesting Lauren to return her call to discuss this

## 2021-09-23 ENCOUNTER — Encounter: Payer: Self-pay | Admitting: Oncology

## 2021-09-27 ENCOUNTER — Ambulatory Visit: Payer: Medicare PPO | Admitting: Obstetrics & Gynecology

## 2021-09-28 ENCOUNTER — Ambulatory Visit: Payer: Medicare PPO | Admitting: Obstetrics & Gynecology

## 2021-09-28 ENCOUNTER — Other Ambulatory Visit: Payer: Self-pay | Admitting: Obstetrics & Gynecology

## 2021-09-29 DIAGNOSIS — H16223 Keratoconjunctivitis sicca, not specified as Sjogren's, bilateral: Secondary | ICD-10-CM | POA: Diagnosis not present

## 2021-09-29 DIAGNOSIS — H524 Presbyopia: Secondary | ICD-10-CM | POA: Diagnosis not present

## 2021-10-04 ENCOUNTER — Encounter: Payer: Self-pay | Admitting: Obstetrics & Gynecology

## 2021-10-04 ENCOUNTER — Ambulatory Visit: Payer: Medicare PPO | Admitting: Obstetrics & Gynecology

## 2021-10-04 ENCOUNTER — Other Ambulatory Visit: Payer: Self-pay

## 2021-10-04 VITALS — BP 130/80 | Ht 62.5 in | Wt 182.0 lb

## 2021-10-04 DIAGNOSIS — N3281 Overactive bladder: Secondary | ICD-10-CM

## 2021-10-04 DIAGNOSIS — N8111 Cystocele, midline: Secondary | ICD-10-CM | POA: Diagnosis not present

## 2021-10-04 DIAGNOSIS — N993 Prolapse of vaginal vault after hysterectomy: Secondary | ICD-10-CM

## 2021-10-04 MED ORDER — SOLIFENACIN SUCCINATE 10 MG PO TABS: 10.0000 mg | ORAL_TABLET | Freq: Every day | ORAL | 3 refills | Status: DC

## 2021-10-04 NOTE — Patient Instructions (Signed)
Solifenacin tablets What is this medication? SOLIFENACIN (sol i FEN a cin) is used to treat overactive bladder. This medicine reduces the amount of bathroom visits. It may also help to control wetting accidents. It may be used alone, but sometimes may be given with other treatments. This medicine may be used for other purposes; ask your health care provider or pharmacist if you have questions. COMMON BRAND NAME(S): VESIcare What should I tell my care team before I take this medication? They need to know if you have any of these conditions: glaucoma history of irregular heartbeat kidney disease liver disease problems urinating prostate disease stomach or intestine problems an unusual or allergic reaction to solifenacin, other medicines, foods, dyes, or preservatives pregnant or trying to get pregnant breast-feeding How should I use this medication? Take this medicine by mouth with a glass of water. Follow the directions on the prescription label. Swallow the tablets whole. You can take it with or without food. If it upsets your stomach, take it with food. Take your doses at regular intervals. Do not take your medicine more often than directed. Do not stop taking except on the advice of your doctor or health care professional. Talk to your pediatrician regarding the use of this medicine in children. Special care may be needed. Overdosage: If you think you have taken too much of this medicine contact a poison control center or emergency room at once. NOTE: This medicine is only for you. Do not share this medicine with others. What if I miss a dose? If you miss a dose, take it as soon as you can. If it is almost time for your next dose, take only that dose. Do not take double or extra doses. What may interact with this medication? Do not take this medicine with any of the following medications: cisapride dronedarone pimozide thioridazine This medicine may also interact with the following  medications: atropine certain other medicines for bladder problems like oxybutynin, tolterodine certain medicines for fungal infections like fluconazole, itraconazole, ketoconazole, posaconazole, voriconazole certain medicines for travel sickness like scopolamine other medicines that prolong the QT interval (cause an abnormal heart rhythm) like dofetilide, ziprasidone rifampin St. John's Wort This list may not describe all possible interactions. Give your health care provider a list of all the medicines, herbs, non-prescription drugs, or dietary supplements you use. Also tell them if you smoke, drink alcohol, or use illegal drugs. Some items may interact with your medicine. What should I watch for while using this medication? Visit your doctor or health care professional for regular checks on your progress. You may need to limit your intake tea, coffee, caffeinated sodas, and alcohol. These drinks may make your symptoms worse. You may get drowsy or dizzy. Do not drive, use machinery, or do anything that needs mental alertness until you know how this medicine affects you. Do not stand or sit up quickly, especially if you are an older patient. This reduces the risk of dizzy or fainting spells. Your mouth may get dry. Chewing sugarless gum or sucking hard candy, and drinking plenty of water may help. Contact your doctor if the problem does not go away or is severe. This medicine may cause dry eyes and blurred vision. If you wear contact lenses, you may feel some discomfort. Lubricating drops may help. See your eye care professional if the problem does not go away or is severe. Avoid extreme heat. This medicine can cause you to sweat less than normal. Your body temperature could increase to dangerous  levels, which may lead to heat stroke. What side effects may I notice from receiving this medication? Side effects that you should report to your doctor or health care professional as soon as  possible: allergic reactions like skin rash, itching or hives, swelling of the face, lips, or tongue breathing problems changes in vision confusion eye pain fast, irregular heartbeat feeling faint or lightheaded, falls hallucinations, loss of contact with reality signs and symptoms of heat stroke such as decreased sweating; high temperature or fever; nausea; unusually weak or tired trouble passing urine or change in the amount of urine Side effects that usually do not require medical attention (report to your doctor or health care professional if they continue or are bothersome): constipation dizziness drowsiness headache stomach upset This list may not describe all possible side effects. Call your doctor for medical advice about side effects. You may report side effects to FDA at 1-800-FDA-1088. Where should I keep my medication? Keep out of the reach of children. Store at room temperature between 15 and 30 degrees C (59 and 86 degrees F). Throw away any unused medicine after the expiration date. NOTE: This sheet is a summary. It may not cover all possible information. If you have questions about this medicine, talk to your doctor, pharmacist, or health care provider.  2022 Elsevier/Gold Standard (2018-12-25 00:00:00)

## 2021-10-04 NOTE — Progress Notes (Signed)
HPI:      [name] is a 81 y.o. [id number] who presents today for her pessary follow up and examination related to her pelvic floor weakening.  Pt reports tolerating the pessary well with no vaginal bleeding and no vaginal discharge.  Symptoms of pelvic floor weakening have greatly improved. She is voiding and defecating without difficulty. She currently has a Cup type pessary.  PMHx: She  has a past medical history of Anemia, Anxiety, Arthritis, BRCA negative (08/04/2015), Breast cancer ([place]) (2002), Breast cancer ([place]) (09/2020), Cataract, GERD (gastroesophageal reflux disease), Heart murmur (2001), Hiatal hernia, High cholesterol, Hypertension, Personal history of radiation therapy (2002, 2022), Rosacea, and 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 complications. Also,  has a past surgical history that includes Stab pheblectomy  (2010); vein closure procedure (Bilateral, 2008); Abdominal hysterectomy; Cholecystectomy; Colonoscopy (2013); post radiation therapy (Right); Colonoscopy with propofol (N/A, 12/08/2015); Esophagogastroduodenoscopy (egd) with propofol (N/A, 12/08/2015); Colonoscopy with propofol (N/A, 12/06/2017); Esophagogastroduodenoscopy (egd) with propofol (N/A, 12/06/2017); Colonoscopy with propofol (N/A, 12/22/2017); Esophagogastroduodenoscopy (egd) with propofol (N/A, 12/22/2017); Givens capsule study (N/A, 01/23/2018); Blepharoplasty; Breast excisional biopsy (Right, 2002); Breast surgery (Right, 2002); Breast cyst aspiration (Left, 2003); Breast biopsy (Left, 09/15/2020); Eye surgery (Right, 2014); Eye surgery (Left, 2014); iron infusion; Breast lumpectomy with needle localization (Left, 10/07/2020); Breast lumpectomy (Right, 2002); Breast lumpectomy (Left, 10/07/2020); Breast lumpectomy (Left); Cataract extraction w/ intraocular lens  implant, bilateral; and Re-excision of breast cancer,superior margins (Left, 11/09/2020)., family history includes Breast cancer in her cousin;  Breast cancer (age of onset: 26) in an other family member; Breast cancer (age of onset: 76) in her cousin; Breast cancer (age of onset: 83) in her maternal aunt; Cancer (age of onset: 25) in her cousin; Heart attack in her brother, father, mother, and sister; Other in her brother; Stroke in her sister.,  reports that she has never smoked. She has never used smokeless tobacco. She reports that she does not drink alcohol and does not use drugs.  She has a current medication list which includes the following prescription(s): amlodipine, atorvastatin, calcium citrate, docusate sodium, exemestane, ezetimibe, glucosamine, glycerin-polysorbate 80, multivitamin with minerals, omeprazole, psyllium, solifenacin, soolantra, spironolactone, tramadol, and anastrozole, and the following Facility-Administered Medications: sodium chloride. Also, has No Known Allergies.  Review of Systems  All other systems reviewed and are negative.  Objective: BP 130/80    Ht 5' 2.5" (1.588 m)    Wt 182 lb (82.6 kg)    BMI 32.76 kg/m  Physical Exam Constitutional:      General: She is not in acute distress.    Appearance: She is well-developed.  Genitourinary:     Bladder and urethral meatus normal.     Genitourinary Comments: Cuff intact/ no lesions  Absent uterus and cervix     Vaginal cuff intact.    No vaginal erythema or bleeding.     Apical vaginal prolapse present.    Mild vaginal atrophy present.     Right Adnexa: not tender and no mass present.    Left Adnexa: not tender and no mass present.    Cervix is absent.     Uterus is absent.     Pelvic exam was performed with patient in the lithotomy position.  HENT:     Head: Normocephalic and atraumatic.     Nose: Nose normal.  Abdominal:     General: There is no distension.     Palpations: Abdomen is soft.     Tenderness: There is no abdominal tenderness.  Musculoskeletal:        General: Normal range of motion.  Neurological:     Mental Status: She is  alert and oriented to person, place, and time.     Cranial Nerves: No cranial nerve deficit.  Skin:    General: Skin is warm and dry.  Psychiatric:        Attention and Perception: Attention normal.        Mood and Affect: Mood normal.        Speech: Speech normal.        Behavior: Behavior normal.        Cognition and Memory: Cognition normal.        Judgment: Judgment normal.    Pessary Care Pessary removed and cleaned.  Vagina checked - without erosions - pessary replaced.  A/P:   ICD-10-CM   1. 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 Cystocele, midline  N81.11     3. Overactive bladder  N32.81      Pessary was cleaned and replaced today. Instructions given for care. Concerning symptoms to observe for are counseled to patient. Follow up scheduled for 6 months.  Will change from Detrol to Vesicare to see if improves sx's and not have significant dry mouth or other side effects, for OAB  A total of 20 minutes were spent face-to-face with the patient as well as preparation, review, communication, and documentation during this encounter.   Barnett Applebaum, MD, [facility], [facility] 10/04/2021  11:21 AM

## 2021-10-27 IMAGING — MG MM BREAST SURGICAL SPECIMEN
1 series · 1 of 1 positions shown · non-contrast
Comparison: Previous exam(s).

CLINICAL DATA: Status post lumpectomy after earlier needle
localization.

EXAM:
SPECIMEN RADIOGRAPH OF THE LEFT BREAST

[L]
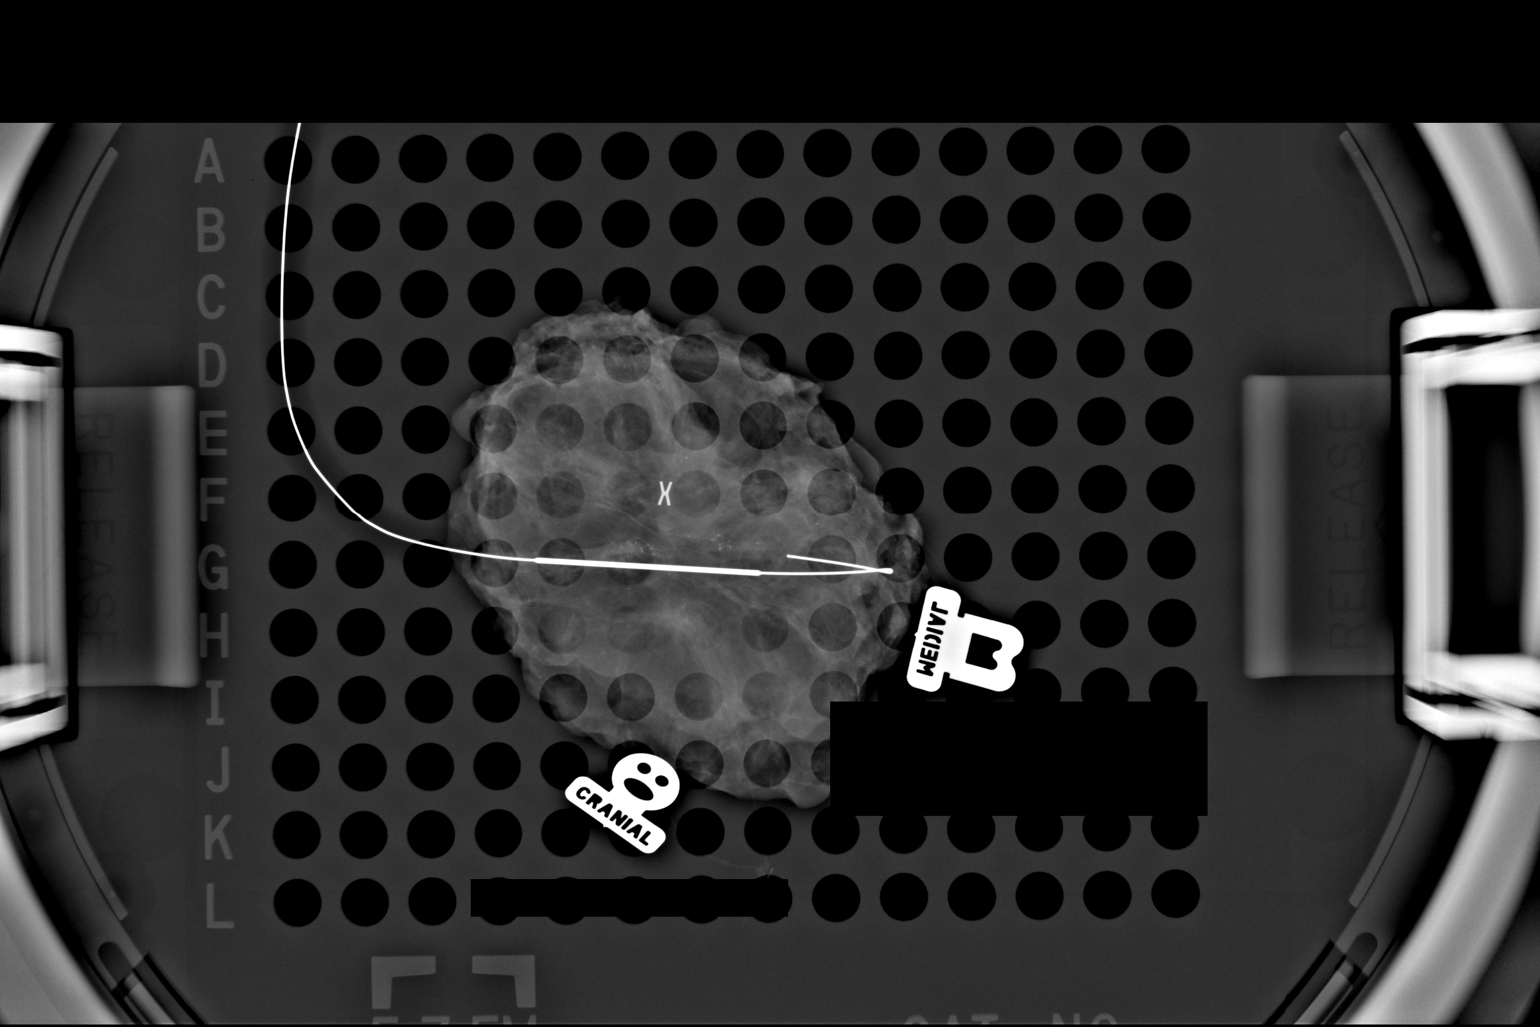

[1 of 1 positions shown; findings below may reference images not displayed]

FINDINGS: Status post excision of the left breast. The wire tip and biopsy
marker clip are present and are marked for pathology. Findings
discussed with Dr. Ava in the OR at the time of the procedure.
IMPRESSION: Specimen radiograph of the left breast.

## 2021-10-27 IMAGING — MG MM PLC BREAST LOC DEV 1ST LESION INC MAMMO GUIDE*L*
5 series · 5 of 5 positions shown · non-contrast
Comparison: Previous exams.

CLINICAL DATA: Patient with LEFT breast DCIS scheduled for
lumpectomy today requiring preoperative needle localization

EXAM:
NEEDLE LOCALIZATION OF THE LEFT BREAST WITH MAMMO GUIDANCE

[L CC (1 of 3)]
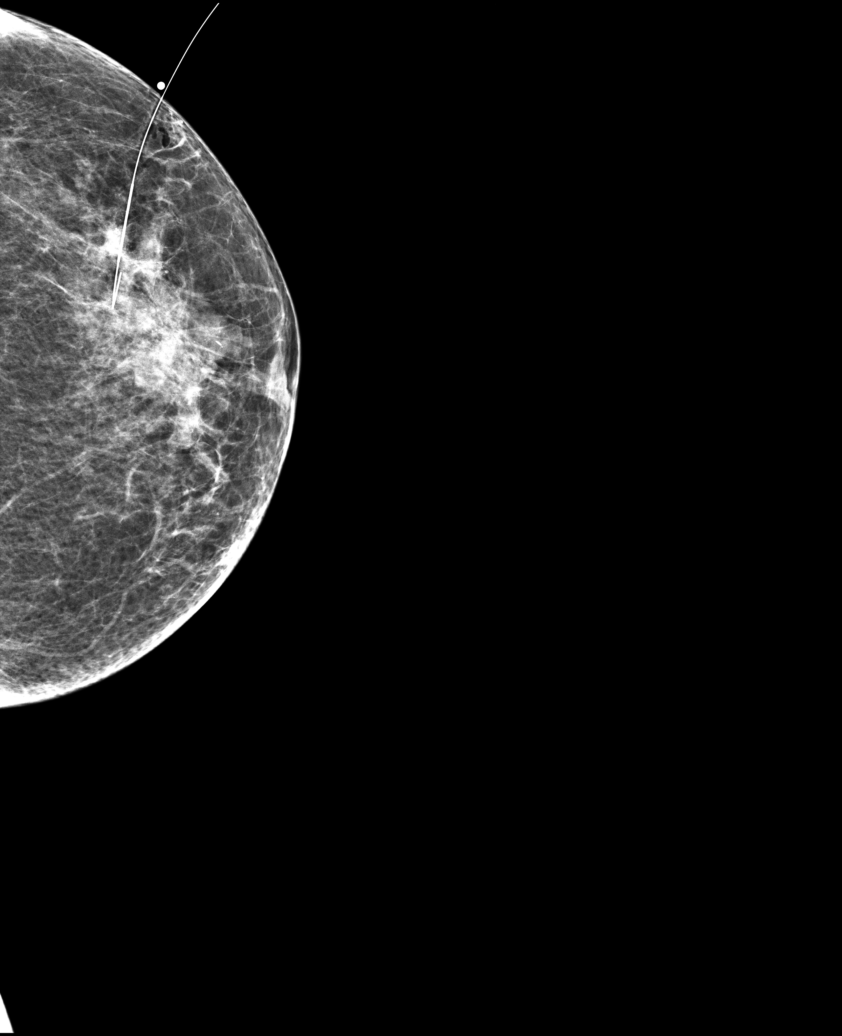

[L CC (2 of 3)]
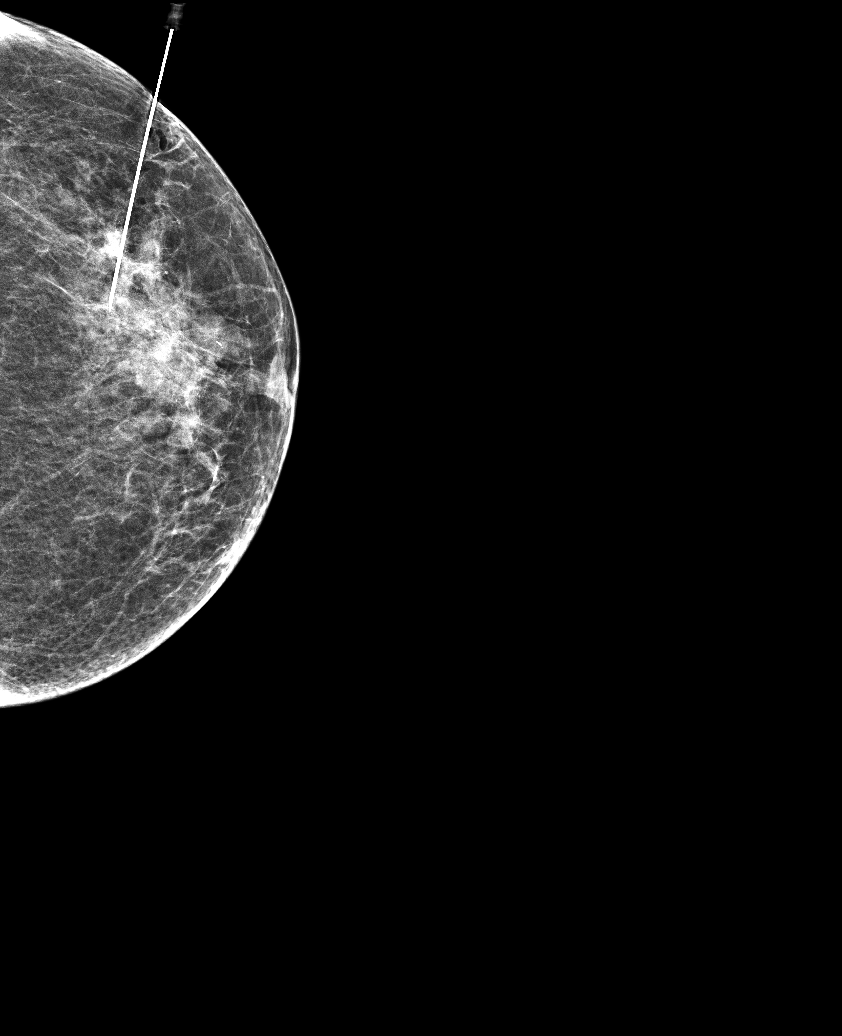

[L CC (3 of 3)]
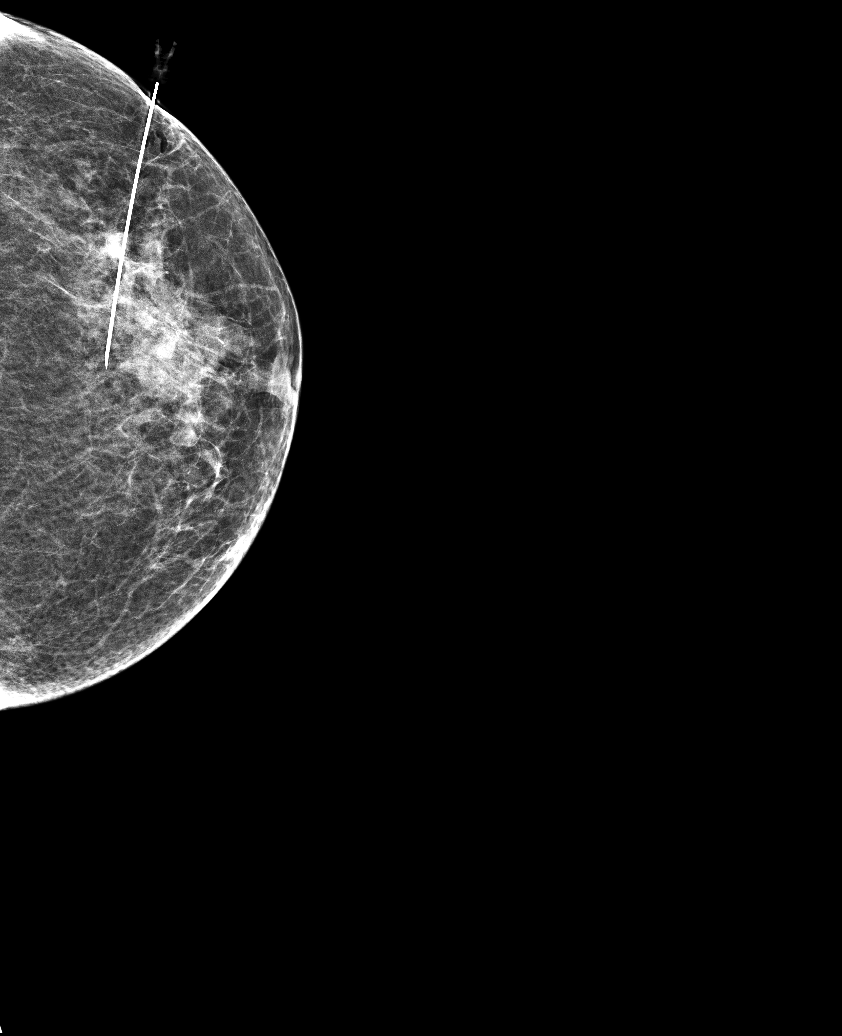

[L LM (1 of 2)]
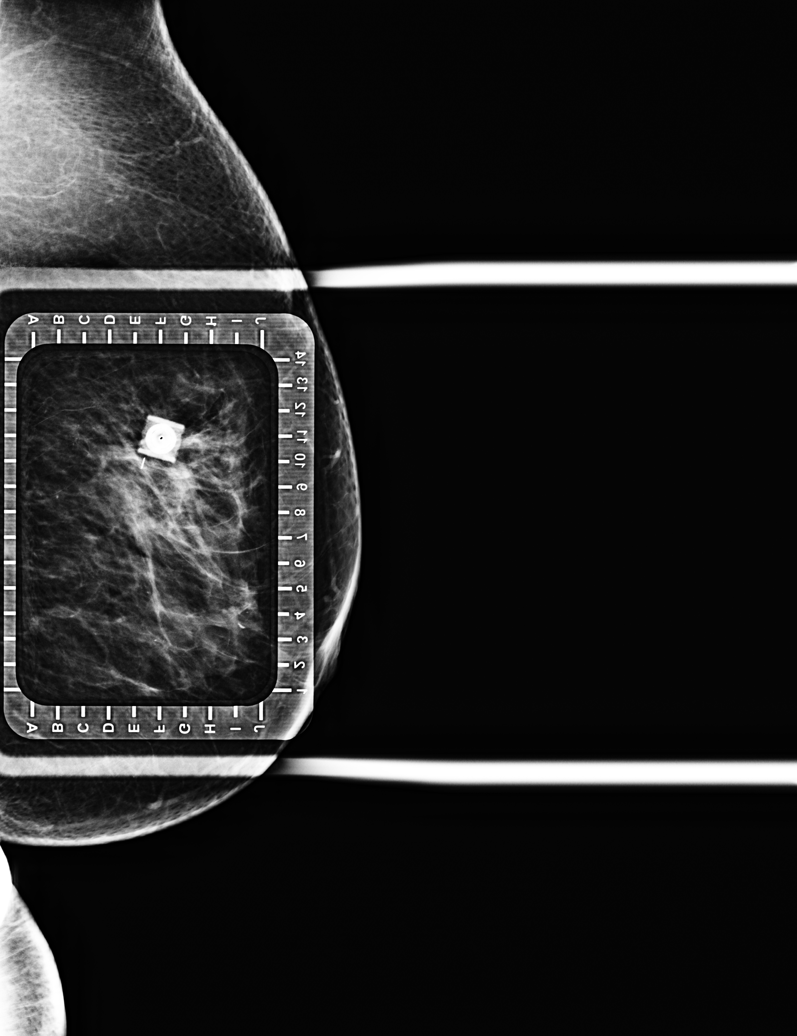

[L LM (2 of 2)]
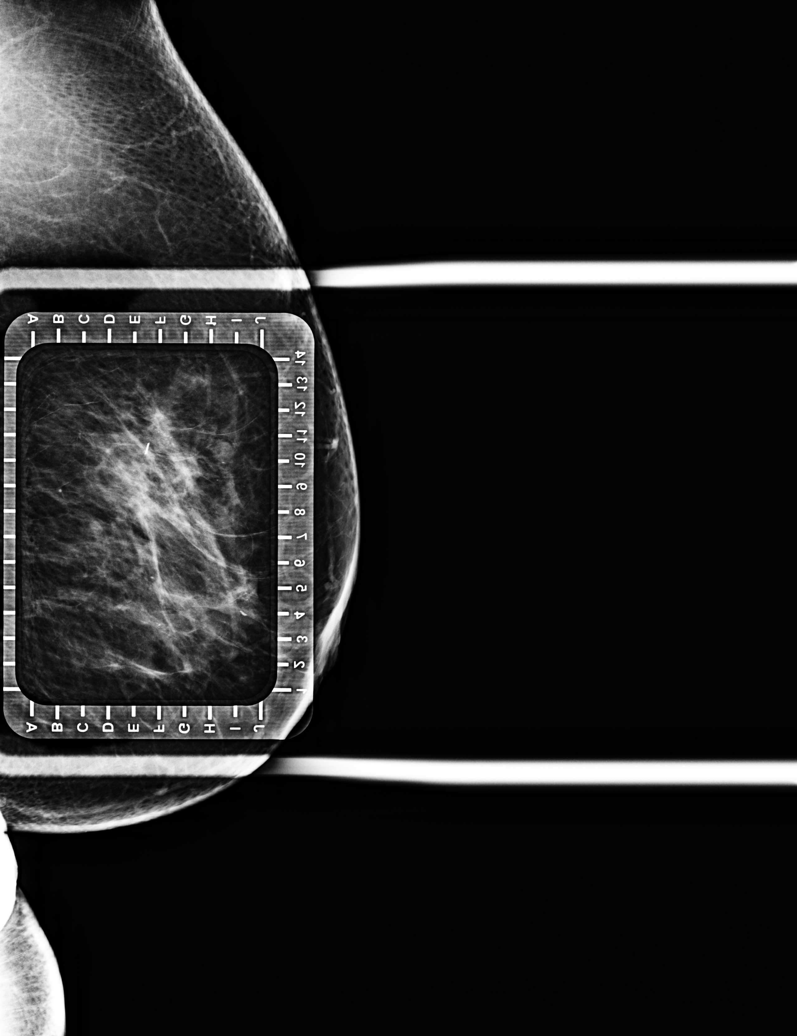

[5 of 5 positions shown; findings below may reference images not displayed]

PROCEDURE:
Patient presents for needle localization prior to lumpectomy. I met
with the patient and we discussed the procedure of needle
localization including benefits and alternatives. We discussed the
high likelihood of a successful procedure. We discussed the risks of
the procedure, including infection, bleeding, tissue injury, and
further surgery. Informed, written consent was given. The usual
time-out protocol was performed immediately prior to the procedure.

Using mammographic guidance, sterile technique, 1% lidocaine and a 7
cm modified Kopans needle, the X shaped clip within the upper-outer
quadrant of the LEFT breast was localized using lateral approach.
The images were marked for Dr. Etoil. Localization wire was placed
just superior to the clip, corresponding to the site of the biopsied
calcifications. This location was discussed with Dr. Etoil at the
conclusion of the procedure.
IMPRESSION: Needle localization LEFT breast. No apparent complications.

## 2021-11-23 ENCOUNTER — Other Ambulatory Visit: Payer: Self-pay | Admitting: Family Medicine

## 2021-11-24 NOTE — Telephone Encounter (Signed)
Requested medication (s) are due for refill today: yes ? ?Requested medication (s) are on the active medication list: yes ? ?Last refill:  09/08/21 #100/1 ? ?Future visit scheduled: yes ? ?Notes to clinic:  Unable to refill per protocol, cannot delegate. ? ? ? ?  ?Requested Prescriptions  ?Pending Prescriptions Disp Refills  ? traMADol (ULTRAM) 50 MG tablet [Pharmacy Med Name: TRAMADOL HYDROCHLORIDE 50 MG Tablet] 100 tablet   ?  Sig: Take 1 tablet (50 mg total) by mouth every 6 (six) hours as needed for severe pain.  ?  ? Not Delegated - Analgesics:  Opioid Agonists Failed - 11/23/2021  1:22 PM  ?  ?  Failed - This refill cannot be delegated  ?  ?  Failed - Urine Drug Screen completed in last 360 days  ?  ?  Failed - Valid encounter within last 3 months  ?  Recent Outpatient Visits   ? ?      ? 3 months ago Essential hypertension  ? [facility] Jerrol Banana., MD  ? 4 months ago Tennis Must Quervain's disease (tenosynovitis)  ? [facility] Thedore Mins, Ria Comment, PA-C  ? 7 months ago Essential hypertension  ? [facility] Jerrol Banana., MD  ? 11 months ago Essential hypertension  ? [facility] Jerrol Banana., MD  ? 1 year ago Encounter for annual wellness visit (AWV) in Medicare patient  ? [facility] Jerrol Banana., MD  ? ?  ?  ?Future Appointments   ? ?        ? In 2 months Jerrol Banana., MD [facility]  ? ?  ? ?  ?  ?  ? ?

## 2021-11-30 ENCOUNTER — Ambulatory Visit: Payer: Medicare PPO | Admitting: Oncology

## 2021-11-30 ENCOUNTER — Other Ambulatory Visit: Payer: Medicare PPO

## 2021-12-06 ENCOUNTER — Ambulatory Visit (INDEPENDENT_AMBULATORY_CARE_PROVIDER_SITE_OTHER): Payer: Medicare PPO

## 2021-12-06 VITALS — Wt 182.0 lb

## 2021-12-06 DIAGNOSIS — Z0000 Encounter for general adult medical examination without abnormal findings: Secondary | ICD-10-CM

## 2021-12-06 NOTE — Progress Notes (Signed)
Virtual Visit via Telephone Note  I connected with  Claire Kane on 12/06/21 at  1:30 PM EDT by telephone and verified that I am speaking with the correct person using two identifiers.  Location: Patient: home Provider: [facility] Persons participating in the virtual visit: Claire Kane   I discussed the limitations, risks, security and privacy concerns of performing an evaluation and management service by telephone and the availability of in person appointments. The patient expressed understanding and agreed to proceed.  Interactive audio and video telecommunications were attempted between this nurse and patient, however failed, due to patient having technical difficulties OR patient did not have access to video capability.  We continued and completed visit with audio only.  Some vital signs may be absent or patient reported.   Claire David, LPN  Subjective:   Claire Kane is a 81 y.o. female who presents for Medicare Annual (Subsequent) preventive examination.  Review of Systems           Objective:    Today's Vitals   12/06/21 1327  Weight: 182 lb (82.6 kg)   Body mass index is 32.76 kg/m.  Advanced Directives 09/01/2021 07/19/2021 02/17/2021 02/08/2021 11/16/2020 10/29/2020 10/07/2020  Does Patient Have a Medical Advance Directive? Yes Yes Yes No Yes Yes Yes  Type of Paramedic of [place];Living will [place];Living will Living will;Healthcare Power of [place];Living will [place];Living will [place];Living will  Does patient want to make changes to medical advance directive? - No - Patient declined - - - No - Patient declined No - Patient declined  Copy of [place] in Chart? No - copy requested No - copy requested - - No - copy requested No - copy requested No - copy requested  Would patient like information on creating a  medical advance directive? - - - No - Patient declined - - -    Current Medications (verified) Outpatient Encounter Medications as of 12/06/2021  Medication Sig   doxycycline (ORACEA) 40 MG capsule Take by mouth.   amLODipine (NORVASC) 5 MG tablet Take 1 tablet (5 mg total) by mouth daily.   anastrozole (ARIMIDEX) 1 MG tablet Take 1 tablet (1 mg total) by mouth daily. (Patient not taking: Reported on 10/04/2021)   atorvastatin (LIPITOR) 10 MG tablet TAKE 1 TABLET EVERY DAY   calcium citrate (CALCITRATE - DOSED IN MG ELEMENTAL CALCIUM) 950 MG tablet Take 200 mg of elemental calcium by mouth 2 (two) times daily.    docusate sodium (COLACE) 100 MG capsule Take 100 mg by mouth daily as needed for mild constipation.   doxycycline (PERIOSTAT) 20 MG tablet doxycycline hyclate 20 mg tablet  TAKE 1 TABLET BY MOUTH TWICE DAILY   Eflornithine HCl (VANIQA) 13.9 % cream Vaniqa 13.9 % topical cream  APPLY TO UNWANTED HAIR TWICE DAILY   exemestane (AROMASIN) 25 MG tablet Take 1 tablet (25 mg total) by mouth daily after breakfast.   ezetimibe (ZETIA) 10 MG tablet TAKE 1 TABLET EVERY DAY   Glucosamine 750 MG TABS Take 750 mg by mouth daily.   Glycerin-Polysorbate 80 (REFRESH DRY EYE THERAPY OP) Place 1 drop into both eyes 2 (two) times daily.   Multiple Vitamins-Minerals (MULTIVITAMIN WITH MINERALS) tablet Take 1 tablet by mouth daily.   omeprazole (PRILOSEC) 20 MG capsule Take 1 capsule (20 mg total) by mouth daily.   psyllium (REGULOID) 0.52 G capsule Take 2  capsules by mouth daily.   solifenacin (VESICARE) 10 MG tablet Take 1 tablet (10 mg total) by mouth daily.   SOOLANTRA 1 % CREA Apply 1 application topically daily as needed (rosacea).   spironolactone (ALDACTONE) 50 MG tablet Take 50 mg by mouth daily.   traMADol (ULTRAM) 50 MG tablet TAKE 1 TABLET (50 MG TOTAL) BY MOUTH EVERY 6 (SIX) HOURS AS NEEDED FOR SEVERE PAIN.   Facility-Administered Encounter Medications as of 12/06/2021  Medication   0.9 %   sodium chloride infusion    Allergies (verified) Patient has no known allergies.   History: Past Medical History:  Diagnosis Date   Anemia    fe deficiency now improved at age 89   Anxiety    Arthritis    BRCA negative 08/04/2015   BRCA1 and BRCA2   Breast cancer ([place]) 2002   DCIS right breast    Breast cancer ([place]) 09/2020   left breast   Cataract    GERD (gastroesophageal reflux disease)    Heart murmur 2001   Hiatal hernia    High cholesterol    Hypertension    patient denies anyone telling her that she has htn   Personal history of radiation therapy 2002, 2022   BREAST CA   Rosacea    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 complications    Past Surgical History:  Procedure Laterality Date   ABDOMINAL HYSTERECTOMY     BLEPHAROPLASTY     BREAST BIOPSY Left 09/15/2020   affirm bx, x marker, DCIS   BREAST CYST ASPIRATION Left 2003   BREAST EXCISIONAL BIOPSY Right 2002   POS   BREAST LUMPECTOMY Right 2002   w/ radiation   BREAST LUMPECTOMY Left 10/07/2020   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   BREAST LUMPECTOMY Left    re-excision   BREAST LUMPECTOMY WITH NEEDLE LOCALIZATION Left 10/07/2020   Procedure: BREAST LUMPECTOMY WITH NEEDLE LOCALIZATION;  Surgeon: Robert Bellow, MD;  Location: [facility] ORS;  Service: General;  Laterality: Left;   BREAST SURGERY Right 2002   lumpectomy   CATARACT EXTRACTION W/ INTRAOCULAR LENS  IMPLANT, BILATERAL     CHOLECYSTECTOMY     COLONOSCOPY  2013   COLONOSCOPY WITH PROPOFOL N/A 12/08/2015   Procedure: COLONOSCOPY WITH PROPOFOL;  Surgeon: Christene Lye, MD;  Location: [facility] ENDOSCOPY;  Service: Endoscopy;  Laterality: N/A;   COLONOSCOPY WITH PROPOFOL N/A 12/06/2017   Procedure: COLONOSCOPY WITH PROPOFOL;  Surgeon: Jonathon Bellows, MD;  Location: [facility] ENDOSCOPY;  Service: Gastroenterology;  Laterality: N/A;   COLONOSCOPY WITH PROPOFOL N/A  12/22/2017   Procedure: COLONOSCOPY WITH PROPOFOL;  Surgeon: Jonathon Bellows, MD;  Location: [facility] ENDOSCOPY;  Service: Gastroenterology;  Laterality: N/A;   ESOPHAGOGASTRODUODENOSCOPY (EGD) WITH PROPOFOL N/A 12/08/2015   Procedure: ESOPHAGOGASTRODUODENOSCOPY (EGD) WITH PROPOFOL;  Surgeon: Christene Lye, MD;  Location: [facility] ENDOSCOPY;  Service: Endoscopy;  Laterality: N/A;   ESOPHAGOGASTRODUODENOSCOPY (EGD) WITH PROPOFOL N/A 12/06/2017   Procedure: ESOPHAGOGASTRODUODENOSCOPY (EGD) WITH PROPOFOL;  Surgeon: Jonathon Bellows, MD;  Location: [facility] ENDOSCOPY;  Service: Gastroenterology;  Laterality: N/A;   ESOPHAGOGASTRODUODENOSCOPY (EGD) WITH PROPOFOL N/A 12/22/2017   Procedure: ESOPHAGOGASTRODUODENOSCOPY (EGD) WITH PROPOFOL;  Surgeon: Jonathon Bellows, MD;  Location: [facility] ENDOSCOPY;  Service: Gastroenterology;  Laterality: N/A;   EYE SURGERY Right 2014   EYE SURGERY Left 2014   GIVENS CAPSULE STUDY N/A 01/23/2018   Procedure: GIVENS CAPSULE STUDY;  Surgeon: Jonathon Bellows, MD;  Location: [facility] ENDOSCOPY;  Service: Gastroenterology;  Laterality: N/A;   iron infusion     had 5 iron infusions for anemia   post radiation therapy Right    right breast cancer 2002   RE-EXCISION OF BREAST CANCER,SUPERIOR MARGINS Left 11/09/2020   Procedure: RE-EXCISION OF BREAST CANCER,SUPERIOR MARGINS;  Surgeon: Robert Bellow, MD;  Location: [facility] ORS;  Service: General;  Laterality: Left;   Stab pheblectomy   2010   vein closure procedure Bilateral 2008   Family History  Problem Relation Age of Onset   Heart attack Mother    Heart attack Father    Heart attack Sister    Stroke Sister    Heart attack Brother    Other Brother        cerebral hemorrhage, sepsis   Breast cancer Cousin 81       breast/maternal   Cancer Cousin 76       breast/maternal   Breast cancer Cousin    Breast cancer Maternal Aunt 70   Breast cancer Other 40       maternal neice   Social History   Socioeconomic History   Marital status:  Married    Spouse name: Fritz Pickerel   Number of children: 2   Years of education: Not on file   Highest education level: Bachelor's degree (e.g., BA, AB, BS)  Occupational History   Occupation: Pharmacist, hospital    Comment: retired  Tobacco Use   Smoking status: Never   Smokeless tobacco: Never  Vaping Use   Vaping Use: Never used  Substance and Sexual Activity   Alcohol use: No   Drug use: No   Sexual activity: Never  Other Topics Concern   Not on file  Social History Narrative   Patient lives with husband. She feels safe in her home.   Social Determinants of Health   Financial Resource Strain: Not on file  Food Insecurity: Not on file  Transportation Needs: Not on file  Physical Activity: Not on file  Stress: Not on file  Social Connections: Not on file    Tobacco Counseling Counseling given: Not Answered   Clinical Intake:  Pre-visit preparation completed: Yes  Pain : No/denies pain     Nutritional Risks: None Diabetes: No  How often do you need to have someone help you when you read instructions, pamphlets, or other written materials from your doctor or pharmacy?: 1 - Never  Diabetic?no  Interpreter Needed?: No  Information entered by :: Kirke Shaggy, LPN   Activities of Daily Living In your present state of health, do you have any difficulty performing the following activities: 07/21/2021 04/08/2021  Hearing? N N  Vision? N N  Difficulty concentrating or making decisions? Tempie Donning  Comment remembering -  Walking or climbing stairs? Y Y  Comment climbing steps -  Dressing or bathing? N N  Doing errands, shopping? N N  Some recent data might be hidden    Patient Care Team: Jerrol Banana., MD as PCP - General (Family Medicine) Dingeldein, Remo Lipps, MD as Consulting Physician (Ophthalmology) Gae Dry, MD as Referring Physician (Obstetrics and Gynecology) Earlie Server, MD as Consulting Physician (Oncology) Thomes Dinning, MD as Referring Physician  (Internal Medicine)  Indicate any recent Medical Services you may have received from other than [facility] providers in the past year (date may be approximate).     Assessment:   This is a routine wellness examination for [place].  Hearing/Vision screen No results found.  Dietary issues and exercise activities  discussed:     Goals Addressed   None    Depression Screen PHQ 2/9 Scores 07/21/2021 04/08/2021 06/17/2020 06/11/2019 06/10/2019 02/20/2018 12/13/2016  PHQ - 2 Score 0 0 0 0 0 0 0  PHQ- 9 Score 2 4 0 - 1 3 0    Fall Risk Fall Risk  07/21/2021 04/08/2021 06/17/2020 06/11/2019 06/10/2019  Falls in the past year? 1 1 0 1 1  Number falls in past yr: 0 0 0 0 0  Injury with Fall? 1 1 0 0 0  Comment - - - - -  Risk for fall due to : History of fall(s) History of fall(s);Impaired mobility;Impaired balance/gait;Orthopedic patient;Medication side effect - - -  Follow up - Falls evaluation completed Falls evaluation completed Falls prevention discussed -    FALL RISK PREVENTION PERTAINING TO THE HOME:  Any stairs in or around the home? Yes  If so, are there any without handrails? No  Home free of loose throw rugs in walkways, pet beds, electrical cords, etc? Yes  Adequate lighting in your home to reduce risk of falls? Yes   ASSISTIVE DEVICES UTILIZED TO PREVENT FALLS:  Life alert? No  Use of a cane, walker or w/c? No  Grab bars in the bathroom? Yes  Shower chair or bench in shower? Yes  Elevated toilet seat or a handicapped toilet? Yes   Cognitive Function:    6CIT Screen 06/11/2019 02/20/2018 12/13/2016  What Year? 0 points 0 points 0 points  What month? 0 points 0 points 0 points  What time? 0 points 0 points 0 points  Count back from 20 0 points 0 points 0 points  Months in reverse 0 points 0 points 0 points  Repeat phrase 0 points 0 points 0 points  Total Score 0 0 0    Immunizations Immunization History  Administered Date(s) Administered   Fluad Quad(high Dose 65+) 07/23/2020    Influenza, High Dose Seasonal PF 05/28/2015, 07/05/2017, 06/10/2019   Influenza,inj,Quad PF,6+ Mos 06/27/2018   Influenza-Unspecified 05/09/2016   PFIZER Comirnaty(Gray Top)Covid-19 Tri-Sucrose Vaccine 01/12/2021   PFIZER(Purple Top)SARS-COV-2 Vaccination 10/08/2019, 11/02/2019, 06/25/2020   Pneumococcal Conjugate-13 04/22/2014   Pneumococcal Polysaccharide-23 03/15/2011   Td 09/06/2007, 12/30/2012   Tdap 04/22/2014   Zoster, Live 06/27/2011    TDAP status: Up to date  Flu Vaccine status: Up to date  Pneumococcal vaccine status: Up to date  Covid-19 vaccine status: Completed vaccines  Qualifies for Shingles Vaccine? Yes   Zostavax completed Yes   Shingrix Completed?: No.    Education has been provided regarding the importance of this vaccine. Patient has been advised to call insurance company to determine out of pocket expense if they have not yet received this vaccine. Advised may also receive vaccine at local pharmacy or Health Dept. Verbalized acceptance and understanding.  Screening Tests Health Maintenance  Topic Date Due   Zoster Vaccines- Shingrix (1 of 2) Never done   COVID-19 Vaccine (5 - Booster for Pfizer series) 03/09/2021   INFLUENZA VACCINE  04/19/2021   TETANUS/TDAP  04/22/2024   Pneumonia Vaccine 16+ Years old  Completed   DEXA SCAN  Completed   HPV VACCINES  Aged Out    Health Maintenance  Health Maintenance Due  Topic Date Due   Zoster Vaccines- Shingrix (1 of 2) Never done   COVID-19 Vaccine (5 - Booster for Pfizer series) 03/09/2021   INFLUENZA VACCINE  04/19/2021    Colorectal cancer screening: No longer required.   Mammogram status: No longer required  due to age.    Lung Cancer Screening: (Low Dose CT Chest recommended if Age 74-80 years, 30 pack-year currently smoking OR have quit w/in 15years.) does not qualify.    Additional Screening:  Hepatitis C Screening: does not qualify; Completed no  Vision Screening: Recommended annual  ophthalmology exams for early detection of glaucoma and other disorders of the eye. Is the patient up to date with their annual eye exam?  Yes  Who is the provider or what is the name of the office in which the patient attends annual eye exams? [facility] If pt is not established with a provider, would they like to be referred to a provider to establish care? No .   Dental Screening: Recommended annual dental exams for proper oral hygiene  Community Resource Referral / Chronic Care Management: CRR required this visit?  No   CCM required this visit?  No      Plan:     I have personally reviewed and noted the following in the patient's chart:   Medical and social history Use of alcohol, tobacco or illicit drugs  Current medications and supplements including opioid prescriptions.  Functional ability and status Nutritional status Physical activity Advanced directives List of other physicians Hospitalizations, surgeries, and ER visits in previous 12 months Vitals Screenings to include cognitive, depression, and falls Referrals and appointments  In addition, I have reviewed and discussed with patient certain preventive protocols, quality metrics, and best practice recommendations. A written personalized care plan for preventive services as well as general preventive health recommendations were provided to patient.     Claire David, LPN   [id number]   Nurse Notes: none

## 2021-12-06 NOTE — Patient Instructions (Signed)
Claire Kane , ?Thank you for taking time to come for your Medicare Wellness Visit. I appreciate your ongoing commitment to your health goals. Please review the following plan we discussed and let me know if I can assist you in the future.  ? ?Screening recommendations/referrals: ?Colonoscopy: aged out ?Mammogram: aged out ?Bone Density: 10/28/20 ?Recommended yearly ophthalmology/optometry visit for glaucoma screening and checkup ?Recommended yearly dental visit for hygiene and checkup ? ?Vaccinations: ?Influenza vaccine: states had one this season ?Pneumococcal vaccine: 04/22/14 ?Tdap vaccine: 04/22/14 ?Shingles vaccine: Zostavax 06/27/11   ?Covid-19:10/08/19, 11/02/19, 06/25/20, 01/12/21, 06/13/21 ? ?Advanced directives: no ? ?Conditions/risks identified: none ? ?Next appointment: Follow up in one year for your annual wellness visit -  ? ? ?Preventive Care 75 Years and Older, Female ?Preventive care refers to lifestyle choices and visits with your health care provider that can promote health and wellness. ?What does preventive care include? ?A yearly physical exam. This is also called an annual well check. ?Dental exams once or twice a year. ?Routine eye exams. Ask your health care provider how often you should have your eyes checked. ?Personal lifestyle choices, including: ?Daily care of your teeth and gums. ?Regular physical activity. ?Eating a healthy diet. ?Avoiding tobacco and drug use. ?Limiting alcohol use. ?Practicing safe sex. ?Taking low-dose aspirin every day. ?Taking vitamin and mineral supplements as recommended by your health care provider. ?What happens during an annual well check? ?The services and screenings done by your health care provider during your annual well check will depend on your age, overall health, lifestyle risk factors, and family history of disease. ?Counseling  ?Your health care provider may ask you questions about your: ?Alcohol use. ?Tobacco use. ?Drug use. ?Emotional well-being. ?Home and  relationship well-being. ?Sexual activity. ?Eating habits. ?History of falls. ?Memory and ability to understand (cognition). ?Work and work Statistician. ?Reproductive health. ?Screening  ?You may have the following tests or measurements: ?Height, weight, and BMI. ?Blood pressure. ?Lipid and cholesterol levels. These may be checked every 5 years, or more frequently if you are over 71 years old. ?Skin check. ?Lung cancer screening. You may have this screening every year starting at age 79 if you have a 30-pack-year history of smoking and currently smoke or have quit within the past 15 years. ?Fecal occult blood test (FOBT) of the stool. You may have this test every year starting at age 35. ?Flexible sigmoidoscopy or colonoscopy. You may have a sigmoidoscopy every 5 years or a colonoscopy every 10 years starting at age 2. ?Hepatitis C blood test. ?Hepatitis B blood test. ?Sexually transmitted disease (STD) testing. ?Diabetes screening. This is done by checking your blood sugar (glucose) after you have not eaten for a while (fasting). You may have this done every 1-3 years. ?Bone density scan. This is done to screen for osteoporosis. You may have this done starting at age 35. ?Mammogram. This may be done every 1-2 years. Talk to your health care provider about how often you should have regular mammograms. ?Talk with your health care provider about your test results, treatment options, and if necessary, the need for more tests. ?Vaccines  ?Your health care provider may recommend certain vaccines, such as: ?Influenza vaccine. This is recommended every year. ?Tetanus, diphtheria, and acellular pertussis (Tdap, Td) vaccine. You may need a Td booster every 10 years. ?Zoster vaccine. You may need this after age 48. ?Pneumococcal 13-valent conjugate (PCV13) vaccine. One dose is recommended after age 82. ?Pneumococcal polysaccharide (PPSV23) vaccine. One dose is recommended after age 37. ?  Talk to your health care provider  about which screenings and vaccines you need and how often you need them. ?This information is not intended to replace advice given to you by your health care provider. Make sure you discuss any questions you have with your health care provider. ?Document Released: 10/02/2015 Document Revised: 05/25/2016 Document Reviewed: 07/07/2015 ?Elsevier Interactive Patient Education ? 2017 [place]. ? ?Fall Prevention in the Home ?Falls can cause injuries. They can happen to people of all ages. There are many things you can do to make your home safe and to help prevent falls. ?What can I do on the outside of my home? ?Regularly fix the edges of walkways and driveways and fix any cracks. ?Remove anything that might make you trip as you walk through a door, such as a raised step or threshold. ?Trim any bushes or trees on the path to your home. ?Use bright outdoor lighting. ?Clear any walking paths of anything that might make someone trip, such as rocks or tools. ?Regularly check to see if handrails are loose or broken. Make sure that both sides of any steps have handrails. ?Any raised decks and porches should have guardrails on the edges. ?Have any leaves, snow, or ice cleared regularly. ?Use sand or salt on walking paths during winter. ?Clean up any spills in your garage right away. This includes oil or grease spills. ?What can I do in the bathroom? ?Use night lights. ?Install grab bars by the toilet and in the tub and shower. Do not use towel bars as grab bars. ?Use non-skid mats or decals in the tub or shower. ?If you need to sit down in the shower, use a plastic, non-slip stool. ?Keep the floor dry. Clean up any water that spills on the floor as soon as it happens. ?Remove soap buildup in the tub or shower regularly. ?Attach bath mats securely with double-sided non-slip rug tape. ?Do not have throw rugs and other things on the floor that can make you trip. ?What can I do in the bedroom? ?Use night lights. ?Make sure  that you have a light by your bed that is easy to reach. ?Do not use any sheets or blankets that are too big for your bed. They should not hang down onto the floor. ?Have a firm chair that has side arms. You can use this for support while you get dressed. ?Do not have throw rugs and other things on the floor that can make you trip. ?What can I do in the kitchen? ?Clean up any spills right away. ?Avoid walking on wet floors. ?Keep items that you use a lot in easy-to-reach places. ?If you need to reach something above you, use a strong step stool that has a grab bar. ?Keep electrical cords out of the way. ?Do not use floor polish or wax that makes floors slippery. If you must use wax, use non-skid floor wax. ?Do not have throw rugs and other things on the floor that can make you trip. ?What can I do with my stairs? ?Do not leave any items on the stairs. ?Make sure that there are handrails on both sides of the stairs and use them. Fix handrails that are broken or loose. Make sure that handrails are as long as the stairways. ?Check any carpeting to make sure that it is firmly attached to the stairs. Fix any carpet that is loose or worn. ?Avoid having throw rugs at the top or bottom of the stairs. If you do have throw  rugs, attach them to the floor with carpet tape. ?Make sure that you have a light switch at the top of the stairs and the bottom of the stairs. If you do not have them, ask someone to add them for you. ?What else can I do to help prevent falls? ?Wear shoes that: ?Do not have high heels. ?Have rubber bottoms. ?Are comfortable and fit you well. ?Are closed at the toe. Do not wear sandals. ?If you use a stepladder: ?Make sure that it is fully opened. Do not climb a closed stepladder. ?Make sure that both sides of the stepladder are locked into place. ?Ask someone to hold it for you, if possible. ?Clearly mark and make sure that you can see: ?Any grab bars or handrails. ?First and last steps. ?Where the edge of  each step is. ?Use tools that help you move around (mobility aids) if they are needed. These include: ?Canes. ?Walkers. ?Scooters. ?Crutches. ?Turn on the lights when you go into a dark area. Replace any light

## 2021-12-09 ENCOUNTER — Inpatient Hospital Stay: Payer: Medicare PPO | Attending: Nurse Practitioner

## 2021-12-09 ENCOUNTER — Other Ambulatory Visit: Payer: Self-pay

## 2021-12-09 ENCOUNTER — Inpatient Hospital Stay: Payer: Medicare PPO | Admitting: Oncology

## 2021-12-09 ENCOUNTER — Encounter: Payer: Self-pay | Admitting: Oncology

## 2021-12-09 VITALS — BP 140/65 | HR 81 | Temp 96.8°F | Resp 18 | Wt 171.9 lb

## 2021-12-09 DIAGNOSIS — K449 Diaphragmatic hernia without obstruction or gangrene: Secondary | ICD-10-CM | POA: Diagnosis not present

## 2021-12-09 DIAGNOSIS — M2550 Pain in unspecified joint: Secondary | ICD-10-CM | POA: Diagnosis not present

## 2021-12-09 DIAGNOSIS — Z79899 Other long term (current) drug therapy: Secondary | ICD-10-CM | POA: Diagnosis not present

## 2021-12-09 DIAGNOSIS — Z7982 Long term (current) use of aspirin: Secondary | ICD-10-CM | POA: Insufficient documentation

## 2021-12-09 DIAGNOSIS — D0512 Intraductal carcinoma in situ of left breast: Secondary | ICD-10-CM

## 2021-12-09 DIAGNOSIS — Z923 Personal history of irradiation: Secondary | ICD-10-CM | POA: Insufficient documentation

## 2021-12-09 DIAGNOSIS — Z86000 Personal history of in-situ neoplasm of breast: Secondary | ICD-10-CM | POA: Diagnosis not present

## 2021-12-09 DIAGNOSIS — Z79811 Long term (current) use of aromatase inhibitors: Secondary | ICD-10-CM

## 2021-12-09 DIAGNOSIS — Z9049 Acquired absence of other specified parts of digestive tract: Secondary | ICD-10-CM | POA: Diagnosis not present

## 2021-12-09 DIAGNOSIS — M8580 Other specified disorders of bone density and structure, unspecified site: Secondary | ICD-10-CM | POA: Insufficient documentation

## 2021-12-09 DIAGNOSIS — D509 Iron deficiency anemia, unspecified: Secondary | ICD-10-CM | POA: Insufficient documentation

## 2021-12-09 LAB — COMPREHENSIVE METABOLIC PANEL
ALT: 18 U/L (ref 0–44)
AST: 17 U/L (ref 15–41)
Albumin: 4.2 g/dL (ref 3.5–5.0)
Alkaline Phosphatase: 44 U/L (ref 38–126)
Anion gap: 7 (ref 5–15)
BUN: 15 mg/dL (ref 8–23)
CO2: 27 mmol/L (ref 22–32)
Calcium: 9 mg/dL (ref 8.9–10.3)
Chloride: 104 mmol/L (ref 98–111)
Creatinine, Ser: 0.89 mg/dL (ref 0.44–1.00)
GFR, Estimated: >60 mL/min (ref >60)
Glucose, Bld: 99 mg/dL (ref 70–99)
Potassium: 3.9 mmol/L (ref 3.5–5.1)
Sodium: 138 mmol/L (ref 135–145)
Total Bilirubin: 1.2 mg/dL (ref 0.3–1.2)
Total Protein: 7.1 g/dL (ref 6.5–8.1)

## 2021-12-09 LAB — CBC WITH DIFFERENTIAL/PLATELET
Abs Immature Granulocytes: 0.02 10*3/uL (ref 0.00–0.07)
Basophils Absolute: 0.1 10*3/uL (ref 0.0–0.1)
Basophils Relative: 1 %
Eosinophils Absolute: 0.6 10*3/uL — ABNORMAL HIGH (ref 0.0–0.5)
Eosinophils Relative: 6 %
HCT: 40.7 % (ref 36.0–46.0)
Hemoglobin: 13.4 g/dL (ref 12.0–15.0)
Immature Granulocytes: 0 %
Lymphocytes Relative: 22 %
Lymphs Abs: 2.1 10*3/uL (ref 0.7–4.0)
MCH: 30 pg (ref 26.0–34.0)
MCHC: 32.9 g/dL (ref 30.0–36.0)
MCV: 91.1 fL (ref 80.0–100.0)
Monocytes Absolute: 0.8 10*3/uL (ref 0.1–1.0)
Monocytes Relative: 8 %
Neutro Abs: 5.9 10*3/uL (ref 1.7–7.7)
Neutrophils Relative %: 63 %
Platelets: 341 10*3/uL (ref 150–400)
RBC: 4.47 MIL/uL (ref 3.87–5.11)
RDW: 12.4 % (ref 11.5–15.5)
WBC: 9.3 10*3/uL (ref 4.0–10.5)
nRBC: 0 % (ref 0.0–0.2)

## 2021-12-09 MED ORDER — EXEMESTANE 25 MG PO TABS: 25.0000 mg | ORAL_TABLET | Freq: Every day | ORAL | 1 refills | Status: DC

## 2021-12-09 NOTE — Progress Notes (Signed)
Patient here for follow up. Reports pain that radiates to top of left shoulder. No new breast problems  ?

## 2021-12-09 NOTE — Progress Notes (Signed)
?Hematology/Oncology Progress note ?[phone] [phone] ?  ? ? ?Patient Care Team: ?Jerrol Banana., MD as PCP - General (Family Medicine) ?Estill Cotta, MD as Consulting Physician (Ophthalmology) ?Gae Dry, MD as Referring Physician (Obstetrics and Gynecology) ?Earlie Server, MD as Consulting Physician (Oncology) ?Thomes Dinning, MD as Referring Physician (Internal Medicine) ? ?REFERRING PROVIDER: ?Dr.Anna Kiran ?REASON FOR VISIT ?Follow-up for DCIS ? ?HISTORY OF PRESENTING ILLNESS:  ?Claire Kane is a  81 y.o.  female with PMH listed below who was referred to me for evaluation of anemia.  ?Patient reports she was told that she had iron deficiency 2 years ago and she took iron supplementation for a while and was told to stop. Recently found to to have worsening of anemia. She had Upper and lower endoscopy in 2017 by Dr.Sankar ?# Upper endoscopy 12/07/2017 ?Normal examined duodenum. ?- Large hiatal hernia. ?- Erythematous mucosa in the antrum. Biopsied. ?- The examination was otherwise normal ?# Colonoscopy on 12/07/2017  ?Non-thrombosed external hemorrhoids found on perianal exam. ?- Diverticulosis in the sigmoid colon and in the descending colon. ?- The examination was otherwise normal on direct and retroflexion views. ?- No specimens collected. ?#  capsule study which did not reveal any active bleeding source.  ?# Remote history of DCIS right breast in  2002, s/p surgery and RT. Finished 5 years of tamoxifen.  ? ?# April - May s/p upper and lower endoscopy, capsule study which did not reveal active bleeding source ? Celiac panel positive. ? ?# iron deficiency anemia.  Previously she has received IV Venofer. ?Patient has previously underwent extensive gastroenterology work-up including EGD, colonoscopy, capsule study which did not reveal active bleeding source. ? ?#08/18/2020, screening mammogram showed left breast calcification. ?09/07/2020, diagnostic left mammogram showed  an area of 1.4 cm group of calcifications in the upper outer left breast.  Patient underwent breast biopsy, pathology came back positive for high-grade DCIS, focal necrosis, associated with calcifications.  ER status deferred to existing specimen. ? ?# #10/07/2020, status post left DCIS lumpectomy by Dr. Bary Castilla ?Pathology showed residual high-grade DCIS with associated calcifications.  Extent of DCIS at least 12 mm, grade 3, central necrosis, anterior and inferior margins were unifocal in positive for DCIS ?pTis, ER positive. ? ?11/09/2020 reexcision showed no residual DCIS or invasive carcinoma. ?01/04/2021, adjuvant breast radiation ? ?Patient reports family history of VTE daughter who was tested positive for mutation. ?Significant family history of cardiovascular disease, familiar hypercholesterolemia. Parents, sister, brother passed away from heart attack. ? ?01/08/2021, started on Arimidex.  ? ?Interval History ?81 year old female with pertinent hematology history listed above reviewed by me today presents to reestablish care for left breast DCIS.   ?09/01/2021, switched to Aromasin due to arthralgia.  So far she tolerates well. ?Patient reports recently increased acid reflux.  Patient has a history of hiatal hernia. ?She takes omeprazole 20 mg daily. ? ? ? ? ?Review of Systems  ?Constitutional:  Negative for chills, diaphoresis, fever, malaise/fatigue and weight loss.  ?HENT:  Negative for ear discharge, nosebleeds and sore throat.   ?Eyes:  Negative for double vision, photophobia, discharge and redness.  ?Respiratory:  Negative for cough, hemoptysis, sputum production, shortness of breath and wheezing.   ?Cardiovascular:  Negative for chest pain, palpitations, orthopnea, claudication and leg swelling.  ?Gastrointestinal:  Negative for abdominal pain, blood in stool, heartburn, nausea and vomiting.  ?     Acid reflux  ?Genitourinary:  Negative for dysuria and urgency.  ?Musculoskeletal:  Positive  for joint pain.  Negative for back pain and neck pain.  ?Skin:  Negative for itching and rash.  ?Neurological:  Negative for dizziness, tingling, tremors and sensory change.  ?Endo/Heme/Allergies:  Negative for environmental allergies. Does not bruise/bleed easily.  ?Psychiatric/Behavioral:  Negative for depression, hallucinations, substance abuse and suicidal ideas.   ? ?MEDICAL HISTORY:  ?Past Medical History:  ?Diagnosis Date  ? Anemia   ? fe deficiency now improved at age 7  ? Anxiety   ? Arthritis   ? BRCA negative 08/04/2015  ? BRCA1 and BRCA2  ? Breast cancer ([place]) 2002  ? DCIS right breast   ? Breast cancer ([place]) 09/2020  ? left breast  ? Cataract   ? GERD (gastroesophageal reflux disease)   ? Heart murmur 2001  ? Hiatal hernia   ? High cholesterol   ? Hypertension   ? patient denies anyone telling her that she has htn  ? Personal history of radiation therapy 2002, 2022  ? BREAST CA  ? Rosacea   ? 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 complications   ? ? ?SURGICAL HISTORY: ?Past Surgical History:  ?Procedure Laterality Date  ? ABDOMINAL HYSTERECTOMY    ? BLEPHAROPLASTY    ? BREAST BIOPSY Left 09/15/2020  ? affirm bx, x marker, DCIS  ? BREAST CYST ASPIRATION Left 2003  ? BREAST EXCISIONAL BIOPSY Right 2002  ? POS  ? BREAST LUMPECTOMY Right 2002  ? w/ radiation  ? BREAST LUMPECTOMY Left 10/07/2020  ? 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  ? BREAST LUMPECTOMY Left   ? re-excision  ? BREAST LUMPECTOMY WITH NEEDLE LOCALIZATION Left 10/07/2020  ? Procedure: BREAST LUMPECTOMY WITH NEEDLE LOCALIZATION;  Surgeon: Robert Bellow, MD;  Location: [facility] ORS;  Service: General;  Laterality: Left;  ? BREAST SURGERY Right 2002  ? lumpectomy  ? CATARACT EXTRACTION W/ INTRAOCULAR LENS  IMPLANT, BILATERAL    ? CHOLECYSTECTOMY    ? COLONOSCOPY  2013  ? COLONOSCOPY WITH PROPOFOL N/A 12/08/2015  ? Procedure: COLONOSCOPY WITH PROPOFOL;  Surgeon: Christene Lye, MD;  Location: [facility] ENDOSCOPY;  Service: Endoscopy;  Laterality: N/A;  ? COLONOSCOPY WITH PROPOFOL N/A 12/06/2017  ? Procedure: COLONOSCOPY WITH PROPOFOL;  Surgeon: Jonathon Bellows, MD;  Location: [facility] ENDOSCOPY;  Service: Gastroenterology;  Laterality: N/A;  ? COLONOSCOPY WITH PROPOFOL N/A 12/22/2017  ? Procedure: COLONOSCOPY WITH PROPOFOL;  Surgeon: Jonathon Bellows, MD;  Location: [facility] ENDOSCOPY;  Service: Gastroenterology;  Laterality: N/A;  ? ESOPHAGOGASTRODUODENOSCOPY (EGD) WITH PROPOFOL N/A 12/08/2015  ? Procedure: ESOPHAGOGASTRODUODENOSCOPY (EGD) WITH PROPOFOL;  Surgeon: Christene Lye, MD;  Location: [facility] ENDOSCOPY;  Service: Endoscopy;  Laterality: N/A;  ? ESOPHAGOGASTRODUODENOSCOPY (EGD) WITH PROPOFOL N/A 12/06/2017  ? Procedure: ESOPHAGOGASTRODUODENOSCOPY (EGD) WITH PROPOFOL;  Surgeon: Jonathon Bellows, MD;  Location: [facility] ENDOSCOPY;  Service: Gastroenterology;  Laterality: N/A;  ? ESOPHAGOGASTRODUODENOSCOPY (EGD) WITH PROPOFOL N/A 12/22/2017  ? Procedure: ESOPHAGOGASTRODUODENOSCOPY (EGD) WITH PROPOFOL;  Surgeon: Jonathon Bellows, MD;  Location: [facility] ENDOSCOPY;  Service: Gastroenterology;  Laterality: N/A;  ? EYE SURGERY Right 2014  ? EYE SURGERY Left 2014  ? GIVENS CAPSULE STUDY N/A 01/23/2018  ? Procedure: GIVENS CAPSULE STUDY;  Surgeon: Jonathon Bellows, MD;  Location: [facility] ENDOSCOPY;  Service: Gastroenterology;  Laterality: N/A;  ? iron infusion    ? had 5 iron infusions for anemia  ? post radiation therapy Right   ? right breast cancer 2002  ? RE-EXCISION OF BREAST CANCER,SUPERIOR MARGINS Left  11/09/2020  ? Procedure: RE-EXCISION OF BREAST CANCER,SUPERIOR MARGINS;  Surgeon: Robert Bellow, MD;  Location: [facility] ORS;  Service: General;  Laterality: Left;  ? Stab pheblectomy   2010  ? vein closure procedure Bilateral 2008  ? ? ?SOCIAL HISTORY: ?Social History  ? ?Socioeconomic History  ? Marital status: Married  ?  Spouse name: Fritz Pickerel  ? Number of children: 2  ? Years of education: Not on file  ? Highest  education level: Bachelor's degree (e.g., BA, AB, BS)  ?Occupational History  ? Occupation: Pharmacist, hospital  ?  Comment: retired  ?Tobacco Use  ? Smoking status: Never  ? Smokeless tobacco: Never  ?Vaping Use  ? Vapin

## 2021-12-25 ENCOUNTER — Other Ambulatory Visit: Payer: Self-pay | Admitting: Family Medicine

## 2021-12-25 DIAGNOSIS — I10 Essential (primary) hypertension: Secondary | ICD-10-CM

## 2021-12-27 NOTE — Telephone Encounter (Signed)
Requested Prescriptions  ?Pending Prescriptions Disp Refills  ?? ezetimibe (ZETIA) 10 MG tablet [Pharmacy Med Name: EZETIMIBE 10 MG Tablet] 90 tablet 1  ?  Sig: TAKE 1 TABLET EVERY DAY  ?  ? Cardiovascular:  Antilipid - Sterol Transport Inhibitors Failed - 12/25/2021  2:58 AM  ?  ?  Failed - Lipid Panel in normal range within the last 12 months  ?  Cholesterol, Total  ?Date Value Ref Range Status  ?03/30/2021 123 100 - 199 mg/dL Final  ? ?LDL Chol Calc (NIH)  ?Date Value Ref Range Status  ?03/30/2021 60 0 - 99 mg/dL Final  ? ?HDL  ?Date Value Ref Range Status  ?03/30/2021 47 >39 mg/dL Final  ? ?Triglycerides  ?Date Value Ref Range Status  ?03/30/2021 84 0 - 149 mg/dL Final  ? ?  ?  ?  Passed - AST in normal range and within 360 days  ?  AST  ?Date Value Ref Range Status  ?12/09/2021 17 15 - 41 U/L Final  ?   ?  ?  Passed - ALT in normal range and within 360 days  ?  ALT  ?Date Value Ref Range Status  ?12/09/2021 18 0 - 44 U/L Final  ?   ?  ?  Passed - Patient is not pregnant  ?  ?  Passed - Valid encounter within last 12 months  ?  Recent Outpatient Visits   ?      ? 4 months ago Essential hypertension  ? [facility] Jerrol Banana., MD  ? 5 months ago Tennis Must Quervain's disease (tenosynovitis)  ? [facility] Thedore Mins, Ria Comment, PA-C  ? 8 months ago Essential hypertension  ? [facility] Jerrol Banana., MD  ? 12 months ago Essential hypertension  ? [facility] Jerrol Banana., MD  ? 1 year ago Encounter for annual wellness visit (AWV) in Medicare patient  ? [facility] Jerrol Banana., MD  ?  ?  ?Future Appointments   ?        ? In 1 month Jerrol Banana., MD [facility]  ?  ? ?  ?  ?  ?? amLODipine (NORVASC) 5 MG tablet [Pharmacy Med Name: AMLODIPINE BESYLATE 5 MG Tablet] 90 tablet 1  ?  Sig: TAKE 1 TABLET EVERY DAY  ?  ? Cardiovascular: Calcium Channel Blockers 2 Failed - 12/25/2021  2:58 AM  ?  ?   Failed - Last BP in normal range  ?  BP Readings from Last 1 Encounters:  ?12/09/21 140/65  ?   ?  ?  Passed - Last Heart Rate in normal range  ?  Pulse Readings from Last 1 Encounters:  ?12/09/21 81  ?   ?  ?  Passed - Valid encounter within last 6 months  ?  Recent Outpatient Visits   ?      ? 4 months ago Essential hypertension  ? [facility] Jerrol Banana., MD  ? 5 months ago Tennis Must Quervain's disease (tenosynovitis)  ? [facility] Thedore Mins, Ria Comment, PA-C  ? 8 months ago Essential hypertension  ? [facility] Jerrol Banana., MD  ? 12 months ago Essential hypertension  ? [facility] Jerrol Banana., MD  ? 1 year ago Encounter for annual wellness visit (AWV) in Medicare patient  ? [facility] Jerrol Banana., MD  ?  ?  ?  Future Appointments   ?        ? In 1 month Jerrol Banana., MD [facility]  ?  ? ?  ?  ?  ? ?

## 2022-01-07 ENCOUNTER — Ambulatory Visit
Admission: RE | Admit: 2022-01-07 | Discharge: 2022-01-07 | Disposition: A | Discharge status: Home / Self Care | Payer: Medicare PPO | Source: Ambulatory Visit | Attending: Radiation Oncology | Admitting: Radiation Oncology

## 2022-01-07 ENCOUNTER — Telehealth: Payer: Self-pay

## 2022-01-07 ENCOUNTER — Encounter: Payer: Self-pay | Admitting: Radiation Oncology

## 2022-01-07 VITALS — BP 154/73 | HR 78 | Temp 97.0°F | Resp 18 | Ht 62.5 in | Wt 169.5 lb

## 2022-01-07 DIAGNOSIS — Z170 Estrogen receptor positive status [ER+]: Secondary | ICD-10-CM | POA: Insufficient documentation

## 2022-01-07 DIAGNOSIS — Z08 Encounter for follow-up examination after completed treatment for malignant neoplasm: Secondary | ICD-10-CM | POA: Diagnosis not present

## 2022-01-07 DIAGNOSIS — Z923 Personal history of irradiation: Secondary | ICD-10-CM | POA: Insufficient documentation

## 2022-01-07 DIAGNOSIS — K449 Diaphragmatic hernia without obstruction or gangrene: Secondary | ICD-10-CM | POA: Insufficient documentation

## 2022-01-07 DIAGNOSIS — D0512 Intraductal carcinoma in situ of left breast: Secondary | ICD-10-CM | POA: Diagnosis not present

## 2022-01-07 DIAGNOSIS — K219 Gastro-esophageal reflux disease without esophagitis: Secondary | ICD-10-CM | POA: Diagnosis not present

## 2022-01-07 NOTE — Telephone Encounter (Signed)
Copied from [place]. Topic: Referral - Request for Referral ?>> Jan 07, 2022  2:43 PM Alanda Slim E wrote: ?Has patient seen PCP for this complaint? Yes  ?*If NO, is insurance requiring patient see PCP for this issue before PCP can refer them? ?Referral for which specialty: Gertie Fey  ?Preferred provider/office:  ?Reason for referral: acid reflux ?

## 2022-01-07 NOTE — Progress Notes (Signed)
Radiation Oncology ?Follow up Note ? ?Name: Claire Kane   ?Date:   01/07/2022 ?MRN:  [id number] ?DOB: 1941/07/02  ? ? ?This 81 y.o. female presents to the clinic today for 1 year follow-up status post whole breast radiation to her left breast for ER positive DCIS. ? ?REFERRING PROVIDER: Jerrol Banana.,* ? ?HPI: Patient is a 81 year old female now at 1 year having completed whole breast radiation to her left breast for ER positive DCIS.  From a breast endpoint she is doing well.  She specifically denies breast tenderness cough or bone pain.  She is having having some problems with acid reflux and I have asked her to contact Dr. Rosanna Randy for recommendation for GI work-up.  She is currently on proton pump inhibitors although continues to have reflux.  She does have a history of hiatal hernia.  She is also currently on.  Aromasin tolerating that well.  She had mammograms back in December which I have reviewed were BI-RADS 2 benign. ? ?COMPLICATIONS OF TREATMENT: none ? ?FOLLOW UP COMPLIANCE: keeps appointments  ? ?PHYSICAL EXAM:  ?BP (!) 154/73   Pulse 78   Temp (!) 97 ?F (36.1 ?C)   Resp 18   Ht 5' 2.5" (1.588 m)   Wt 169 lb 8 oz (76.9 kg)   BMI 30.51 kg/m?  ?Lungs are clear to A&P cardiac examination essentially unremarkable with regular rate and rhythm. No dominant mass or nodularity is noted in either breast in 2 positions examined. Incision is well-healed. No axillary or supraclavicular adenopathy is appreciated. Cosmetic result is excellent.  Well-developed well-nourished patient in NAD. HEENT reveals PERLA, EOMI, discs not visualized.  Oral cavity is clear. No oral mucosal lesions are identified. Neck is clear without evidence of cervical or supraclavicular adenopathy. Lungs are clear to A&P. Cardiac examination is essentially unremarkable with regular rate and rhythm without murmur rub or thrill. Abdomen is benign with no organomegaly or masses noted. Motor sensory and DTR levels are equal and  symmetric in the upper and lower extremities. Cranial nerves II through XII are grossly intact. Proprioception is intact. No peripheral adenopathy or edema is identified. No motor or sensory levels are noted. Crude visual fields are within normal range. ? ?RADIOLOGY RESULTS: Mammograms reviewed compatible with above-stated findings ? ?PLAN: Present time patient is doing well with no evidence of disease 1 year out.  And pleased with her overall progress from a breast standpoint of asked to see her back in 1 year for follow-up.  She will be scheduling a GI consultation.  Patient knows to call with any concerns. ? ?I would like to take this opportunity to thank you for allowing me to participate in the care of your patient.. ?  ? Noreene Filbert, MD ? ?

## 2022-01-10 NOTE — Telephone Encounter (Signed)
Please advise referral?  

## 2022-01-12 NOTE — Progress Notes (Signed)
?  ? ?I,Joseline E Rosas,acting as a scribe for Wilhemena Durie, MD.,have documented all relevant documentation on the behalf of Wilhemena Durie, MD,as directed by  Wilhemena Durie, MD while in the presence of Wilhemena Durie, MD.  ? ?Established patient visit ? ? ?Patient: Claire Kane   DOB: [birth year]   81 y.o. Female  MRN: [id number] ?Visit Date: 01/13/2022 ? ?Today's healthcare provider: Wilhemena Durie, MD  ? ?Chief Complaint  ?Patient presents with  ? Referral to GI  ? ?Subjective  ?  ?HPI  ?Patient is an 81 year old female who presents for evaluation of acid reflex and possible referral to gastroenterology.  Other than her GI symptoms she feels well. ?GERD: Paitent complains of heartburn for the past month and when it happens it radiates to her left ar,. This has been associated with abdominal bloating, heartburn, hoarseness, midespigastric pain, upper abdominal discomfort, and radiates to her back . Reports that she has been taking Omeprazole once a day for years but She was seen by Dr. Tasia Catchings and was advised to take Omeprazole BID and she has been doing this since March. ? ?Medications: ?Outpatient Medications Prior to Visit  ?Medication Sig  ? amLODipine (NORVASC) 5 MG tablet TAKE 1 TABLET EVERY DAY  ? atorvastatin (LIPITOR) 10 MG tablet TAKE 1 TABLET EVERY DAY  ? calcium citrate (CALCITRATE - DOSED IN MG ELEMENTAL CALCIUM) 950 MG tablet Take 200 mg of elemental calcium by mouth 2 (two) times daily.   ? docusate sodium (COLACE) 100 MG capsule Take 100 mg by mouth daily as needed for mild constipation.  ? doxycycline (ORACEA) 40 MG capsule Take by mouth.  ? doxycycline (PERIOSTAT) 20 MG tablet doxycycline hyclate 20 mg tablet ? TAKE 1 TABLET BY MOUTH TWICE DAILY  ? Eflornithine HCl (VANIQA) 13.9 % cream Vaniqa 13.9 % topical cream ? APPLY TO UNWANTED HAIR TWICE DAILY  ? exemestane (AROMASIN) 25 MG tablet Take 1 tablet (25 mg total) by mouth daily after breakfast.  ? ezetimibe (ZETIA) 10 MG  tablet TAKE 1 TABLET EVERY DAY  ? Glucosamine 750 MG TABS Take 750 mg by mouth daily.  ? Glycerin-Polysorbate 80 (REFRESH DRY EYE THERAPY OP) Place 1 drop into both eyes 2 (two) times daily.  ? Multiple Vitamins-Minerals (MULTIVITAMIN WITH MINERALS) tablet Take 1 tablet by mouth daily.  ? psyllium (REGULOID) 0.52 G capsule Take 2 capsules by mouth daily.  ? solifenacin (VESICARE) 10 MG tablet Take 1 tablet (10 mg total) by mouth daily.  ? SOOLANTRA 1 % CREA Apply 1 application topically daily as needed (rosacea).  ? spironolactone (ALDACTONE) 50 MG tablet Take 50 mg by mouth daily.  ? traMADol (ULTRAM) 50 MG tablet TAKE 1 TABLET (50 MG TOTAL) BY MOUTH EVERY 6 (SIX) HOURS AS NEEDED FOR SEVERE PAIN.  ? [DISCONTINUED] omeprazole (PRILOSEC) 20 MG capsule Take 1 capsule (20 mg total) by mouth daily.  ? ?Facility-Administered Medications Prior to Visit  ?Medication Dose Route Frequency Provider  ? 0.9 %  sodium chloride infusion   Intravenous Once Earlie Server, MD  ? ? ?Review of Systems  ?Constitutional:  Negative for appetite change, chills, fatigue and fever.  ?Respiratory:  Negative for chest tightness and shortness of breath.   ?Cardiovascular:  Negative for chest pain and palpitations.  ?Gastrointestinal:  Positive for abdominal distention. Negative for abdominal pain, nausea and vomiting.  ?Neurological:  Negative for dizziness and weakness.  ? ?Last metabolic panel ?Lab Results  ?Component Value Date  ?  GLUCOSE 99 12/09/2021  ? NA 138 12/09/2021  ? K 3.9 12/09/2021  ? CL 104 12/09/2021  ? CO2 27 12/09/2021  ? BUN 15 12/09/2021  ? CREATININE 0.89 12/09/2021  ? GFRNONAA >60 12/09/2021  ? CALCIUM 9.0 12/09/2021  ? PHOS 3.9 12/13/2016  ? PROT 7.1 12/09/2021  ? ALBUMIN 4.2 12/09/2021  ? LABGLOB 2.1 03/30/2021  ? AGRATIO 2.3 (H) 03/30/2021  ? BILITOT 1.2 12/09/2021  ? ALKPHOS 44 12/09/2021  ? AST 17 12/09/2021  ? ALT 18 12/09/2021  ? ANIONGAP 7 12/09/2021  ? ?  ?  Objective  ?  ?BP 137/72 (BP Location: Right Arm, Patient  Position: Sitting, Cuff Size: Normal)   Pulse 80   Temp 97.9 ?F (36.6 ?C) (Oral)   Resp 16   Wt 171 lb 4.8 oz (77.7 kg)   BMI 30.83 kg/m?  ?BP Readings from Last 3 Encounters:  ?01/13/22 137/72  ?01/07/22 (!) 154/73  ?12/09/21 140/65  ? ?Wt Readings from Last 3 Encounters:  ?01/13/22 171 lb 4.8 oz (77.7 kg)  ?01/07/22 169 lb 8 oz (76.9 kg)  ?12/09/21 171 lb 14.4 oz (78 kg)  ? ?  ? ?Physical Exam ?Vitals reviewed.  ?Constitutional:   ?   General: She is not in acute distress. ?   Appearance: She is well-developed.  ?HENT:  ?   Head: Normocephalic and atraumatic.  ?   Right Ear: Hearing normal.  ?   Left Ear: Hearing normal.  ?   Nose: Nose normal.  ?Eyes:  ?   General: Lids are normal. No scleral icterus.    ?   Right eye: No discharge.     ?   Left eye: No discharge.  ?   Conjunctiva/sclera: Conjunctivae normal.  ?Cardiovascular:  ?   Rate and Rhythm: Normal rate and regular rhythm.  ?   Heart sounds: Normal heart sounds.  ?Pulmonary:  ?   Effort: Pulmonary effort is normal. No respiratory distress.  ?Abdominal:  ?   Palpations: Abdomen is soft.  ?Skin: ?   General: Skin is warm and dry.  ?   Findings: No lesion or rash.  ?Neurological:  ?   General: No focal deficit present.  ?   Mental Status: She is alert and oriented to person, place, and time.  ?Psychiatric:     ?   Mood and Affect: Mood normal.     ?   Speech: Speech normal.     ?   Behavior: Behavior normal.     ?   Thought Content: Thought content normal.     ?   Judgment: Judgment normal.  ?  ? ? ?No results found for any visits on 01/13/22. ? Assessment & Plan  ?  ? ?1. Gastroesophageal reflux disease, unspecified whether esophagitis present ?Change omeprazole to pantoprazole and add Carafate before meals and at bedtime. ?Go ahead and refer to GI. ?- Ambulatory referral to Gastroenterology ? ?2. Essential hypertension ?Good control. ? ?3. Iron deficiency anemia, unspecified iron deficiency anemia type ?History of iron deficiency anemia. ? ?4. Class 1  obesity due to excess calories with serious comorbidity and body mass index (BMI) of 32.0 to 32.9 in adult ? ? ?5. History of breast cancer, DCIS right breast, 2002 ? ? ? ?No follow-ups on file.  ?   ? ?I, Wilhemena Durie, MD, have reviewed all documentation for this visit. The documentation on 01/18/22 for the exam, diagnosis, procedures, and orders are all accurate and complete. ? ? ? ?  Tajana Crotteau Cranford Mon, MD  ?[facility] ?[phone] (phone) ?[phone] (fax) ? ?[facility] ?

## 2022-01-13 ENCOUNTER — Ambulatory Visit: Payer: Medicare PPO | Admitting: Family Medicine

## 2022-01-13 ENCOUNTER — Encounter: Payer: Self-pay | Admitting: Family Medicine

## 2022-01-13 VITALS — BP 137/72 | HR 80 | Temp 97.9°F | Resp 16 | Wt 171.3 lb

## 2022-01-13 DIAGNOSIS — I10 Essential (primary) hypertension: Secondary | ICD-10-CM

## 2022-01-13 DIAGNOSIS — Z853 Personal history of malignant neoplasm of breast: Secondary | ICD-10-CM | POA: Diagnosis not present

## 2022-01-13 DIAGNOSIS — D509 Iron deficiency anemia, unspecified: Secondary | ICD-10-CM

## 2022-01-13 DIAGNOSIS — Z6832 Body mass index (BMI) 32.0-32.9, adult: Secondary | ICD-10-CM

## 2022-01-13 DIAGNOSIS — E6609 Other obesity due to excess calories: Secondary | ICD-10-CM | POA: Diagnosis not present

## 2022-01-13 DIAGNOSIS — K219 Gastro-esophageal reflux disease without esophagitis: Secondary | ICD-10-CM

## 2022-01-13 MED ORDER — PANTOPRAZOLE SODIUM 40 MG PO TBEC: 40.0000 mg | DELAYED_RELEASE_TABLET | Freq: Every day | ORAL | 3 refills | Status: DC

## 2022-01-13 MED ORDER — SUCRALFATE 1 G PO TABS: 1.0000 g | ORAL_TABLET | Freq: Three times a day (TID) | ORAL | 11 refills | Status: DC

## 2022-01-13 NOTE — Patient Instructions (Addendum)
Stop Omeprazole. Start Pantoprazole. ?

## 2022-01-18 ENCOUNTER — Encounter: Payer: Self-pay | Admitting: Family Medicine

## 2022-02-08 ENCOUNTER — Ambulatory Visit: Payer: Medicare PPO | Admitting: Family Medicine

## 2022-03-08 ENCOUNTER — Encounter: Payer: Self-pay | Admitting: Gastroenterology

## 2022-03-08 ENCOUNTER — Ambulatory Visit: Payer: Medicare PPO | Admitting: Gastroenterology

## 2022-03-08 VITALS — BP 168/77 | HR 80 | Temp 98.7°F | Wt 169.0 lb

## 2022-03-08 DIAGNOSIS — K449 Diaphragmatic hernia without obstruction or gangrene: Secondary | ICD-10-CM | POA: Diagnosis not present

## 2022-03-08 DIAGNOSIS — K219 Gastro-esophageal reflux disease without esophagitis: Secondary | ICD-10-CM

## 2022-03-08 DIAGNOSIS — R140 Abdominal distension (gaseous): Secondary | ICD-10-CM

## 2022-03-08 MED ORDER — PANTOPRAZOLE SODIUM 40 MG PO TBEC: 40.0000 mg | DELAYED_RELEASE_TABLET | Freq: Every day | ORAL | 3 refills | Status: DC

## 2022-03-08 MED ORDER — PANTOPRAZOLE SODIUM 40 MG PO TBEC
40.0000 mg | DELAYED_RELEASE_TABLET | Freq: Two times a day (BID) | ORAL | 3 refills | Status: DC
Start: 2022-03-08 — End: 2022-05-16

## 2022-03-08 NOTE — Progress Notes (Signed)
Jonathon Bellows MD, MRCP(U.K) [address]  [place]  [place], [place] [address]  Main: [phone]  Fax: [phone]   Gastroenterology Consultation  Referring Provider:     Jerrol Banana.,* Primary Care Physician:  Jerrol Banana., MD Primary Gastroenterologist:  Dr. Jonathon Bellows  Reason for Consultation:     GERD, abdominal bloating        HPI:   Claire Kane is a 81 y.o. y/o female referred for consultation & management  by Dr. Rosanna Randy, Retia Passe., MD.      She is a patient that have last seen in June 2019 for iron deficiency anemia.  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 Tasia Catchings for IV iron.  She has been referred to see me for reflux, slight increase in symptoms recently she is on Protonix 40 mg once a day.  She has been having some bloating gaseous distention she consumes soda daily.  No difficulty swallowing no change in bowel habits.  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   Breast cancer ([place]) 2002   DCIS right breast    Breast cancer ([place]) 09/2020   left breast   Cataract    GERD (gastroesophageal reflux disease)    Heart murmur 2001   Hiatal hernia    High cholesterol    Hypertension    patient denies anyone telling her that she has htn   Personal history of radiation therapy 2002, 2022   BREAST CA   Rosacea    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 complications     Past Surgical History:  Procedure Laterality Date   ABDOMINAL HYSTERECTOMY     BLEPHAROPLASTY     BREAST BIOPSY Left 09/15/2020   affirm bx, x marker, DCIS   BREAST CYST ASPIRATION Left 2003   BREAST EXCISIONAL BIOPSY Right 2002   POS   BREAST LUMPECTOMY Right 2002   w/ radiation   BREAST LUMPECTOMY Left 10/07/2020   RESIDUAL HIGH-GRADE DUCTAL CARCINOMA IN  SITU (DCIS) DCIS is focally present 1.4 mm to the superior margin.   BREAST LUMPECTOMY Left    re-excision   BREAST LUMPECTOMY WITH NEEDLE LOCALIZATION Left 10/07/2020   Procedure: BREAST LUMPECTOMY WITH NEEDLE LOCALIZATION;  Surgeon: Robert Bellow, MD;  Location: [facility] ORS;  Service: General;  Laterality: Left;   BREAST SURGERY Right 2002   lumpectomy   CATARACT EXTRACTION W/ INTRAOCULAR LENS  IMPLANT, BILATERAL     CHOLECYSTECTOMY     COLONOSCOPY  2013   COLONOSCOPY WITH PROPOFOL N/A 12/08/2015   Procedure: COLONOSCOPY WITH PROPOFOL;  Surgeon: Christene Lye, MD;  Location: [facility] ENDOSCOPY;  Service: Endoscopy;  Laterality: N/A;   COLONOSCOPY WITH PROPOFOL N/A 12/06/2017   Procedure: COLONOSCOPY WITH PROPOFOL;  Surgeon: Jonathon Bellows, MD;  Location: [facility] ENDOSCOPY;  Service: Gastroenterology;  Laterality: N/A;   COLONOSCOPY WITH PROPOFOL N/A 12/22/2017   Procedure: COLONOSCOPY WITH PROPOFOL;  Surgeon: Jonathon Bellows, MD;  Location: [facility] ENDOSCOPY;  Service: Gastroenterology;  Laterality: N/A;   ESOPHAGOGASTRODUODENOSCOPY (EGD) WITH PROPOFOL N/A 12/08/2015   Procedure: ESOPHAGOGASTRODUODENOSCOPY (EGD) WITH PROPOFOL;  Surgeon: Christene Lye, MD;  Location: [facility] ENDOSCOPY;  Service: Endoscopy;  Laterality: N/A;   ESOPHAGOGASTRODUODENOSCOPY (EGD) WITH PROPOFOL N/A 12/06/2017  Procedure: ESOPHAGOGASTRODUODENOSCOPY (EGD) WITH PROPOFOL;  Surgeon: Jonathon Bellows, MD;  Location: [facility] ENDOSCOPY;  Service: Gastroenterology;  Laterality: N/A;   ESOPHAGOGASTRODUODENOSCOPY (EGD) WITH PROPOFOL N/A 12/22/2017   Procedure: ESOPHAGOGASTRODUODENOSCOPY (EGD) WITH PROPOFOL;  Surgeon: Jonathon Bellows, MD;  Location: [facility] ENDOSCOPY;  Service: Gastroenterology;  Laterality: N/A;   EYE SURGERY Right 2014   EYE SURGERY Left 2014   GIVENS CAPSULE STUDY N/A 01/23/2018   Procedure: GIVENS CAPSULE STUDY;  Surgeon: Jonathon Bellows, MD;  Location: [facility] ENDOSCOPY;  Service: Gastroenterology;  Laterality: N/A;   iron  infusion     had 5 iron infusions for anemia   post radiation therapy Right    right breast cancer 2002   RE-EXCISION OF BREAST CANCER,SUPERIOR MARGINS Left 11/09/2020   Procedure: RE-EXCISION OF BREAST CANCER,SUPERIOR MARGINS;  Surgeon: Robert Bellow, MD;  Location: [facility] ORS;  Service: General;  Laterality: Left;   Stab pheblectomy   2010   vein closure procedure Bilateral 2008    Prior to Admission medications   Medication Sig Start Date End Date Taking? Authorizing Provider  amLODipine (NORVASC) 5 MG tablet TAKE 1 TABLET EVERY DAY 12/27/21   Jerrol Banana., MD  atorvastatin (LIPITOR) 10 MG tablet TAKE 1 TABLET EVERY DAY 08/01/21   Jerrol Banana., MD  calcium citrate (CALCITRATE - DOSED IN MG ELEMENTAL CALCIUM) 950 MG tablet Take 200 mg of elemental calcium by mouth 2 (two) times daily.     [provider]  docusate sodium (COLACE) 100 MG capsule Take 100 mg by mouth daily as needed for mild constipation.    [provider]  doxycycline (ORACEA) 40 MG capsule Take by mouth. 11/26/08   [provider]  doxycycline (PERIOSTAT) 20 MG tablet doxycycline hyclate 20 mg tablet  TAKE 1 TABLET BY MOUTH TWICE DAILY    [provider]  Eflornithine HCl (VANIQA) 13.9 % cream Vaniqa 13.9 % topical cream  APPLY TO UNWANTED HAIR TWICE DAILY    [provider]  exemestane (AROMASIN) 25 MG tablet Take 1 tablet (25 mg total) by mouth daily after breakfast. 12/09/21   Earlie Server, MD  ezetimibe (ZETIA) 10 MG tablet TAKE 1 TABLET EVERY DAY 12/27/21   Jerrol Banana., MD  Glucosamine 750 MG TABS Take 750 mg by mouth daily.    [provider]  Glycerin-Polysorbate 80 (REFRESH DRY EYE THERAPY OP) Place 1 drop into both eyes 2 (two) times daily.    [provider]  Multiple Vitamins-Minerals (MULTIVITAMIN WITH MINERALS) tablet Take 1 tablet by mouth daily.    [provider]  pantoprazole (PROTONIX) 40 MG tablet Take 1  tablet (40 mg total) by mouth daily. 01/13/22   Jerrol Banana., MD  psyllium (REGULOID) 0.52 G capsule Take 2 capsules by mouth daily.    [provider]  solifenacin (VESICARE) 10 MG tablet Take 1 tablet (10 mg total) by mouth daily. 10/04/21   Gae Dry, MD  SOOLANTRA 1 % CREA Apply 1 application topically daily as needed (rosacea). 06/30/16   [provider]  spironolactone (ALDACTONE) 50 MG tablet Take 50 mg by mouth daily. 12/29/14   [provider]  sucralfate (CARAFATE) 1 g tablet Take 1 tablet (1 g total) by mouth 4 (four) times daily -  with meals and at bedtime. 01/13/22   Jerrol Banana., MD  traMADol (ULTRAM) 50 MG tablet TAKE 1 TABLET (50 MG TOTAL) BY MOUTH EVERY 6 (SIX) HOURS AS NEEDED FOR SEVERE  PAIN. 11/28/21 11/28/22  Jerrol Banana., MD    Family History  Problem Relation Age of Onset   Heart attack Mother    Heart attack Father    Heart attack Sister    Stroke Sister    Heart attack Brother    Other Brother        cerebral hemorrhage, sepsis   Breast cancer Cousin 72       breast/maternal   Cancer Cousin 47       breast/maternal   Breast cancer Cousin    Breast cancer Maternal Aunt 70   Breast cancer Other 48       maternal neice     Social History   Tobacco Use   Smoking status: Never   Smokeless tobacco: Never  Vaping Use   Vaping Use: Never used  Substance Use Topics   Alcohol use: No   Drug use: No    Allergies as of 03/08/2022 - Review Complete 03/08/2022  Allergen Reaction Noted   Zometa [zoledronic acid]  12/06/2021    Review of Systems:    All systems reviewed and negative except where noted in HPI.   Physical Exam:  BP (!) 168/77   Pulse 80   Temp 98.7 F (37.1 C) (Oral)   Wt 169 lb (76.7 kg)   BMI 30.42 kg/m  No LMP recorded. Patient has had a hysterectomy. Psych:  Alert and cooperative. Normal mood and affect. General:   Alert,  Well-developed, well-nourished, pleasant and  cooperative in NAD Head:  Normocephalic and atraumatic. Eyes:  Sclera clear, no icterus.   Conjunctiva pink. Neurologic:  Alert and oriented x3;  grossly normal neurologically. Psych:  Alert and cooperative. Normal mood and affect.  Imaging Studies: No results found.  Assessment and Plan:   LAURIN PAULO is a 81 y.o. y/o female has been referred for GERD.  The EGD that I performed in 2019 demonstrated an 8 cm hiatal hernia which is very likely the underlying cause for the significant reflux which is compounded by the obesity.  Treatment would include maximizing the dose of PPI to 40 mg twice daily of Protonix, maximizing lifestyle changes which includes head elevation, change in meal sizes portion, losing weight.  At her age performing surgery and hiatal hernia would need to consider the risk-benefit ratio which we would like to probably avoid at this point.  She also has bloating for which I suggested she stop consuming soda, try low FODMAP diet.  If no better next visit may need to consider imaging and upper endoscopy   Follow up in 8 to 12 weeks  Dr Jonathon Bellows MD,MRCP(U.K)

## 2022-03-08 NOTE — Patient Instructions (Signed)
Please stop drinking soda pops of any kind.  Low-FODMAP Eating Plan  FODMAP stands for fermentable oligosaccharides, disaccharides, monosaccharides, and polyols. These are sugars that are hard for some people to digest. A low-FODMAP eating plan may help some people who have irritable bowel syndrome (IBS) and certain other bowel (intestinal) diseases to manage their symptoms. This meal plan can be complicated to follow. Work with a diet and nutrition specialist (dietitian) to make a low-FODMAP eating plan that is right for you. A dietitian can help make sure that you get enough nutrition from this diet. What are tips for following this plan? Reading food labels Check labels for hidden FODMAPs such as: High-fructose syrup. Honey. Agave. Natural fruit flavors. Onion or garlic powder. Choose low-FODMAP foods that contain 3-4 grams of fiber per serving. Check food labels for serving sizes. Eat only one serving at a time to make sure FODMAP levels stay low. Shopping Shop with a list of foods that are recommended on this diet and make a meal plan. Meal planning Follow a low-FODMAP eating plan for up to 6 weeks, or as told by your health care provider or dietitian. To follow the eating plan: Eliminate high-FODMAP foods from your diet completely. Choose only low-FODMAP foods to eat. You will do this for 2-6 weeks. Gradually reintroduce high-FODMAP foods into your diet one at a time. Most people should wait a few days before introducing the next new high-FODMAP food into their meal plan. Your dietitian can recommend how quickly you may reintroduce foods. Keep a daily record of what and how much you eat and drink. Make note of any symptoms that you have after eating. Review your daily record with a dietitian regularly to identify which foods you can eat and which foods you should avoid. General tips Drink enough fluid each day to keep your urine pale yellow. Avoid processed foods. These often have  added sugar and may be high in FODMAPs. Avoid most dairy products, whole grains, and sweeteners. Work with a dietitian to make sure you get enough fiber in your diet. Avoid high FODMAP foods at meals to manage symptoms. Recommended foods Fruits Bananas, oranges, tangerines, lemons, limes, blueberries, raspberries, strawberries, grapes, cantaloupe, honeydew melon, kiwi, papaya, passion fruit, and pineapple. Limited amounts of dried cranberries, banana chips, and shredded coconut. Vegetables Eggplant, zucchini, cucumber, peppers, green beans, bean sprouts, lettuce, arugula, kale, Swiss chard, spinach, collard greens, bok choy, summer squash, potato, and tomato. Limited amounts of corn, carrot, and sweet potato. Green parts of scallions. Grains Gluten-free grains, such as rice, oats, buckwheat, quinoa, corn, polenta, and millet. Gluten-free pasta, bread, or cereal. Rice noodles. Corn tortillas. Meats and other proteins Unseasoned beef, pork, poultry, or fish. Eggs. Berniece Salines. Tofu (firm) and tempeh. Limited amounts of nuts and seeds, such as almonds, walnuts, [place] nuts, pecans, peanuts, nut butters, pumpkin seeds, chia seeds, and sunflower seeds. Dairy Lactose-free milk, yogurt, and kefir. Lactose-free cottage cheese and ice cream. Non-dairy milks, such as almond, coconut, hemp, and rice milk. Non-dairy yogurt. Limited amounts of goat cheese, brie, mozzarella, parmesan, swiss, and other hard cheeses. Fats and oils Butter-free spreads. Vegetable oils, such as olive, canola, and sunflower oil. Seasoning and other foods Artificial sweeteners with names that do not end in "ol," such as aspartame, saccharine, and stevia. Maple syrup, white table sugar, raw sugar, [place]ri sugar, and molasses. Mayonnaise, soy sauce, and tamari. Fresh basil, coriander, parsley, rosemary, and thyme. Beverages Water and mineral water. Sugar-sweetened soft drinks. Small amounts of orange juice or  cranberry juice. Black and green  tea. Most dry wines. Coffee. The items listed above may not be a complete list of foods and beverages you can eat. Contact a dietitian for more information. Foods to avoid Fruits Fresh, dried, and juiced forms of apple, pear, watermelon, peach, plum, cherries, apricots, blackberries, boysenberries, figs, nectarines, and mango. Avocado. Vegetables Chicory root, artichoke, asparagus, cabbage, snow peas, Brussels sprouts, broccoli, sugar snap peas, mushrooms, celery, and cauliflower. Onions, garlic, leeks, and the white part of scallions. Grains Wheat, including kamut, durum, and semolina. Barley and bulgur. Couscous. Wheat-based cereals. Wheat noodles, bread, crackers, and pastries. Meats and other proteins Fried or fatty meat. Sausage. Cashews and pistachios. Soybeans, baked beans, black beans, chickpeas, kidney beans, fava beans, navy beans, lentils, black-eyed peas, and split peas. Dairy Milk, yogurt, ice cream, and soft cheese. Cream and sour cream. Milk-based sauces. Custard. Buttermilk. Soy milk. Seasoning and other foods Any sugar-free gum or candy. Foods that contain artificial sweeteners such as sorbitol, mannitol, isomalt, or xylitol. Foods that contain honey, high-fructose corn syrup, or agave. Bouillon, vegetable stock, beef stock, and chicken stock. Garlic and onion powder. Condiments made with onion, such as hummus, chutney, pickles, relish, salad dressing, and salsa. Tomato paste. Beverages Chicory-based drinks. Coffee substitutes. Chamomile tea. Fennel tea. Sweet or fortified wines such as port or sherry. Diet soft drinks made with isomalt, mannitol, maltitol, sorbitol, or xylitol. Apple, pear, and mango juice. Juices with high-fructose corn syrup. The items listed above may not be a complete list of foods and beverages you should avoid. Contact a dietitian for more information. Summary FODMAP stands for fermentable oligosaccharides, disaccharides, monosaccharides, and polyols. These  are sugars that are hard for some people to digest. A low-FODMAP eating plan is a short-term diet that helps to ease symptoms of certain bowel diseases. The eating plan usually lasts up to 6 weeks. After that, high-FODMAP foods are reintroduced gradually and one at a time. This can help you find out which foods may be causing symptoms. A low-FODMAP eating plan can be complicated. It is best to work with a dietitian who has experience with this type of plan. This information is not intended to replace advice given to you by your health care provider. Make sure you discuss any questions you have with your health care provider. Document Revised: 01/23/2020 Document Reviewed: 01/23/2020 Elsevier Patient Education  [place] for Gastroesophageal Reflux Disease, Adult When you have gastroesophageal reflux disease (GERD), the foods you eat and your eating habits are very important. Choosing the right foods can help ease your discomfort. Think about working with a food expert (dietitian) to help you make good choices. What are tips for following this plan? Reading food labels Look for foods that are low in saturated fat. Foods that may help with your symptoms include: Foods that have less than 5% of daily value (DV) of fat. Foods that have 0 grams of trans fat. Cooking Do not fry your food. Cook your food by baking, steaming, grilling, or broiling. These are all methods that do not need a lot of fat for cooking. To add flavor, try to use herbs that are low in spice and acidity. Meal planning  Choose healthy foods that are low in fat, such as: Fruits and vegetables. Whole grains. Low-fat dairy products. Lean meats, fish, and poultry. Eat small meals often instead of eating 3 large meals each day. Eat your meals slowly in a place where you are relaxed. Avoid bending over or  lying down until 2-3 hours after eating. Limit high-fat foods such as fatty meats or fried foods. Limit  your intake of fatty foods, such as oils, butter, and shortening. Avoid the following as told by your doctor: Foods that cause symptoms. These may be different for different people. Keep a food diary to keep track of foods that cause symptoms. Alcohol. Drinking a lot of liquid with meals. Eating meals during the 2-3 hours before bed. Lifestyle Stay at a healthy weight. Ask your doctor what weight is healthy for you. If you need to lose weight, work with your doctor to do so safely. Exercise for at least 30 minutes on 5 or more days each week, or as told by your doctor. Wear loose-fitting clothes. Do not smoke or use any products that contain nicotine or tobacco. If you need help quitting, ask your doctor. Sleep with the head of your bed higher than your feet. Use a wedge under the mattress or blocks under the bed frame to raise the head of the bed. Chew sugar-free gum after meals. What foods should eat?  Eat a healthy, well-balanced diet of fruits, vegetables, whole grains, low-fat dairy products, lean meats, fish, and poultry. Each person is different. Foods that may cause symptoms in one person may not cause any symptoms in another person. Work with your doctor to find foods that are safe for you. The items listed above may not be a complete list of what you can eat and drink. Contact a food expert for more options. What foods should I avoid? Limiting some of these foods may help in managing the symptoms of GERD. Everyone is different. Talk with a food expert or your doctor to help you find the exact foods to avoid, if any. Fruits Any fruits prepared with added fat. Any fruits that cause symptoms. For some people, this may include citrus fruits, such as oranges, grapefruit, pineapple, and lemons. Vegetables Deep-fried vegetables. [place] fries. Any vegetables prepared with added fat. Any vegetables that cause symptoms. For some people, this may include tomatoes and tomato products, chili  peppers, onions and garlic, and horseradish. Grains Pastries or quick breads with added fat. Meats and other proteins High-fat meats, such as fatty beef or pork, hot dogs, ribs, ham, sausage, salami, and bacon. Fried meat or protein, including fried fish and fried chicken. Nuts and nut butters, in large amounts. Dairy Whole milk and chocolate milk. Sour cream. Cream. Ice cream. Cream cheese. Milkshakes. Fats and oils Butter. Margarine. Shortening. Ghee. Beverages Coffee and tea, with or without caffeine. Carbonated beverages. Sodas. Energy drinks. Fruit juice made with acidic fruits, such as orange or grapefruit. Tomato juice. Alcoholic drinks. Sweets and desserts Chocolate and cocoa. Donuts. Seasonings and condiments Pepper. Peppermint and spearmint. Added salt. Any condiments, herbs, or seasonings that cause symptoms. For some people, this may include curry, hot sauce, or vinegar-based salad dressings. The items listed above may not be a complete list of what you should not eat and drink. Contact a food expert for more options. Questions to ask your doctor Diet and lifestyle changes are often the first steps that are taken to manage symptoms of GERD. If diet and lifestyle changes do not help, talk with your doctor about taking medicines. Where to find more information [employer]: aboutgerd.org Summary When you have GERD, food and lifestyle choices are very important in easing your symptoms. Eat small meals often instead of 3 large meals a day. Eat your meals slowly and in  a place where you are relaxed. Avoid bending over or lying down until 2-3 hours after eating. Limit high-fat foods such as fatty meats or fried foods. This information is not intended to replace advice given to you by your health care provider. Make sure you discuss any questions you have with your health care provider. Document Revised: 03/16/2020 Document Reviewed:  03/16/2020 Elsevier Patient Education  [place].

## 2022-03-15 ENCOUNTER — Other Ambulatory Visit: Payer: Self-pay

## 2022-03-15 ENCOUNTER — Inpatient Hospital Stay: Payer: Medicare PPO | Attending: Oncology

## 2022-03-15 ENCOUNTER — Encounter: Payer: Self-pay | Admitting: Oncology

## 2022-03-15 ENCOUNTER — Inpatient Hospital Stay: Payer: Medicare PPO | Admitting: Oncology

## 2022-03-15 ENCOUNTER — Inpatient Hospital Stay: Payer: Medicare PPO

## 2022-03-15 VITALS — BP 124/80 | HR 71 | Temp 97.1°F | Resp 18 | Wt 168.2 lb

## 2022-03-15 DIAGNOSIS — D0512 Intraductal carcinoma in situ of left breast: Secondary | ICD-10-CM | POA: Insufficient documentation

## 2022-03-15 DIAGNOSIS — Z79811 Long term (current) use of aromatase inhibitors: Secondary | ICD-10-CM | POA: Insufficient documentation

## 2022-03-15 DIAGNOSIS — R17 Unspecified jaundice: Secondary | ICD-10-CM

## 2022-03-15 DIAGNOSIS — M8580 Other specified disorders of bone density and structure, unspecified site: Secondary | ICD-10-CM

## 2022-03-15 DIAGNOSIS — Z170 Estrogen receptor positive status [ER+]: Secondary | ICD-10-CM | POA: Diagnosis not present

## 2022-03-15 DIAGNOSIS — K219 Gastro-esophageal reflux disease without esophagitis: Secondary | ICD-10-CM | POA: Insufficient documentation

## 2022-03-15 DIAGNOSIS — D509 Iron deficiency anemia, unspecified: Secondary | ICD-10-CM | POA: Diagnosis not present

## 2022-03-15 LAB — CBC WITH DIFFERENTIAL/PLATELET
Abs Immature Granulocytes: 0.02 10*3/uL (ref 0.00–0.07)
Basophils Absolute: 0 10*3/uL (ref 0.0–0.1)
Basophils Relative: 1 %
Eosinophils Absolute: 0.2 10*3/uL (ref 0.0–0.5)
Eosinophils Relative: 2 %
HCT: 40.6 % (ref 36.0–46.0)
Hemoglobin: 13.3 g/dL (ref 12.0–15.0)
Immature Granulocytes: 0 %
Lymphocytes Relative: 25 %
Lymphs Abs: 2 10*3/uL (ref 0.7–4.0)
MCH: 29.3 pg (ref 26.0–34.0)
MCHC: 32.8 g/dL (ref 30.0–36.0)
MCV: 89.4 fL (ref 80.0–100.0)
Monocytes Absolute: 0.8 10*3/uL (ref 0.1–1.0)
Monocytes Relative: 10 %
Neutro Abs: 5 10*3/uL (ref 1.7–7.7)
Neutrophils Relative %: 62 %
Platelets: 341 10*3/uL (ref 150–400)
RBC: 4.54 MIL/uL (ref 3.87–5.11)
RDW: 13.5 % (ref 11.5–15.5)
WBC: 8.1 10*3/uL (ref 4.0–10.5)
nRBC: 0 % (ref 0.0–0.2)

## 2022-03-15 LAB — COMPREHENSIVE METABOLIC PANEL
ALT: 12 U/L (ref 0–44)
AST: 16 U/L (ref 15–41)
Albumin: 4.3 g/dL (ref 3.5–5.0)
Alkaline Phosphatase: 39 U/L (ref 38–126)
Anion gap: 7 (ref 5–15)
BUN: 17 mg/dL (ref 8–23)
CO2: 27 mmol/L (ref 22–32)
Calcium: 8.9 mg/dL (ref 8.9–10.3)
Chloride: 99 mmol/L (ref 98–111)
Creatinine, Ser: 0.96 mg/dL (ref 0.44–1.00)
GFR, Estimated: 60 mL/min — ABNORMAL LOW (ref >60)
Glucose, Bld: 98 mg/dL (ref 70–99)
Potassium: 4.1 mmol/L (ref 3.5–5.1)
Sodium: 133 mmol/L — ABNORMAL LOW (ref 135–145)
Total Bilirubin: 1.6 mg/dL — ABNORMAL HIGH (ref 0.3–1.2)
Total Protein: 7.3 g/dL (ref 6.5–8.1)

## 2022-03-15 LAB — BILIRUBIN, DIRECT: Bilirubin, Direct: 0.3 mg/dL — ABNORMAL HIGH (ref 0.0–0.2)

## 2022-03-15 MED ORDER — DENOSUMAB 60 MG/ML ~~LOC~~ SOSY
60.0000 mg | PREFILLED_SYRINGE | Freq: Once | SUBCUTANEOUS | Status: AC
  Administered 2022-03-15: 60 mg via SUBCUTANEOUS
  Filled 2022-03-15: qty 1

## 2022-03-15 NOTE — Progress Notes (Signed)
Patient here for follow up

## 2022-03-17 ENCOUNTER — Encounter: Payer: Self-pay | Admitting: Oncology

## 2022-03-17 NOTE — Progress Notes (Signed)
Hematology/Oncology Progress note [phone] [phone]     Patient Care Team: Jerrol Banana., MD as PCP - General (Family Medicine) Dingeldein, Remo Lipps, MD as Consulting Physician (Ophthalmology) Gae Dry, MD as Referring Physician (Obstetrics and Gynecology) Earlie Server, MD as Consulting Physician (Oncology) Thomes Dinning, MD as Referring Physician (Internal Medicine)  REFERRING PROVIDER: Nilsa Nutting REASON FOR VISIT Follow-up for DCIS  HISTORY OF PRESENTING ILLNESS:  Claire Kane is a  81 y.o.  female with PMH listed below who was referred to me for evaluation of anemia.  Patient reports she was told that she had iron deficiency 2 years ago and she took iron supplementation for a while and was told to stop. Recently found to to have worsening of anemia. She had Upper and lower endoscopy in 2017 by Dr.Sankar # Upper endoscopy 12/07/2017 Normal examined duodenum. - Large hiatal hernia. - Erythematous mucosa in the antrum. Biopsied. - The examination was otherwise normal # Colonoscopy on 12/07/2017  Non-thrombosed external hemorrhoids found on perianal exam. - Diverticulosis in the sigmoid colon and in the descending colon. - The examination was otherwise normal on direct and retroflexion views. - No specimens collected. #  capsule study which did not reveal any active bleeding source.  # Remote history of DCIS right breast in  2002, s/p surgery and RT. Finished 5 years of tamoxifen.   # April - May s/p upper and lower endoscopy, capsule study which did not reveal active bleeding source  Celiac panel positive.  # iron deficiency anemia.  Previously she has received IV Venofer. Patient has previously underwent extensive gastroenterology work-up including EGD, colonoscopy, capsule study which did not reveal active bleeding source.  #08/18/2020, screening mammogram showed left breast calcification. 09/07/2020, diagnostic left mammogram showed  an area of 1.4 cm group of calcifications in the upper outer left breast.  Patient underwent breast biopsy, pathology came back positive for high-grade DCIS, focal necrosis, associated with calcifications.  ER status deferred to existing specimen.  # #10/07/2020, status post left DCIS lumpectomy by Dr. Bary Castilla Pathology showed residual high-grade DCIS with associated calcifications.  Extent of DCIS at least 12 mm, grade 3, central necrosis, anterior and inferior margins were unifocal in positive for DCIS pTis, ER positive.  11/09/2020 reexcision showed no residual DCIS or invasive carcinoma. 01/04/2021, adjuvant breast radiation  Patient reports family history of VTE daughter who was tested positive for mutation. Significant family history of cardiovascular disease, familiar hypercholesterolemia. Parents, sister, brother passed away from heart attack.  Patient declined genetic testing.  01/08/2021, started on Arimidex.  09/01/2021, switched to Aromasin due to arthralgia.   Interval History 81 year old female with pertinent hematology history listed above reviewed by me today presents to reestablish care for left breast DCIS.   Patient takes Aromasin and tolerates well. + GERD, patient was seen by gastroenterology and she is recommended to take Protonix 40 mg twice daily.  Lifestyle modification.     Review of Systems  Constitutional:  Negative for chills, diaphoresis, fever, malaise/fatigue and weight loss.  HENT:  Negative for ear discharge, nosebleeds and sore throat.   Eyes:  Negative for double vision, photophobia, discharge and redness.  Respiratory:  Negative for cough, hemoptysis, sputum production, shortness of breath and wheezing.   Cardiovascular:  Negative for chest pain, palpitations, orthopnea, claudication and leg swelling.  Gastrointestinal:  Negative for abdominal pain, blood in stool, heartburn, nausea and vomiting.       Acid reflux  Genitourinary:  Negative for  dysuria  and urgency.  Musculoskeletal:  Positive for joint pain. Negative for back pain and neck pain.  Skin:  Negative for itching and rash.  Neurological:  Negative for dizziness, tingling, tremors and sensory change.  Endo/Heme/Allergies:  Negative for environmental allergies. Does not bruise/bleed easily.  Psychiatric/Behavioral:  Negative for depression, hallucinations, substance abuse and suicidal ideas.     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   BRCA1 and BRCA2   Breast cancer ([place]) 2002   DCIS right breast    Breast cancer ([place]) 09/2020   left breast   Cataract    GERD (gastroesophageal reflux disease)    Heart murmur 2001   Hiatal hernia    High cholesterol    Hypertension    patient denies anyone telling her that she has htn   Personal history of radiation therapy 2002, 2022   BREAST CA   Rosacea    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 complications     SURGICAL HISTORY: Past Surgical History:  Procedure Laterality Date   ABDOMINAL HYSTERECTOMY     BLEPHAROPLASTY     BREAST BIOPSY Left 09/15/2020   affirm bx, x marker, DCIS   BREAST CYST ASPIRATION Left 2003   BREAST EXCISIONAL BIOPSY Right 2002   POS   BREAST LUMPECTOMY Right 2002   w/ radiation   BREAST LUMPECTOMY Left 10/07/2020   RESIDUAL HIGH-GRADE DUCTAL CARCINOMA IN SITU (DCIS) DCIS is focally present 1.4 mm to the superior margin.   BREAST LUMPECTOMY Left    re-excision   BREAST LUMPECTOMY WITH NEEDLE LOCALIZATION Left 10/07/2020   Procedure: BREAST LUMPECTOMY WITH NEEDLE LOCALIZATION;  Surgeon: Robert Bellow, MD;  Location: [facility] ORS;  Service: General;  Laterality: Left;   BREAST SURGERY Right 2002   lumpectomy   CATARACT EXTRACTION W/ INTRAOCULAR LENS  IMPLANT, BILATERAL     CHOLECYSTECTOMY     COLONOSCOPY  2013   COLONOSCOPY WITH PROPOFOL N/A 12/08/2015    Procedure: COLONOSCOPY WITH PROPOFOL;  Surgeon: Christene Lye, MD;  Location: [facility] ENDOSCOPY;  Service: Endoscopy;  Laterality: N/A;   COLONOSCOPY WITH PROPOFOL N/A 12/06/2017   Procedure: COLONOSCOPY WITH PROPOFOL;  Surgeon: Jonathon Bellows, MD;  Location: [facility] ENDOSCOPY;  Service: Gastroenterology;  Laterality: N/A;   COLONOSCOPY WITH PROPOFOL N/A 12/22/2017   Procedure: COLONOSCOPY WITH PROPOFOL;  Surgeon: Jonathon Bellows, MD;  Location: [facility] ENDOSCOPY;  Service: Gastroenterology;  Laterality: N/A;   ESOPHAGOGASTRODUODENOSCOPY (EGD) WITH PROPOFOL N/A 12/08/2015   Procedure: ESOPHAGOGASTRODUODENOSCOPY (EGD) WITH PROPOFOL;  Surgeon: Christene Lye, MD;  Location: [facility] ENDOSCOPY;  Service: Endoscopy;  Laterality: N/A;   ESOPHAGOGASTRODUODENOSCOPY (EGD) WITH PROPOFOL N/A 12/06/2017   Procedure: ESOPHAGOGASTRODUODENOSCOPY (EGD) WITH PROPOFOL;  Surgeon: Jonathon Bellows, MD;  Location: [facility] ENDOSCOPY;  Service: Gastroenterology;  Laterality: N/A;   ESOPHAGOGASTRODUODENOSCOPY (EGD) WITH PROPOFOL N/A 12/22/2017   Procedure: ESOPHAGOGASTRODUODENOSCOPY (EGD) WITH PROPOFOL;  Surgeon: Jonathon Bellows, MD;  Location: [facility] ENDOSCOPY;  Service: Gastroenterology;  Laterality: N/A;   EYE SURGERY Right 2014   EYE SURGERY Left 2014   GIVENS CAPSULE STUDY N/A 01/23/2018   Procedure: GIVENS CAPSULE STUDY;  Surgeon: Jonathon Bellows, MD;  Location: [facility] ENDOSCOPY;  Service: Gastroenterology;  Laterality: N/A;   iron infusion     had 5 iron infusions for anemia   post radiation therapy Right    right breast cancer 2002  RE-EXCISION OF BREAST CANCER,SUPERIOR MARGINS Left 11/09/2020   Procedure: RE-EXCISION OF BREAST CANCER,SUPERIOR MARGINS;  Surgeon: Robert Bellow, MD;  Location: [facility] ORS;  Service: General;  Laterality: Left;   Stab pheblectomy   2010   vein closure procedure Bilateral 2008    SOCIAL HISTORY: Social History   Socioeconomic History   Marital status: Married    Spouse name: Fritz Pickerel   Number of  children: 2   Years of education: Not on file   Highest education level: Bachelor's degree (e.g., BA, AB, BS)  Occupational History   Occupation: Pharmacist, hospital    Comment: retired  Tobacco Use   Smoking status: Never   Smokeless tobacco: Never  Vaping Use   Vaping Use: Never used  Substance and Sexual Activity   Alcohol use: No   Drug use: No   Sexual activity: Never  Other Topics Concern   Not on file  Social History Narrative   Patient lives with husband. She feels safe in her home.   Social Determinants of Health   Financial Resource Strain: Low Risk  (12/06/2021)   Overall Financial Resource Strain (CARDIA)    Difficulty of Paying Living Expenses: Not hard at all  Food Insecurity: No Food Insecurity (12/06/2021)   Hunger Vital Sign    Worried About Running Out of Food in the Last Year: Never true    Ran Out of Food in the Last Year: Never true  Transportation Needs: No Transportation Needs (12/06/2021)   PRAPARE - Hydrologist (Medical): No    Lack of Transportation (Non-Medical): No  Physical Activity: Inactive (06/11/2019)   Exercise Vital Sign    Days of Exercise per Week: 0 days    Minutes of Exercise per Session: 0 min  Stress: No Stress Concern Present (12/06/2021)   [place]    Feeling of Stress : Only a little  Social Connections: Moderately Isolated (12/06/2021)   Social Connection and Isolation Panel [NHANES]    Frequency of Communication with Friends and Family: More than three times a week    Frequency of Social Gatherings with Friends and Family: Twice a week    Attends Religious Services: Never    Marine scientist or Organizations: No    Attends Archivist Meetings: Never    Marital Status: Married  Human resources officer Violence: Not At Risk (12/06/2021)   Humiliation, Afraid, Rape, and Kick questionnaire    Fear of Current or Ex-Partner: No     Emotionally Abused: No    Physically Abused: No    Sexually Abused: No    FAMILY HISTORY: Family History  Problem Relation Age of Onset   Heart attack Mother    Heart attack Father    Heart attack Sister    Stroke Sister    Heart attack Brother    Other Brother        cerebral hemorrhage, sepsis   Breast cancer Cousin 69       breast/maternal   Cancer Cousin 15       breast/maternal   Breast cancer Cousin    Breast cancer Maternal Aunt 70   Breast cancer Other 48       maternal neice    ALLERGIES:  is allergic to zometa [zoledronic acid].  MEDICATIONS:  Current Outpatient Medications  Medication Sig Dispense Refill   amLODipine (NORVASC) 5 MG tablet TAKE 1 TABLET EVERY DAY 90 tablet 1   atorvastatin (  LIPITOR) 10 MG tablet TAKE 1 TABLET EVERY DAY 90 tablet 2   calcium citrate (CALCITRATE - DOSED IN MG ELEMENTAL CALCIUM) 950 MG tablet Take 200 mg of elemental calcium by mouth 2 (two) times daily.      doxycycline (PERIOSTAT) 20 MG tablet doxycycline hyclate 20 mg tablet  TAKE 1 TABLET BY MOUTH TWICE DAILY     Eflornithine HCl (VANIQA) 13.9 % cream Vaniqa 13.9 % topical cream  APPLY TO UNWANTED HAIR TWICE DAILY     exemestane (AROMASIN) 25 MG tablet Take 1 tablet (25 mg total) by mouth daily after breakfast. 90 tablet 1   ezetimibe (ZETIA) 10 MG tablet TAKE 1 TABLET EVERY DAY 90 tablet 1   Glucosamine 750 MG TABS Take 750 mg by mouth daily.     Glycerin-Polysorbate 80 (REFRESH DRY EYE THERAPY OP) Place 1 drop into both eyes 2 (two) times daily.     Multiple Vitamins-Minerals (MULTIVITAMIN WITH MINERALS) tablet Take 1 tablet by mouth daily.     pantoprazole (PROTONIX) 40 MG tablet Take 1 tablet (40 mg total) by mouth 2 (two) times daily. 180 tablet 3   psyllium (REGULOID) 0.52 G capsule Take 2 capsules by mouth daily.     solifenacin (VESICARE) 10 MG tablet Take 1 tablet (10 mg total) by mouth daily. 90 tablet 3   spironolactone (ALDACTONE) 50 MG tablet Take 50 mg by mouth  daily.     traMADol (ULTRAM) 50 MG tablet TAKE 1 TABLET (50 MG TOTAL) BY MOUTH EVERY 6 (SIX) HOURS AS NEEDED FOR SEVERE PAIN. 100 tablet 2   docusate sodium (COLACE) 100 MG capsule Take 100 mg by mouth daily as needed for mild constipation. (Patient not taking: Reported on 03/15/2022)     SOOLANTRA 1 % CREA Apply 1 application topically daily as needed (rosacea). (Patient not taking: Reported on 03/15/2022)     No current facility-administered medications for this visit.   Facility-Administered Medications Ordered in Other Visits  Medication Dose Route Frequency Provider Last Rate Last Admin   0.9 %  sodium chloride infusion   Intravenous Once Earlie Server, MD         PHYSICAL EXAMINATION: ECOG PERFORMANCE STATUS: 0 - Asymptomatic Vitals:   03/15/22 1411  BP: 124/80  Pulse: 71  Resp: 18  Temp: (!) 97.1 F (36.2 C)   Filed Weights   03/15/22 1411  Weight: 168 lb 3.2 oz (76.3 kg)    Physical Exam Constitutional:      General: She is not in acute distress.    Appearance: She is not diaphoretic.     Comments: Obese  HENT:     Head: Normocephalic and atraumatic.     Nose: Nose normal.     Mouth/Throat:     Pharynx: No oropharyngeal exudate.  Eyes:     General: No scleral icterus.       Right eye: No discharge.        Left eye: No discharge.     Conjunctiva/sclera: Conjunctivae normal.     Pupils: Pupils are equal, round, and reactive to light.  Cardiovascular:     Rate and Rhythm: Normal rate and regular rhythm.     Heart sounds: Normal heart sounds. No murmur heard. Pulmonary:     Effort: Pulmonary effort is normal. No respiratory distress.     Breath sounds: Normal breath sounds. No wheezing or rales.  Chest:     Chest wall: No tenderness.  Abdominal:     General: Bowel sounds are  normal. There is no distension.     Palpations: Abdomen is soft. There is no mass.     Tenderness: There is no abdominal tenderness. There is no guarding.  Musculoskeletal:        General:  Normal range of motion.     Cervical back: Normal range of motion and neck supple.  Lymphadenopathy:     Cervical: No cervical adenopathy.  Skin:    General: Skin is warm and dry.     Findings: No erythema.  Neurological:     Mental Status: She is alert and oriented to person, place, and time.     Cranial Nerves: No cranial nerve deficit.     Motor: No abnormal muscle tone.     Coordination: Coordination normal.  Psychiatric:        Mood and Affect: Affect normal.        Cognition and Memory: Memory normal.        Judgment: Judgment normal.      LABORATORY DATA:  I have reviewed the data as listed Lab Results  Component Value Date   WBC 8.1 03/15/2022   HGB 13.3 03/15/2022   HCT 40.6 03/15/2022   MCV 89.4 03/15/2022   PLT 341 03/15/2022   Recent Labs    09/01/21 1056 12/09/21 1354 03/15/22 1351  NA 135 138 133*  K 4.4 3.9 4.1  CL 99 104 99  CO2 _0 GLUCOSE 119* 99 98  BUN _1 CREATININE 0.89 0.89 0.96  CALCIUM 9.6 9.0 8.9  GFRNONAA >60 >60 60*  PROT 7.9 7.1 7.3  ALBUMIN 4.6 4.2 4.3  AST _2 ALT _3 ALKPHOS 48 44 39  BILITOT 1.5* 1.2 1.6*  BILIDIR  --   --  0.3*     Iron/TIBC/Ferritin/ %Sat    Component Value Date/Time   IRON 73 09/24/2020 1426   IRON 184 (H) 06/18/2020 0929   TIBC 344 09/24/2020 1426   TIBC 314 06/10/2019 1029   FERRITIN 34 09/24/2020 1426   FERRITIN 109 06/10/2019 1029   IRONPCTSAT 21 09/24/2020 1426   IRONPCTSAT 37 06/10/2019 1029     ASSESSMENT & PLAN:  Patient is a 81 year old female present for management of left breast DCIS 1. Ductal carcinoma in situ (DCIS) of left breast   2. Total bilirubin, elevated   3. Osteopenia, unspecified location   4. Aromatase inhibitor use     #Left breast DCIS high grade. pTis, ER positive 2022 Status post left lumpectomy-margin positive status post reexcision achieve negative margin followed by adjuvant breast radiation.Currently on adjuvant endocrine  therapy.  Labs reviewed and discussed with patient.   Continue Aromasin.  Plan for 5 years. Recommend mammogram annually, will be ordered by Dr. Dwyane Luo office.  #GERD, continue Protonix 40 mg twice daily.  Follow-up with gastroenterology. #Osteopenia DEXA 10/28/2020 10-year probability of fracture 12.5% Major Osteoporotic Fracture 2.8% Continue calcium and vitamin D supplementation. Proceed with Prolia today.  #Family history of VTE, cardiovascular disease. Patient is considering varicose vein surgery and is concerned about potential increase VTE risk. Recommend aspirin 81 mg daily.  #Elevated total bilirubin, check to direct bilirubin level- All questions were answered. The patient knows to call the clinic with any problems questions or concerns.  Return of visit: 6 months, lab, CBC CMP, MD plus Prolia  after that consider follow-up every 6 months.  Earlie Server, MD, PhD 03/17/2022

## 2022-04-04 ENCOUNTER — Ambulatory Visit: Payer: Medicare PPO | Admitting: Obstetrics & Gynecology

## 2022-04-04 ENCOUNTER — Encounter: Payer: Self-pay | Admitting: Obstetrics & Gynecology

## 2022-04-04 VITALS — BP 120/70 | Wt 166.4 lb

## 2022-04-04 DIAGNOSIS — N3281 Overactive bladder: Secondary | ICD-10-CM

## 2022-04-04 DIAGNOSIS — N993 Prolapse of vaginal vault after hysterectomy: Secondary | ICD-10-CM | POA: Diagnosis not present

## 2022-04-04 DIAGNOSIS — Z4689 Encounter for fitting and adjustment of other specified devices: Secondary | ICD-10-CM

## 2022-04-04 DIAGNOSIS — N8111 Cystocele, midline: Secondary | ICD-10-CM

## 2022-04-04 MED ORDER — ESTROGENS CONJUGATED 0.625 MG/GM VA CREA: 1.0000 | TOPICAL_CREAM | Freq: Every day | VAGINAL | 12 refills | Status: DC

## 2022-04-04 NOTE — Progress Notes (Signed)
   Subjective:    Patient ID: Claire Kane, female    DOB: March 14, 1941, 82 y.o.   MRN: [id number]  HPI 81 yo P2 here for her q 6 month pessary check. She removes the pessary herself about every month or two. Sometimes it comes out with a large BM. She has some chronic constipation issues.  She also reports dry mouth due to her OAB meds. She is wanting to know what else can be done about this problem.   Review of Systems     Objective:   Physical Exam Well nourished, well hydrated White female, no apparent distress She is ambulating and conversing normally. Genitourinary:             External: Normal external female genitalia.  Normal urethral meatus, normal Bartholin's and Skene's glands.  Moderate/severe V V A             Vagina with Pederson speculum- I removed the cup pessary and then inspected the vaginal vault. There was marked atrophy throughout and a 2 cm excoriation on the right vaginal wall about 2cm in from the introitus. It looks to be about 0.5 cm deep. It is hemostatic.                           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  She will then replace the pessary and remove it once every 2 weeks and leave it out over night.  With regard to her OAB, she is still having issues and I have rec'd a referral to urogyn. She would like to delay that referral at the present time since she is seeing so many doctors at this time.

## 2022-05-16 ENCOUNTER — Ambulatory Visit (INDEPENDENT_AMBULATORY_CARE_PROVIDER_SITE_OTHER): Payer: Medicare PPO | Admitting: Gastroenterology

## 2022-05-16 VITALS — Wt 160.0 lb

## 2022-05-16 DIAGNOSIS — R140 Abdominal distension (gaseous): Secondary | ICD-10-CM | POA: Diagnosis not present

## 2022-05-16 DIAGNOSIS — K219 Gastro-esophageal reflux disease without esophagitis: Secondary | ICD-10-CM | POA: Diagnosis not present

## 2022-05-16 DIAGNOSIS — K449 Diaphragmatic hernia without obstruction or gangrene: Secondary | ICD-10-CM

## 2022-05-16 MED ORDER — OMEPRAZOLE 40 MG PO CPDR: 40.0000 mg | DELAYED_RELEASE_CAPSULE | Freq: Every day | ORAL | 3 refills | Status: DC

## 2022-05-16 MED ORDER — OMEPRAZOLE 20 MG PO CPDR: 20.0000 mg | DELAYED_RELEASE_CAPSULE | Freq: Every day | ORAL | 3 refills | Status: DC

## 2022-05-16 NOTE — Progress Notes (Unsigned)
Jonathon Bellows MD, MRCP(U.K) [address]  [place]  [place], [place] [address]  Main: [phone]  Fax: [phone]   Primary Care Physician: Jerrol Banana., MD  Primary Gastroenterologist:  Dr. Jonathon Bellows   Chief Complaint  Patient presents with   Gastroesophageal Reflux    HPI: KOLBEE STALLMAN is a 81 y.o. female    Summary of history :  Initially referred and seen for GERD on 03/08/2022 ,slight increase in symptoms recently she is on Protonix 40 mg once a day.  She had been having some bloating gaseous distention she consumes soda daily.  No difficulty swallowing no change in bowel habits.. I had seen her back in  June 2019 for iron deficiency anemia.  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 Tasia Catchings for IV iron.     Interval history   03/08/2022-05/16/2022  Doing much better since last visit she cut down all her artificial sugars and sweeteners on Prilosec 40 g twice a day.  She is consuming a lot of xylitol for her dental issues which she has stopped feels much better.  On a low FODMAP diet.  Has not introduce foods back again finding that low FODMAP diet restrictive    Current Outpatient Medications  Medication Sig Dispense Refill   amLODipine (NORVASC) 5 MG tablet TAKE 1 TABLET EVERY DAY 90 tablet 1   atorvastatin (LIPITOR) 10 MG tablet TAKE 1 TABLET EVERY DAY 90 tablet 2   calcium citrate (CALCITRATE - DOSED IN MG ELEMENTAL CALCIUM) 950 MG tablet Take 200 mg of elemental calcium by mouth 2 (two) times daily.      conjugated estrogens (PREMARIN) vaginal cream Place 1 Applicatorful vaginally daily. For only 1 week 42.5 g 12   docusate sodium (COLACE) 100 MG capsule Take 100 mg by mouth daily as needed for mild constipation.     doxycycline (PERIOSTAT) 20 MG tablet      Eflornithine HCl (VANIQA) 13.9 % cream Vaniqa 13.9 % topical cream  APPLY TO UNWANTED HAIR TWICE DAILY      exemestane (AROMASIN) 25 MG tablet Take 1 tablet (25 mg total) by mouth daily after breakfast. 90 tablet 1   ezetimibe (ZETIA) 10 MG tablet TAKE 1 TABLET EVERY DAY 90 tablet 1   Glucosamine 750 MG TABS Take 750 mg by mouth daily.     Glycerin-Polysorbate 80 (REFRESH DRY EYE THERAPY OP) Place 1 drop into both eyes 2 (two) times daily.     Multiple Vitamins-Minerals (MULTIVITAMIN WITH MINERALS) tablet Take 1 tablet by mouth daily.     omeprazole (PRILOSEC) 20 MG capsule Take 1 capsule (20 mg total) by mouth daily. 90 capsule 3   psyllium (REGULOID) 0.52 G capsule Take 2 capsules by mouth daily.     solifenacin (VESICARE) 10 MG tablet Take 1 tablet (10 mg total) by mouth daily. 90 tablet 3   SOOLANTRA 1 % CREA Apply 1 application  topically daily as needed (rosacea).     spironolactone (ALDACTONE) 50 MG tablet Take 50 mg by mouth daily.     traMADol (ULTRAM) 50 MG tablet TAKE 1 TABLET (50 MG TOTAL) BY MOUTH EVERY 6 (SIX) HOURS AS NEEDED FOR SEVERE PAIN. 100 tablet 2   No current facility-administered medications for this visit.   Facility-Administered Medications Ordered in Other Visits  Medication Dose Route Frequency Provider Last Rate Last Admin   0.9 %  sodium chloride infusion  Intravenous Once Earlie Server, MD        Allergies as of 05/16/2022 - Review Complete 05/16/2022  Allergen Reaction Noted   Zometa [zoledronic acid]  12/06/2021    ROS:  General: Negative for anorexia, weight loss, fever, chills, fatigue, weakness. ENT: Negative for hoarseness, difficulty swallowing , nasal congestion. CV: Negative for chest pain, angina, palpitations, dyspnea on exertion, peripheral edema.  Respiratory: Negative for dyspnea at rest, dyspnea on exertion, cough, sputum, wheezing.  GI: See history of present illness. GU:  Negative for dysuria, hematuria, urinary incontinence, urinary frequency, nocturnal urination.  Endo: Negative for unusual weight change.    Physical Examination:   Wt 160 lb  (72.6 kg)   BMI 28.80 kg/m   General: Well-nourished, well-developed in no acute distress.  Eyes: No icterus. Conjunctivae pink. Skin: Warm and dry, no jaundice.   Psych: Alert and cooperative, normal mood and affect.   Imaging Studies: No results found.  Assessment and Plan:   MARYPAT KIMMET is a 81 y.o. y/o femalehere to follow up for GERD.  The EGD that I performed in 2019 demonstrated an 8 cm hiatal hernia which is very likely the underlying cause for the significant reflux which is compounded by the obesity.  Doing well from the reflux point of view.  She was having a lot of gas and bloating the last visit.  Attributed to artificial sugars and sweeteners in her diet.  She was consuming a significant amount of xylitol and at that since reducing quantity she has noted significant improvement in her symptoms she is on a low FODMAP diet at this point of time.  Plan 1.  She is doing well hence reduce the dose of PPI to Prilosec 40 mg once a day as her old dose of medication 2.  As needed use of charcoal 3.  Start reintroducing foods     Dr Jonathon Bellows  MD,MRCP [facility]) Follow up in as needed

## 2022-05-21 ENCOUNTER — Other Ambulatory Visit: Payer: Self-pay | Admitting: Oncology

## 2022-05-21 ENCOUNTER — Other Ambulatory Visit: Payer: Self-pay | Admitting: Family Medicine

## 2022-06-14 ENCOUNTER — Ambulatory Visit: Payer: Self-pay

## 2022-06-14 ENCOUNTER — Ambulatory Visit: Payer: Medicare PPO | Admitting: Obstetrics & Gynecology

## 2022-06-14 NOTE — Patient Outreach (Signed)
  Care Coordination   Initial Visit Note   06/14/2022 Name: Claire Kane MRN: [id number] DOB: [birth year]  Claire Kane is a 81 y.o. year old female who sees Jerrol Banana., MD for primary care. I spoke with  Claire Kane by phone today.  What matters to the patients health and wellness today?  Maintaining her health and well being and being able to take care of her elderly husband with dementia    Goals Addressed             This Visit's Progress    RNCM: Effective Management of health and well being       Care Coordination Interventions: Evaluation of current treatment plan related to GERD and stressors due to her husband having dementia and patient's adherence to plan as established by provider Advised patient to call the office for changes in her chronic conditions, questions, or concerns Provided education to patient re: The book the 36 hour day a resources to help patients and caregivers understand patients with dementia, Alzheimer, and memory changes Reviewed medications with patient and discussed compliance. The patient states she stopped using the premarin after 3 days due to precautions and her history of breast cancer. Education on calling the office for new concerns or questions if she has additional issues before her appointment in November Reviewed scheduled/upcoming provider appointments including 08-18-2022 at 120 pm. Review with the patient that there are providers to help with seeing her since Dr. Rosanna Randy is leaving the practice soon Social Work referral for review of the availability of a LCSW if needed Pharmacy referral for review of pharmacy support if needed Discussed plans with patient for ongoing care management follow up and provided patient with direct contact information for care management team Advised patient to discuss questions, concerns, on needs related to chronic conditions  with provider Screening for signs and symptoms of depression related to  chronic disease state  Assessed social determinant of health barriers The patient agrees to outreaches from the [facility]. Review of the care coordination program and how to reach the [facility]. Provided direct number to the patient. Education and support provided. The patient knows to call for new questions, concerns, or needs.   Active listening / Reflection utilized  Emotional Support Provided         SDOH assessments and interventions completed:  Yes  SDOH Interventions Today    Flowsheet Row Most Recent Value  SDOH Interventions   Food Insecurity Interventions Intervention Not Indicated  Housing Interventions Intervention Not Indicated  Transportation Interventions Intervention Not Indicated  Utilities Interventions Intervention Not Indicated  Alcohol Usage Interventions Intervention Not Indicated (Score <7)  Financial Strain Interventions Intervention Not Indicated  Physical Activity Interventions Other (Comments)  [the patient takes care of her elderly husband who has dementia and does not do structured activity, encouraged self care activities]  Stress Interventions Intervention Not Indicated, Other (Comment)  [the patient states her husband is in the early stages of dementia and that does stress her out at times]  Social Connections Interventions Intervention Not Indicated        Care Coordination Interventions Activated:  Yes  Care Coordination Interventions:  Yes, provided   Follow up plan: Follow up call scheduled for 09-06-2022 at 130 pm    Encounter Outcome:  Pt. Visit Completed   Noreene Larsson RN, MSN, [place]  Mobile: [phone]

## 2022-06-14 NOTE — Patient Instructions (Signed)
Visit Information  Thank you for taking time to visit with me today. Please don't hesitate to contact me if I can be of assistance to you.   Following are the goals we discussed today:   Goals Addressed             This Visit's Progress    RNCM: Effective Management of health and well being       Care Coordination Interventions: Evaluation of current treatment plan related to GERD and stressors due to her husband having dementia and patient's adherence to plan as established by provider Advised patient to call the office for changes in her chronic conditions, questions, or concerns Provided education to patient re: The book the 36 hour day a resources to help patients and caregivers understand patients with dementia, Alzheimer, and memory changes Reviewed medications with patient and discussed compliance. The patient states she stopped using the premarin after 3 days due to precautions and her history of breast cancer. Education on calling the office for new concerns or questions if she has additional issues before her appointment in November Reviewed scheduled/upcoming provider appointments including 08-18-2022 at 120 pm. Review with the patient that there are providers to help with seeing her since Dr. Rosanna Randy is leaving the practice soon Social Work referral for review of the availability of a LCSW if needed Pharmacy referral for review of pharmacy support if needed Discussed plans with patient for ongoing care management follow up and provided patient with direct contact information for care management team Advised patient to discuss questions, concerns, on needs related to chronic conditions  with provider Screening for signs and symptoms of depression related to chronic disease state  Assessed social determinant of health barriers The patient agrees to outreaches from the [facility]. Review of the care coordination program and how to reach the [facility]. Provided direct number to the patient.  Education and support provided. The patient knows to call for new questions, concerns, or needs.   Active listening / Reflection utilized  Emotional Support Provided         Our next appointment is by telephone on 09-06-2022 at 130 pm  Please call the care guide team at [phone] if you need to cancel or reschedule your appointment.   If you are experiencing a Mental Health or [place] or need someone to talk to, please call the Suicide and Crisis Lifeline: 988 call the [place] National Suicide Prevention Lifeline: 484 003 6976 or TTY: (910)334-2535 TTY 4401965806) to talk to a trained counselor call 1-800-273-TALK (toll free, 24 hour hotline)  Patient verbalizes understanding of instructions and care plan provided today and agrees to view in [place]. Active MyChart status and patient understanding of how to access instructions and care plan via MyChart confirmed with patient.     Telephone follow up appointment with care management team member scheduled for: 09-06-2022 at 130 pm  [place], MSN, [place]  Mobile: [phone]

## 2022-07-18 ENCOUNTER — Encounter (INDEPENDENT_AMBULATORY_CARE_PROVIDER_SITE_OTHER): Payer: Self-pay

## 2022-07-25 ENCOUNTER — Other Ambulatory Visit: Payer: Self-pay | Admitting: General Surgery

## 2022-07-25 ENCOUNTER — Ambulatory Visit: Payer: Medicare PPO | Admitting: Obstetrics & Gynecology

## 2022-07-25 VITALS — BP 142/90 | Ht 62.5 in | Wt 157.0 lb

## 2022-07-25 DIAGNOSIS — Z4589 Encounter for adjustment and management of other implanted devices: Secondary | ICD-10-CM

## 2022-07-25 DIAGNOSIS — N993 Prolapse of vaginal vault after hysterectomy: Secondary | ICD-10-CM

## 2022-07-25 DIAGNOSIS — N8111 Cystocele, midline: Secondary | ICD-10-CM

## 2022-07-25 DIAGNOSIS — D0512 Intraductal carcinoma in situ of left breast: Secondary | ICD-10-CM

## 2022-07-25 NOTE — Progress Notes (Signed)
   Established Patient Office Visit  Subjective   Patient ID: Claire Kane, female    DOB: [birth year]  Age: 81 y.o. MRN: [id number]  Chief Complaint  Patient presents with   Pessary Check    HPI   81 yo married P2 here for a pessary check. She has been using a pessary for years, finally found the right one after trying up to 7 different ones. She is happy with this one although it comes out occasionally with a large BM. She has lost weight recently. She maintains her pessary herself, takes it out overnight about every 2 weeks. She is not sexually active.  Her husband has recently been diagnosed with the start of vascular dementia so this has increased her stress level.  Objective:     BP (!) 142/90   Ht 5' 2.5" (1.588 m)   Wt 157 lb (71.2 kg)   BMI 28.26 kg/m    Physical Exam  The ASCVD Risk score (Arnett DK, et al., 2019) failed to calculate for the following reasons:   The 2019 ASCVD risk score is only valid for ages 60 to 75   Well nourished, well hydrated White female, no apparent distress She is ambulating and conversing normally. I removed her pessary and cleaned it. I inspected the entire vagina with a speculum and did not see any excoriations now. She has atrophy. I replaced the pessary.  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    Emily Filbert, MD

## 2022-07-27 ENCOUNTER — Other Ambulatory Visit: Payer: Self-pay | Admitting: Family Medicine

## 2022-07-27 MED ORDER — ATORVASTATIN CALCIUM 10 MG PO TABS
10.0000 mg | ORAL_TABLET | Freq: Every day | ORAL | 0 refills | Status: DC
Start: 2022-07-27 — End: 2022-10-10

## 2022-07-27 NOTE — Telephone Encounter (Signed)
Copied from [place] [id number]. Topic: General - Other >> Jul 27, 2022  2:40 PM Everette C wrote: Reason for CRM: Medication Refill - Medication: atorvastatin (LIPITOR) 10 MG tablet [[id number]]  traMADol (ULTRAM) 50 MG tablet [[id number]]  Has the patient contacted their pharmacy? Yes.  The patient has been directed to contact their PCP  (Agent: If no, request that the patient contact the pharmacy for the refill. If patient does not wish to contact the pharmacy document the reason why and proceed with request.) (Agent: If yes, when and what did the pharmacy advise?)  Preferred Pharmacy (with phone number or street name): [place], [place]: [phone] Fax: [phone]: Not open 24 hours   Has the patient been seen for an appointment in the last year OR does the patient have an upcoming appointment? Yes.    Agent: Please be advised that RX refills may take up to 3 business days. We ask that you follow-up with your pharmacy.

## 2022-07-27 NOTE — Telephone Encounter (Signed)
Requested medication (s) are due for refill today: Yes  Requested medication (s) are on the active medication list: Yes  Last refill:  Atorvastatin 08/01/21 and 11/28/21 for Tramadol  Future visit scheduled: Yes  Notes to clinic:  Unable to refill per protocol due to failed labs, no updated results. Unable to refill per protocol, cannot delegate.      Requested Prescriptions  Pending Prescriptions Disp Refills   atorvastatin (LIPITOR) 10 MG tablet 90 tablet 2    Sig: Take 1 tablet (10 mg total) by mouth daily.     Cardiovascular:  Antilipid - Statins Failed - 07/27/2022  2:57 PM      Failed - Lipid Panel in normal range within the last 12 months    Cholesterol, Total  Date Value Ref Range Status  03/30/2021 123 100 - 199 mg/dL Final   LDL Chol Calc (NIH)  Date Value Ref Range Status  03/30/2021 60 0 - 99 mg/dL Final   HDL  Date Value Ref Range Status  03/30/2021 47 >39 mg/dL Final   Triglycerides  Date Value Ref Range Status  03/30/2021 84 0 - 149 mg/dL Final         Passed - Patient is not pregnant      Passed - Valid encounter within last 12 months    Recent Outpatient Visits           6 months ago Gastroesophageal reflux disease, unspecified whether esophagitis present   [facility] Jerrol Banana., MD   11 months ago Essential hypertension   [facility] Jerrol Banana., MD   1 year ago [place] Quervain's disease (tenosynovitis)   [facility] Mikey Kirschner, PA-C   1 year ago Essential hypertension   [facility] Jerrol Banana., MD   1 year ago Essential hypertension   [facility] Jerrol Banana., MD       Future Appointments             In 3 weeks Simmons-Robinson, Riki Sheer, MD [facility]             traMADol (ULTRAM) 50 MG tablet 100 tablet 2    Sig: Take 1 tablet (50 mg total) by mouth every 6 (six) hours as needed for severe  pain.     Not Delegated - Analgesics:  Opioid Agonists Failed - 07/27/2022  2:57 PM      Failed - This refill cannot be delegated      Failed - Urine Drug Screen completed in last 360 days      Failed - Valid encounter within last 3 months    Recent Outpatient Visits           6 months ago Gastroesophageal reflux disease, unspecified whether esophagitis present   [facility] Jerrol Banana., MD   11 months ago Essential hypertension   [facility] Jerrol Banana., MD   1 year ago [place] Quervain's disease (tenosynovitis)   [facility] Mikey Kirschner, PA-C   1 year ago Essential hypertension   [facility] Jerrol Banana., MD   1 year ago Essential hypertension   [facility] Jerrol Banana., MD       Future Appointments             In 3 weeks Simmons-Robinson, Riki Sheer, MD [facility], [place]

## 2022-08-05 ENCOUNTER — Telehealth: Payer: Self-pay | Admitting: Family Medicine

## 2022-08-05 ENCOUNTER — Other Ambulatory Visit: Payer: Self-pay | Admitting: Family Medicine

## 2022-08-05 DIAGNOSIS — I10 Essential (primary) hypertension: Secondary | ICD-10-CM

## 2022-08-05 MED ORDER — EZETIMIBE 10 MG PO TABS: 10.0000 mg | ORAL_TABLET | Freq: Every day | ORAL | 0 refills | Status: DC

## 2022-08-05 MED ORDER — AMLODIPINE BESYLATE 5 MG PO TABS: 5.0000 mg | ORAL_TABLET | Freq: Every day | ORAL | 0 refills | Status: DC

## 2022-08-05 NOTE — Telephone Encounter (Signed)
[facility] faxed refill request for the following medications:   amLODipine (NORVASC) 5 MG tablet    ezetimibe (ZETIA) 10 MG tablet   Please advise.

## 2022-08-05 NOTE — Telephone Encounter (Signed)
Requested medication (s) are due for refill today: yes  Requested medication (s) are on the active medication list: yes  Last refill:  11/28/21  Future visit scheduled: yes  Notes to clinic:  Unable to refill per protocol, cannot delegate.      Requested Prescriptions  Pending Prescriptions Disp Refills   traMADol (ULTRAM) 50 MG tablet 100 tablet 2    Sig: Take 1 tablet (50 mg total) by mouth every 6 (six) hours as needed for severe pain.     Not Delegated - Analgesics:  Opioid Agonists Failed - 08/05/2022  3:10 PM      Failed - This refill cannot be delegated      Failed - Urine Drug Screen completed in last 360 days      Failed - Valid encounter within last 3 months    Recent Outpatient Visits           6 months ago Gastroesophageal reflux disease, unspecified whether esophagitis present   [facility] Jerrol Banana., MD   1 year ago Essential hypertension   [facility] Jerrol Banana., MD   1 year ago [place] Quervain's disease (tenosynovitis)   [facility] Mikey Kirschner, PA-C   1 year ago Essential hypertension   [facility] Jerrol Banana., MD   1 year ago Essential hypertension   [facility] Jerrol Banana., MD       Future Appointments             In 3 weeks Simmons-Robinson, Riki Sheer, MD [facility], [place]

## 2022-08-05 NOTE — Telephone Encounter (Signed)
Medication Refill - Medication: traMADol (ULTRAM) 50 MG tablet [[id number]]   Pt reports that she has 30 pills left  Has the patient contacted their pharmacy? Yes.   (Agent: If no, request that the patient contact the pharmacy for the refill. If patient does not wish to contact the pharmacy document the reason why and proceed with request.) (Agent: If yes, when and what did the pharmacy advise?)  Preferred Pharmacy (with phone number or street name):  [place], [address] [address], [place] [address]     Has the patient been seen for an appointment in the last year OR does the patient have an upcoming appointment? Yes.    Agent: Please be advised that RX refills may take up to 3 business days. We ask that you follow-up with your pharmacy.

## 2022-08-08 MED ORDER — TRAMADOL HCL 50 MG PO TABS: 50.0000 mg | ORAL_TABLET | Freq: Two times a day (BID) | ORAL | 0 refills | Status: DC | PRN

## 2022-08-18 ENCOUNTER — Encounter: Payer: Medicare PPO | Admitting: Family Medicine

## 2022-08-23 ENCOUNTER — Ambulatory Visit
Admission: RE | Admit: 2022-08-23 | Discharge: 2022-08-23 | Disposition: A | Discharge status: Home / Self Care | Payer: Medicare PPO | Source: Ambulatory Visit | Attending: General Surgery | Admitting: General Surgery

## 2022-08-23 ENCOUNTER — Other Ambulatory Visit: Payer: Self-pay | Admitting: General Surgery

## 2022-08-23 DIAGNOSIS — D0512 Intraductal carcinoma in situ of left breast: Secondary | ICD-10-CM | POA: Diagnosis present

## 2022-08-23 DIAGNOSIS — R921 Mammographic calcification found on diagnostic imaging of breast: Secondary | ICD-10-CM

## 2022-08-23 DIAGNOSIS — R928 Other abnormal and inconclusive findings on diagnostic imaging of breast: Secondary | ICD-10-CM

## 2022-08-31 NOTE — Progress Notes (Signed)
I,Claire Kane,acting as a scribe for [employer], MD.,have documented all relevant documentation on the behalf of Claire Foster, MD,as directed by  Claire Foster, MD while in the presence of Claire Foster, MD.    Complete physical exam   Patient: Claire Kane   DOB: May 20, 1941   81 y.o. Female  MRN: [id number] Visit Date: 09/01/2022  Today's healthcare provider: Eulis Foster, MD   Chief Complaint  Patient presents with  . Annual Exam   Subjective    Claire Kane is a 81 y.o. female who presents today for a complete physical exam.   She reports consuming a general diet. The patient does not participate in regular exercise at present. She generally feels well. She reports sleeping fairly well. She does not have additional problems to discuss today.  HPI  AWV done 12/06/2021  Past Medical History:  Diagnosis Date  . Anemia    fe deficiency now improved at age 56  . Anxiety   . Arthritis   . 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 Breast cancer ([place]) 09/2020   left breast  . Cataract   . GERD (gastroesophageal reflux disease)   . Heart murmur 2001  . Hiatal hernia   . High cholesterol   . Hypertension    patient denies anyone telling her that she has htn  . Personal history of radiation therapy 2002, 2022   BREAST CA  . Rosacea   . 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 complications    Past Surgical History:  Procedure Laterality Date  . ABDOMINAL HYSTERECTOMY    . BLEPHAROPLASTY    . BREAST BIOPSY Left 09/15/2020   affirm bx, x marker, DCIS  . BREAST CYST ASPIRATION Left 2003  . BREAST EXCISIONAL BIOPSY Right 2002   POS  . BREAST LUMPECTOMY Right 2002   w/ radiation  . 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  Marland Kitchen BREAST LUMPECTOMY  Left    re-excision  . BREAST LUMPECTOMY WITH NEEDLE LOCALIZATION Left 10/07/2020   Procedure: BREAST LUMPECTOMY WITH NEEDLE LOCALIZATION;  Surgeon: Robert Bellow, MD;  Location: [facility] ORS;  Service: General;  Laterality: Left;  . BREAST SURGERY Right 2002   lumpectomy  . CATARACT EXTRACTION W/ INTRAOCULAR LENS  IMPLANT, BILATERAL    . CHOLECYSTECTOMY    . COLONOSCOPY  2013  . COLONOSCOPY WITH PROPOFOL N/A 12/08/2015   Procedure: COLONOSCOPY WITH PROPOFOL;  Surgeon: Christene Lye, MD;  Location: [facility] ENDOSCOPY;  Service: Endoscopy;  Laterality: N/A;  . COLONOSCOPY WITH PROPOFOL N/A 12/06/2017   Procedure: COLONOSCOPY WITH PROPOFOL;  Surgeon: Jonathon Bellows, MD;  Location: [facility] ENDOSCOPY;  Service: Gastroenterology;  Laterality: N/A;  . COLONOSCOPY WITH PROPOFOL N/A 12/22/2017   Procedure: COLONOSCOPY WITH PROPOFOL;  Surgeon: Jonathon Bellows, MD;  Location: [facility] ENDOSCOPY;  Service: Gastroenterology;  Laterality: N/A;  . ESOPHAGOGASTRODUODENOSCOPY (EGD) WITH PROPOFOL N/A 12/08/2015   Procedure: ESOPHAGOGASTRODUODENOSCOPY (EGD) WITH PROPOFOL;  Surgeon: Christene Lye, MD;  Location: [facility] ENDOSCOPY;  Service: Endoscopy;  Laterality: N/A;  . ESOPHAGOGASTRODUODENOSCOPY (EGD) WITH PROPOFOL N/A 12/06/2017   Procedure: ESOPHAGOGASTRODUODENOSCOPY (EGD) WITH PROPOFOL;  Surgeon: Jonathon Bellows, MD;  Location: [facility] ENDOSCOPY;  Service: Gastroenterology;  Laterality: N/A;  . ESOPHAGOGASTRODUODENOSCOPY (EGD) WITH PROPOFOL N/A 12/22/2017   Procedure: ESOPHAGOGASTRODUODENOSCOPY (EGD) WITH PROPOFOL;  Surgeon: Jonathon Bellows, MD;  Location: [facility] ENDOSCOPY;  Service: Gastroenterology;  Laterality: N/A;  . EYE SURGERY Right 2014  . EYE SURGERY Left 2014  . GIVENS CAPSULE STUDY N/A 01/23/2018   Procedure: GIVENS CAPSULE STUDY;  Surgeon: Jonathon Bellows, MD;  Location: [facility] ENDOSCOPY;  Service: Gastroenterology;  Laterality: N/A;  . iron infusion     had 5 iron infusions for anemia  . post radiation therapy  Right    right breast cancer 2002  . RE-EXCISION OF BREAST CANCER,SUPERIOR MARGINS Left 11/09/2020   Procedure: RE-EXCISION OF BREAST CANCER,SUPERIOR MARGINS;  Surgeon: Robert Bellow, MD;  Location: [facility] ORS;  Service: General;  Laterality: Left;  . Stab pheblectomy   2010  . vein closure procedure Bilateral 2008   Social History   Socioeconomic History  . Marital status: Married    Spouse name: Claire Kane  . Number of children: 2  . Years of education: Not on file  . Highest education level: Bachelor's degree (e.g., BA, AB, BS)  Occupational History  . Occupation: Pharmacist, hospital    Comment: retired  Tobacco Use  . Smoking status: Never  . Smokeless tobacco: Never  Vaping Use  . Vaping Use: Never used  Substance and Sexual Activity  . Alcohol use: No  . Drug use: No  . Sexual activity: Never  Other Topics Concern  . Not on file  Social History Narrative   Patient lives with husband. She feels safe in her home.   Social Determinants of Health   Financial Resource Strain: Low Risk  (06/14/2022)   Overall Financial Resource Strain (CARDIA)   . Difficulty of Paying Living Expenses: Not hard at all  Food Insecurity: No Food Insecurity (06/14/2022)   Hunger Vital Sign   . Worried About Charity fundraiser in the Last Year: Never true   . Ran Out of Food in the Last Year: Never true  Transportation Needs: No Transportation Needs (06/14/2022)   PRAPARE - Transportation   . Lack of Transportation (Medical): No   . Lack of Transportation (Non-Medical): No  Physical Activity: Inactive (06/14/2022)   Exercise Vital Sign   . Days of Exercise per Week: 0 days   . Minutes of Exercise per Session: 0 min  Stress: No Stress Concern Present (06/14/2022)   [place]   . Feeling of Stress : Only a little  Social Connections: Moderately Isolated (06/14/2022)   Social Connection and Isolation Panel [NHANES]   . Frequency of  Communication with Friends and Family: More than three times a week   . Frequency of Social Gatherings with Friends and Family: Twice a week   . Attends Religious Services: Never   . Active Member of Clubs or Organizations: No   . Attends Archivist Meetings: Never   . Marital Status: Married  Human resources officer Violence: Not At Risk (06/14/2022)   Humiliation, Afraid, Rape, and Kick questionnaire   . Fear of Current or Ex-Partner: No   . Emotionally Abused: No   . Physically Abused: No   . Sexually Abused: No   Family Status  Relation Name Status  . Mother  Deceased at age 16  . Father  Deceased at age 8  . Sister  Deceased at age 47  . Brother  Deceased at age 24  . Brother  Deceased at age 32  . Brother  Alive  . Cousin  (Not Specified)  . Cousin  (Not Specified)  . Mat Aunt  (Not Specified)  .  Other  (Not Specified)   Family History  Problem Relation Age of Onset  . Heart attack Mother   . Heart attack Father   . Heart attack Sister   . Stroke Sister   . Heart attack Brother   . Other Brother        cerebral hemorrhage, sepsis  . Breast cancer Cousin 48       breast/maternal  . Cancer Cousin 30       breast/maternal  . Breast cancer Cousin   . Breast cancer Maternal Aunt 70  . Breast cancer Other 48       maternal neice   Allergies  Allergen Reactions  . Zometa [Zoledronic Acid]     Severe back pain    Patient Care Team: Claire Foster, MD as PCP - General (Family Medicine) Dingeldein, Remo Lipps, MD as Consulting Physician (Ophthalmology) Gae Dry, MD as Referring Physician (Obstetrics and Gynecology) Earlie Server, MD as Consulting Physician (Oncology) Thomes Dinning, MD as Referring Physician (Internal Medicine) Vanita Ingles, RN as Case Manager (General Practice)   Medications: Outpatient Medications Prior to Visit  Medication Sig  . amLODipine (NORVASC) 5 MG tablet Take 1 tablet (5 mg total) by mouth daily.  Marland Kitchen atorvastatin  (LIPITOR) 10 MG tablet Take 1 tablet (10 mg total) by mouth daily.  . calcium citrate (CALCITRATE - DOSED IN MG ELEMENTAL CALCIUM) 950 MG tablet Take 200 mg of elemental calcium by mouth 2 (two) times daily.   Marland Kitchen conjugated estrogens (PREMARIN) vaginal cream Place 1 Applicatorful vaginally daily. For only 1 week  . docusate sodium (COLACE) 100 MG capsule Take 100 mg by mouth daily as needed for mild constipation.  Marland Kitchen doxycycline (PERIOSTAT) 20 MG tablet   . exemestane (AROMASIN) 25 MG tablet TAKE 1 TABLET EVERY DAY AFTER BREAKFAST  . ezetimibe (ZETIA) 10 MG tablet Take 1 tablet (10 mg total) by mouth daily.  . Glucosamine 750 MG TABS Take 750 mg by mouth daily.  . Glycerin-Polysorbate 80 (REFRESH DRY EYE THERAPY OP) Place 1 drop into both eyes 2 (two) times daily.  . Multiple Vitamins-Minerals (MULTIVITAMIN WITH MINERALS) tablet Take 1 tablet by mouth daily.  Marland Kitchen omeprazole (PRILOSEC) 40 MG capsule Take 1 capsule (40 mg total) by mouth daily.  . psyllium (REGULOID) 0.52 G capsule Take 2 capsules by mouth daily.  . solifenacin (VESICARE) 10 MG tablet Take 1 tablet (10 mg total) by mouth daily.  . SOOLANTRA 1 % CREA Apply 1 application  topically daily as needed (rosacea).  Marland Kitchen spironolactone (ALDACTONE) 50 MG tablet Take 50 mg by mouth daily.  . traMADol (ULTRAM) 50 MG tablet Take 1 tablet (50 mg total) by mouth every 12 (twelve) hours as needed for severe pain.  . Eflornithine HCl (VANIQA) 13.9 % cream Vaniqa 13.9 % topical cream  APPLY TO UNWANTED HAIR TWICE DAILY   Facility-Administered Medications Prior to Visit  Medication Dose Route Frequency Provider  . 0.9 %  sodium chloride infusion   Intravenous Once Earlie Server, MD    Review of Systems  Eyes:  Positive for discharge and itching.  Gastrointestinal:  Positive for constipation.  Endocrine: Positive for polyuria.  Genitourinary:  Positive for dysuria.  Musculoskeletal:        Rosacea    {Labs  Heme  Chem  Endocrine  Serology   Results Review (optional):23779}  Objective    BP 136/76 (BP Location: Right Arm, Patient Position: Sitting, Cuff Size: Normal)   Pulse 80   Temp  98.4 F (36.9 C) (Oral)   Resp 16   Ht 5' 2.5" (1.588 m)   Wt 154 lb 8 oz (70.1 kg)   BMI 27.81 kg/m  {Show previous vital signs (optional):23777}   Physical Exam Vitals reviewed.  Constitutional:      General: She is not in acute distress.    Appearance: Normal appearance. She is not ill-appearing, toxic-appearing or diaphoretic.  HENT:     Head: Normocephalic and atraumatic.     Right Ear: Tympanic membrane and external ear normal. There is no impacted cerumen.     Left Ear: Tympanic membrane and external ear normal. There is no impacted cerumen.     Nose: Nose normal.     Mouth/Throat:     Pharynx: Oropharynx is clear.  Eyes:     General: No scleral icterus.    Extraocular Movements: Extraocular movements intact.     Conjunctiva/sclera: Conjunctivae normal.     Pupils: Pupils are equal, round, and reactive to light.  Cardiovascular:     Rate and Rhythm: Normal rate and regular rhythm.     Pulses: Normal pulses.     Heart sounds: Normal heart sounds. No murmur heard.    No friction rub. No gallop.  Pulmonary:     Effort: Pulmonary effort is normal. No respiratory distress.     Breath sounds: Normal breath sounds. No wheezing, rhonchi or rales.  Abdominal:     General: Bowel sounds are normal. There is no distension.     Palpations: Abdomen is soft. There is no mass.     Tenderness: There is no abdominal tenderness. There is no guarding.  Musculoskeletal:        General: No deformity.     Cervical back: Normal range of motion and neck supple. No rigidity.     Right lower leg: No edema.     Left lower leg: No edema.  Lymphadenopathy:     Cervical: No cervical adenopathy.  Skin:    General: Skin is warm.     Capillary Refill: Capillary refill takes less than 2 seconds.     Findings: No erythema or rash.  Neurological:      General: No focal deficit present.     Mental Status: She is alert and oriented to person, place, and time.     Motor: No weakness.     Gait: Gait normal.  Psychiatric:        Mood and Affect: Mood normal.        Behavior: Behavior normal.     Last depression screening scores    09/01/2022   10:45 AM 06/14/2022    1:14 PM 01/13/2022    9:15 AM  PHQ 2/9 Scores  PHQ - 2 Score 0 0 0  PHQ- 9 Score 2     Last fall risk screening    09/01/2022   10:44 AM  Fall Risk   Falls in the past year? 1  Number falls in past yr: 1  Injury with Fall? 0  Risk for fall due to : Impaired balance/gait  Follow up Falls evaluation completed   Last Audit-C alcohol use screening    09/01/2022   10:46 AM  Alcohol Use Disorder Test (AUDIT)  1. How often do you have a drink containing alcohol? 0  2. How many drinks containing alcohol do you have on a typical day when you are drinking? 0  3. How often do you have six or more drinks on one occasion? 0  AUDIT-C Score 0   A score of 3 or more in women, and 4 or more in men indicates increased risk for alcohol abuse, EXCEPT if all of the points are from question 1   Results for orders placed or performed in visit on 09/01/22  Lipid panel  Result Value Ref Range   Cholesterol, Total 122 100 - 199 mg/dL   Triglycerides 48 0 - 149 mg/dL   HDL 53 >39 mg/dL   VLDL Cholesterol Cal 11 5 - 40 mg/dL   LDL Chol Calc (NIH) 58 0 - 99 mg/dL   Chol/HDL Ratio 2.3 0.0 - 4.4 ratio  Hemoglobin A1c  Result Value Ref Range   Hgb A1c MFr Bld 5.8 (H) 4.8 - 5.6 %   Est. average glucose Bld gHb Est-mCnc 120 mg/dL  Comprehensive metabolic panel  Result Value Ref Range   Glucose 105 (H) 70 - 99 mg/dL   BUN 12 8 - 27 mg/dL   Creatinine, Ser 0.84 0.57 - 1.00 mg/dL   eGFR 70 >59 mL/min/1.73   BUN/Creatinine Ratio 14 12 - 28   Sodium 142 134 - 144 mmol/L   Potassium 4.5 3.5 - 5.2 mmol/L   Chloride 104 96 - 106 mmol/L   CO2 23 20 - 29 mmol/L   Calcium 9.1 8.7 -  10.3 mg/dL   Total Protein 6.8 6.0 - 8.5 g/dL   Albumin 4.6 3.7 - 4.7 g/dL   Globulin, Total 2.2 1.5 - 4.5 g/dL   Albumin/Globulin Ratio 2.1 1.2 - 2.2   Bilirubin Total 1.3 (H) 0.0 - 1.2 mg/dL   Alkaline Phosphatase 46 44 - 121 IU/L   AST 19 0 - 40 IU/L   ALT 17 0 - 32 IU/L  CBC  Result Value Ref Range   WBC 7.7 3.4 - 10.8 x10E3/uL   RBC 4.37 3.77 - 5.28 x10E6/uL   Hemoglobin 13.4 11.1 - 15.9 g/dL   Hematocrit 39.0 34.0 - 46.6 %   MCV 89 79 - 97 fL   MCH 30.7 26.6 - 33.0 pg   MCHC 34.4 31.5 - 35.7 g/dL   RDW 12.1 11.7 - 15.4 %   Platelets 381 150 - 450 x10E3/uL    Assessment & Plan    Routine Health Maintenance and Physical Exam  Exercise Activities and Dietary recommendations  Goals     . DIET - EAT MORE FRUITS AND VEGETABLES    . DIET - INCREASE WATER INTAKE     Recommend increasing water intake to 4-6 glasses a day and decreasing soda and tea intake.     Marland Kitchen Exercise      Recommend increasing exercise by walking 4 days a week for 25 minutes.     Marland Kitchen LIFESTYLE - DECREASE FALLS RISK     Recommend to remove any items from the home that may cause slips or trips.    Marland Kitchen RNCM: Effective Management of health and well being     Care Coordination Interventions: Evaluation of current treatment plan related to GERD and stressors due to her husband having dementia and patient's adherence to plan as established by provider Advised patient to call the office for changes in her chronic conditions, questions, or concerns Provided education to patient re: The book the 36 hour day a resources to help patients and caregivers understand patients with dementia, Alzheimer, and memory changes Reviewed medications with patient and discussed compliance. The patient states she stopped using the premarin after 3 days due to precautions and her history of  breast cancer. Education on calling the office for new concerns or questions if she has additional issues before her appointment in November Reviewed  scheduled/upcoming provider appointments including 08-18-2022 at 120 pm. Review with the patient that there are providers to help with seeing her since Dr. Rosanna Randy is leaving the practice soon Social Work referral for review of the availability of a LCSW if needed Pharmacy referral for review of pharmacy support if needed Discussed plans with patient for ongoing care management follow up and provided patient with direct contact information for care management team Advised patient to discuss questions, concerns, on needs related to chronic conditions  with provider Screening for signs and symptoms of depression related to chronic disease state  Assessed social determinant of health barriers The patient agrees to outreaches from the [facility]. Review of the care coordination program and how to reach the [facility]. Provided direct number to the patient. Education and support provided. The patient knows to call for new questions, concerns, or needs.   Active listening / Reflection utilized  Emotional Support Provided         Immunization History  Administered Date(s) Administered  . Fluad Quad(high Dose 65+) 07/23/2020  . Influenza, High Dose Seasonal PF 05/28/2015, 07/05/2017, 06/10/2019  . Influenza,inj,Quad PF,6+ Mos 06/27/2018  . Influenza-Unspecified 05/09/2016  . PFIZER Comirnaty(Gray Top)Covid-19 Tri-Sucrose Vaccine 01/12/2021, 06/13/2021  . PFIZER(Purple Top)SARS-COV-2 Vaccination 10/08/2019, 11/02/2019, 06/25/2020  . Pneumococcal Conjugate-13 04/22/2014  . Pneumococcal Polysaccharide-23 03/15/2011  . Td 09/06/2007, 12/30/2012  . Tdap 04/22/2014  . Zoster, Live 06/27/2011    Health Maintenance  Topic Date Due  . INFLUENZA VACCINE  04/19/2022  . COVID-19 Vaccine (6 - 2023-24 season) 05/20/2022  . Zoster Vaccines- Shingrix (1 of 2) 12/01/2022 (Originally 05/13/1960)  . Medicare Annual Wellness (AWV)  09/02/2023  . DTaP/Tdap/Td (4 - Td or Tdap) 04/22/2024  . Pneumonia Vaccine 65+ Years  old  Completed  . DEXA SCAN  Completed  . HPV VACCINES  Aged Out    Discussed health benefits of physical activity, and encouraged her to engage in regular exercise appropriate for her age and condition.  Problem List Items Addressed This Visit       Other   Hyperlipidemia   Relevant Orders   Lipid panel (Completed)   Acid indigestion   Relevant Orders   Comprehensive metabolic panel (Completed)   Iron deficiency anemia   Relevant Orders   CBC (Completed)   Other Visit Diagnoses     Encounter for annual physical exam    -  Primary   Prediabetes       Relevant Orders   Hemoglobin A1c (Completed)   Comprehensive metabolic panel (Completed)        Return in about 6 months (around 03/03/2023) for chronic dz.     I, Claire Foster, MD, have reviewed all documentation for this visit.  Portions of this information were initially documented by the CMA and reviewed by me for thoroughness and accuracy.      Claire Foster, MD  [facility] [phone] (phone) [phone] (fax)  [place]

## 2022-09-01 ENCOUNTER — Ambulatory Visit (INDEPENDENT_AMBULATORY_CARE_PROVIDER_SITE_OTHER): Payer: Medicare PPO | Admitting: Family Medicine

## 2022-09-01 ENCOUNTER — Encounter: Payer: Self-pay | Admitting: Family Medicine

## 2022-09-01 VITALS — BP 136/76 | HR 80 | Temp 98.4°F | Resp 16 | Ht 62.5 in | Wt 154.5 lb

## 2022-09-01 DIAGNOSIS — K30 Functional dyspepsia: Secondary | ICD-10-CM | POA: Diagnosis not present

## 2022-09-01 DIAGNOSIS — Z0000 Encounter for general adult medical examination without abnormal findings: Secondary | ICD-10-CM

## 2022-09-01 DIAGNOSIS — E7849 Other hyperlipidemia: Secondary | ICD-10-CM | POA: Diagnosis not present

## 2022-09-01 DIAGNOSIS — D509 Iron deficiency anemia, unspecified: Secondary | ICD-10-CM | POA: Diagnosis not present

## 2022-09-01 DIAGNOSIS — R7303 Prediabetes: Secondary | ICD-10-CM | POA: Diagnosis not present

## 2022-09-01 NOTE — Patient Instructions (Signed)
Health Maintenance for Postmenopausal Women Menopause is a normal process in which your ability to get pregnant comes to an end. This process happens slowly over many months or years, usually between the ages of 48 and 55. Menopause is complete when you have missed your menstrual period for 12 months. It is important to talk with your health care provider about some of the most common conditions that affect women after menopause (postmenopausal women). These include heart disease, cancer, and bone loss (osteoporosis). Adopting a healthy lifestyle and getting preventive care can help to promote your health and wellness. The actions you take can also lower your chances of developing some of these common conditions. What are the signs and symptoms of menopause? During menopause, you may have the following symptoms: Hot flashes. These can be moderate or severe. Night sweats. Decrease in sex drive. Mood swings. Headaches. Tiredness (fatigue). Irritability. Memory problems. Problems falling asleep or staying asleep. Talk with your health care provider about treatment options for your symptoms. Do I need hormone replacement therapy? Hormone replacement therapy is effective in treating symptoms that are caused by menopause, such as hot flashes and night sweats. Hormone replacement carries certain risks, especially as you become older. If you are thinking about using estrogen or estrogen with progestin, discuss the benefits and risks with your health care provider. How can I reduce my risk for heart disease and stroke? The risk of heart disease, heart attack, and stroke increases as you age. One of the causes may be a change in the body's hormones during menopause. This can affect how your body uses dietary fats, triglycerides, and cholesterol. Heart attack and stroke are medical emergencies. There are many things that you can do to help prevent heart disease and stroke. Watch your blood pressure High  blood pressure causes heart disease and increases the risk of stroke. This is more likely to develop in people who have high blood pressure readings or are overweight. Have your blood pressure checked: Every 3-5 years if you are 18-39 years of age. Every year if you are 40 years old or older. Eat a healthy diet  Eat a diet that includes plenty of vegetables, fruits, low-fat dairy products, and lean protein. Do not eat a lot of foods that are high in solid fats, added sugars, or sodium. Get regular exercise Get regular exercise. This is one of the most important things you can do for your health. Most adults should: Try to exercise for at least 150 minutes each week. The exercise should increase your heart rate and make you sweat (moderate-intensity exercise). Try to do strengthening exercises at least twice each week. Do these in addition to the moderate-intensity exercise. Spend less time sitting. Even light physical activity can be beneficial. Other tips Work with your health care provider to achieve or maintain a healthy weight. Do not use any products that contain nicotine or tobacco. These products include cigarettes, chewing tobacco, and vaping devices, such as e-cigarettes. If you need help quitting, ask your health care provider. Know your numbers. Ask your health care provider to check your cholesterol and your blood sugar (glucose). Continue to have your blood tested as directed by your health care provider. Do I need screening for cancer? Depending on your health history and family history, you may need to have cancer screenings at different stages of your life. This may include screening for: Breast cancer. Cervical cancer. Lung cancer. Colorectal cancer. What is my risk for osteoporosis? After menopause, you may be   at increased risk for osteoporosis. Osteoporosis is a condition in which bone destruction happens more quickly than new bone creation. To help prevent osteoporosis or  the bone fractures that can happen because of osteoporosis, you may take the following actions: If you are 19-50 years old, get at least 1,000 mg of calcium and at least 600 international units (IU) of vitamin D per day. If you are older than age 50 but younger than age 70, get at least 1,200 mg of calcium and at least 600 international units (IU) of vitamin D per day. If you are older than age 70, get at least 1,200 mg of calcium and at least 800 international units (IU) of vitamin D per day. Smoking and drinking excessive alcohol increase the risk of osteoporosis. Eat foods that are rich in calcium and vitamin D, and do weight-bearing exercises several times each week as directed by your health care provider. How does menopause affect my mental health? Depression may occur at any age, but it is more common as you become older. Common symptoms of depression include: Feeling depressed. Changes in sleep patterns. Changes in appetite or eating patterns. Feeling an overall lack of motivation or enjoyment of activities that you previously enjoyed. Frequent crying spells. Talk with your health care provider if you think that you are experiencing any of these symptoms. General instructions See your health care provider for regular wellness exams and vaccines. This may include: Scheduling regular health, dental, and eye exams. Getting and maintaining your vaccines. These include: Influenza vaccine. Get this vaccine each year before the flu season begins. Pneumonia vaccine. Shingles vaccine. Tetanus, diphtheria, and pertussis (Tdap) booster vaccine. Your health care provider may also recommend other immunizations. Tell your health care provider if you have ever been abused or do not feel safe at home. Summary Menopause is a normal process in which your ability to get pregnant comes to an end. This condition causes hot flashes, night sweats, decreased interest in sex, mood swings, headaches, or lack  of sleep. Treatment for this condition may include hormone replacement therapy. Take actions to keep yourself healthy, including exercising regularly, eating a healthy diet, watching your weight, and checking your blood pressure and blood sugar levels. Get screened for cancer and depression. Make sure that you are up to date with all your vaccines. This information is not intended to replace advice given to you by your health care provider. Make sure you discuss any questions you have with your health care provider. Document Revised: 01/25/2021 Document Reviewed: 01/25/2021 Elsevier Patient Education  2023 [employer]  

## 2022-09-02 LAB — COMPREHENSIVE METABOLIC PANEL
ALT: 17 IU/L (ref 0–32)
AST: 19 IU/L (ref 0–40)
Albumin/Globulin Ratio: 2.1 (ref 1.2–2.2)
Albumin: 4.6 g/dL (ref 3.7–4.7)
Alkaline Phosphatase: 46 IU/L (ref 44–121)
BUN/Creatinine Ratio: 14 (ref 12–28)
BUN: 12 mg/dL (ref 8–27)
Bilirubin Total: 1.3 mg/dL — ABNORMAL HIGH (ref 0.0–1.2)
CO2: 23 mmol/L (ref 20–29)
Calcium: 9.1 mg/dL (ref 8.7–10.3)
Chloride: 104 mmol/L (ref 96–106)
Creatinine, Ser: 0.84 mg/dL (ref 0.57–1.00)
Globulin, Total: 2.2 g/dL (ref 1.5–4.5)
Glucose: 105 mg/dL — ABNORMAL HIGH (ref 70–99)
Potassium: 4.5 mmol/L (ref 3.5–5.2)
Sodium: 142 mmol/L (ref 134–144)
Total Protein: 6.8 g/dL (ref 6.0–8.5)
eGFR: 70 mL/min/{1.73_m2} (ref >59)

## 2022-09-02 LAB — LIPID PANEL
Chol/HDL Ratio: 2.3 ratio (ref 0.0–4.4)
Cholesterol, Total: 122 mg/dL (ref 100–199)
HDL: 53 mg/dL (ref >39)
LDL Chol Calc (NIH): 58 mg/dL (ref 0–99)
Triglycerides: 48 mg/dL (ref 0–149)
VLDL Cholesterol Cal: 11 mg/dL (ref 5–40)

## 2022-09-02 LAB — CBC
Hematocrit: 39 % (ref 34.0–46.6)
Hemoglobin: 13.4 g/dL (ref 11.1–15.9)
MCH: 30.7 pg (ref 26.6–33.0)
MCHC: 34.4 g/dL (ref 31.5–35.7)
MCV: 89 fL (ref 79–97)
Platelets: 381 10*3/uL (ref 150–450)
RBC: 4.37 x10E6/uL (ref 3.77–5.28)
RDW: 12.1 % (ref 11.7–15.4)
WBC: 7.7 10*3/uL (ref 3.4–10.8)

## 2022-09-02 LAB — HEMOGLOBIN A1C
Est. average glucose Bld gHb Est-mCnc: 120 mg/dL
Hgb A1c MFr Bld: 5.8 % — ABNORMAL HIGH (ref 4.8–5.6)

## 2022-09-05 DIAGNOSIS — R7303 Prediabetes: Secondary | ICD-10-CM | POA: Insufficient documentation

## 2022-09-06 ENCOUNTER — Ambulatory Visit: Payer: Self-pay | Admitting: *Deleted

## 2022-09-06 NOTE — Patient Instructions (Signed)
Visit Information  Thank you for taking time to visit with me today. Please don't hesitate to contact me if I can be of assistance to you before our next scheduled telephone appointment.  Following are the goals we discussed today:  Keep and attend biopsy tomorrow. Call oncology office to have them set you up with new provider since Dr. Fleet Contras is retiring.   Our next appointment is by telephone on 12/28  Please call the care guide team at [phone] if you need to cancel or reschedule your appointment.   Please call the Suicide and Crisis Lifeline: 988 call the [place] National Suicide Prevention Lifeline: 804-660-3222 or TTY: 669-685-3224 TTY (605) 385-2581) to talk to a trained counselor call 1-800-273-TALK (toll free, 24 hour hotline) call 911 if you are experiencing a Mental Health or [place] or need someone to talk to.  Patient verbalizes understanding of instructions and care plan provided today and agrees to view in [place]. Active MyChart status and patient understanding of how to access instructions and care plan via MyChart confirmed with patient.     The patient has been provided with contact information for the care management team and has been advised to call with any health related questions or concerns.   Valente David, RN, MSN, [place] [facility] Care Management Coordinator [phone]

## 2022-09-06 NOTE — Patient Outreach (Signed)
  Care Coordination   Follow Up Visit Note   09/06/2022 Name: Claire Kane MRN: [id number] DOB: 08-02-1941  Claire Kane is a 81 y.o. year old female who sees Simmons-Robinson, Riki Sheer, MD for primary care. I spoke with  [name] by phone today.  What matters to the patients health and wellness today?  Mammogram completed, will go for further testing/biopsy tomorrow.     Goals Addressed             This Visit's Progress    RNCM: Effective Management of health and well being   On track    Care Coordination Interventions: Evaluation of current treatment plan related to GERD and stressors due to her husband having dementia and patient's adherence to plan as established by provider Advised patient to call the office for changes in her chronic conditions, questions, or concerns Reviewed medications with patient and discussed adherence Reviewed scheduled/upcoming provider appointments including biopsy on tomorrow.  Will have follow up for results after.  Was seen by PCP on 12/14, 6 month follow up in June 2024.  AWV scheduled for 3/21 Discussed plans with patient for ongoing care management follow up and provided patient with direct contact information for care management team Advised patient to discuss questions, concerns, on needs related to chronic conditions  with provider The patient agrees to outreaches from the [facility]. Review of the care coordination program and how to reach the [facility]. Provided direct number to the patient. Education and support provided. The patient knows to call for new questions, concerns, or needs.          SDOH assessments and interventions completed:  No     Care Coordination Interventions:  Yes, provided   Follow up plan: Follow up call scheduled for 12/28    Encounter Outcome:  Pt. Visit Completed   Valente David, RN, MSN, [place] Care Management Care Management Coordinator [phone]

## 2022-09-07 ENCOUNTER — Ambulatory Visit
Admission: RE | Admit: 2022-09-07 | Discharge: 2022-09-07 | Disposition: A | Discharge status: Home / Self Care | Payer: Medicare PPO | Source: Ambulatory Visit | Attending: General Surgery | Admitting: General Surgery

## 2022-09-07 DIAGNOSIS — R921 Mammographic calcification found on diagnostic imaging of breast: Secondary | ICD-10-CM | POA: Insufficient documentation

## 2022-09-07 DIAGNOSIS — R928 Other abnormal and inconclusive findings on diagnostic imaging of breast: Secondary | ICD-10-CM | POA: Insufficient documentation

## 2022-09-07 HISTORY — PX: BREAST BIOPSY: SHX20

## 2022-09-07 MED ORDER — LIDOCAINE HCL (PF) 1 % IJ SOLN
20.0000 mL | Freq: Once | INTRAMUSCULAR | Status: AC
  Administered 2022-09-07: 20 mL
  Filled 2022-09-07: qty 20

## 2022-09-07 MED ORDER — LIDOCAINE-EPINEPHRINE 1 %-1:100000 IJ SOLN
10.0000 mL | Freq: Once | INTRAMUSCULAR | Status: AC
  Administered 2022-09-07: 10 mL
  Filled 2022-09-07: qty 10

## 2022-09-08 LAB — SURGICAL PATHOLOGY

## 2022-09-14 ENCOUNTER — Encounter: Payer: Self-pay | Admitting: Oncology

## 2022-09-14 ENCOUNTER — Inpatient Hospital Stay: Payer: Medicare PPO

## 2022-09-14 ENCOUNTER — Inpatient Hospital Stay (HOSPITAL_BASED_OUTPATIENT_CLINIC_OR_DEPARTMENT_OTHER): Payer: Medicare PPO | Admitting: Oncology

## 2022-09-14 ENCOUNTER — Inpatient Hospital Stay: Payer: Medicare PPO | Attending: Oncology

## 2022-09-14 VITALS — BP 138/68 | HR 75 | Temp 96.9°F | Resp 18 | Wt 158.6 lb

## 2022-09-14 DIAGNOSIS — Z170 Estrogen receptor positive status [ER+]: Secondary | ICD-10-CM | POA: Insufficient documentation

## 2022-09-14 DIAGNOSIS — D0512 Intraductal carcinoma in situ of left breast: Secondary | ICD-10-CM | POA: Diagnosis present

## 2022-09-14 DIAGNOSIS — M8580 Other specified disorders of bone density and structure, unspecified site: Secondary | ICD-10-CM | POA: Diagnosis not present

## 2022-09-14 DIAGNOSIS — Z79811 Long term (current) use of aromatase inhibitors: Secondary | ICD-10-CM | POA: Diagnosis not present

## 2022-09-14 DIAGNOSIS — C50811 Malignant neoplasm of overlapping sites of right female breast: Secondary | ICD-10-CM | POA: Diagnosis not present

## 2022-09-14 DIAGNOSIS — D509 Iron deficiency anemia, unspecified: Secondary | ICD-10-CM | POA: Diagnosis not present

## 2022-09-14 DIAGNOSIS — R17 Unspecified jaundice: Secondary | ICD-10-CM

## 2022-09-14 LAB — CBC WITH DIFFERENTIAL/PLATELET
Abs Immature Granulocytes: 0.03 10*3/uL (ref 0.00–0.07)
Basophils Absolute: 0.1 10*3/uL (ref 0.0–0.1)
Basophils Relative: 1 %
Eosinophils Absolute: 0.2 10*3/uL (ref 0.0–0.5)
Eosinophils Relative: 2 %
HCT: 38 % (ref 36.0–46.0)
Hemoglobin: 12.8 g/dL (ref 12.0–15.0)
Immature Granulocytes: 0 %
Lymphocytes Relative: 20 %
Lymphs Abs: 2 10*3/uL (ref 0.7–4.0)
MCH: 30.6 pg (ref 26.0–34.0)
MCHC: 33.7 g/dL (ref 30.0–36.0)
MCV: 90.9 fL (ref 80.0–100.0)
Monocytes Absolute: 1 10*3/uL (ref 0.1–1.0)
Monocytes Relative: 10 %
Neutro Abs: 6.8 10*3/uL (ref 1.7–7.7)
Neutrophils Relative %: 67 %
Platelets: 332 10*3/uL (ref 150–400)
RBC: 4.18 MIL/uL (ref 3.87–5.11)
RDW: 12.7 % (ref 11.5–15.5)
WBC: 10.1 10*3/uL (ref 4.0–10.5)
nRBC: 0 % (ref 0.0–0.2)

## 2022-09-14 LAB — COMPREHENSIVE METABOLIC PANEL
ALT: 20 U/L (ref 0–44)
AST: 21 U/L (ref 15–41)
Albumin: 4.4 g/dL (ref 3.5–5.0)
Alkaline Phosphatase: 39 U/L (ref 38–126)
Anion gap: 9 (ref 5–15)
BUN: 12 mg/dL (ref 8–23)
CO2: 27 mmol/L (ref 22–32)
Calcium: 8.9 mg/dL (ref 8.9–10.3)
Chloride: 101 mmol/L (ref 98–111)
Creatinine, Ser: 0.67 mg/dL (ref 0.44–1.00)
GFR, Estimated: >60 mL/min (ref >60)
Glucose, Bld: 89 mg/dL (ref 70–99)
Potassium: 4.3 mmol/L (ref 3.5–5.1)
Sodium: 137 mmol/L (ref 135–145)
Total Bilirubin: 1 mg/dL (ref 0.3–1.2)
Total Protein: 7 g/dL (ref 6.5–8.1)

## 2022-09-14 MED ORDER — EXEMESTANE 25 MG PO TABS: ORAL_TABLET | ORAL | 1 refills | Status: DC

## 2022-09-14 MED ORDER — DENOSUMAB 60 MG/ML ~~LOC~~ SOSY
60.0000 mg | PREFILLED_SYRINGE | Freq: Once | SUBCUTANEOUS | Status: AC
  Administered 2022-09-14: 60 mg via SUBCUTANEOUS
  Filled 2022-09-14: qty 1

## 2022-09-14 NOTE — Progress Notes (Addendum)
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 She tolerates with mild difficulties. Labs are reviewed with patient. Continue Aromasin plan for total 5 years. Continue annual mammogram-patient plans to get images done through surgeons office.  Osteopenia DEXA 10/28/2020 10-year probability of fracture 12.5% Major Osteoporotic Fracture 2.8% Continue calcium and vitamin D supplementation. Proceed with Prolia today. Repeat DEXA prior to next visit.  Hyperbilirubinemia Intermittent hyperbilirubinemia, primarily indirect bilirubinemia Reassurance provided to patient.  No intervention needed.  Today's bilirubin is normal.  Orders Placed This Encounter  Procedures   DG Bone Density    Standing Status:   Future    Standing Expiration Date:   09/15/2023    Order Specific Question:   Reason for Exam (SYMPTOM  OR DIAGNOSIS REQUIRED)    Answer:   history of breast cancer    Order Specific Question:   Preferred imaging location?    Answer:   [facility]   CBC with Differential/Platelet    Standing Status:   Future    Standing Expiration Date:   09/15/2023   Comprehensive metabolic panel    Standing Status:   Future    Standing Expiration Date:   09/14/2023   Follow up in 6 months.  All questions were answered. The  patient knows to call the clinic with any problems, questions or concerns.  Earlie Server, MD, PhD [facility] Hematology Oncology 09/14/2022   Orders Placed This Encounter  Procedures   DG Bone Density    Standing Status:   Future    Standing Expiration Date:   09/15/2023    Order Specific Question:   Reason for Exam (SYMPTOM  OR DIAGNOSIS REQUIRED)    Answer:   history of breast cancer    Order Specific Question:   Preferred imaging location?    Answer:   [facility]   CBC with Differential/Platelet    Standing Status:   Future    Standing Expiration Date:   09/15/2023   Comprehensive metabolic panel    Standing Status:   Future    Standing Expiration Date:   09/14/2023   Follow-up in 6 months. All questions were answered. The patient knows to call the clinic with any problems, questions or concerns.  Earlie Server, MD, PhD [facility] Hematology Oncology 09/14/2022    HISTORY OF PRESENTING ILLNESS:  Claire Kane is a  81 y.o.  female with PMH listed below who was referred to me for evaluation of anemia.  Patient reports she was told that she had iron deficiency 2 years ago and she took iron supplementation for a while and was told to stop. Recently found to to have worsening of anemia. She had Upper and lower endoscopy in 2017 by Dr.Sankar # Upper endoscopy 12/07/2017 Normal examined duodenum. - Large hiatal hernia. - Erythematous mucosa in the antrum. Biopsied. - The examination was otherwise normal # Colonoscopy on 12/07/2017  Non-thrombosed external  hemorrhoids found on perianal exam. - Diverticulosis in the sigmoid colon and in the descending colon. - The examination was otherwise normal on direct and retroflexion views. - No specimens collected. #  capsule study which did not reveal any active bleeding source.  # Remote history of DCIS right breast in  2002, s/p surgery and RT. Finished 5 years of tamoxifen.   # April - May s/p upper and lower endoscopy, capsule study  which did not reveal active bleeding source  Celiac panel positive.  # iron deficiency anemia.  Previously she has received IV Venofer. Patient has previously underwent extensive gastroenterology work-up including EGD, colonoscopy, capsule study which did not reveal active bleeding source.  #08/18/2020, screening mammogram showed left breast calcification. 09/07/2020, diagnostic left mammogram showed an area of 1.4 cm group of calcifications in the upper outer left breast.  Patient underwent breast biopsy, pathology came back positive for high-grade DCIS, focal necrosis, associated with calcifications.  ER status deferred to existing specimen.  # #10/07/2020, status post left DCIS lumpectomy by Dr. Bary Castilla Pathology showed residual high-grade DCIS with associated calcifications.  Extent of DCIS at least 12 mm, grade 3, central necrosis, anterior and inferior margins were unifocal in positive for DCIS pTis, ER positive.  11/09/2020 reexcision showed no residual DCIS or invasive carcinoma. 01/04/2021, adjuvant breast radiation  Patient reports family history of VTE daughter who was tested positive for mutation. Significant family history of cardiovascular disease, familiar hypercholesterolemia. Parents, sister, brother passed away from heart attack.  Patient declined genetic testing.  01/08/2021, started on Arimidex.  09/01/2021, switched to Aromasin due to arthralgia.    Interval History 81 year old female with pertinent hematology history listed above reviewed by me today presents to reestablish care for left breast DCIS.   Patient takes Aromasin.  Overall she tolerates well.  She had complaints of arthralgia, especially right hip. 08/23/22 bilateral diagnostic mammogram - Now identified near the LEFT lumpectomy site are small round calcifications in a somewhat linear orientation spanning a distance of 1 cm. Tissue sampling is recommended. 09/07/22 left breast stereotactic core needle  biopsy-benign mammary parenchyma with fibrocystic and fibroadenomatoid changes and focal usual  ductal hyperplasia. No definite evidence of residual atypical proliferative breast disease.   Review of Systems  Constitutional:  Negative for chills, diaphoresis, fever, malaise/fatigue and weight loss.  HENT:  Negative for ear discharge, nosebleeds and sore throat.   Eyes:  Negative for double vision, photophobia, discharge and redness.  Respiratory:  Negative for cough, hemoptysis, sputum production, shortness of breath and wheezing.   Cardiovascular:  Negative for chest pain, palpitations, orthopnea, claudication and leg swelling.  Gastrointestinal:  Negative for abdominal pain, blood in stool, heartburn, nausea and vomiting.       Acid reflux  Genitourinary:  Negative for dysuria and urgency.  Musculoskeletal:  Positive for joint pain. Negative for back pain and neck pain.  Skin:  Negative for itching and rash.  Neurological:  Negative for dizziness, tingling, tremors and sensory change.  Endo/Heme/Allergies:  Negative for environmental allergies. Does not bruise/bleed easily.  Psychiatric/Behavioral:  Negative for depression, hallucinations, substance abuse and suicidal ideas.     MEDICAL HISTORY:  Past Medical History:  Diagnosis Date   Anemia    fe deficiency now improved at age 53   Anxiety    Arthritis    BRCA negative 08/04/2015   BRCA1 and BRCA2   Breast cancer ([place]) 2002   DCIS right breast    Breast cancer ([place]) 09/2020   left  breast   Cataract    GERD (gastroesophageal reflux disease)    Heart murmur 2001   Hiatal hernia    High cholesterol    Hypertension    patient denies anyone telling her that she has htn   Personal history of radiation therapy 2002, 2022   BREAST CA   Rosacea    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 complications     SURGICAL HISTORY: Past Surgical History:  Procedure Laterality Date   ABDOMINAL HYSTERECTOMY     BLEPHAROPLASTY      BREAST BIOPSY Left 09/15/2020   affirm bx, x marker, DCIS   BREAST BIOPSY Left 09/07/2022   Left Breast Stereo Bx X clip- path pending   BREAST BIOPSY Left 09/07/2022   MM LT BREAST BX W LOC DEV 1ST LESION IMAGE BX SPEC STEREO GUIDE 09/07/2022 [facility]   BREAST CYST ASPIRATION Left 2003   BREAST EXCISIONAL BIOPSY Right 2002   POS   BREAST LUMPECTOMY Right 2002   w/ radiation   BREAST LUMPECTOMY Left 10/07/2020   RESIDUAL HIGH-GRADE DUCTAL CARCINOMA IN SITU (DCIS) DCIS is focally present 1.4 mm to the superior margin.   BREAST LUMPECTOMY Left    re-excision   BREAST LUMPECTOMY WITH NEEDLE LOCALIZATION Left 10/07/2020   Procedure: BREAST LUMPECTOMY WITH NEEDLE LOCALIZATION;  Surgeon: Robert Bellow, MD;  Location: [facility] ORS;  Service: General;  Laterality: Left;   BREAST SURGERY Right 2002   lumpectomy   CATARACT EXTRACTION W/ INTRAOCULAR LENS  IMPLANT, BILATERAL     CHOLECYSTECTOMY     COLONOSCOPY  2013   COLONOSCOPY WITH PROPOFOL N/A 12/08/2015   Procedure: COLONOSCOPY WITH PROPOFOL;  Surgeon: Christene Lye, MD;  Location: [facility] ENDOSCOPY;  Service: Endoscopy;  Laterality: N/A;   COLONOSCOPY WITH PROPOFOL N/A 12/06/2017   Procedure: COLONOSCOPY WITH PROPOFOL;  Surgeon: Jonathon Bellows, MD;  Location: [facility] ENDOSCOPY;  Service: Gastroenterology;  Laterality: N/A;   COLONOSCOPY WITH PROPOFOL N/A 12/22/2017   Procedure: COLONOSCOPY WITH PROPOFOL;  Surgeon: Jonathon Bellows, MD;  Location: [facility] ENDOSCOPY;  Service: Gastroenterology;  Laterality: N/A;   ESOPHAGOGASTRODUODENOSCOPY (EGD) WITH PROPOFOL N/A 12/08/2015   Procedure: ESOPHAGOGASTRODUODENOSCOPY (EGD) WITH PROPOFOL;  Surgeon: Christene Lye, MD;  Location: [facility] ENDOSCOPY;  Service: Endoscopy;  Laterality: N/A;   ESOPHAGOGASTRODUODENOSCOPY (EGD) WITH PROPOFOL N/A 12/06/2017   Procedure: ESOPHAGOGASTRODUODENOSCOPY (EGD) WITH PROPOFOL;  Surgeon: Jonathon Bellows, MD;  Location: [facility] ENDOSCOPY;  Service: Gastroenterology;   Laterality: N/A;   ESOPHAGOGASTRODUODENOSCOPY (EGD) WITH PROPOFOL N/A 12/22/2017   Procedure: ESOPHAGOGASTRODUODENOSCOPY (EGD) WITH PROPOFOL;  Surgeon: Jonathon Bellows, MD;  Location: [facility] ENDOSCOPY;  Service: Gastroenterology;  Laterality: N/A;   EYE SURGERY Right 2014   EYE SURGERY Left 2014   GIVENS CAPSULE STUDY N/A 01/23/2018   Procedure: GIVENS CAPSULE STUDY;  Surgeon: Jonathon Bellows, MD;  Location: [facility] ENDOSCOPY;  Service: Gastroenterology;  Laterality: N/A;   iron infusion     had 5 iron infusions for anemia   post radiation therapy Right    right breast cancer 2002   RE-EXCISION OF BREAST CANCER,SUPERIOR MARGINS Left 11/09/2020   Procedure: RE-EXCISION OF BREAST CANCER,SUPERIOR MARGINS;  Surgeon: Robert Bellow, MD;  Location: [facility] ORS;  Service: General;  Laterality: Left;   Stab pheblectomy   2010   vein closure procedure Bilateral 2008    SOCIAL HISTORY: Social History   Socioeconomic History   Marital status: Married    Spouse name: Fritz Pickerel   Number of children: 2   Years of education: Not on file  Highest education level: Bachelor's degree (e.g., BA, AB, BS)  Occupational History   Occupation: Pharmacist, hospital    Comment: retired  Tobacco Use   Smoking status: Never   Smokeless tobacco: Never  Vaping Use   Vaping Use: Never used  Substance and Sexual Activity   Alcohol use: No   Drug use: No   Sexual activity: Never  Other Topics Concern   Not on file  Social History Narrative   Patient lives with husband. She feels safe in her home.   Social Determinants of Health   Financial Resource Strain: Low Risk  (06/14/2022)   Overall Financial Resource Strain (CARDIA)    Difficulty of Paying Living Expenses: Not hard at all  Food Insecurity: No Food Insecurity (06/14/2022)   Hunger Vital Sign    Worried About Running Out of Food in the Last Year: Never true    Ran Out of Food in the Last Year: Never true  Transportation Needs: No Transportation Needs (06/14/2022)   PRAPARE  - Hydrologist (Medical): No    Lack of Transportation (Non-Medical): No  Physical Activity: Inactive (06/14/2022)   Exercise Vital Sign    Days of Exercise per Week: 0 days    Minutes of Exercise per Session: 0 min  Stress: No Stress Concern Present (06/14/2022)   [place]    Feeling of Stress : Only a little  Social Connections: Moderately Isolated (06/14/2022)   Social Connection and Isolation Panel [NHANES]    Frequency of Communication with Friends and Family: More than three times a week    Frequency of Social Gatherings with Friends and Family: Twice a week    Attends Religious Services: Never    Marine scientist or Organizations: No    Attends Archivist Meetings: Never    Marital Status: Married  Human resources officer Violence: Not At Risk (06/14/2022)   Humiliation, Afraid, Rape, and Kick questionnaire    Fear of Current or Ex-Partner: No    Emotionally Abused: No    Physically Abused: No    Sexually Abused: No    FAMILY HISTORY: Family History  Problem Relation Age of Onset   Heart attack Mother    Heart attack Father    Heart attack Sister    Stroke Sister    Heart attack Brother    Other Brother        cerebral hemorrhage, sepsis   Breast cancer Cousin 76       breast/maternal   Cancer Cousin 58       breast/maternal   Breast cancer Cousin    Breast cancer Maternal Aunt 70   Breast cancer Other 48       maternal neice    ALLERGIES:  is allergic to zometa [zoledronic acid].  MEDICATIONS:  Current Outpatient Medications  Medication Sig Dispense Refill   amLODipine (NORVASC) 5 MG tablet Take 1 tablet (5 mg total) by mouth daily. 90 tablet 0   atorvastatin (LIPITOR) 10 MG tablet Take 1 tablet (10 mg total) by mouth daily. 90 tablet 0   calcium citrate (CALCITRATE - DOSED IN MG ELEMENTAL CALCIUM) 950 MG tablet Take 200 mg of elemental calcium by mouth 2  (two) times daily.      docusate sodium (COLACE) 100 MG capsule Take 100 mg by mouth daily as needed for mild constipation.     ezetimibe (ZETIA) 10 MG tablet Take 1 tablet (10 mg total)  by mouth daily. 90 tablet 0   Glucosamine 750 MG TABS Take 750 mg by mouth daily.     Glycerin-Polysorbate 80 (REFRESH DRY EYE THERAPY OP) Place 1 drop into both eyes 2 (two) times daily.     Multiple Vitamins-Minerals (MULTIVITAMIN WITH MINERALS) tablet Take 1 tablet by mouth daily.     omeprazole (PRILOSEC) 40 MG capsule Take 1 capsule (40 mg total) by mouth daily. 90 capsule 3   psyllium (REGULOID) 0.52 G capsule Take 2 capsules by mouth daily.     simethicone (MYLICON) 80 MG chewable tablet Chew 80 mg by mouth every 6 (six) hours as needed for flatulence.     solifenacin (VESICARE) 10 MG tablet Take 1 tablet (10 mg total) by mouth daily. 90 tablet 3   SOOLANTRA 1 % CREA Apply 1 application  topically daily as needed (rosacea).     spironolactone (ALDACTONE) 50 MG tablet Take 50 mg by mouth daily.     traMADol (ULTRAM) 50 MG tablet Take 1 tablet (50 mg total) by mouth every 12 (twelve) hours as needed for severe pain. 60 tablet 0   doxycycline (PERIOSTAT) 20 MG tablet  (Patient not taking: Reported on 09/14/2022)     exemestane (AROMASIN) 25 MG tablet TAKE 1 TABLET EVERY DAY AFTER BREAKFAST 90 tablet 1   No current facility-administered medications for this visit.   Facility-Administered Medications Ordered in Other Visits  Medication Dose Route Frequency Provider Last Rate Last Admin   0.9 %  sodium chloride infusion   Intravenous Once Earlie Server, MD         PHYSICAL EXAMINATION: ECOG PERFORMANCE STATUS: 1 - Symptomatic but completely ambulatory Vitals:   09/14/22 1308  BP: 138/68  Pulse: 75  Resp: 18  Temp: (!) 96.9 F (36.1 C)   Filed Weights   09/14/22 1308  Weight: 158 lb 9.6 oz (71.9 kg)    Physical Exam Constitutional:      General: She is not in acute distress.    Appearance: She is  not diaphoretic.     Comments: Obese  HENT:     Head: Normocephalic and atraumatic.     Nose: Nose normal.     Mouth/Throat:     Pharynx: No oropharyngeal exudate.  Eyes:     General: No scleral icterus.       Right eye: No discharge.        Left eye: No discharge.     Conjunctiva/sclera: Conjunctivae normal.     Pupils: Pupils are equal, round, and reactive to light.  Cardiovascular:     Rate and Rhythm: Normal rate and regular rhythm.     Heart sounds: Normal heart sounds. No murmur heard. Pulmonary:     Effort: Pulmonary effort is normal. No respiratory distress.     Breath sounds: Normal breath sounds. No wheezing or rales.  Chest:     Chest wall: No tenderness.  Abdominal:     General: Bowel sounds are normal. There is no distension.     Palpations: Abdomen is soft. There is no mass.     Tenderness: There is no abdominal tenderness. There is no guarding.  Musculoskeletal:        General: Normal range of motion.     Cervical back: Normal range of motion and neck supple.  Lymphadenopathy:     Cervical: No cervical adenopathy.  Skin:    General: Skin is warm and dry.     Findings: No erythema.  Neurological:  Mental Status: She is alert and oriented to person, place, and time.     Cranial Nerves: No cranial nerve deficit.     Motor: No abnormal muscle tone.     Coordination: Coordination normal.  Psychiatric:        Mood and Affect: Affect normal.        Cognition and Memory: Memory normal.        Judgment: Judgment normal.      LABORATORY DATA:  I have reviewed the data as listed    Latest Ref Rng & Units 09/14/2022   12:39 PM 09/01/2022   11:24 AM 03/15/2022    1:51 PM  CBC  WBC 4.0 - 10.5 K/uL 10.1  7.7  8.1   Hemoglobin 12.0 - 15.0 g/dL 12.8  13.4  13.3   Hematocrit 36.0 - 46.0 % 38.0  39.0  40.6   Platelets 150 - 400 K/uL 332  381  341       Latest Ref Rng & Units 09/14/2022   12:39 PM 09/01/2022   11:24 AM 03/15/2022    1:51 PM  CMP  Glucose  70 - 99 mg/dL 89  105  98   BUN 8 - 23 mg/dL _0 Creatinine 0.44 - 1.00 mg/dL 0.67  0.84  0.96   Sodium 135 - 145 mmol/L 137  142  133   Potassium 3.5 - 5.1 mmol/L 4.3  4.5  4.1   Chloride 98 - 111 mmol/L 101  104  99   CO2 22 - 32 mmol/L _1 Calcium 8.9 - 10.3 mg/dL 8.9  9.1  8.9   Total Protein 6.5 - 8.1 g/dL 7.0  6.8  7.3   Total Bilirubin 0.3 - 1.2 mg/dL 1.0  1.3  1.6   Alkaline Phos 38 - 126 U/L 39  46  39   AST 15 - 41 U/L _2 ALT 0 - 44 U/L 20  17  12

## 2022-09-14 NOTE — Progress Notes (Signed)
Pt here for follow up. Pt reports that she had been having problems with reflux, she is being followed by Dr. Vicente Males. Pt reports that she had a breast biopsy last week and results were negative.

## 2022-09-14 NOTE — Assessment & Plan Note (Signed)
Intermittent hyperbilirubinemia, primarily indirect bilirubinemia Reassurance provided to patient.  No intervention needed.  Today's bilirubin is normal.

## 2022-09-14 NOTE — Assessment & Plan Note (Signed)
DEXA 10/28/2020 10-year probability of fracture 12.5% Major Osteoporotic Fracture 2.8% Continue calcium and vitamin D supplementation. Proceed with Prolia today. Repeat DEXA prior to next visit.

## 2022-09-14 NOTE — Assessment & Plan Note (Signed)
Left breast DCIS high grade. pTis, ER positive 2022 Status post left lumpectomy-margin positive status post reexcision achieve negative margin followed by adjuvant breast radiation.Currently on adjuvant endocrine therapy.   Labs reviewed and discussed with patient.   Continue Aromasin.  Plan for 5 years. Recommend mammogram annually

## 2022-09-14 NOTE — Assessment & Plan Note (Signed)
#  Left breast DCIS high grade. pTis, ER positive 2022 Status post left lumpectomy-margin positive status post reexcision achieve negative margin followed by adjuvant breast radiation.Currently on adjuvant endocrine therapy-Aromasin. She tolerates with mild difficulties. Labs are reviewed with patient. Continue Aromasin plan for total 5 years. Continue annual mammogram-patient plans to get images done through surgeons office.

## 2022-09-15 ENCOUNTER — Other Ambulatory Visit: Payer: Self-pay | Admitting: Family Medicine

## 2022-09-15 ENCOUNTER — Ambulatory Visit: Payer: Self-pay | Admitting: *Deleted

## 2022-09-15 NOTE — Patient Outreach (Signed)
  Care Coordination   Initial Visit Note   09/15/2022 Name: CREEDENCE HEISS MRN: [id number] DOB: 22-Feb-1941  EMONY DORMER is a 81 y.o. year old female who sees Simmons-Robinson, Riki Sheer, MD for primary care. I spoke with  [name] by phone today.  What matters to the patients health and wellness today?  Doing well, grateful that breast biopsy results were negative.      Goals Addressed             This Visit's Progress    COMPLETED: RNCM: Effective Management of health and well being   On track    Care Coordination Interventions: Evaluation of current treatment plan related to GERD and stressors due to her husband having dementia and patient's adherence to plan as established by provider Advised patient to call the office for changes in her chronic conditions, questions, or concerns Reviewed medications with patient and discussed adherence Reviewed scheduled/upcoming provider appointments including biopsy on tomorrow.  Will have follow up for results after.  Was seen by PCP on 12/14, 6 month follow up in June 2024.  AWV scheduled for 3/21 Discussed plans with patient for ongoing care management follow up and provided patient with direct contact information for care management team Advised patient to discuss questions, concerns, on needs related to chronic conditions  with provider The patient agrees to outreaches from the [facility]. Review of the care coordination program and how to reach the [facility]. Provided direct number to the patient. Education and support provided. The patient knows to call for new questions, concerns, or needs.          SDOH assessments and interventions completed:  No     Care Coordination Interventions:  Yes, provided   Follow up plan: No further intervention required.   Encounter Outcome:  Pt. Visit Completed   Valente David, RN, MSN, [place] Care Management Care Management Coordinator [phone]

## 2022-09-21 ENCOUNTER — Ambulatory Visit: Payer: Self-pay

## 2022-09-21 NOTE — Telephone Encounter (Signed)
Message from Alm Bustard sent at 09/21/2022 10:37 AM EST  Summary: sinus infection?   Patient is states that she think she has a sinus infection, she is having stuffy nose, sore throat, dry mouth, and yellow mucus, pt is declining to have a appointment and wants medication called in for her and is requesting to speak with a nurse. Patient states that she has been having symptoms for a week. Please advise.         Chief Complaint: yellow nasal mucous from left nostril Symptoms: teeth pain, nose pain, mild sore throat, post nasal drip, dry mouth, sneezing Frequency: 1 week Pertinent Negatives: Patient denies difficulty breathing, earache Disposition: '[]'$ ED /'[]'$ Urgent Care (no appt availability in office) / '[]'$ Appointment(In office/virtual)/ '[]'$  [facility]/ '[]'$ Home Care/ '[x]'$ Refused Recommended Disposition /'[]'$ [facility]/ '[]'$  Follow-up with PCP Additional Notes: pt refused in office or virtual visit. Pt wanted medication called in stating that was how her sinus infections were previously handled. Advised PCP may want a OV or VV. Pt wants PCP to triage and if PCP wants appt then she prefers a phone appt or virtual appt(stated phone appt is easier)  Reason for Disposition  [1] Sinus congestion as part of a cold AND [2] present < 10 days  Answer Assessment - Initial Assessment Questions 1. LOCATION: "Where does it hurt?"      Teeth pain, nose hurts 2. ONSET: "When did the sinus pain start?"  (e.g., hours, days)      1 week 3. SEVERITY: "How bad is the pain?"   (Scale 1-10; mild, moderate or severe)   - MILD (1-3): doesn't interfere with normal activities    - MODERATE (4-7): interferes with normal activities (e.g., work or school) or awakens from sleep   - SEVERE (8-10): excruciating pain and patient unable to do any normal activities        Mild teeth moderate nose 4. RECURRENT SYMPTOM: "Have you ever had sinus problems before?" If Yes, ask: "When was the last time?"  and "What happened that time?"      Yes- usually yellow mucus 5. NASAL CONGESTION: "Is the nose blocked?" If Yes, ask: "Can you open it or must you breathe through your mouth?"     no 6. NASAL DISCHARGE: "Do you have discharge from your nose?" If so ask, "What color?"     Yellow- thick left nostril 7. FEVER: "Do you have a fever?" If Yes, ask: "What is it, how was it measured, and when did it start?"      no 8. OTHER SYMPTOMS: "Do you have any other symptoms?" (e.g., sore throat, cough, earache, difficulty breathing)     Post nasal drip yellow- mild sore throat, dry mouth, sneezing 9. PREGNANCY: "Is there any chance you are pregnant?" "When was your last menstrual period?"     N/a  Protocols used: Sinus Pain or Congestion-A-AH

## 2022-09-22 ENCOUNTER — Encounter: Payer: Self-pay | Admitting: Family Medicine

## 2022-09-22 ENCOUNTER — Ambulatory Visit (INDEPENDENT_AMBULATORY_CARE_PROVIDER_SITE_OTHER): Payer: Medicare PPO | Admitting: Family Medicine

## 2022-09-22 VITALS — BP 123/73 | HR 75 | Temp 98.7°F | Resp 16 | Wt 157.0 lb

## 2022-09-22 DIAGNOSIS — J0100 Acute maxillary sinusitis, unspecified: Secondary | ICD-10-CM | POA: Insufficient documentation

## 2022-09-22 MED ORDER — AZITHROMYCIN 500 MG PO TABS: 500.0000 mg | ORAL_TABLET | Freq: Every day | ORAL | 0 refills | Status: DC

## 2022-09-22 NOTE — Telephone Encounter (Signed)
Recommend in person evaluation, if no appt available soon enough she can have virtual visit with PCP office or [facility] virtual visits as available    Eulis Foster, MD  [facility]

## 2022-09-22 NOTE — Telephone Encounter (Signed)
Pt called and coming in for appt.

## 2022-09-22 NOTE — Assessment & Plan Note (Signed)
Acute, self limiting Pt notes exposure to sick family member over Christmas holiday Symptoms present and continue from 09/15/22 to current One day of fever Encouraged to use OTC Zyrtec and Flonase to assist Abx provided. RTC if needed. Continue APAP if fevering or throat lozenges to assist with scratchy feeling from PND

## 2022-09-22 NOTE — Progress Notes (Signed)
I,Joseline E Rosas,acting as a scribe for Gwyneth Sprout, FNP.,have documented all relevant documentation on the behalf of Gwyneth Sprout, FNP,as directed by  Gwyneth Sprout, FNP while in the presence of Gwyneth Sprout, FNP.   Established patient visit  Patient: Claire Kane   DOB: [birth year]   82 y.o. Female  MRN: [id number] Visit Date: 09/22/2022  Today's healthcare provider: Gwyneth Sprout, FNP  Introduced to nurse practitioner role and practice setting.  All questions answered.  Discussed provider/patient relationship and expectations.  Chief Complaint  Patient presents with   URI   Subjective    HPI  Patient here today with concerns of possible sinus infection. Associated symptoms: Stuffy nose, sore throat, dry mouth, yellow mucus, post nasal drip and sneezing. She has been having symptoms for a week. Reports at the beginning she has a slight fever but hasn't have one since. Treatments tried: Tylenol, throat Lozenges.  Medications: Outpatient Medications Prior to Visit  Medication Sig   amLODipine (NORVASC) 5 MG tablet Take 1 tablet (5 mg total) by mouth daily.   atorvastatin (LIPITOR) 10 MG tablet Take 1 tablet (10 mg total) by mouth daily.   calcium citrate (CALCITRATE - DOSED IN MG ELEMENTAL CALCIUM) 950 MG tablet Take 200 mg of elemental calcium by mouth 2 (two) times daily.    docusate sodium (COLACE) 100 MG capsule Take 100 mg by mouth daily as needed for mild constipation.   exemestane (AROMASIN) 25 MG tablet TAKE 1 TABLET EVERY DAY AFTER BREAKFAST   ezetimibe (ZETIA) 10 MG tablet Take 1 tablet (10 mg total) by mouth daily.   Glucosamine 750 MG TABS Take 750 mg by mouth daily.   Glycerin-Polysorbate 80 (REFRESH DRY EYE THERAPY OP) Place 1 drop into both eyes 2 (two) times daily.   Multiple Vitamins-Minerals (MULTIVITAMIN WITH MINERALS) tablet Take 1 tablet by mouth daily.   omeprazole (PRILOSEC) 40 MG capsule Take 1 capsule (40 mg total) by mouth daily.   psyllium  (REGULOID) 0.52 G capsule Take 2 capsules by mouth daily.   simethicone (MYLICON) 80 MG chewable tablet Chew 80 mg by mouth every 6 (six) hours as needed for flatulence.   solifenacin (VESICARE) 10 MG tablet Take 1 tablet (10 mg total) by mouth daily.   SOOLANTRA 1 % CREA Apply 1 application  topically daily as needed (rosacea).   spironolactone (ALDACTONE) 50 MG tablet Take 50 mg by mouth daily.   traMADol (ULTRAM) 50 MG tablet Take 1 tablet (50 mg total) by mouth every 12 (twelve) hours as needed for severe pain.   doxycycline (PERIOSTAT) 20 MG tablet  (Patient not taking: Reported on 09/14/2022)   Facility-Administered Medications Prior to Visit  Medication Dose Route Frequency Provider   0.9 %  sodium chloride infusion   Intravenous Once Earlie Server, MD   Review of Systems    Objective    BP 123/73 (BP Location: Right Arm, Patient Position: Sitting, Cuff Size: Normal)   Pulse 75   Temp 98.7 F (37.1 C) (Oral)   Resp 16   Wt 157 lb (71.2 kg)   SpO2 99%   BMI 28.26 kg/m   Physical Exam Vitals and nursing note reviewed.  Constitutional:      General: She is not in acute distress.    Appearance: Normal appearance. She is overweight. She is not ill-appearing, toxic-appearing or diaphoretic.  HENT:     Head: Normocephalic and atraumatic.     Right Ear: Tympanic membrane, ear  canal and external ear normal.     Left Ear: Tympanic membrane, ear canal and external ear normal.     Nose: Congestion present.     Right Sinus: Maxillary sinus tenderness present. No frontal sinus tenderness.     Left Sinus: Maxillary sinus tenderness present. No frontal sinus tenderness.     Mouth/Throat:     Mouth: Mucous membranes are moist.     Pharynx: Oropharynx is clear. No oropharyngeal exudate or posterior oropharyngeal erythema.  Eyes:     Extraocular Movements: Extraocular movements intact.     Conjunctiva/sclera: Conjunctivae normal.     Pupils: Pupils are equal, round, and reactive to light.   Cardiovascular:     Rate and Rhythm: Normal rate and regular rhythm.     Pulses: Normal pulses.     Heart sounds: Normal heart sounds. No murmur heard.    No friction rub. No gallop.  Pulmonary:     Effort: Pulmonary effort is normal. No respiratory distress.     Breath sounds: Normal breath sounds. No stridor. No wheezing, rhonchi or rales.  Chest:     Chest wall: No tenderness.  Musculoskeletal:        General: No swelling, tenderness, deformity or signs of injury. Normal range of motion.     Right lower leg: No edema.     Left lower leg: No edema.  Skin:    General: Skin is warm and dry.     Capillary Refill: Capillary refill takes less than 2 seconds.     Coloration: Skin is not jaundiced or pale.     Findings: No bruising, erythema, lesion or rash.  Neurological:     General: No focal deficit present.     Mental Status: She is alert and oriented to person, place, and time. Mental status is at baseline.     Cranial Nerves: No cranial nerve deficit.     Sensory: No sensory deficit.     Motor: No weakness.     Coordination: Coordination normal.  Psychiatric:        Mood and Affect: Mood normal.        Behavior: Behavior normal.        Thought Content: Thought content normal.        Judgment: Judgment normal.      No results found for any visits on 09/22/22.  Assessment & Plan     Problem List Items Addressed This Visit       Respiratory   Acute non-recurrent maxillary sinusitis - Primary    Acute, self limiting Pt notes exposure to sick family member over Christmas holiday Symptoms present and continue from 09/15/22 to current One day of fever Encouraged to use OTC Zyrtec and Flonase to assist Abx provided. RTC if needed. Continue APAP if fevering or throat lozenges to assist with scratchy feeling from PND      Relevant Medications   azithromycin (ZITHROMAX) 500 MG tablet   Return if symptoms worsen or fail to improve.     Vonna Kotyk, FNP, have  reviewed all documentation for this visit. The documentation on 09/22/22 for the exam, diagnosis, procedures, and orders are all accurate and complete.  Gwyneth Sprout, [place] [phone] (phone) [phone] (fax)  [place]

## 2022-10-10 ENCOUNTER — Other Ambulatory Visit: Payer: Self-pay | Admitting: Family Medicine

## 2022-10-17 ENCOUNTER — Other Ambulatory Visit: Payer: Self-pay | Admitting: Family Medicine

## 2022-10-17 DIAGNOSIS — I10 Essential (primary) hypertension: Secondary | ICD-10-CM

## 2022-11-01 ENCOUNTER — Other Ambulatory Visit: Payer: Self-pay | Admitting: Family Medicine

## 2022-12-08 ENCOUNTER — Telehealth: Payer: Self-pay | Admitting: Family Medicine

## 2022-12-08 NOTE — Telephone Encounter (Signed)
Pt is calling to reschedule AWV. Please advise CB- [phone]

## 2022-12-14 ENCOUNTER — Other Ambulatory Visit: Payer: Self-pay | Admitting: Family Medicine

## 2022-12-19 ENCOUNTER — Other Ambulatory Visit: Payer: Self-pay | Admitting: Family Medicine

## 2022-12-19 MED ORDER — OMEPRAZOLE 40 MG PO CPDR: 40.0000 mg | DELAYED_RELEASE_CAPSULE | Freq: Every day | ORAL | 1 refills | Status: DC

## 2023-01-10 ENCOUNTER — Telehealth: Payer: Self-pay

## 2023-01-11 NOTE — Telephone Encounter (Signed)
Pt calling need a refill of Lotrisone cream and Vesicare, Dr Tiburcio Pea has prescribed this in the past. Can I refill it?

## 2023-01-17 ENCOUNTER — Other Ambulatory Visit: Payer: Self-pay

## 2023-01-17 DIAGNOSIS — N3281 Overactive bladder: Secondary | ICD-10-CM

## 2023-01-17 MED ORDER — SOLIFENACIN SUCCINATE 10 MG PO TABS: 10.0000 mg | ORAL_TABLET | Freq: Every day | ORAL | 3 refills | Status: DC

## 2023-01-17 MED ORDER — CLOTRIMAZOLE-BETAMETHASONE 1-0.05 % EX CREA: 1.0000 | TOPICAL_CREAM | Freq: Two times a day (BID) | CUTANEOUS | 6 refills | Status: DC

## 2023-01-17 NOTE — Telephone Encounter (Signed)
Pt aware rx refilled. 

## 2023-01-20 ENCOUNTER — Ambulatory Visit: Payer: Medicare PPO | Admitting: Radiation Oncology

## 2023-01-26 ENCOUNTER — Ambulatory Visit
Admission: RE | Admit: 2023-01-26 | Discharge: 2023-01-26 | Disposition: A | Discharge status: Home / Self Care | Payer: Medicare PPO | Source: Ambulatory Visit | Attending: Radiation Oncology | Admitting: Radiation Oncology

## 2023-01-26 ENCOUNTER — Encounter: Payer: Self-pay | Admitting: Radiation Oncology

## 2023-01-26 VITALS — BP 132/71 | HR 77 | Temp 97.8°F | Resp 20 | Wt 156.0 lb

## 2023-01-26 DIAGNOSIS — Z79811 Long term (current) use of aromatase inhibitors: Secondary | ICD-10-CM | POA: Insufficient documentation

## 2023-01-26 DIAGNOSIS — Z923 Personal history of irradiation: Secondary | ICD-10-CM | POA: Diagnosis not present

## 2023-01-26 DIAGNOSIS — D0512 Intraductal carcinoma in situ of left breast: Secondary | ICD-10-CM

## 2023-01-26 DIAGNOSIS — Z170 Estrogen receptor positive status [ER+]: Secondary | ICD-10-CM | POA: Insufficient documentation

## 2023-01-26 NOTE — Progress Notes (Signed)
Radiation Oncology Follow up Note  Name: Claire Kane   Date:   01/26/2023 MRN:  [id number] DOB: 1941-04-29    This 82 y.o. 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  Seen today in routine follow-up she is doing well.  She specifically denies breast tenderness cough or bone pain..  She had mammograms back in December which I reviewed showed a new lesion near her left lumpectomy site of which biopsy was performed and was benign.  She is currently on Aromasin tolerating it well without side effect.  COMPLICATIONS OF TREATMENT: none  FOLLOW UP COMPLIANCE: keeps appointments   PHYSICAL EXAM:  BP 132/71   Pulse 77   Temp 97.8 F (36.6 C)   Resp 20   Wt 156 lb (70.8 kg)   SpO2 99%   BMI 28.08 kg/m  Lungs are clear to A&P cardiac examination essentially unremarkable with regular rate and rhythm. No dominant mass or nodularity is noted in either breast in 2 positions examined. Incision is well-healed. No axillary or supraclavicular adenopathy is appreciated. Cosmetic result is excellent.  Well-developed well-nourished patient in NAD. HEENT reveals PERLA, EOMI, discs not visualized.  Oral cavity is clear. No oral mucosal lesions are identified. Neck is clear without evidence of cervical or supraclavicular adenopathy. Lungs are clear to A&P. Cardiac examination is essentially unremarkable with regular rate and rhythm without murmur rub or thrill. Abdomen is benign with no organomegaly or masses noted. Motor sensory and DTR levels are equal and symmetric in the upper and lower extremities. Cranial nerves II through XII are grossly intact. Proprioception is intact. No peripheral adenopathy or  edema is identified. No motor or sensory levels are noted. Crude visual fields are within normal range.  RADIOLOGY RESULTS: Mammograms reviewed compatible with above-stated findings  PLAN: Present time patient is now out over 2 years status post whole breast radiation for ductal carcinoma in situ.  She continues follow-up care with surgeon as well as medical oncology.  I am going to turn follow-up care over to them.  I be happy to reevaluate the patient in time should that be indicated.  Patient is to call with any concerns.  I would like to take this opportunity to thank you for allowing me to participate in the care of your patient.Carmina Miller, MD

## 2023-02-01 ENCOUNTER — Ambulatory Visit (INDEPENDENT_AMBULATORY_CARE_PROVIDER_SITE_OTHER): Payer: Medicare PPO

## 2023-02-01 VITALS — Ht 62.5 in | Wt 156.0 lb

## 2023-02-01 DIAGNOSIS — Z0000 Encounter for general adult medical examination without abnormal findings: Secondary | ICD-10-CM | POA: Diagnosis not present

## 2023-02-01 NOTE — Patient Instructions (Signed)
Claire Kane , Thank you for taking time to come for your Medicare Wellness Visit. I appreciate your ongoing commitment to your health goals. Please review the following plan we discussed and let me know if I can assist you in the future.   These are the goals we discussed:  Goals      DIET - EAT MORE FRUITS AND VEGETABLES     DIET - INCREASE WATER INTAKE     Recommend increasing water intake to 4-6 glasses a day and decreasing soda and tea intake.      Exercise      Recommend increasing exercise by walking 4 days a week for 25 minutes.      LIFESTYLE - DECREASE FALLS RISK     Recommend to remove any items from the home that may cause slips or trips.        This is a list of the screening recommended for you and due dates:  Health Maintenance  Topic Date Due   Zoster (Shingles) Vaccine (1 of 2) Never done   COVID-19 Vaccine (6 - 2023-24 season) 05/20/2022   Flu Shot  04/20/2023   Medicare Annual Wellness Visit  02/01/2024   DTaP/Tdap/Td vaccine (4 - Td or Tdap) 04/22/2024   Pneumonia Vaccine  Completed   DEXA scan (bone density measurement)  Completed   HPV Vaccine  Aged Out    Advanced directives: yes  Conditions/risks identified: low falls risk  Next appointment: Follow up in one year for your annual wellness visit 02/06/2024 @ 1pm  telephone   Preventive Care 65 Years and Older, Female Preventive care refers to lifestyle choices and visits with your health care provider that can promote health and wellness. What does preventive care include? A yearly physical exam. This is also called an annual well check. Dental exams once or twice a year. Routine eye exams. Ask your health care provider how often you should have your eyes checked. Personal lifestyle choices, including: Daily care of your teeth and gums. Regular physical activity. Eating a healthy diet. Avoiding tobacco and drug use. Limiting alcohol use. Practicing safe sex. Taking low-dose aspirin every  day. Taking vitamin and mineral supplements as recommended by your health care provider. What happens during an annual well check? The services and screenings done by your health care provider during your annual well check will depend on your age, overall health, lifestyle risk factors, and family history of disease. Counseling  Your health care provider may ask you questions about your: Alcohol use. Tobacco use. Drug use. Emotional well-being. Home and relationship well-being. Sexual activity. Eating habits. History of falls. Memory and ability to understand (cognition). Work and work Astronomer. Reproductive health. Screening  You may have the following tests or measurements: Height, weight, and BMI. Blood pressure. Lipid and cholesterol levels. These may be checked every 5 years, or more frequently if you are over 49 years old. Skin check. Lung cancer screening. You may have this screening every year starting at age 6 if you have a 30-pack-year history of smoking and currently smoke or have quit within the past 15 years. Fecal occult blood test (FOBT) of the stool. You may have this test every year starting at age 32. Flexible sigmoidoscopy or colonoscopy. You may have a sigmoidoscopy every 5 years or a colonoscopy every 10 years starting at age 28. Hepatitis C blood test. Hepatitis B blood test. Sexually transmitted disease (STD) testing. Diabetes screening. This is done by checking your blood sugar (glucose) after you  have not eaten for a while (fasting). You may have this done every 1-3 years. Bone density scan. This is done to screen for osteoporosis. You may have this done starting at age 78. Mammogram. This may be done every 1-2 years. Talk to your health care provider about how often you should have regular mammograms. Talk with your health care provider about your test results, treatment options, and if necessary, the need for more tests. Vaccines  Your health care  provider may recommend certain vaccines, such as: Influenza vaccine. This is recommended every year. Tetanus, diphtheria, and acellular pertussis (Tdap, Td) vaccine. You may need a Td booster every 10 years. Zoster vaccine. You may need this after age 58. Pneumococcal 13-valent conjugate (PCV13) vaccine. One dose is recommended after age 67. Pneumococcal polysaccharide (PPSV23) vaccine. One dose is recommended after age 55. Talk to your health care provider about which screenings and vaccines you need and how often you need them. This information is not intended to replace advice given to you by your health care provider. Make sure you discuss any questions you have with your health care provider. Document Released: 10/02/2015 Document Revised: 05/25/2016 Document Reviewed: 07/07/2015 Elsevier Interactive Patient Education  2017 [place] Prevention in the Home Falls can cause injuries. They can happen to people of all ages. There are many things you can do to make your home safe and to help prevent falls. What can I do on the outside of my home? Regularly fix the edges of walkways and driveways and fix any cracks. Remove anything that might make you trip as you walk through a door, such as a raised step or threshold. Trim any bushes or trees on the path to your home. Use bright outdoor lighting. Clear any walking paths of anything that might make someone trip, such as rocks or tools. Regularly check to see if handrails are loose or broken. Make sure that both sides of any steps have handrails. Any raised decks and porches should have guardrails on the edges. Have any leaves, snow, or ice cleared regularly. Use sand or salt on walking paths during winter. Clean up any spills in your garage right away. This includes oil or grease spills. What can I do in the bathroom? Use night lights. Install grab bars by the toilet and in the tub and shower. Do not use towel bars as grab  bars. Use non-skid mats or decals in the tub or shower. If you need to sit down in the shower, use a plastic, non-slip stool. Keep the floor dry. Clean up any water that spills on the floor as soon as it happens. Remove soap buildup in the tub or shower regularly. Attach bath mats securely with double-sided non-slip rug tape. Do not have throw rugs and other things on the floor that can make you trip. What can I do in the bedroom? Use night lights. Make sure that you have a light by your bed that is easy to reach. Do not use any sheets or blankets that are too big for your bed. They should not hang down onto the floor. Have a firm chair that has side arms. You can use this for support while you get dressed. Do not have throw rugs and other things on the floor that can make you trip. What can I do in the kitchen? Clean up any spills right away. Avoid walking on wet floors. Keep items that you use a lot in easy-to-reach places. If you need  to reach something above you, use a strong step stool that has a grab bar. Keep electrical cords out of the way. Do not use floor polish or wax that makes floors slippery. If you must use wax, use non-skid floor wax. Do not have throw rugs and other things on the floor that can make you trip. What can I do with my stairs? Do not leave any items on the stairs. Make sure that there are handrails on both sides of the stairs and use them. Fix handrails that are broken or loose. Make sure that handrails are as long as the stairways. Check any carpeting to make sure that it is firmly attached to the stairs. Fix any carpet that is loose or worn. Avoid having throw rugs at the top or bottom of the stairs. If you do have throw rugs, attach them to the floor with carpet tape. Make sure that you have a light switch at the top of the stairs and the bottom of the stairs. If you do not have them, ask someone to add them for you. What else can I do to help prevent  falls? Wear shoes that: Do not have high heels. Have rubber bottoms. Are comfortable and fit you well. Are closed at the toe. Do not wear sandals. If you use a stepladder: Make sure that it is fully opened. Do not climb a closed stepladder. Make sure that both sides of the stepladder are locked into place. Ask someone to hold it for you, if possible. Clearly mark and make sure that you can see: Any grab bars or handrails. First and last steps. Where the edge of each step is. Use tools that help you move around (mobility aids) if they are needed. These include: Canes. Walkers. Scooters. Crutches. Turn on the lights when you go into a dark area. Replace any light bulbs as soon as they burn out. Set up your furniture so you have a clear path. Avoid moving your furniture around. If any of your floors are uneven, fix them. If there are any pets around you, be aware of where they are. Review your medicines with your doctor. Some medicines can make you feel dizzy. This can increase your chance of falling. Ask your doctor what other things that you can do to help prevent falls. This information is not intended to replace advice given to you by your health care provider. Make sure you discuss any questions you have with your health care provider. Document Released: 07/02/2009 Document Revised: 02/11/2016 Document Reviewed: 10/10/2014 Elsevier Interactive Patient Education  2017 [employer].

## 2023-02-01 NOTE — Progress Notes (Signed)
I connected with  Claire Kane on 02/01/23 by a audio enabled telemedicine application and verified that I am speaking with the correct person using two identifiers.  Patient Location: Home  Provider Location: Office/Clinic  I discussed the limitations of evaluation and management by telemedicine. The patient expressed understanding and agreed to proceed.  Subjective:   Claire Kane is a 82 y.o. female who presents for Medicare Annual (Subsequent) preventive examination.  Review of Systems    Cardiac Risk Factors include: advanced age (>21men, >87 women);dyslipidemia;sedentary lifestyle    Objective:    Today's Vitals   02/01/23 1303  Weight: 156 lb (70.8 kg)  Height: 5' 2.5" (1.588 m)  PainSc: 0-No pain   Body mass index is 28.08 kg/m.     02/01/2023    1:21 PM 01/26/2023    9:57 AM 09/14/2022    1:00 PM 03/15/2022    2:05 PM 12/09/2021    2:02 PM 12/06/2021    1:39 PM 09/01/2021   11:13 AM  Advanced Directives  Does Patient Have a Medical Advance Directive? Yes Yes Yes Yes Yes No Yes  Type of Estate agent of [place];Living will Living will;Healthcare Power of Attorney Living will;Healthcare Power of Attorney Living will;Healthcare Power of Attorney Living will;Healthcare Power of [employer] Power of [place];Living will  Does patient want to make changes to medical advance directive?  Yes (ED - Information included in AVS)       Copy of Healthcare Power of Attorney in Chart?   No - copy requested  No - copy requested  No - copy requested  Would patient like information on creating a medical advance directive?      No - Patient declined     Current Medications (verified) Outpatient Encounter Medications as of 02/01/2023  Medication Sig   amLODipine (NORVASC) 5 MG tablet TAKE 1 TABLET (5 MG TOTAL) BY MOUTH DAILY.   atorvastatin (LIPITOR) 10 MG tablet TAKE 1 TABLET EVERY DAY   azithromycin (ZITHROMAX) 500 MG tablet Take 1 tablet (500 mg  total) by mouth daily.   calcium citrate (CALCITRATE - DOSED IN MG ELEMENTAL CALCIUM) 950 MG tablet Take 200 mg of elemental calcium by mouth 2 (two) times daily.    clotrimazole-betamethasone (LOTRISONE) cream Apply 1 Application topically 2 (two) times daily.   cycloSPORINE (RESTASIS) 0.05 % ophthalmic emulsion Place 1 drop into both eyes 2 (two) times daily.   docusate sodium (COLACE) 100 MG capsule Take 100 mg by mouth daily as needed for mild constipation.   doxycycline (PERIOSTAT) 20 MG tablet    exemestane (AROMASIN) 25 MG tablet TAKE 1 TABLET EVERY DAY AFTER BREAKFAST   ezetimibe (ZETIA) 10 MG tablet TAKE 1 TABLET (10 MG TOTAL) BY MOUTH DAILY.   Glucosamine 750 MG TABS Take 750 mg by mouth daily.   Multiple Vitamins-Minerals (MULTIVITAMIN WITH MINERALS) tablet Take 1 tablet by mouth daily.   omeprazole (PRILOSEC) 40 MG capsule Take 1 capsule (40 mg total) by mouth daily.   psyllium (REGULOID) 0.52 G capsule Take 2 capsules by mouth daily.   simethicone (MYLICON) 80 MG chewable tablet Chew 80 mg by mouth every 6 (six) hours as needed for flatulence.   solifenacin (VESICARE) 10 MG tablet Take 1 tablet (10 mg total) by mouth daily.   SOOLANTRA 1 % CREA Apply 1 application  topically daily as needed (rosacea).   spironolactone (ALDACTONE) 50 MG tablet Take 50 mg by mouth daily.   traMADol (ULTRAM) 50 MG tablet  TAKE 1 TABLET EVERY 12 HOURS AS NEEDED FOR SEVERE PAIN   Glycerin-Polysorbate 80 (REFRESH DRY EYE THERAPY OP) Place 1 drop into both eyes 2 (two) times daily. 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     Breast cancer (HCC) 09/2020   left breast   Cataract    GERD (gastroesophageal reflux disease)    Heart murmur 2001   Hiatal hernia    High cholesterol    Hypertension    patient denies anyone telling her that she has htn   Personal history of radiation therapy 2002, 2022   BREAST CA   Rosacea    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 complications    Past Surgical History:  Procedure Laterality Date   ABDOMINAL HYSTERECTOMY     BLEPHAROPLASTY     BREAST BIOPSY Left 09/15/2020   affirm bx, x marker, DCIS   BREAST BIOPSY Left 09/07/2022   Left Breast Stereo Bx X clip- path pending   BREAST BIOPSY Left 09/07/2022   MM LT BREAST BX W LOC DEV 1ST LESION IMAGE BX SPEC STEREO GUIDE 09/07/2022 [facility]   BREAST CYST ASPIRATION Left 2003   BREAST EXCISIONAL BIOPSY Right 2002   POS   BREAST LUMPECTOMY Right 2002   w/ radiation   BREAST LUMPECTOMY Left 10/07/2020   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   BREAST LUMPECTOMY Left    re-excision   BREAST LUMPECTOMY WITH NEEDLE LOCALIZATION Left 10/07/2020   Procedure: BREAST LUMPECTOMY WITH NEEDLE LOCALIZATION;  Surgeon: Earline Mayotte, MD;  Location: [facility];  Service: General;  Laterality: Left;   BREAST SURGERY Right 2002   lumpectomy   CATARACT EXTRACTION W/ INTRAOCULAR LENS  IMPLANT, BILATERAL     CHOLECYSTECTOMY     COLONOSCOPY  2013   COLONOSCOPY WITH PROPOFOL N/A 12/08/2015   Procedure: COLONOSCOPY WITH PROPOFOL;  Surgeon: Kieth Brightly, MD;  Location: [facility];  Service: Endoscopy;  Laterality: N/A;   COLONOSCOPY WITH PROPOFOL N/A 12/06/2017   Procedure: COLONOSCOPY WITH PROPOFOL;  Surgeon: Wyline Mood, MD;  Location: [facility] ENDOSCOPY;  Service: Gastroenterology;  Laterality: N/A;   COLONOSCOPY WITH PROPOFOL N/A 12/22/2017   Procedure: COLONOSCOPY WITH PROPOFOL;  Surgeon: Wyline Mood, MD;  Location: [facility] ENDOSCOPY;   Service: Gastroenterology;  Laterality: N/A;   ESOPHAGOGASTRODUODENOSCOPY (EGD) WITH PROPOFOL N/A 12/08/2015   Procedure: ESOPHAGOGASTRODUODENOSCOPY (EGD) WITH PROPOFOL;  Surgeon: Kieth Brightly, MD;  Location: [facility] ENDOSCOPY;  Service: Endoscopy;  Laterality: N/A;   ESOPHAGOGASTRODUODENOSCOPY (EGD) WITH PROPOFOL N/A 12/06/2017   Procedure: ESOPHAGOGASTRODUODENOSCOPY (EGD) WITH PROPOFOL;  Surgeon: Wyline Mood, MD;  Location: [facility] END[facility]COPY;  Service: Gastroenterology;  Laterality: N/A;   ESOPHAGOGASTRODUODENOSCOPY (EGD) WITH PROPOFOL N/A 12/22/2017   Procedure: ESOPHAGOGASTRODUODENOSCOPY (EGD) WITH PROPOFOL;  Surgeon: Wyline Mood, MD;  Location: [facility] ENDOSCOPY;  Service: Gastroenterology;  Laterality: N/A;   EYE SURGERY Right 2014   EYE SURGERY Left 2014   GIVENS CAPSULE STUDY N/A 01/23/2018   Procedure: GIVENS CAPSULE STUDY;  Surgeon: Wyline Mood, MD;  Location: [facility] ENDOSCOPY;  Service: Gastroenterology;  Laterality: N/A;   iron infusion     had 5 iron infusions for anemia   post radiation therapy Right    right breast cancer 2002   RE-EXCISION OF BREAST CANCER,SUPERIOR MARGINS Left 11/09/2020   Procedure: RE-EXCISION OF BREAST CANCER,SUPERIOR MARGINS;  Surgeon: Earline Mayotte, MD;  Location: [facility];  Service: General;  Laterality: Left;   Stab pheblectomy   2010   vein closure procedure Bilateral 2008   Family History  Problem Relation Age of Onset   Heart attack Mother    Heart attack Father    Heart attack Sister    Stroke Sister    Heart attack Brother    Other Brother        cerebral hemorrhage, sepsis   Breast cancer Cousin 50       breast/maternal   Cancer Cousin 60       breast/maternal   Breast cancer Cousin    Breast cancer Maternal Aunt 68   Breast cancer Other 48       maternal neice   Social History   Socioeconomic History   Marital status: Married    Spouse name: Peyton Najjar   Number of children: 2   Years of education: Not on file   Highest  education level: Bachelor's degree (e.g., BA, AB, BS)  Occupational History   Occupation: Runner, broadcasting/film/video    Comment: retired  Tobacco Use   Smoking status: Never   Smokeless tobacco: Never  Vaping Use   Vaping Use: Never used  Substance and Sexual Activity   Alcohol use: No   Drug use: No   Sexual activity: Never  Other Topics Concern   Not on file  Social History Narrative   Patient lives with husband. She feels safe in her home.   Social Determinants of Health   Financial Resource Strain: Low Risk  (02/01/2023)   Overall Financial Resource Strain (CARDIA)    Difficulty of Paying Living Expenses: Not hard at all  Food Insecurity: No Food Insecurity (02/01/2023)   Hunger Vital Sign    Worried About Running Out of Food in the Last Year: Never true    Ran Out of Food in the Last Year: Never true  Transportation Needs: No Transportation Needs (02/01/2023)   PRAPARE - Administrator, Civil Service (Medical): No    Lack of Transportation (Non-Medical): No  Physical Activity: Inactive (02/01/2023)   Exercise Vital Sign    Days of Exercise per Week: 0 days    Minutes of Exercise per Session: 0 min  Stress: No Stress Concern Present (02/01/2023)   [employer] of Occupational Health - Occupational Stress Questionnaire    Feeling of Stress : Not at all  Social Connections: Moderately Isolated (02/01/2023)   Social Connection and Isolation Panel [NHANES]    Frequency of Communication with Friends and Family: More than three times a week    Frequency of Social Gatherings with Friends and Family: Once a week    Attends Religious Services: Never    Database administrator or Organizations: No    Attends Engineer, structural: Never    Marital Status: Married    Tobacco Counseling Counseling given: Not Answered   Clinical Intake:  Pre-visit preparation completed: Yes  Pain : No/denies pain Pain Score: 0-No pain     BMI - recorded: 28.08 Nutritional Status:  BMI 25 -29 Overweight Nutritional Risks: Nausea/ vomitting/ diarrhea (nausea from hiatal hernia if  over eats) Diabetes: No  How often do you need to have someone help you when you read instructions, pamphlets, or other written materials from your doctor or pharmacy?: 1 - Never  Diabetic?no  Interpreter Needed?: No  Comments: lives with husband Information entered by :: B.Darleen Moffitt,LPN   Activities of Daily Living    02/01/2023    1:21 PM 09/01/2022   10:46 AM  In your present state of health, do you have any difficulty performing the following activities:  Hearing? 0 0  Vision? 0 0  Difficulty concentrating or making decisions? 0 1  Walking or climbing stairs? 0 1  Dressing or bathing? 0 0  Doing errands, shopping? 0 1  Preparing Food and eating ? N   Using the Toilet? N   In the past six months, have you accidently leaked urine? N   Do you have problems with loss of bowel control? N   Managing your Medications? N   Managing your Finances? N   Housekeeping or managing your Housekeeping? N     Patient Care Team: Ronnald Ramp, MD as PCP - General (Family Medicine) Dingeldein, Viviann Spare, MD as Consulting Physician (Ophthalmology) Nadara Mustard, MD as Referring Physician (Obstetrics and Gynecology) [name], MD as Consulting Physician (Oncology) Ruby Cola, MD as Referring Physician (Internal Medicine) Marlowe Sax, RN as Case Manager (General Practice) Kemper Durie, RN as [facility] Care Management  Indicate any recent Medical Services you may have received from other than [facility] providers in the past year (date may be approximate).     Assessment:   This is a routine wellness examination for Monika.  Hearing/Vision screen Hearing Screening - Comments:: Adequate hearing Vision Screening - Comments:: Adequate vision after cataracts removed  Dr Dellie Burns  Dietary issues and exercise activities discussed: Current Exercise Habits: The  patient does not participate in regular exercise at present, Exercise limited by: cardiac condition(s);orthopedic condition(s)   Goals Addressed             This Visit's Progress    DIET - EAT MORE FRUITS AND VEGETABLES   On track    DIET - INCREASE WATER INTAKE   On track    Recommend increasing water intake to 4-6 glasses a day and decreasing soda and tea intake.      Exercise    Not on track    Recommend increasing exercise by walking 4 days a week for 25 minutes.      LIFESTYLE - DECREASE FALLS RISK   Not on track    Recommend to remove any items from the home that may cause slips or trips.       Depression Screen    02/01/2023    1:16 PM 09/01/2022   10:45 AM 06/14/2022    1:14 PM 01/13/2022    9:15 AM 12/06/2021    1:37 PM 07/21/2021    1:23 PM 04/08/2021   11:00 AM  PHQ 2/9 Scores  PHQ - 2 Score 0 0 0 0 0 0 0  PHQ- 9 Score  2    2 4     Fall Risk    02/01/2023    1:06 PM 09/01/2022   10:44 AM 01/13/2022    9:15 AM 12/06/2021    1:39 PM 07/21/2021    1:22 PM  Fall Risk   Falls in the past year? 0 1 0 1 1  Number falls in past yr: 0 1 0 0 0  Injury with Fall? 0 0 0  0 1  Risk for fall due to : No Fall Risks Impaired balance/gait  History of fall(s) History of fall(s)  Follow up Education provided;Falls prevention discussed Falls evaluation completed  Falls prevention discussed     FALL RISK PREVENTION PERTAINING TO THE HOME:  Any stairs in or around the home? Yes  If so, are there any without handrails? Yes  Home free of loose throw rugs in walkways, pet beds, electrical cords, etc? Yes  Adequate lighting in your home to reduce risk of falls? Yes   ASSISTIVE DEVICES UTILIZED TO PREVENT FALLS:  Life alert? No  Use of a cane, walker or w/c? No  Grab bars in the bathroom? Yes  Shower chair or bench in shower? Yes do not use Elevated toilet seat or a handicapped toilet? Yes   Cognitive Function:        02/01/2023    1:23 PM 06/11/2019   11:37 AM 02/20/2018    10:54 AM 12/13/2016    3:25 PM  6CIT Screen  What Year? 0 points 0 points 0 points 0 points  What month? 0 points 0 points 0 points 0 points  What time? 0 points 0 points 0 points 0 points  Count back from 20 0 points 0 points 0 points 0 points  Months in reverse 0 points 0 points 0 points 0 points  Repeat phrase 0 points 0 points 0 points 0 points  Total Score 0 points 0 points 0 points 0 points    Immunizations Immunization History  Administered Date(s) Administered   Fluad Quad(high Dose 65+) 07/23/2020   Influenza, High Dose Seasonal PF 05/28/2015, 07/05/2017, 06/10/2019   Influenza,inj,Quad PF,6+ Mos 06/27/2018   Influenza-Unspecified 05/09/2016   PFIZER Comirnaty(Gray Top)Covid-19 Tri-Sucrose Vaccine 01/12/2021, 06/13/2021   PFIZER(Purple Top)SARS-COV-2 Vaccination 10/08/2019, 11/02/2019, 06/25/2020   Pneumococcal Conjugate-13 04/22/2014   Pneumococcal Polysaccharide-23 03/15/2011   Td 09/06/2007, 12/30/2012   Tdap 04/22/2014   Zoster, Live 06/27/2011    TDAP status: Up to date  Flu Vaccine status: Up to date  Pneumococcal vaccine status: Up to date  Covid-19 vaccine status: Completed vaccines  Qualifies for Shingles Vaccine? Yes   Zostavax completed No   Shingrix Completed?: No.    Education has been provided regarding the importance of this vaccine. Patient has been advised to call insurance company to determine out of pocket expense if they have not yet received this vaccine. Advised may also receive vaccine at local pharmacy or Health Dept. Verbalized acceptance and understanding.  Screening Tests Health Maintenance  Topic Date Due   Zoster Vaccines- Shingrix (1 of 2) Never done   COVID-19 Vaccine (6 - 2023-24 season) 05/20/2022   INFLUENZA VACCINE  04/20/2023   Medicare Annual Wellness (AWV)  02/01/2024   DTaP/Tdap/Td (4 - Td or Tdap) 04/22/2024   Pneumonia Vaccine 59+ Years old  Completed   DEXA SCAN  Completed   HPV VACCINES  Aged Out    Health  Maintenance  Health Maintenance Due  Topic Date Due   Zoster Vaccines- Shingrix (1 of 2) Never done   COVID-19 Vaccine (6 - 2023-24 season) 05/20/2022    Colorectal cancer screening: No longer required.   Mammogram status: No longer required due to age.  Bone Density status: Completed yes. Results reflect: Bone density results: OSTEOPENIA. Repeat every 3 years.  Lung Cancer Screening: (Low Dose CT Chest recommended if Age 46-80 years, 30 pack-year currently smoking OR have quit w/in 15years.) does not qualify.   Lung Cancer Screening Referral: no  Additional Screening:  Hepatitis C Screening: does not qualify; Completed yes  Vision Screening: Recommended annual ophthalmology exams for early detection of glaucoma and other disorders of the eye. Is the patient up to date with their annual eye exam?  No  Who is the provider or what is the name of the office in which the patient attends annual eye exams? Dr Dellie Burns If pt is not established with a provider, would they like to be referred to a provider to establish care? No .   Dental Screening: Recommended annual dental exams for proper oral hygiene  Community Resource Referral / Chronic Care Management: CRR required this visit?  No   CCM required this visit?  No     Plan:     I have personally reviewed and noted the following in the patient's chart:   Medical and social history Use of alcohol, tobacco or illicit drugs  Current medications and supplements including opioid prescriptions. Patient is not currently taking opioid prescriptions. Functional ability and status Nutritional status Physical activity Advanced directives List of other physicians Hospitalizations, surgeries, and ER visits in previous 12 months Vitals Screenings to include cognitive, depression, and falls Referrals and appointments  In addition, I have reviewed and discussed with patient certain preventive protocols, quality metrics, and best  practice recommendations. A written personalized care plan for preventive services as well as general preventive health recommendations were provided to patient.    Sue Lush, LPN   05/17/5620   Nurse Notes: The patient states she is doing well and has no concerns or questions at this time.

## 2023-02-24 ENCOUNTER — Other Ambulatory Visit: Payer: Self-pay | Admitting: Family Medicine

## 2023-02-24 NOTE — Telephone Encounter (Signed)
Requested medications are due for refill today.  yes  Requested medications are on the active medications list.  yes  Last refill. 12/14/2022 #60 0 rf  Future visit scheduled.   yes  Notes to clinic.  Refill not delegated.    Requested Prescriptions  Pending Prescriptions Disp Refills   traMADol (ULTRAM) 50 MG tablet [Pharmacy Med Name: TRAMADOL HYDROCHLORIDE 50 MG Tablet] 60 tablet     Sig: TAKE 1 TABLET EVERY 12 HOURS AS NEEDED FOR SEVERE PAIN     Not Delegated - Analgesics:  Opioid Agonists Failed - 02/24/2023  4:45 PM      Failed - This refill cannot be delegated      Failed - Urine Drug Screen completed in last 360 days      Passed - Valid encounter within last 3 months    Recent Outpatient Visits           5 months ago Acute non-recurrent maxillary sinusitis   [facility] Jacky Kindle, FNP   5 months ago Other hyperlipidemia   [facility] Simmons-Robinson, Drake, MD   1 year ago Gastroesophageal reflux disease, unspecified whether esophagitis present   [facility] Bosie Clos, MD   1 year ago Essential hypertension   [facility] Bosie Clos, MD   1 year ago Tommi Rumps Quervain's disease (tenosynovitis)   [facility] Alfredia Ferguson, PA-C       Future Appointments             In 1 week Simmons-Robinson, Tawanna Cooler, MD [facility]

## 2023-02-28 NOTE — Telephone Encounter (Signed)
Please advise 

## 2023-03-01 ENCOUNTER — Other Ambulatory Visit: Payer: Medicare PPO

## 2023-03-03 ENCOUNTER — Encounter: Payer: Self-pay | Admitting: Family Medicine

## 2023-03-03 ENCOUNTER — Ambulatory Visit: Payer: Medicare PPO | Admitting: Family Medicine

## 2023-03-03 VITALS — BP 134/72 | HR 71 | Wt 159.0 lb

## 2023-03-03 DIAGNOSIS — E7849 Other hyperlipidemia: Secondary | ICD-10-CM | POA: Diagnosis not present

## 2023-03-03 DIAGNOSIS — K30 Functional dyspepsia: Secondary | ICD-10-CM

## 2023-03-03 DIAGNOSIS — I10 Essential (primary) hypertension: Secondary | ICD-10-CM

## 2023-03-03 DIAGNOSIS — M8580 Other specified disorders of bone density and structure, unspecified site: Secondary | ICD-10-CM | POA: Diagnosis not present

## 2023-03-03 DIAGNOSIS — G629 Polyneuropathy, unspecified: Secondary | ICD-10-CM | POA: Insufficient documentation

## 2023-03-03 MED ORDER — GABAPENTIN 100 MG PO CAPS: 100.0000 mg | ORAL_CAPSULE | Freq: Every day | ORAL | 1 refills | Status: DC

## 2023-03-03 NOTE — Assessment & Plan Note (Signed)
Chronic Patient will continue omeprazole 20 mg

## 2023-03-03 NOTE — Assessment & Plan Note (Signed)
Chronic Follows with endocrinology She will continue Prolia injections as previously scheduled Patient states she has upcoming DEXA scan Continue current regimen

## 2023-03-03 NOTE — Assessment & Plan Note (Signed)
Chronic Will repeat lipid panel Continue atorvastatin 10 mg daily Continue Zetia 10 mg daily

## 2023-03-03 NOTE — Assessment & Plan Note (Signed)
Controlled BP at goal Continue amlodipine 5mg daily  No medications changes today   

## 2023-03-03 NOTE — Assessment & Plan Note (Signed)
Chronic  Likely SE of medications, we also discussed potential nutritional deficiencies such as vitamin B12, thyroid abnormalities, electrolyte abnormalities and anemia Patient would like to hold on the CMP and CBC as she has upcoming appointment with hematology however she is agreeable to returning on future date for vitamin B12, TSH and free T4 &And magnesium

## 2023-03-03 NOTE — Progress Notes (Signed)
I,Sha'taria Tyson,acting as a Neurosurgeon for [employer], MD.,have documented all relevant documentation on the behalf of Ronnald Ramp, MD,as directed by  Ronnald Ramp, MD while in the presence of Ronnald Ramp, MD.   Established patient visit   Patient: Claire Kane   DOB: 1941/03/31   82 y.o. Female  MRN: [id number] Visit Date: 03/03/2023  Today's healthcare provider: Ronnald Ramp, MD   No chief complaint on file.  Subjective    HPI  Lipid/Cholesterol, Follow-up  Last lipid panel Other pertinent labs  Lab Results  Component Value Date   CHOL 122 09/01/2022   HDL 53 09/01/2022   LDLCALC 58 09/01/2022   TRIG 48 09/01/2022   CHOLHDL 2.3 09/01/2022   Lab Results  Component Value Date   ALT 20 09/14/2022   AST 21 09/14/2022   PLT 332 09/14/2022   TSH 2.110 06/10/2019     She was last seen for this 6 months ago.  Management since that visit includes continue current treatment. Symptoms: No chest pain Yes chest pressure/discomfort  No dyspnea Yes lower extremity edema  Yes numbness or tingling of extremity No orthopnea  No palpitations No paroxysmal nocturnal dyspnea  No speech difficulty No syncope   The ASCVD Risk score (Arnett DK, et al., 2019) failed to calculate for the following reasons:   The 2019 ASCVD risk score is only valid for ages 1 to 45  Hypertension, follow-up  BP Readings from Last 3 Encounters:  03/03/23 134/72  01/26/23 132/71  09/22/22 123/73   Wt Readings from Last 3 Encounters:  03/03/23 159 lb (72.1 kg)  02/01/23 156 lb (70.8 kg)  01/26/23 156 lb (70.8 kg)     She was last seen for hypertension 6 months ago.  BP at that visit was 132/71. Management since that visit includes continue amlodipine .  Outside blood pressures are 120-130/60-70. Symptoms: No chest pain No chest pressure  No palpitations No syncope  No dyspnea No orthopnea  No paroxysmal nocturnal dyspnea Yes  lower extremity edema   Pertinent labs Lab Results  Component Value Date   CHOL 122 09/01/2022   HDL 53 09/01/2022   LDLCALC 58 09/01/2022   TRIG 48 09/01/2022   CHOLHDL 2.3 09/01/2022   Lab Results  Component Value Date   NA 137 09/14/2022   K 4.3 09/14/2022   CREATININE 0.67 09/14/2022   GFRNONAA >60 09/14/2022   GLUCOSE 89 09/14/2022   TSH 2.110 06/10/2019     The ASCVD Risk score (Arnett DK, et al., 2019) failed to calculate for the following reasons:   The 2019 ASCVD risk score is only valid for ages 22 to 33  ---------------------------------------------------------------------------------------------------    Hair Changes: Reports some fatigue and constipation and thinning eye brows    Dyspepsia: Reports having need to burp, and bloating, increased doses of omeprazole with GI, states she has to walk around to burp due to the discomfort in her upper abdomen, she wants to know the SE of taking the PPI for long periods of time   Osteopenia: States that she has been taking Prolia injections due to reaction to Zometa infusion in the past, has DEXA scan coming up    Neuropathy in hands: Reports tingling and fingers going numb, sees heme/onc in July, states she is on the exemestane to prevent reoccurrence of breast cancer,  was told she had bilateral carpal tunnel and states that brace helps at night   Medications: Outpatient Medications Prior to Visit  Medication  Sig   amLODipine (NORVASC) 5 MG tablet TAKE 1 TABLET (5 MG TOTAL) BY MOUTH DAILY.   atorvastatin (LIPITOR) 10 MG tablet TAKE 1 TABLET EVERY DAY   calcium citrate (CALCITRATE - DOSED IN MG ELEMENTAL CALCIUM) 950 MG tablet Take 200 mg of elemental calcium by mouth 2 (two) times daily.    clotrimazole-betamethasone (LOTRISONE) cream Apply 1 Application topically 2 (two) times daily.   cycloSPORINE (RESTASIS) 0.05 % ophthalmic emulsion Place 1 drop into both eyes 2 (two) times daily.   docusate sodium (COLACE) 100 MG  capsule Take 100 mg by mouth daily as needed for mild constipation.   doxycycline (PERIOSTAT) 20 MG tablet    exemestane (AROMASIN) 25 MG tablet TAKE 1 TABLET EVERY DAY AFTER BREAKFAST   ezetimibe (ZETIA) 10 MG tablet TAKE 1 TABLET (10 MG TOTAL) BY MOUTH DAILY.   Glucosamine 750 MG TABS Take 750 mg by mouth daily.   Glycerin-Polysorbate 80 (REFRESH DRY EYE THERAPY OP) Place 1 drop into both eyes 2 (two) times daily.   Multiple Vitamins-Minerals (MULTIVITAMIN WITH MINERALS) tablet Take 1 tablet by mouth daily.   omeprazole (PRILOSEC) 40 MG capsule Take 1 capsule (40 mg total) by mouth daily.   psyllium (REGULOID) 0.52 G capsule Take 2 capsules by mouth daily.   simethicone (MYLICON) 80 MG chewable tablet Chew 80 mg by mouth every 6 (six) hours as needed for flatulence.   solifenacin (VESICARE) 10 MG tablet Take 1 tablet (10 mg total) by mouth daily.   SOOLANTRA 1 % CREA Apply 1 application  topically daily as needed (rosacea).   spironolactone (ALDACTONE) 50 MG tablet Take 50 mg by mouth daily.   traMADol (ULTRAM) 50 MG tablet TAKE 1 TABLET EVERY 12 HOURS AS NEEDED FOR SEVERE PAIN   [DISCONTINUED] azithromycin (ZITHROMAX) 500 MG tablet Take 1 tablet (500 mg total) by mouth daily. (Patient not taking: Reported on 03/03/2023)   Facility-Administered Medications Prior to Visit  Medication Dose Route Frequency Provider   0.9 %  sodium chloride infusion   Intravenous Once [name], MD    Review of Systems     Objective    BP 134/72   Pulse 71   Wt 159 lb (72.1 kg)   BMI 28.62 kg/m    Physical Exam Vitals reviewed.  Constitutional:      General: She is not in acute distress.    Appearance: Normal appearance. She is not ill-appearing, toxic-appearing or diaphoretic.  Eyes:     Conjunctiva/sclera: Conjunctivae normal.  Cardiovascular:     Rate and Rhythm: Normal rate and regular rhythm.     Pulses: Normal pulses.     Heart sounds: Normal heart sounds. No murmur heard.    No friction  rub. No gallop.  Pulmonary:     Effort: Pulmonary effort is normal. No respiratory distress.     Breath sounds: Normal breath sounds. No stridor. No wheezing, rhonchi or rales.  Abdominal:     General: Bowel sounds are normal. There is no distension.     Palpations: Abdomen is soft.     Tenderness: There is no abdominal tenderness.  Musculoskeletal:     Right lower leg: No edema.     Left lower leg: No edema.  Skin:    Findings: No erythema or rash.  Neurological:     Mental Status: She is alert and oriented to person, place, and time.     Cranial Nerves: Cranial nerves 2-12 are intact.     Comments: 5/5 bilateral grip  strength and interosseus muscle strength  Neurovascularly intact         No results found for any visits on 03/03/23.  Assessment & Plan     Problem List Items Addressed This Visit       Cardiovascular and Mediastinum   Essential hypertension    Controlled BP at goal Continue amlodipine 5mg  daily No medications changes today           Nervous and Auditory   Neuropathy    Chronic  Likely SE of medications, we also discussed potential nutritional deficiencies such as vitamin B12, thyroid abnormalities, electrolyte abnormalities and anemia Patient would like to hold on the CMP and CBC as she has upcoming appointment with hematology however she is agreeable to returning on future date for vitamin B12, TSH and free T4 &And magnesium      Relevant Orders   TSH + free T4   B12 and Folate Panel   Magnesium     Musculoskeletal and Integument   Osteopenia - Primary (Chronic)    Chronic Follows with endocrinology She will continue Prolia injections as previously scheduled Patient states she has upcoming DEXA scan Continue current regimen         Other   Hyperlipidemia   Relevant Orders   Lipid Profile   Acid indigestion    Chronic Patient will continue omeprazole 20 mg        Return in about 4 months (around 07/03/2023) for htn,lipids.         The entirety of the information documented in the History of Present Illness, Review of Systems and Physical Exam were personally obtained by me. Portions of this information were initially documented by Sha'taria Tyson,CMA. I, Ronnald Ramp, MD have reviewed the documentation above for thoroughness and accuracy.     Ronnald Ramp, MD  [facility] [phone] (phone) [phone] (fax)  [facility]

## 2023-03-03 NOTE — Patient Instructions (Signed)
Lab hours  8-11:30  1-4:30P   We will follow up with results of labs once they are available.

## 2023-03-05 ENCOUNTER — Other Ambulatory Visit: Payer: Self-pay | Admitting: Oncology

## 2023-03-05 ENCOUNTER — Other Ambulatory Visit: Payer: Self-pay | Admitting: Family Medicine

## 2023-03-05 DIAGNOSIS — I10 Essential (primary) hypertension: Secondary | ICD-10-CM

## 2023-03-08 DIAGNOSIS — G629 Polyneuropathy, unspecified: Secondary | ICD-10-CM | POA: Diagnosis not present

## 2023-03-08 DIAGNOSIS — E7849 Other hyperlipidemia: Secondary | ICD-10-CM | POA: Diagnosis not present

## 2023-03-09 ENCOUNTER — Ambulatory Visit
Admission: RE | Admit: 2023-03-09 | Discharge: 2023-03-09 | Disposition: A | Discharge status: Home / Self Care | Payer: Medicare PPO | Source: Ambulatory Visit | Attending: Oncology | Admitting: Oncology

## 2023-03-09 DIAGNOSIS — C50811 Malignant neoplasm of overlapping sites of right female breast: Secondary | ICD-10-CM | POA: Insufficient documentation

## 2023-03-09 DIAGNOSIS — Z170 Estrogen receptor positive status [ER+]: Secondary | ICD-10-CM | POA: Insufficient documentation

## 2023-03-09 DIAGNOSIS — M8589 Other specified disorders of bone density and structure, multiple sites: Secondary | ICD-10-CM | POA: Insufficient documentation

## 2023-03-09 DIAGNOSIS — Z780 Asymptomatic menopausal state: Secondary | ICD-10-CM | POA: Diagnosis not present

## 2023-03-09 LAB — LIPID PANEL
Chol/HDL Ratio: 2.5 ratio (ref 0.0–4.4)
Cholesterol, Total: 140 mg/dL (ref 100–199)
HDL: 56 mg/dL (ref >39)
LDL Chol Calc (NIH): 68 mg/dL (ref 0–99)
Triglycerides: 82 mg/dL (ref 0–149)
VLDL Cholesterol Cal: 16 mg/dL (ref 5–40)

## 2023-03-09 LAB — TSH+FREE T4
Free T4: 1.33 ng/dL (ref 0.82–1.77)
TSH: 1.83 u[IU]/mL (ref 0.450–4.500)

## 2023-03-09 LAB — MAGNESIUM: Magnesium: 2 mg/dL (ref 1.6–2.3)

## 2023-03-09 LAB — B12 AND FOLATE PANEL
Folate: >20 ng/mL (ref >3.0)
Vitamin B-12: 472 pg/mL (ref 232–1245)

## 2023-03-09 NOTE — Progress Notes (Signed)
Labs normal and stable; have been reviewed.

## 2023-03-22 ENCOUNTER — Encounter: Payer: Self-pay | Admitting: Oncology

## 2023-03-22 ENCOUNTER — Inpatient Hospital Stay: Payer: Medicare PPO | Admitting: Oncology

## 2023-03-22 ENCOUNTER — Inpatient Hospital Stay: Payer: Medicare PPO

## 2023-03-22 ENCOUNTER — Inpatient Hospital Stay: Payer: Medicare PPO | Attending: Oncology

## 2023-03-22 VITALS — BP 152/70 | HR 67 | Temp 97.0°F | Resp 18 | Wt 159.2 lb

## 2023-03-22 DIAGNOSIS — M8580 Other specified disorders of bone density and structure, unspecified site: Secondary | ICD-10-CM | POA: Diagnosis not present

## 2023-03-22 DIAGNOSIS — D509 Iron deficiency anemia, unspecified: Secondary | ICD-10-CM | POA: Insufficient documentation

## 2023-03-22 DIAGNOSIS — Z923 Personal history of irradiation: Secondary | ICD-10-CM | POA: Diagnosis not present

## 2023-03-22 DIAGNOSIS — D0512 Intraductal carcinoma in situ of left breast: Secondary | ICD-10-CM

## 2023-03-22 DIAGNOSIS — Z79899 Other long term (current) drug therapy: Secondary | ICD-10-CM | POA: Insufficient documentation

## 2023-03-22 DIAGNOSIS — C50811 Malignant neoplasm of overlapping sites of right female breast: Secondary | ICD-10-CM

## 2023-03-22 DIAGNOSIS — Z803 Family history of malignant neoplasm of breast: Secondary | ICD-10-CM | POA: Insufficient documentation

## 2023-03-22 DIAGNOSIS — G629 Polyneuropathy, unspecified: Secondary | ICD-10-CM | POA: Diagnosis not present

## 2023-03-22 LAB — CBC WITH DIFFERENTIAL/PLATELET
Abs Immature Granulocytes: 0.03 10*3/uL (ref 0.00–0.07)
Basophils Absolute: 0.1 10*3/uL (ref 0.0–0.1)
Basophils Relative: 1 %
Eosinophils Absolute: 0.2 10*3/uL (ref 0.0–0.5)
Eosinophils Relative: 3 %
HCT: 35.2 % — ABNORMAL LOW (ref 36.0–46.0)
Hemoglobin: 11.5 g/dL — ABNORMAL LOW (ref 12.0–15.0)
Immature Granulocytes: 0 %
Lymphocytes Relative: 26 %
Lymphs Abs: 2 10*3/uL (ref 0.7–4.0)
MCH: 28.8 pg (ref 26.0–34.0)
MCHC: 32.7 g/dL (ref 30.0–36.0)
MCV: 88 fL (ref 80.0–100.0)
Monocytes Absolute: 0.9 10*3/uL (ref 0.1–1.0)
Monocytes Relative: 12 %
Neutro Abs: 4.4 10*3/uL (ref 1.7–7.7)
Neutrophils Relative %: 58 %
Platelets: 326 10*3/uL (ref 150–400)
RBC: 4 MIL/uL (ref 3.87–5.11)
RDW: 13.6 % (ref 11.5–15.5)
WBC: 7.5 10*3/uL (ref 4.0–10.5)
nRBC: 0 % (ref 0.0–0.2)

## 2023-03-22 LAB — COMPREHENSIVE METABOLIC PANEL
ALT: 15 U/L (ref 0–44)
AST: 19 U/L (ref 15–41)
Albumin: 4.2 g/dL (ref 3.5–5.0)
Alkaline Phosphatase: 38 U/L (ref 38–126)
Anion gap: 8 (ref 5–15)
BUN: 15 mg/dL (ref 8–23)
CO2: 26 mmol/L (ref 22–32)
Calcium: 8.8 mg/dL — ABNORMAL LOW (ref 8.9–10.3)
Chloride: 101 mmol/L (ref 98–111)
Creatinine, Ser: 0.81 mg/dL (ref 0.44–1.00)
GFR, Estimated: >60 mL/min (ref >60)
Glucose, Bld: 96 mg/dL (ref 70–99)
Potassium: 4.3 mmol/L (ref 3.5–5.1)
Sodium: 135 mmol/L (ref 135–145)
Total Bilirubin: 1.3 mg/dL — ABNORMAL HIGH (ref 0.3–1.2)
Total Protein: 6.9 g/dL (ref 6.5–8.1)

## 2023-03-22 NOTE — Assessment & Plan Note (Addendum)
DEXA 10/28/2020 10-year probability of fracture 12.5% Major Osteoporotic Fracture 2.8% Continue calcium and vitamin D supplementation. Hold off Prolia today due to upcoming dental problem.  03/09/23  DEXA showed osteopenia.

## 2023-03-22 NOTE — Progress Notes (Signed)
Pt here for follow up. States that she was started on Gabapentin due to numbness and tingling to hands. Pt also reports that she has a broken tooth and will see oral surgeon to follow up on it.

## 2023-03-22 NOTE — Assessment & Plan Note (Addendum)
#  Left breast DCIS high grade. pTis, ER positive 2022 Status post left lumpectomy-margin positive status post reexcision achieve negative margin followed by adjuvant breast radiation.Currently on adjuvant endocrine therapy-Aromasin. She tolerates with mild difficulties. Labs are reviewed with patient. Continue Aromasin plan for total 5 years. Continue annual mammogram-patient plans to get images done through surgeons office. 

## 2023-03-22 NOTE — Progress Notes (Signed)
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 Currently on adjuvant endocrine therapy-Aromasin. She tolerates with mild difficulties. Labs are reviewed with patient. Continue Aromasin plan for total 5 years. Continue annual mammogram-patient plans to get images done through surgeons office.  Osteopenia DEXA 10/28/2020 10-year probability of fracture 12.5% Major Osteoporotic Fracture 2.8% Continue calcium and vitamin D supplementation. Hold off Prolia today due to upcoming dental problem.  03/09/23  DEXA showed osteopenia.   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  AI has low probability causing carpel tunnel syndrome 2%, parasthenia 3% There are many other etiolgies which could cause neuropathy.  I recommend her to consider neurology evaluation.  Orders Placed This Encounter  Procedures   CMP (Cancer Center only)    Standing Status:   Future    Standing Expiration Date:   03/21/2024   CBC with Differential (Cancer Center Only)    Standing Status:   Future    Standing Expiration Date:   03/21/2024   Follow up in 6 months All questions were answered. The patient knows to call the clinic with any problems, questions or concerns.  Claire Kane [facility] Hematology Oncology 03/22/2023   No orders of the defined types were placed in this encounter.  Follow-up in 6 months. All questions were answered. The patient knows to call the clinic with any problems, questions or concerns.  Claire Kane [facility] Hematology Oncology 03/22/2023    HISTORY OF PRESENTING ILLNESS:  Claire Kane is a  82 y.o.   female with PMH listed below who was referred to me for evaluation of anemia.  Patient reports she was told that she had iron deficiency 2 years ago and she took iron supplementation for a while and was told to stop. Recently found to to have worsening of anemia. She had Upper and lower endoscopy in 2017 by Dr.Sankar # Upper endoscopy 12/07/2017 Normal examined duodenum. - Large hiatal hernia. - Erythematous mucosa in the antrum. Biopsied. - The examination was otherwise normal # Colonoscopy on 12/07/2017  Non-thrombosed external hemorrhoids found on perianal exam. - Diverticulosis in the sigmoid colon and in the descending colon. - The examination was otherwise normal on direct and retroflexion views. - No specimens collected. #  capsule study which did not reveal any active bleeding source.  # Remote history of DCIS right breast in  2002, s/p surgery and RT. Finished 5 years of tamoxifen.   # April - May s/p upper and lower endoscopy, capsule study which did not reveal active bleeding source  Celiac panel positive.  # iron deficiency anemia.  Previously she has received IV Venofer. Patient has previously underwent extensive gastroenterology work-up including EGD, colonoscopy, capsule study which did not reveal active bleeding source.  #08/18/2020, screening mammogram showed left breast calcification. 09/07/2020, diagnostic left mammogram showed an area of 1.4 cm group of calcifications in the upper outer left breast.  Patient underwent breast biopsy, pathology came back positive for high-grade DCIS, focal necrosis, associated with calcifications.  ER status deferred to existing specimen.  # #10/07/2020, status post left DCIS lumpectomy by Dr. Lemar Livings Pathology showed residual high-grade DCIS with associated calcifications.  Extent of DCIS at  least 12 mm, grade 3, central necrosis, anterior and inferior margins were unifocal in positive for DCIS pTis, ER positive.  11/09/2020 reexcision  showed no residual DCIS or invasive carcinoma. 01/04/2021, adjuvant breast radiation  Patient reports family history of VTE daughter who was tested positive for mutation. Significant family history of cardiovascular disease, familiar hypercholesterolemia. Parents, sister, brother passed away from heart attack.  Patient declined genetic testing.  01/08/2021, started on Arimidex.  09/01/2021, switched to Aromasin due to arthralgia.    08/23/22 bilateral diagnostic mammogram - Now identified near the LEFT lumpectomy site are small round calcifications in a somewhat linear orientation spanning a distance of 1 cm. Tissue sampling is recommended. 09/07/22 left breast stereotactic core needle biopsy-benign mammary parenchyma with fibrocystic and fibroadenomatoid changes and focal usual  ductal hyperplasia. No definite evidence of residual atypical proliferative breast disease.    Interval History 82 year old female with pertinent hematology history listed above reviewed by me today presents to reestablish care for left breast DCIS.   Patient takes Aromasin 25mg  daily.  Overall she tolerates well.  She had complaints of arthralgia, especially right hip. + carpel tunnel syndrome, now on gabapentin, prescribed by primary care physician. + broken tooth, she will see dentist.  + eye brow loss    Review of Systems  Constitutional:  Negative for chills, diaphoresis, fever, malaise/fatigue and weight loss.  HENT:  Negative for ear discharge, nosebleeds and sore throat.   Eyes:  Negative for double vision, photophobia, discharge and redness.  Respiratory:  Negative for cough, hemoptysis, sputum production, shortness of breath and wheezing.   Cardiovascular:  Negative for chest pain, palpitations, orthopnea, claudication and leg swelling.  Gastrointestinal:  Negative for abdominal pain, blood in stool, heartburn, nausea and vomiting.       Acid reflux  Genitourinary:  Negative for dysuria and urgency.   Musculoskeletal:  Positive for joint pain. Negative for back pain and neck pain.  Skin:  Negative for itching and rash.  Neurological:  Positive for tingling. Negative for dizziness, tremors and sensory change.  Endo/Heme/Allergies:  Negative for environmental allergies. Does not bruise/bleed easily.  Psychiatric/Behavioral:  Negative for depression, hallucinations, substance abuse and suicidal ideas.     MEDICAL HISTORY:  Past Medical History:  Diagnosis Date   Anemia    fe deficiency now improved at age 45   Anxiety    Arthritis    BRCA negative 08/04/2015   BRCA1 and BRCA2   Breast cancer (HCC) 2002   DCIS right breast    Breast cancer (HCC) 09/2020   left breast   Cataract    GERD (gastroesophageal reflux disease)    Heart murmur 2001   Hiatal hernia    High cholesterol    Hypertension    patient denies anyone telling her that she has htn   Personal history of radiation therapy 2002, 2022   BREAST CA   Rosacea    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 complications     SURGICAL HISTORY: Past Surgical History:  Procedure Laterality Date   ABDOMINAL HYSTERECTOMY     BLEPHAROPLASTY     BREAST BIOPSY Left 09/15/2020   affirm bx, x marker, DCIS   BREAST BIOPSY Left 09/07/2022   Left Breast Stereo Bx X clip- path pending   BREAST BIOPSY Left 09/07/2022   MM LT BREAST BX W LOC DEV 1ST LESION IMAGE BX SPEC STEREO GUIDE 09/07/2022 [facility]   BREAST CYST ASPIRATION Left 2003   BREAST EXCISIONAL BIOPSY Right 2002  POS   BREAST LUMPECTOMY Right 2002   w/ radiation   BREAST LUMPECTOMY Left 10/07/2020   RESIDUAL HIGH-GRADE DUCTAL CARCINOMA IN SITU (DCIS) DCIS is focally present 1.4 mm to the superior margin.   BREAST LUMPECTOMY Left    re-excision   BREAST LUMPECTOMY WITH NEEDLE LOCALIZATION Left 10/07/2020   Procedure: BREAST LUMPECTOMY WITH NEEDLE LOCALIZATION;  Surgeon: Earline Mayotte, MD;  Location: [facility] ORS;  Service: General;  Laterality: Left;    BREAST SURGERY Right 2002   lumpectomy   CATARACT EXTRACTION W/ INTRAOCULAR LENS  IMPLANT, BILATERAL     CHOLECYSTECTOMY     COLONOSCOPY  2013   COLONOSCOPY WITH PROPOFOL N/A 12/08/2015   Procedure: COLONOSCOPY WITH PROPOFOL;  Surgeon: Kieth Brightly, MD;  Location: [facility] ENDOSCOPY;  Service: Endoscopy;  Laterality: N/A;   COLONOSCOPY WITH PROPOFOL N/A 12/06/2017   Procedure: COLONOSCOPY WITH PROPOFOL;  Surgeon: Wyline Mood, MD;  Location: [facility] ENDOSCOPY;  Service: Gastroenterology;  Laterality: N/A;   COLONOSCOPY WITH PROPOFOL N/A 12/22/2017   Procedure: COLONOSCOPY WITH PROPOFOL;  Surgeon: Wyline Mood, MD;  Location[facility] ENDOSCOPY;  Service: Gastroenterology;  Laterality: N/A;   ESOPHAGOGASTRODUODENOSCOPY (EGD) WITH PROPOFOL N/A 12/08/2015   Procedure: ESOPHAGOGASTRODUODENOSCOPY (EGD) WITH PROPOFOL;  Surgeon: Kieth Brightly, MD;  Location: [facility] ENDOSCOPY;  Service: Endoscopy;  Laterality: N/A;   ESOPHAGOGASTRODUODENOSCOPY (EGD) WITH PROPOFOL N/A 12/06/2017   Procedure: ESOPHAGOGASTRODUODENOSCOPY (EGD) WITH PROPOFOL;  Surgeon: Wyline Mood, MD;  Location: [facility] ENDOSCOPY;  Service: Gastroenterology;  Laterality: N/A;   ESOPHAGOGASTRODUODENOSCOPY (EGD) WITH PROPOFOL N/A 12/22/2017   Procedure: ESOPHAGOGASTRODUODENOSCOPY (EGD) WITH PROPOFOL;  Surgeon: Wyline Mood, MD;  Location: [facility] ENDOSCOPY;  Service: Gastroenterology;  Laterality: N/A;   EYE SURGERY Right 2014   EYE SURGERY Left 2014   GIVENS CAPSULE STUDY N/A 01/23/2018   Procedure: GIVENS CAPSULE STUDY;  Surgeon: Wyline Mood, MD;  Location: [facility] ENDOSCOPY;  Service: Gastroenterology;  Laterality: N/A;   iron infusion     had 5 iron infusions for anemia   post radiation therapy Right    right breast cancer 2002   RE-EXCISION OF BREAST CANCER,SUPERIOR MARGINS Left 11/09/2020   Procedure: RE-EXCISION OF BREAST CANCER,SUPERIOR MARGINS;  Surgeon: Earline Mayotte, MD;  Location: [facility] ORS;  Service: General;  Laterality:  Left;   Stab pheblectomy   2010   vein closure procedure Bilateral 2008    SOCIAL HISTORY: Social History   Socioeconomic History   Marital status: Married    Spouse name: Peyton Najjar   Number of children: 2   Years of education: Not on file   Highest education level: Bachelor's degree (e.g., BA, AB, BS)  Occupational History   Occupation: Runner, broadcasting/film/video    Comment: retired  Tobacco Use   Smoking status: Never   Smokeless tobacco: Never  Vaping Use   Vaping Use: Never used  Substance and Sexual Activity   Alcohol use: No   Drug use: No   Sexual activity: Never  Other Topics Concern   Not on file  Social History Narrative   Patient lives with husband. She feels safe in her home.   Social Determinants of Health   Financial Resource Strain: Low Risk  (02/01/2023)   Overall Financial Resource Strain (CARDIA)    Difficulty of Paying Living Expenses: Not hard at all  Food Insecurity: No Food Insecurity (02/01/2023)   Hunger Vital Sign    Worried About Running Out of Food in the Last Year: Never true    Ran Out of Food in the Last Year: Never  true  Transportation Needs: No Transportation Needs (02/01/2023)   PRAPARE - Administrator, Civil Service (Medical): No    Lack of Transportation (Non-Medical): No  Physical Activity: Inactive (02/01/2023)   Exercise Vital Sign    Days of Exercise per Week: 0 days    Minutes of Exercise per Session: 0 min  Stress: No Stress Concern Present (02/01/2023)   [employer] of Occupational Health - Occupational Stress Questionnaire    Feeling of Stress : Not at all  Social Connections: Moderately Isolated (02/01/2023)   Social Connection and Isolation Panel [NHANES]    Frequency of Communication with Friends and Family: More than three times a week    Frequency of Social Gatherings with Friends and Family: Once a week    Attends Religious Services: Never    Database administrator or Organizations: No    Attends Banker  Meetings: Never    Marital Status: Married  Catering manager Violence: Not At Risk (02/01/2023)   Humiliation, Afraid, Rape, and Kick questionnaire    Fear of Current or Ex-Partner: No    Emotionally Abused: No    Physically Abused: No    Sexually Abused: No    FAMILY HISTORY: Family History  Problem Relation Age of Onset   Heart attack Mother    Heart attack Father    Heart attack Sister    Stroke Sister    Heart attack Brother    Other Brother        cerebral hemorrhage, sepsis   Breast cancer Cousin 50       breast/maternal   Cancer Cousin 60       breast/maternal   Breast cancer Cousin    Breast cancer Maternal Aunt 58   Breast cancer Other 48       maternal neice    ALLERGIES:  is allergic to zometa [zoledronic acid].  MEDICATIONS:  Current Outpatient Medications  Medication Sig Dispense Refill   amLODipine (NORVASC) 5 MG tablet TAKE 1 TABLET EVERY DAY 90 tablet 3   atorvastatin (LIPITOR) 10 MG tablet TAKE 1 TABLET EVERY DAY 90 tablet 3   calcium citrate (CALCITRATE - DOSED IN MG ELEMENTAL CALCIUM) 950 MG tablet Take 200 mg of elemental calcium by mouth 2 (two) times daily.      clotrimazole-betamethasone (LOTRISONE) cream Apply 1 Application topically 2 (two) times daily. 30 g 6   cycloSPORINE (RESTASIS) 0.05 % ophthalmic emulsion Place 1 drop into both eyes 2 (two) times daily.     docusate sodium (COLACE) 100 MG capsule Take 100 mg by mouth daily as needed for mild constipation.     exemestane (AROMASIN) 25 MG tablet TAKE 1 TABLET EVERY DAY AFTER BREAKFAST 90 tablet 3   ezetimibe (ZETIA) 10 MG tablet TAKE 1 TABLET EVERY DAY 90 tablet 3   gabapentin (NEURONTIN) 100 MG capsule Take 1 capsule (100 mg total) by mouth at bedtime. 90 capsule 1   Glucosamine 750 MG TABS Take 750 mg by mouth daily.     Glycerin-Polysorbate 80 (REFRESH DRY EYE THERAPY OP) Place 1 drop into both eyes 2 (two) times daily.     Multiple Vitamins-Minerals (MULTIVITAMIN WITH MINERALS) tablet  Take 1 tablet by mouth daily.     omeprazole (PRILOSEC) 40 MG capsule Take 1 capsule (40 mg total) by mouth daily. 90 capsule 1   psyllium (REGULOID) 0.52 G capsule Take 2 capsules by mouth daily.     solifenacin (VESICARE) 10 MG tablet Take 1  tablet (10 mg total) by mouth daily. 90 tablet 3   SOOLANTRA 1 % CREA Apply 1 application  topically daily as needed (rosacea).     spironolactone (ALDACTONE) 50 MG tablet Take 50 mg by mouth daily.     traMADol (ULTRAM) 50 MG tablet TAKE 1 TABLET EVERY 12 HOURS AS NEEDED FOR SEVERE PAIN 60 tablet 0   doxycycline (PERIOSTAT) 20 MG tablet  (Patient not taking: Reported on 03/22/2023)     simethicone (MYLICON) 80 MG chewable tablet Chew 80 mg by mouth every 6 (six) hours as needed for flatulence. (Patient not taking: Reported on 03/22/2023)     No current facility-administered medications for this visit.   Facility-Administered Medications Ordered in Other Visits  Medication Dose Route Frequency Provider Last Rate Last Admin   0.9 %  sodium chloride infusion   Intravenous Once Claire Patience, MD         PHYSICAL EXAMINATION: ECOG PERFORMANCE STATUS: 1 - Symptomatic but completely ambulatory Vitals:   03/22/23 1322  BP: (!) 152/70  Pulse: 67  Resp: 18  Temp: (!) 97 F (36.1 C)   Filed Weights   03/22/23 1322  Weight: 159 lb 3.2 oz (72.2 kg)    Physical Exam Constitutional:      General: She is not in acute distress.    Appearance: She is not diaphoretic.     Comments: Obese  HENT:     Head: Normocephalic and atraumatic.  Eyes:     General: No scleral icterus. Cardiovascular:     Rate and Rhythm: Normal rate.  Pulmonary:     Effort: Pulmonary effort is normal. No respiratory distress.     Breath sounds: Normal breath sounds.  Abdominal:     General: There is no distension.     Palpations: Abdomen is soft.  Musculoskeletal:        General: Normal range of motion.     Cervical back: Normal range of motion and neck supple.  Lymphadenopathy:      Cervical: No cervical adenopathy.  Skin:    General: Skin is warm and dry.  Neurological:     Mental Status: She is alert and oriented to person, place, and time. Mental status is at baseline.     Cranial Nerves: No cranial nerve deficit.     Motor: No abnormal muscle tone.  Psychiatric:        Mood and Affect: Affect normal.        Cognition and Memory: Memory normal.        Judgment: Judgment normal.      LABORATORY DATA:  I have reviewed the data as listed    Latest Ref Rng & Units 03/22/2023   12:39 PM 09/14/2022   12:39 PM 09/01/2022   11:24 AM  CBC  WBC 4.0 - 10.5 K/uL 7.5  10.1  7.7   Hemoglobin 12.0 - 15.0 g/dL 13.0  86.5  78.4   Hematocrit 36.0 - 46.0 % 35.2  38.0  39.0   Platelets 150 - 400 K/uL 326  332  381       Latest Ref Rng & Units 03/22/2023   12:39 PM 09/14/2022   12:39 PM 09/01/2022   11:24 AM  CMP  Glucose 70 - 99 mg/dL 96  89  696   BUN 8 - 23 mg/dL 15  12  12    Creatinine 0.44 - 1.00 mg/dL 2.95  2.84  1.32   Sodium 135 - 145 mmol/L 135  137  142  Potassium 3.5 - 5.1 mmol/L 4.3  4.3  4.5   Chloride 98 - 111 mmol/L 101  101  104   CO2 22 - 32 mmol/L 26  27  23    Calcium 8.9 - 10.3 mg/dL 8.8  8.9  9.1   Total Protein 6.5 - 8.1 g/dL 6.9  7.0  6.8   Total Bilirubin 0.3 - 1.2 mg/dL 1.3  1.0  1.3   Alkaline Phos 38 - 126 U/L 38  39  46   AST 15 - 41 U/L 19  21  19    ALT 0 - 44 U/L 15  20  17

## 2023-03-22 NOTE — Assessment & Plan Note (Signed)
Has been started on gabapentin.  AI has low probability causing carpel tunnel syndrome 2%, parasthenia 3% There are many other etiolgies which could cause neuropathy.  I recommend her to consider neurology evaluation.

## 2023-04-13 ENCOUNTER — Telehealth: Payer: Self-pay | Admitting: *Deleted

## 2023-04-13 ENCOUNTER — Other Ambulatory Visit: Payer: Self-pay

## 2023-04-13 DIAGNOSIS — D0512 Intraductal carcinoma in situ of left breast: Secondary | ICD-10-CM

## 2023-04-13 DIAGNOSIS — G629 Polyneuropathy, unspecified: Secondary | ICD-10-CM

## 2023-04-13 NOTE — Telephone Encounter (Signed)
Patient called and would like to go ahead and be referred to Neurologist  for her hands numbness and tingling

## 2023-04-13 NOTE — Telephone Encounter (Signed)
Morrie Sheldon can you set up appt with Dr. Barbaraann Cao and inform pt of appt please.

## 2023-05-19 ENCOUNTER — Encounter: Payer: Self-pay | Admitting: Internal Medicine

## 2023-05-19 ENCOUNTER — Inpatient Hospital Stay: Payer: Medicare PPO | Attending: Oncology | Admitting: Internal Medicine

## 2023-05-19 VITALS — BP 139/73 | HR 75 | Temp 97.4°F | Wt 159.0 lb

## 2023-05-19 DIAGNOSIS — D0512 Intraductal carcinoma in situ of left breast: Secondary | ICD-10-CM | POA: Insufficient documentation

## 2023-05-19 DIAGNOSIS — G629 Polyneuropathy, unspecified: Secondary | ICD-10-CM | POA: Diagnosis not present

## 2023-05-19 DIAGNOSIS — Z79811 Long term (current) use of aromatase inhibitors: Secondary | ICD-10-CM | POA: Diagnosis not present

## 2023-05-19 DIAGNOSIS — G620 Drug-induced polyneuropathy: Secondary | ICD-10-CM | POA: Insufficient documentation

## 2023-05-19 DIAGNOSIS — Z79899 Other long term (current) drug therapy: Secondary | ICD-10-CM | POA: Diagnosis not present

## 2023-05-19 NOTE — Progress Notes (Signed)
[facility] at [facility] [address]  [place], [place] [address] [phone]   New Patient Evaluation  Date of Service: 05/19/23 Patient Name: Claire Kane Patient MRN: [id number] Patient DOB: Jan 06, 1941 Provider: Henreitta Leber, MD  Identifying Statement:  Claire Kane is a 82 y.o. female with Neuropathy who presents for initial consultation and evaluation regarding cancer associated neurologic deficits.    Referring Provider: Ronnald Ramp, MD [address] [place],  [place] [address]  Primary Cancer:  Oncologic History: Oncology History  Ductal carcinoma in situ (DCIS) of left breast  10/07/2020 Cancer Staging   Staging form: Breast, AJCC 8th Edition - Pathologic stage from 10/07/2020: Stage 0 (pTis (DCIS), pN0, cM0, ER+, PR: Not Assessed, HER2: Not Assessed) - Signed by [name], MD on 09/14/2022 Stage prefix: Initial diagnosis Nuclear grade: G3 Multigene prognostic tests performed: None   10/21/2020 Initial Diagnosis   Ductal carcinoma in situ (DCIS) of left breast     History of Present Illness: The patient's records from the referring physician were obtained and reviewed and the patient interviewed to confirm this HPI.  Claire Kane presents to review neuropathic symptoms.  She describes longstanding history of numbness, pain and tingling in her hands/palms, right worse than left.  It is often worse at night.  She does have a splint which improves symptoms when she wears it at night.  Also describes numbness in her toes and lower feet, mild in nature.  No issues with gait.  Medications: Current Outpatient Medications on File Prior to Visit  Medication Sig Dispense Refill   amLODipine (NORVASC) 5 MG tablet TAKE 1 TABLET EVERY DAY 90 tablet 3   atorvastatin (LIPITOR) 10 MG tablet TAKE 1 TABLET EVERY DAY 90 tablet 3   calcium citrate (CALCITRATE - DOSED IN MG ELEMENTAL CALCIUM) 950 MG tablet Take 200 mg of  elemental calcium by mouth 2 (two) times daily.      clotrimazole-betamethasone (LOTRISONE) cream Apply 1 Application topically 2 (two) times daily. 30 g 6   cycloSPORINE (RESTASIS) 0.05 % ophthalmic emulsion Place 1 drop into both eyes 2 (two) times daily.     docusate sodium (COLACE) 100 MG capsule Take 100 mg by mouth daily as needed for mild constipation.     doxycycline (PERIOSTAT) 20 MG tablet      exemestane (AROMASIN) 25 MG tablet TAKE 1 TABLET EVERY DAY AFTER BREAKFAST 90 tablet 3   ezetimibe (ZETIA) 10 MG tablet TAKE 1 TABLET EVERY DAY 90 tablet 3   gabapentin (NEURONTIN) 100 MG capsule Take 1 capsule (100 mg total) by mouth at bedtime. 90 capsule 1   Glucosamine 750 MG TABS Take 750 mg by mouth daily.     Glycerin-Polysorbate 80 (REFRESH DRY EYE THERAPY OP) Place 1 drop into both eyes 2 (two) times daily.     Multiple Vitamins-Minerals (MULTIVITAMIN WITH MINERALS) tablet Take 1 tablet by mouth daily.     omeprazole (PRILOSEC) 40 MG capsule Take 1 capsule (40 mg total) by mouth daily. 90 capsule 1   psyllium (REGULOID) 0.52 G capsule Take 2 capsules by mouth daily.     solifenacin (VESICARE) 10 MG tablet Take 1 tablet (10 mg total) by mouth daily. 90 tablet 3   SOOLANTRA 1 % CREA Apply 1 application  topically daily as needed (rosacea).     spironolactone (ALDACTONE) 50 MG tablet Take 50 mg by mouth daily.     traMADol (ULTRAM) 50 MG tablet TAKE  1 TABLET EVERY 12 HOURS AS NEEDED FOR SEVERE PAIN 60 tablet 0   simethicone (MYLICON) 80 MG chewable tablet Chew 80 mg by mouth every 6 (six) hours as needed for flatulence. (Patient not taking: Reported on 03/22/2023)     Current Facility-Administered Medications on File Prior to Visit  Medication Dose Route Frequency Provider Last Rate Last Admin   0.9 %  sodium chloride infusion   Intravenous Once [name], MD        Allergies:  Allergies  Allergen Reactions   Zometa [Zoledronic Acid]     Severe back pain   Past Medical History:   Past Medical History:  Diagnosis Date   Anemia    fe deficiency now improved at age 55   Anxiety    Arthritis    BRCA negative 08/04/2015   BRCA1 and BRCA2   Breast cancer (HCC) 2002   DCIS right breast    Breast cancer (HCC) 09/2020   left breast   Cataract    GERD (gastroesophageal reflux disease)    Heart murmur 2001   Hiatal hernia    High cholesterol    Hypertension    patient denies anyone telling her that she has htn   Personal history of radiation therapy 2002, 2022   BREAST CA   Rosacea    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 complications    Past Surgical History:  Past Surgical History:  Procedure Laterality Date   ABDOMINAL HYSTERECTOMY     BLEPHAROPLASTY     BREAST BIOPSY Left 09/15/2020   affirm bx, x marker, DCIS   BREAST BIOPSY Left 09/07/2022   Left Breast Stereo Bx X clip- path pending   BREAST BIOPSY Left 09/07/2022   MM LT BREAST BX W LOC DEV 1ST LESION IMAGE BX SPEC STEREO GUIDE 09/07/2022 [facility]   BREAST CYST ASPIRATION Left 2003   BREAST EXCISIONAL BIOPSY Right 2002   POS   BREAST LUMPECTOMY Right 2002   w/ radiation   BREAST LUMPECTOMY Left 10/07/2020   RESIDUAL HIGH-GRADE DUCTAL CARCINOMA IN SITU (DCIS) DCIS is focally present 1.4 mm to the superior margin.   BREAST LUMPECTOMY Left    re-excision   BREAST LUMPECTOMY WITH NEEDLE LOCALIZATION Left 10/07/2020   Procedure: BREAST LUMPECTOMY WITH NEEDLE LOCALIZATION;  Surgeon: Earline Mayotte, MD;  Location: [facility];  Service: General;  Laterality: Left;   BREAST SURGERY Right 2002   lumpectomy   CATARACT EXTRACTION W/ INTRAOCULAR LENS  IMPLANT, BILATERAL     CHOLECYSTECTOMY     COLONOSCOPY  2013   COLONOSCOPY WITH PROPOFOL N/A 12/08/2015   Procedure: COLONOSCOPY WITH PROPOFOL;  Surgeon: Kieth Brightly, MD;  Location: [facility] ENDOSCOPY;  Service: Endoscopy;  Laterality: N/A;   COLONOSCOPY WITH PROPOFOL N/A 12/06/2017   Procedure: COLONOSCOPY WITH PROPOFOL;   Surgeon: Wyline Mood, MD;  Location: [facility] ENDOSCOPY;  Service: Gastroenterology;  Laterality: N/A;   COLONOSCOPY WITH PROPOFOL N/A 12/22/2017   Procedure: COLONOSCOPY WITH PROPOFOL;  Surgeon: Wyline Mood, MD;  Location: [facility] ENDOSCOPY;  Service: Gastroenterology;  Laterality: N/A;   ESOPHAGOGASTRODUODENOSCOPY (EGD) WITH PROPOFOL N/A 12/08/2015   Procedure: ESOPHAGOGASTRODUODENOSCOPY (EGD) WITH PROPOFOL;  Surgeon: Kieth Brightly, MD;  Location: [facility] ENDOSCOPY;  Service: Endoscopy;  Laterality: N/A;   ESOPHAGOGASTRODUODENOSCOPY (EGD) WITH PROPOFOL N/A 12/06/2017   Procedure: ESOPHAGOGASTRODUODENOSCOPY (EGD) WITH PROPOFOL;  Surgeon: Wyline Mood, MD;  Location: [facility] ENDOSCOPY;  Service: Gastroenterology;  Laterality: N/A;   ESOPHAGOGASTRODUODENOSCOPY (EGD) WITH PROPOFOL N/A 12/22/2017   Procedure: ESOPHAGOGASTRODUODENOSCOPY (EGD) WITH  PROPOFOL;  Surgeon: Wyline Mood, MD;  Location[facility] ENDOSCOPY;  Service: Gastroenterology;  Laterality: N/A;   EYE SURGERY Right 2014   EYE SURGERY Left 2014   GIVENS CAPSULE STUDY N/A 01/23/2018   Procedure: GIVENS CAPSULE STUDY;  Surgeon: Wyline Mood, MD;  Location: [facility] ENDOSCOPY;  Service: Gastroenterology;  Laterality: N/A;   iron infusion     had 5 iron infusions for anemia   post radiation therapy Right    right breast cancer 2002   RE-EXCISION OF BREAST CANCER,SUPERIOR MARGINS Left 11/09/2020   Procedure: RE-EXCISION OF BREAST CANCER,SUPERIOR MARGINS;  Surgeon: Earline Mayotte, MD;  Location: [facility] ORS;  Service: General;  Laterality: Left;   Stab pheblectomy   2010   vein closure procedure Bilateral 2008   Social History:  Social History   Socioeconomic History   Marital status: Married    Spouse name: Peyton Najjar   Number of children: 2   Years of education: Not on file   Highest education level: Bachelor's degree (e.g., BA, AB, BS)  Occupational History   Occupation: Runner, broadcasting/film/video    Comment: retired  Tobacco Use   Smoking status: Never    Smokeless tobacco: Never  Vaping Use   Vaping status: Never Used  Substance and Sexual Activity   Alcohol use: No   Drug use: No   Sexual activity: Never  Other Topics Concern   Not on file  Social History Narrative   Patient lives with husband. She feels safe in her home.   Social Determinants of Health   Financial Resource Strain: Low Risk  (02/01/2023)   Overall Financial Resource Strain (CARDIA)    Difficulty of Paying Living Expenses: Not hard at all  Food Insecurity: No Food Insecurity (02/01/2023)   Hunger Vital Sign    Worried About Running Out of Food in the Last Year: Never true    Ran Out of Food in the Last Year: Never true  Transportation Needs: No Transportation Needs (02/01/2023)   PRAPARE - Administrator, Civil Service (Medical): No    Lack of Transportation (Non-Medical): No  Physical Activity: Inactive (02/01/2023)   Exercise Vital Sign    Days of Exercise per Week: 0 days    Minutes of Exercise per Session: 0 min  Stress: No Stress Concern Present (02/01/2023)   [employer] of Occupational Health - Occupational Stress Questionnaire    Feeling of Stress : Not at all  Social Connections: Moderately Isolated (02/01/2023)   Social Connection and Isolation Panel [NHANES]    Frequency of Communication with Friends and Family: More than three times a week    Frequency of Social Gatherings with Friends and Family: Once a week    Attends Religious Services: Never    Database administrator or Organizations: No    Attends Banker Meetings: Never    Marital Status: Married  Catering manager Violence: Not At Risk (02/01/2023)   Humiliation, Afraid, Rape, and Kick questionnaire    Fear of Current or Ex-Partner: No    Emotionally Abused: No    Physically Abused: No    Sexually Abused: No   Family History:  Family History  Problem Relation Age of Onset   Heart attack Mother    Heart attack Father    Heart attack Sister    Stroke Sister     Heart attack Brother    Other Brother        cerebral hemorrhage, sepsis   Breast cancer Cousin 50  breast/maternal   Cancer Cousin 60       breast/maternal   Breast cancer Cousin    Breast cancer Maternal Aunt 27   Breast cancer Other 48       maternal neice    Review of Systems: Constitutional: Doesn't report fevers, chills or abnormal weight loss Eyes: Doesn't report blurriness of vision Ears, nose, mouth, throat, and face: Doesn't report sore throat Respiratory: Doesn't report cough, dyspnea or wheezes Cardiovascular: Doesn't report palpitation, chest discomfort  Gastrointestinal:  Doesn't report nausea, constipation, diarrhea GU: Doesn't report incontinence Skin: Doesn't report skin rashes Neurological: Per HPI Musculoskeletal: Doesn't report joint pain Behavioral/Psych: Doesn't report anxiety  Physical Exam: Vitals:   05/19/23 1057  BP: 139/73  Pulse: 75  Temp: (!) 97.4 F (36.3 C)  SpO2: 100%   KPS: 90. General: Alert, cooperative, pleasant, in no acute distress Head: Normal EENT: No conjunctival injection or scleral icterus.  Lungs: Resp effort normal Cardiac: Regular rate Abdomen: Non-distended abdomen Skin: No rashes cyanosis or petechiae. Extremities: No clubbing or edema  Neurologic Exam: Mental Status: Awake, alert, attentive to examiner. Oriented to self and environment. Language is fluent with intact comprehension.  Cranial Nerves: Visual acuity is grossly normal. Visual fields are full. Extra-ocular movements intact. No ptosis. Face is symmetric Motor: Tone and bulk are normal. Power is full in both arms and legs. Reflexes are symmetric, no pathologic reflexes present.  Sensory: Intact to light touch Gait: Normal.   Labs: I have reviewed the data as listed    Component Value Date/Time   NA 135 03/22/2023 1239   NA 142 09/01/2022 1124   K 4.3 03/22/2023 1239   CL 101 03/22/2023 1239   CO2 26 03/22/2023 1239   GLUCOSE 96 03/22/2023  1239   BUN 15 03/22/2023 1239   BUN 12 09/01/2022 1124   CREATININE 0.81 03/22/2023 1239   CALCIUM 8.8 (L) 03/22/2023 1239   PROT 6.9 03/22/2023 1239   PROT 6.8 09/01/2022 1124   ALBUMIN 4.2 03/22/2023 1239   ALBUMIN 4.6 09/01/2022 1124   AST 19 03/22/2023 1239   ALT 15 03/22/2023 1239   ALKPHOS 38 03/22/2023 1239   BILITOT 1.3 (H) 03/22/2023 1239   BILITOT 1.3 (H) 09/01/2022 1124   GFRNONAA >60 03/22/2023 1239   GFRAA 79 06/18/2020 0929   Lab Results  Component Value Date   WBC 7.5 03/22/2023   NEUTROABS 4.4 03/22/2023   HGB 11.5 (L) 03/22/2023   HCT 35.2 (L) 03/22/2023   MCV 88.0 03/22/2023   PLT 326 03/22/2023     Assessment/Plan Neuropathy  Claire Kane presents with clinical syndrome consistent with symmetric, length dependent, small and large fiber peripheral neuropathy.  Etiology is unclear, potentially exposure to aromatase inhibitor.  Length dependent symptoms are quite mild.  In addition, she has compression neuropathies, carpal tunnel syndrome, right worse than left.  We recommended more aggressive splinting at night.  She should go back to orthopedics for localized injection if symptoms progress further.  Will defer oral steroids.  Gabapentin may continue 100mg  HS or increase to 200mg  HS if preferred.  We reviewed pathophysiology of chemotherapy induced neuropathy, available treatments, and goals of care.  We spent twenty additional minutes teaching regarding the natural history, biology, and historical experience in the treatment of neurologic complications of cancer.   We appreciate the opportunity to participate in the care of Claire Kane.  She should follow up as needed with worsening symptoms.  All questions were answered. The patient  knows to call the clinic with any problems, questions or concerns. No barriers to learning were detected.  The total time spent in the encounter was 40 minutes and more than 50% was on counseling and review of test  results   Henreitta Leber, MD Medical Director of Neuro-Oncology [facility] 05/19/23 11:14 AM

## 2023-06-06 DIAGNOSIS — M1812 Unilateral primary osteoarthritis of first carpometacarpal joint, left hand: Secondary | ICD-10-CM | POA: Diagnosis not present

## 2023-06-06 DIAGNOSIS — M189 Osteoarthritis of first carpometacarpal joint, unspecified: Secondary | ICD-10-CM | POA: Insufficient documentation

## 2023-06-06 DIAGNOSIS — G5601 Carpal tunnel syndrome, right upper limb: Secondary | ICD-10-CM

## 2023-06-06 HISTORY — DX: Osteoarthritis of first carpometacarpal joint, unspecified: M18.9

## 2023-06-06 HISTORY — DX: Carpal tunnel syndrome, right upper limb: G56.01

## 2023-06-12 ENCOUNTER — Emergency Department: Payer: Medicare PPO

## 2023-06-12 ENCOUNTER — Other Ambulatory Visit: Payer: Self-pay

## 2023-06-12 ENCOUNTER — Emergency Department
Admission: EM | Admit: 2023-06-12 | Discharge: 2023-06-12 | Disposition: A | Discharge status: Home / Self Care | Payer: Medicare PPO | Attending: Student in an Organized Health Care Education/Training Program | Admitting: Student in an Organized Health Care Education/Training Program

## 2023-06-12 DIAGNOSIS — Z23 Encounter for immunization: Secondary | ICD-10-CM | POA: Diagnosis not present

## 2023-06-12 DIAGNOSIS — S0101XA Laceration without foreign body of scalp, initial encounter: Secondary | ICD-10-CM | POA: Insufficient documentation

## 2023-06-12 DIAGNOSIS — S0990XA Unspecified injury of head, initial encounter: Secondary | ICD-10-CM | POA: Diagnosis not present

## 2023-06-12 DIAGNOSIS — I700 Atherosclerosis of aorta: Secondary | ICD-10-CM | POA: Diagnosis not present

## 2023-06-12 DIAGNOSIS — R0781 Pleurodynia: Secondary | ICD-10-CM | POA: Diagnosis not present

## 2023-06-12 DIAGNOSIS — W19XXXA Unspecified fall, initial encounter: Secondary | ICD-10-CM | POA: Insufficient documentation

## 2023-06-12 DIAGNOSIS — K449 Diaphragmatic hernia without obstruction or gangrene: Secondary | ICD-10-CM | POA: Diagnosis not present

## 2023-06-12 DIAGNOSIS — S199XXA Unspecified injury of neck, initial encounter: Secondary | ICD-10-CM | POA: Diagnosis not present

## 2023-06-12 DIAGNOSIS — I10 Essential (primary) hypertension: Secondary | ICD-10-CM | POA: Diagnosis not present

## 2023-06-12 DIAGNOSIS — M542 Cervicalgia: Secondary | ICD-10-CM | POA: Diagnosis not present

## 2023-06-12 MED ORDER — LIDOCAINE-EPINEPHRINE (PF) 2 %-1:200000 IJ SOLN
10.0000 mL | Freq: Once | INTRAMUSCULAR | Status: AC
  Filled 2023-06-12: qty 20

## 2023-06-12 MED ORDER — LIDOCAINE-EPINEPHRINE (PF) 2 %-1:200000 IJ SOLN
INTRAMUSCULAR | Status: AC
  Administered 2023-06-12: 10 mL
  Filled 2023-06-12: qty 20

## 2023-06-12 MED ORDER — TETANUS-DIPHTH-ACELL PERTUSSIS 5-2.5-18.5 LF-MCG/0.5 IM SUSY
0.5000 mL | PREFILLED_SYRINGE | Freq: Once | INTRAMUSCULAR | Status: AC
  Administered 2023-06-12: 0.5 mL via INTRAMUSCULAR
  Filled 2023-06-12: qty 0.5

## 2023-06-12 NOTE — ED Notes (Signed)
Pt ambulatory to restroom with independent steady gait

## 2023-06-12 NOTE — ED Triage Notes (Signed)
Pt to ED [facility] while taking demented husband to doctor, fell trying to get husband up a curb. Hit back of head, denies LOC. Denies blood thinners.

## 2023-06-12 NOTE — ED Notes (Signed)
Pt verbalized understanding of d/c instructions and follow up care. Pt wheeled out of ED via wheelchair. ?

## 2023-06-12 NOTE — ED Provider Notes (Signed)
[facility] Provider Note    Event Date/Time   First MD Initiated Contact with Patient 06/12/23 1842     (approximate)   History   Fall   HPI  Claire Kane is a 82 y.o. female who presents to the ER from outpatient clinic after mechanical fall.  She was stepping over a curb helping her husband when she fell back hitting her head.  Complaining of neck pain back of her head discomfort with some bleeding and laceration as well as some right rib pain.  No LOC.     Physical Exam   Triage Vital Signs: ED Triage Vitals [06/12/23 1623]  Encounter Vitals Group     BP (!) 150/98     Systolic BP Percentile      Diastolic BP Percentile      Pulse Rate 77     Resp 18     Temp 97.9 F (36.6 C)     Temp src      SpO2 99 %     Weight 170 lb (77.1 kg)     Height 5' 2.5" (1.588 m)     Head Circumference      Peak Flow      Pain Score 7     Pain Loc      Pain Education      Exclude from Growth Chart     Most recent vital signs: Vitals:   06/12/23 1623  BP: (!) 150/98  Pulse: 77  Resp: 18  Temp: 97.9 F (36.6 C)  SpO2: 99%     Constitutional: Alert  Eyes: Conjunctivae are normal.  Head: Contusion with 2 cm laceration in the right posterior scalp. Nose: No congestion/rhinnorhea. Mouth/Throat: Mucous membranes are moist.   Neck: Painless ROM.  Cardiovascular:   Good peripheral circulation. Respiratory: Normal respiratory effort.  No retractions.  Gastrointestinal: Soft and nontender.  Musculoskeletal:  no deformity Neurologic:  MAE spontaneously. No gross focal neurologic deficits are appreciated.  Skin:  Skin is warm, dry and intact. No rash noted. Psychiatric: Mood and affect are normal. Speech and behavior are normal.    ED Results / Procedures / Treatments   Labs (all labs ordered are listed, but only abnormal results are displayed) Labs Reviewed - No data to display   EKG     RADIOLOGY Please see ED Course for my review and  interpretation.  I personally reviewed all radiographic images ordered to evaluate for the above acute complaints and reviewed radiology reports and findings.  These findings were personally discussed with the patient.  Please see medical record for radiology report.    PROCEDURES:  Critical Care performed: No  ..Laceration Repair  Date/Time: 06/12/2023 7:08 PM  Performed by: Willy Eddy, MD Authorized by: Willy Eddy, MD   Anesthesia:    Anesthesia method:  Local infiltration   Local anesthetic:  Lidocaine 1% WITH epi Laceration details:    Location:  Scalp   Scalp location:  R parietal   Length (cm):  2 Treatment:    Area cleansed with:  Chlorhexidine   Amount of cleaning:  Standard   Debridement:  None Skin repair:    Repair method:  Staples   Number of staples:  5 Repair type:    Repair type:  Simple Post-procedure details:    Dressing:  Open (no dressing)   Procedure completion:  Tolerated    MEDICATIONS ORDERED IN ED: Medications  lidocaine-EPINEPHrine (XYLOCAINE W/EPI) 2 %-1:200000 (PF) injection 10 mL (has  no administration in time range)  Tdap (BOOSTRIX) injection 0.5 mL (0.5 mLs Intramuscular Given 06/12/23 2040)     IMPRESSION / MDM / ASSESSMENT AND PLAN / ED COURSE  I reviewed the triage vital signs and the nursing notes.                              Differential diagnosis includes, but is not limited to, fracture, contusion, SDH, IPH, concussion  Patient presenting to the ER for evaluation of symptoms as described above.  Based on symptoms, risk factors and considered above differential, this presenting complaint could reflect a potentially life-threatening illness therefore CT imaging as well as radiographs will be ordered for the above differential.    Clinical Course as of 06/12/23 2125  [facility] Jun 12, 2023  1952 Chest x-ray on my review and interpretation shows evidence of hiatal hernia without evidence of pneumothorax.  Similar  appearance to previous imaging. [PR]  2121 Imaging is reassuring.  Patient remains well and nontoxic.  Discussed strict return precautions.  Despair appropriate for outpatient follow-up. [PR]    Clinical Course User Index [PR] Willy Eddy, MD     FINAL CLINICAL IMPRESSION(S) / ED DIAGNOSES   Final diagnoses:  Minor head injury, initial encounter  Laceration of scalp, initial encounter     Rx / DC Orders   ED Discharge Orders     None        Note:  This document was prepared using Dragon voice recognition software and may include unintentional dictation errors.    Willy Eddy, MD 06/12/23 2125

## 2023-06-12 NOTE — ED Notes (Signed)
Pt at xray

## 2023-06-20 ENCOUNTER — Encounter: Payer: Self-pay | Admitting: Family Medicine

## 2023-06-20 ENCOUNTER — Ambulatory Visit: Payer: Medicare PPO | Admitting: Family Medicine

## 2023-06-20 VITALS — BP 133/59 | HR 63 | Ht 62.5 in | Wt 157.5 lb

## 2023-06-20 DIAGNOSIS — Z23 Encounter for immunization: Secondary | ICD-10-CM | POA: Diagnosis not present

## 2023-06-20 DIAGNOSIS — K219 Gastro-esophageal reflux disease without esophagitis: Secondary | ICD-10-CM | POA: Diagnosis not present

## 2023-06-20 DIAGNOSIS — S0990XD Unspecified injury of head, subsequent encounter: Secondary | ICD-10-CM

## 2023-06-20 DIAGNOSIS — G629 Polyneuropathy, unspecified: Secondary | ICD-10-CM | POA: Diagnosis not present

## 2023-06-20 NOTE — Progress Notes (Addendum)
Established patient visit   Patient: Claire Kane   DOB: 05/28/1941   82 y.o. Female  MRN: [id number] Visit Date: 06/20/2023  Today's healthcare provider: Ronnald Ramp, MD   Chief Complaint  Patient presents with   Medical Management of Chronic Issues    Staple removal of the scalp, right side, would like to discuss digestion issues    Subjective     HPI     Medical Management of Chronic Issues    Additional comments: Staple removal of the scalp, right side, would like to discuss digestion issues       Last edited by Rolly Salter, CMA on 06/20/2023  4:46 PM.       Discussed the use of AI scribe software for clinical note transcription with the patient, who gave verbal consent to proceed.  History of Present Illness   The patient, with a history of hiatal hernia and carpal tunnel syndrome, presents with concerns about worsening digestive issues. She reports episodes of overeating followed by discomfort, which is relieved by burping. However, recent episodes have escalated to include vomiting, which has occurred three to four times in the past few days. The patient notes that vomiting provides relief from the discomfort. She has previously been treated with double doses of omeprazole for these symptoms.  The patient also reports a history of anemia, for which she received iron infusions and took iron tablets. She mentions a significant weight loss of thirty pounds after being put on a 5 mL diet by her doctor. She expresses a desire to lose more weight and plans to stick to smaller meal portions to manage her digestive issues.  In addition to the digestive issues, the patient reports numbness in her hands and feet. She has been diagnosed with carpal tunnel syndrome and has received cortisone shots for the same, which have provided significant relief. She has also been prescribed gabapentin, which she discontinued due to a lack of noticeable difference in her  symptoms. She is scheduled for a nerve conduction test to further investigate these symptoms.  The patient also mentions a recent fall resulting in a head injury, which required staples. The staples have since been removed and the wound appears to be healing well. She also mentions a history of lumpectomy for breast cancer and is currently under the care of a breast surgeon for follow-up mammograms.  The patient's husband has been diagnosed with vascular dementia, which has been progressively worsening. This has added to the patient's stress and has influenced her decision to seek further treatment for her own health issues.         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    Breast cancer (HCC) 09/2020   left breast   Cataract    GERD (gastroesophageal reflux disease)    Heart murmur 2001   Hiatal hernia    High cholesterol    Hypertension    patient denies anyone telling her that she has htn   Personal history of radiation therapy 2002, 2022   BREAST CA   Rosacea    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 complications     Medications: Outpatient Medications Prior to Visit  Medication Sig   amLODipine (NORVASC) 5  MG tablet TAKE 1 TABLET EVERY DAY   atorvastatin (LIPITOR) 10 MG tablet TAKE 1 TABLET EVERY DAY   calcium citrate (CALCITRATE - DOSED IN MG ELEMENTAL CALCIUM) 950 MG tablet Take 200 mg of elemental calcium by mouth 2 (two) times daily.    clotrimazole-betamethasone (LOTRISONE) cream Apply 1 Application topically 2 (two) times daily.   cycloSPORINE (RESTASIS) 0.05 % ophthalmic emulsion Place 1 drop into both eyes 2 (two) times daily.   docusate sodium (COLACE) 100 MG capsule Take 100 mg by mouth daily as needed for mild constipation.   doxycycline (PERIOSTAT) 20 MG tablet    exemestane (AROMASIN)  25 MG tablet TAKE 1 TABLET EVERY DAY AFTER BREAKFAST   ezetimibe (ZETIA) 10 MG tablet TAKE 1 TABLET EVERY DAY   gabapentin (NEURONTIN) 100 MG capsule Take 1 capsule (100 mg total) by mouth at bedtime.   Glucosamine 750 MG TABS Take 750 mg by mouth daily.   Glycerin-Polysorbate 80 (REFRESH DRY EYE THERAPY OP) Place 1 drop into both eyes 2 (two) times daily.   Multiple Vitamins-Minerals (MULTIVITAMIN WITH MINERALS) tablet Take 1 tablet by mouth daily.   omeprazole (PRILOSEC) 40 MG capsule Take 1 capsule (40 mg total) by mouth daily.   psyllium (REGULOID) 0.52 G capsule Take 2 capsules by mouth daily.   simethicone (MYLICON) 80 MG chewable tablet Chew 80 mg by mouth every 6 (six) hours as needed for flatulence.   solifenacin (VESICARE) 10 MG tablet Take 1 tablet (10 mg total) by mouth daily.   SOOLANTRA 1 % CREA Apply 1 application  topically daily as needed (rosacea).   spironolactone (ALDACTONE) 50 MG tablet Take 50 mg by mouth daily.   [DISCONTINUED] traMADol (ULTRAM) 50 MG tablet TAKE 1 TABLET EVERY 12 HOURS AS NEEDED FOR SEVERE PAIN   Facility-Administered Medications Prior to Visit  Medication Dose Route Frequency Provider   0.9 %  sodium chloride infusion   Intravenous Once [name], MD    Review of Systems      Objective    BP (!) 133/59 (BP Location: Left Arm, Patient Position: Sitting, Cuff Size: Large)   Pulse 63   Ht 5' 2.5" (1.588 m)   Wt 157 lb 8 oz (71.4 kg)   SpO2 100%   BMI 28.35 kg/m     Physical Exam  Physical Exam   HEENT: Left ear clear, no wax or signs of infection. Right ear shows TM effusion, SKIN: Healed area on scalp, no discharge, no bleeding from healed scalp wound s/p staple removal       No results found for any visits on 06/20/23.  Assessment & Plan     Problem List Items Addressed This Visit     Acid reflux   Neuropathy (Chronic)   Other Visit Diagnoses     Scalp injury, subsequent encounter    -  Primary   Flu vaccine need        Relevant Orders   Flu Vaccine Trivalent High Dose (Fluad) (Completed)          Hiatal Hernia Patient reports episodes of overeating leading to discomfort, burping, and vomiting. Discussed surgical intervention as a potential treatment option, but patient has concerns about leaving her husband with vascular dementia alone. -Consult with Dr. Tobi Bastos regarding medical management options. -Consider referral to cardiothoracic surgery for surgical consultation if medical management is not effective. -Advise patient to continue with small, frequent meals.  Carpal Tunnel Syndrome Patient reports numbness in hands, improved with cortisone shot. -Continue current  management with hand brace and cortisone shots as needed. -Consider surgical intervention if symptoms persist or worsen.  Hearing Concerns Patient reports difference in hearing between right and left ear. Examination revealed possible fluid behind the ear, but no signs of infection. -Recommend use of nasal spray such as Flonase or Astepro to help clear any congestion and fluid.         No follow-ups on file.         Ronnald Ramp, MD  [facility] [phone] (phone) [phone] (fax)  [facility]

## 2023-06-21 ENCOUNTER — Other Ambulatory Visit: Payer: Self-pay | Admitting: Family Medicine

## 2023-06-21 NOTE — Telephone Encounter (Signed)
Medication Refill - Medication: traMADol (ULTRAM) 50 MG tablet  Has the patient contacted their pharmacy? No.    Preferred Pharmacy (with phone number or street name):  [facility] [address] [place] - [address]  [address] [place] [address]  Phone: [phone] Fax: [phone]     Has the patient been seen for an appointment in the last year OR does the patient have an upcoming appointment? Yes.    Agent: Please be advised that RX refills may take up to 3 business days. We ask that you follow-up with your pharmacy.

## 2023-06-22 MED ORDER — TRAMADOL HCL 50 MG PO TABS: 50.0000 mg | ORAL_TABLET | Freq: Two times a day (BID) | ORAL | 0 refills | Status: DC | PRN

## 2023-06-22 NOTE — Telephone Encounter (Signed)
Requested medication (s) are due for refill today: Yes  Requested medication (s) are on the active medication list: Yes  Last refill:  03/01/23  Future visit scheduled: Yes  Notes to clinic:  Not delegated.    Requested Prescriptions  Pending Prescriptions Disp Refills   traMADol (ULTRAM) 50 MG tablet 60 tablet 0     Not Delegated - Analgesics:  Opioid Agonists Failed - 06/21/2023  4:17 PM      Failed - This refill cannot be delegated      Failed - Urine Drug Screen completed in last 360 days      Passed - Valid encounter within last 3 months    Recent Outpatient Visits           2 days ago Flu vaccine need   [facility] Simmons-Robinson, Tawanna Cooler, MD   3 months ago Osteopenia, unspecified location   [facility] Simmons-Robinson, [place], MD   9 months ago Acute non-recurrent maxillary sinusitis   [facility] Jacky Kindle, FNP   9 months ago Other hyperlipidemia   [facility] Simmons-Robinson, [place], MD   1 year ago Gastroesophageal reflux disease, unspecified whether esophagitis present   [facility] Bosie Clos, MD       Future Appointments             In 3 months Simmons-Robinson, Tawanna Cooler, MD [facility]

## 2023-06-29 DIAGNOSIS — G5603 Carpal tunnel syndrome, bilateral upper limbs: Secondary | ICD-10-CM | POA: Diagnosis not present

## 2023-07-04 DIAGNOSIS — G5601 Carpal tunnel syndrome, right upper limb: Secondary | ICD-10-CM | POA: Diagnosis not present

## 2023-07-04 DIAGNOSIS — G5603 Carpal tunnel syndrome, bilateral upper limbs: Secondary | ICD-10-CM | POA: Diagnosis not present

## 2023-07-05 ENCOUNTER — Ambulatory Visit: Payer: Medicare PPO | Admitting: Family Medicine

## 2023-07-16 ENCOUNTER — Other Ambulatory Visit: Payer: Self-pay | Admitting: Gastroenterology

## 2023-07-19 DIAGNOSIS — D2262 Melanocytic nevi of left upper limb, including shoulder: Secondary | ICD-10-CM | POA: Diagnosis not present

## 2023-07-19 DIAGNOSIS — L821 Other seborrheic keratosis: Secondary | ICD-10-CM | POA: Diagnosis not present

## 2023-07-19 DIAGNOSIS — D2272 Melanocytic nevi of left lower limb, including hip: Secondary | ICD-10-CM | POA: Diagnosis not present

## 2023-07-19 DIAGNOSIS — L608 Other nail disorders: Secondary | ICD-10-CM | POA: Diagnosis not present

## 2023-07-19 DIAGNOSIS — D2261 Melanocytic nevi of right upper limb, including shoulder: Secondary | ICD-10-CM | POA: Diagnosis not present

## 2023-07-19 DIAGNOSIS — D2271 Melanocytic nevi of right lower limb, including hip: Secondary | ICD-10-CM | POA: Diagnosis not present

## 2023-07-19 DIAGNOSIS — L720 Epidermal cyst: Secondary | ICD-10-CM | POA: Diagnosis not present

## 2023-07-19 DIAGNOSIS — D225 Melanocytic nevi of trunk: Secondary | ICD-10-CM | POA: Diagnosis not present

## 2023-07-19 DIAGNOSIS — L689 Hypertrichosis, unspecified: Secondary | ICD-10-CM | POA: Diagnosis not present

## 2023-07-25 ENCOUNTER — Encounter: Payer: Self-pay | Admitting: Obstetrics & Gynecology

## 2023-07-25 ENCOUNTER — Ambulatory Visit: Payer: Medicare PPO | Admitting: Obstetrics & Gynecology

## 2023-07-25 VITALS — BP 149/74 | HR 75 | Wt 159.7 lb

## 2023-07-25 DIAGNOSIS — Z01419 Encounter for gynecological examination (general) (routine) without abnormal findings: Secondary | ICD-10-CM | POA: Diagnosis not present

## 2023-07-25 DIAGNOSIS — Z4689 Encounter for fitting and adjustment of other specified devices: Secondary | ICD-10-CM

## 2023-07-25 DIAGNOSIS — N993 Prolapse of vaginal vault after hysterectomy: Secondary | ICD-10-CM

## 2023-07-25 NOTE — Progress Notes (Signed)
ANNUAL PREVENTATIVE CARE GYNECOLOGY  ENCOUNTER NOTE  Subjective:       Claire Kane is a 82 y.o. married G2P2002here for a routine annual gynecologic exam. The patient is not currently sexually active. The patient is not taking hormone replacement therapy. Patient denies post-menopausal vaginal bleeding. The patient wears seatbelts: yes. The patient participates in regular exercise: no. Has the patient ever been transfused or tattooed?: no. The patient reports that there is not domestic violence in her life.  She manages her own pessary, removing and cleaning every month.   Gynecologic History No LMP recorded. Patient has had a hysterectomy.  Last mammogram: 08/2022.     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    Breast cancer (HCC) 09/2020   left breast   Cataract    GERD (gastroesophageal reflux disease)    Heart murmur 2001   Hiatal hernia    High cholesterol    Hypertension    patient denies anyone telling her that she has htn   Personal history of radiation therapy 2002, 2022   BREAST CA   Rosacea    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 complications     Family History  Problem Relation Age of Onset   Heart attack Mother    Heart attack Father    Heart attack Sister    Stroke Sister    Heart attack Brother    Other Brother        cerebral hemorrhage, sepsis   Breast cancer Cousin 50       breast/maternal   Cancer Cousin 60       breast/maternal   Breast cancer Cousin    Breast  cancer Maternal Aunt 56   Breast cancer Other 48       maternal neice    Past Surgical History:  Procedure Laterality Date   ABDOMINAL HYSTERECTOMY     BLEPHAROPLASTY     BREAST BIOPSY Left 09/15/2020   affirm bx, x marker, DCIS   BREAST BIOPSY Left 09/07/2022   Left Breast Stereo Bx X clip- path pending   BREAST BIOPSY Left 09/07/2022   MM LT BREAST BX W LOC DEV 1ST LESION IMAGE BX SPEC STEREO GUIDE 09/07/2022 [facility]   BREAST CYST ASPIRATION Left 2003   BREAST EXCISIONAL BIOPSY Right 2002   POS   BREAST LUMPECTOMY Right 2002   w/ radiation   BREAST LUMPECTOMY Left 10/07/2020   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   BREAST LUMPECTOMY Left    re-excision   BREAST LUMPECTOMY WITH NEEDLE LOCALIZATION Left 10/07/2020   Procedure: BREAST LUMPECTOMY WITH NEEDLE LOCALIZATION;  Surgeon:  Earline Mayotte, MD;  Location: [facility] ORS;  Service: General;  Laterality: Left;   BREAST SURGERY Right 2002   lumpectomy   CATARACT EXTRACTION W/ INTRAOCULAR LENS  IMPLANT, BILATERAL     CHOLECYSTECTOMY     COLONOSCOPY  2013   COLONOSCOPY WITH PROPOFOL N/A 12/08/2015   Procedure: COLONOSCOPY WITH PROPOFOL;  Surgeon: Kieth Brightly, MD;  Location: [facility] ENDOSCOPY;  Service: Endoscopy;  Laterality: N/A;   COLONOSCOPY WITH PROPOFOL N/A 12/06/2017   Procedure: COLONOSCOPY WITH PROPOFOL;  Surgeon: Wyline Mood, MD;  Location: [facility] ENDOSCOPY;  Service: Gastroenterology;  Laterality: N/A;   COLONOSCOPY WITH PROPOFOL N/A 12/22/2017   Procedure: COLONOSCOPY WITH PROPOFOL;  Surgeon: Wyline Mood, MD;  Location: [facility] ENDOSCOPY;  Service: Gastroenterology;  Laterality: N/A;   ESOPHAGOGASTRODUODENOSCOPY (EGD) WITH PROPOFOL N/A 12/08/2015   Procedure: ESOPHAGOGASTRODUODENOSCOPY (EGD) WITH PROPOFOL;  Surgeon: Kieth Brightly, MD;  Location: [facility] ENDOSCOPY;  Service: Endoscopy;  Laterality: N/A;   ESOPHAGOGASTRODUODENOSCOPY  (EGD) WITH PROPOFOL N/A 12/06/2017   Procedure: ESOPHAGOGASTRODUODENOSCOPY (EGD) WITH PROPOFOL;  Surgeon: Wyline Mood, MD;  Location: [facility] ENDOSCOPY;  Service: Gastroenterology;  Laterality: N/A;   ESOPHAGOGASTRODUODENOSCOPY (EGD) WITH PROPOFOL N/A 12/22/2017   Procedure: ESOPHAGOGASTRODUODENOSCOPY (EGD) WITH PROPOFOL;  Surgeon: Wyline Mood, MD;  Location: [facility] ENDOSCOPY;  Service: Gastroenterology;  Laterality: N/A;   EYE SURGERY Right 2014   EYE SURGERY Left 2014   GIVENS CAPSULE STUDY N/A 01/23/2018   Procedure: GIVENS CAPSULE STUDY;  Surgeon: Wyline Mood, MD;  Location: [facility] ENDOSCOPY;  Service: Gastroenterology;  Laterality: N/A;   iron infusion     had 5 iron infusions for anemia   post radiation therapy Right    right breast cancer 2002   RE-EXCISION OF BREAST CANCER,SUPERIOR MARGINS Left 11/09/2020   Procedure: RE-EXCISION OF BREAST CANCER,SUPERIOR MARGINS;  Surgeon: Earline Mayotte, MD;  Location: [facility] ORS;  Service: General;  Laterality: Left;   Stab pheblectomy   2010   vein closure procedure Bilateral 2008    Social History   Socioeconomic History   Marital status: Married    Spouse name: Peyton Najjar   Number of children: 2   Years of education: Not on file   Highest education level: Bachelor's degree (e.g., BA, AB, BS)  Occupational History   Occupation: Runner, broadcasting/film/video    Comment: retired  Tobacco Use   Smoking status: Never   Smokeless tobacco: Never  Vaping Use   Vaping status: Never Used  Substance and Sexual Activity   Alcohol use: No   Drug use: No   Sexual activity: Never  Other Topics Concern   Not on file  Social History Narrative   Patient lives with husband. She feels safe in her home.   Social Determinants of Health   Financial Resource Strain: Low Risk  (02/01/2023)   Overall Financial Resource Strain (CARDIA)    Difficulty of Paying Living Expenses: Not hard at all  Food Insecurity: No Food Insecurity (02/01/2023)   Hunger Vital Sign    Worried About  Running Out of Food in the Last Year: Never true    Ran Out of Food in the Last Year: Never true  Transportation Needs: No Transportation Needs (02/01/2023)   PRAPARE - Administrator, Civil Service (Medical): No    Lack of Transportation (Non-Medical): No  Physical Activity: Inactive (02/01/2023)   Exercise Vital Sign    Days of Exercise per Week: 0 days    Minutes of Exercise per Session: 0 min  Stress: No Stress Concern Present (02/01/2023)  [employer] of Occupational Health - Occupational Stress Questionnaire    Feeling of Stress : Not at all  Social Connections: Moderately Isolated (02/01/2023)   Social Connection and Isolation Panel [NHANES]    Frequency of Communication with Friends and Family: More than three times a week    Frequency of Social Gatherings with Friends and Family: Once a week    Attends Religious Services: Never    Database administrator or Organizations: No    Attends Banker Meetings: Never    Marital Status: Married  Catering manager Violence: Not At Risk (02/01/2023)   Humiliation, Afraid, Rape, and Kick questionnaire    Fear of Current or Ex-Partner: No    Emotionally Abused: No    Physically Abused: No    Sexually Abused: No    Current Outpatient Medications on File Prior to Visit  Medication Sig Dispense Refill   amLODipine (NORVASC) 5 MG tablet TAKE 1 TABLET EVERY DAY 90 tablet 3   atorvastatin (LIPITOR) 10 MG tablet TAKE 1 TABLET EVERY DAY 90 tablet 3   calcium citrate (CALCITRATE - DOSED IN MG ELEMENTAL CALCIUM) 950 MG tablet Take 200 mg of elemental calcium by mouth 2 (two) times daily.      clotrimazole-betamethasone (LOTRISONE) cream Apply 1 Application topically 2 (two) times daily. 30 g 6   cycloSPORINE (RESTASIS) 0.05 % ophthalmic emulsion Place 1 drop into both eyes 2 (two) times daily.     docusate sodium (COLACE) 100 MG capsule Take 100 mg by mouth daily as needed for mild constipation.     doxycycline  (PERIOSTAT) 20 MG tablet      exemestane (AROMASIN) 25 MG tablet TAKE 1 TABLET EVERY DAY AFTER BREAKFAST 90 tablet 3   ezetimibe (ZETIA) 10 MG tablet TAKE 1 TABLET EVERY DAY 90 tablet 3   gabapentin (NEURONTIN) 100 MG capsule Take 1 capsule (100 mg total) by mouth at bedtime. 90 capsule 1   Glucosamine 750 MG TABS Take 750 mg by mouth daily.     Glycerin-Polysorbate 80 (REFRESH DRY EYE THERAPY OP) Place 1 drop into both eyes 2 (two) times daily.     Multiple Vitamins-Minerals (MULTIVITAMIN WITH MINERALS) tablet Take 1 tablet by mouth daily.     omeprazole (PRILOSEC) 20 MG capsule TAKE 1 CAPSULE BY MOUTH EVERY DAY 90 capsule 3   omeprazole (PRILOSEC) 40 MG capsule Take 1 capsule (40 mg total) by mouth daily. 90 capsule 1   psyllium (REGULOID) 0.52 G capsule Take 2 capsules by mouth daily.     simethicone (MYLICON) 80 MG chewable tablet Chew 80 mg by mouth every 6 (six) hours as needed for flatulence.     solifenacin (VESICARE) 10 MG tablet Take 1 tablet (10 mg total) by mouth daily. 90 tablet 3   SOOLANTRA 1 % CREA Apply 1 application  topically daily as needed (rosacea).     spironolactone (ALDACTONE) 50 MG tablet Take 50 mg by mouth daily.     traMADol (ULTRAM) 50 MG tablet Take 1 tablet (50 mg total) by mouth every 12 (twelve) hours as needed. 60 tablet 0   Current Facility-Administered Medications on File Prior to Visit  Medication Dose Route Frequency Provider Last Rate Last Admin   0.9 %  sodium chloride infusion   Intravenous Once [name], MD        Allergies  Allergen Reactions   Zometa [Zoledronic Acid]     Severe back pain      Review of Systems ROS  Review of Systems - General ROS: negative for - chills, fatigue, fever, hot flashes, night sweats, weight gain or weight loss Psychological ROS: negative for - anxiety, decreased libido, depression, mood swings, physical abuse or sexual abuse Ophthalmic ROS: negative for - blurry vision, eye pain or loss of vision ENT ROS:  negative for - headaches, hearing change, visual changes or vocal changes Allergy and Immunology ROS: negative for - hives, itchy/watery eyes or seasonal allergies Hematological and Lymphatic ROS: negative for - bleeding problems, bruising, swollen lymph nodes or weight loss Endocrine ROS: negative for - galactorrhea, hair pattern changes, hot flashes, malaise/lethargy, mood swings, palpitations, polydipsia/polyuria, skin changes, temperature intolerance or unexpected weight changes Breast ROS: negative for - new or changing breast lumps or nipple discharge Respiratory ROS: negative for - cough or shortness of breath Cardiovascular ROS: negative for - chest pain, irregular heartbeat, palpitations or shortness of breath Gastrointestinal ROS: no abdominal pain, change in bowel habits, or black or bloody stools Genito-Urinary ROS: no dysuria, trouble voiding, or hematuria Musculoskeletal ROS: negative for - joint pain or joint stiffness Neurological ROS: negative for - bowel and bladder control changes Dermatological ROS: negative for rash and skin lesion changes   Objective:   BP (!) 149/74   Pulse 75   Wt 159 lb 11.2 oz (72.4 kg)   BMI 28.74 kg/m  CONSTITUTIONAL: Well-developed, well-nourished female in no acute distress.  PSYCHIATRIC: Normal mood and affect. Normal behavior. Normal judgment and thought content. NEUROLGIC: Alert and oriented to person, place, and time. Normal muscle tone coordination. No cranial nerve deficit noted. HENT:  Normocephalic, atraumatic, External right and left ear normal. Oropharynx is clear and moist EYES: Conjunctivae and EOM are normal. Pupils are equal, round, and reactive to light. No scleral icterus.  NECK: Normal range of motion, supple, no masses.  Normal thyroid.  SKIN: Skin is warm and dry. No rash noted. Not diaphoretic. No erythema. No pallor. CARDIOVASCULAR: Normal heart rate noted, regular rhythm, no murmur. RESPIRATORY: Clear to auscultation  bilaterally. Effort and breath sounds normal, no problems with respiration noted. BREASTS: Symmetric in size. No masses, skin changes, nipple drainage, or lymphadenopathy. ABDOMEN: Soft, normal bowel sounds, no distention noted.  No tenderness, rebound or guarding.  BLADDER: Normal PELVIC:  Bladder no bladder distension noted  Urethra: prominent  Vulva: VVA noted  Vagina: Speculum exam done after I removed her pessary. I noted a relatively shallow excoriation at the lower right hand quadrant of the vaginal cuff   Bimanual exam reveals no masses or pain MUSCULOSKELETAL: Normal range of motion. No tenderness.  No cyanosis, clubbing, or edema.  2+ distal pulses. LYMPHATIC: No Axillary, Supraclavicular, or Inguinal Adenopathy.   Labs: Lab Results  Component Value Date   WBC 7.5 03/22/2023   HGB 11.5 (L) 03/22/2023   HCT 35.2 (L) 03/22/2023   MCV 88.0 03/22/2023   PLT 326 03/22/2023    Lab Results  Component Value Date   CREATININE 0.81 03/22/2023   BUN 15 03/22/2023   NA 135 03/22/2023   K 4.3 03/22/2023   CL 101 03/22/2023   CO2 26 03/22/2023    Lab Results  Component Value Date   ALT 15 03/22/2023   AST 19 03/22/2023   ALKPHOS 38 03/22/2023   BILITOT 1.3 (H) 03/22/2023    Lab Results  Component Value Date   CHOL 140 03/08/2023   HDL 56 03/08/2023   LDLCALC 68 03/08/2023   TRIG 82 03/08/2023   CHOLHDL 2.5 03/08/2023  Lab Results  Component Value Date   TSH 1.830 03/08/2023    Lab Results  Component Value Date   HGBA1C 5.8 (H) 09/01/2022     Assessment:   Well woman exam Pessary maintainence  Plan:  She will leave her pessary out for a week and then replace it. I have encouraged her to clean it at least every 2 weeks, leaving it out overnight Return to Clinic - 1 Year   Allie Bossier, MD  OB/GYN

## 2023-07-26 ENCOUNTER — Ambulatory Visit: Payer: Medicare PPO | Admitting: Physician Assistant

## 2023-07-26 ENCOUNTER — Telehealth: Payer: Self-pay | Admitting: Gastroenterology

## 2023-07-26 ENCOUNTER — Encounter: Payer: Self-pay | Admitting: Oncology

## 2023-07-26 VITALS — BP 145/77 | HR 101 | Temp 98.2°F | Ht 62.5 in | Wt 155.4 lb

## 2023-07-26 DIAGNOSIS — K449 Diaphragmatic hernia without obstruction or gangrene: Secondary | ICD-10-CM | POA: Diagnosis not present

## 2023-07-26 DIAGNOSIS — K920 Hematemesis: Secondary | ICD-10-CM

## 2023-07-26 DIAGNOSIS — K219 Gastro-esophageal reflux disease without esophagitis: Secondary | ICD-10-CM

## 2023-07-26 DIAGNOSIS — Z862 Personal history of diseases of the blood and blood-forming organs and certain disorders involving the immune mechanism: Secondary | ICD-10-CM

## 2023-07-26 DIAGNOSIS — K5900 Constipation, unspecified: Secondary | ICD-10-CM

## 2023-07-26 DIAGNOSIS — R112 Nausea with vomiting, unspecified: Secondary | ICD-10-CM | POA: Diagnosis not present

## 2023-07-26 DIAGNOSIS — K5901 Slow transit constipation: Secondary | ICD-10-CM

## 2023-07-26 DIAGNOSIS — D509 Iron deficiency anemia, unspecified: Secondary | ICD-10-CM

## 2023-07-26 MED ORDER — OMEPRAZOLE 40 MG PO CPDR: 40.0000 mg | DELAYED_RELEASE_CAPSULE | Freq: Every day | ORAL | 3 refills | Status: DC

## 2023-07-26 NOTE — Patient Instructions (Addendum)
Please start OTC MiraLAX powder, mix 1 capful in a drink every day to treat constipation.  I [facility] prescription [facility] mg [facility] [place] [facility].  Referral to  Surgical. Someone from their office will contact you to schedule an appointment.

## 2023-07-26 NOTE — Progress Notes (Signed)
Claire Amy, PA-C [address]  [address]  [place], [place] [address]  Main: [phone]  Fax: [phone]   Primary Care Physician: Ronnald Ramp, MD  Primary Gastroenterologist:  Claire Amy, PA-C / Dr. Wyline Mood    CC: Nausea and vomiting  HPI: Claire Kane is a 82 y.o. female is self-referred to evaluate intermittent episodes of nausea and vomiting.  Patient had acute episode of nausea and vomiting last night.  Vomited multiple times.  1 episode of mild hematemesis last night.  She attributes her symptoms to large hiatal hernia.  She ran out of omeprazole 40 mg 3 or 4 days ago.  Needs refill of medication.  She states she has episodes of vomiting every 2 to 4 weeks, attributed to her large hiatal hernia.  She also had 1 episode of vomiting blood 05/2023.  She has had some epigastric discomfort.  No abdominal pain today.  She denies melena, hematochezia, or coffee-ground emesis.  She admits to chronic constipation.  Occasionally takes OTC Colace stool softener, not consistent.  Small hard stools.  Previous cholecystectomy many years ago.  Patient has history of GERD and iron deficiency anemia.  She last saw Dr. Tobi Bastos for follow-up of GERD 04/2022.  Prilosec was increased from 40 mg once daily to twice daily dose.  She has an 8 cm large hiatal hernia.  Has received IV iron through Dr. Cathie Hoops.  History of gas and bloating.  Was started on low FODMAP diet and told to avoid artificial sugars and sweeteners.  Was feeling better.  GI workup for iron deficiency anemia June/2019: EGD showed large 8 cm hiatal hernia.  Colonoscopy showed internal hemorrhoids, otherwise normal.  Negative capsule endoscopy.  Labs 03/22/2023 showed mild decreased hemoglobin 11.5, MCV 88.   Current Outpatient Medications  Medication Sig Dispense Refill   amLODipine (NORVASC) 5 MG tablet TAKE 1 TABLET EVERY DAY 90 tablet 3   atorvastatin (LIPITOR) 10 MG tablet TAKE 1 TABLET EVERY DAY 90 tablet 3    calcium citrate (CALCITRATE - DOSED IN MG ELEMENTAL CALCIUM) 950 MG tablet Take 200 mg of elemental calcium by mouth 2 (two) times daily.      clotrimazole-betamethasone (LOTRISONE) cream Apply 1 Application topically 2 (two) times daily. 30 g 6   cycloSPORINE (RESTASIS) 0.05 % ophthalmic emulsion Place 1 drop into both eyes 2 (two) times daily.     docusate sodium (COLACE) 100 MG capsule Take 100 mg by mouth daily as needed for mild constipation.     doxycycline (PERIOSTAT) 20 MG tablet      exemestane (AROMASIN) 25 MG tablet TAKE 1 TABLET EVERY DAY AFTER BREAKFAST 90 tablet 3   ezetimibe (ZETIA) 10 MG tablet TAKE 1 TABLET EVERY DAY 90 tablet 3   gabapentin (NEURONTIN) 100 MG capsule Take 1 capsule (100 mg total) by mouth at bedtime. 90 capsule 1   Glucosamine 750 MG TABS Take 750 mg by mouth daily.     Glycerin-Polysorbate 80 (REFRESH DRY EYE THERAPY OP) Place 1 drop into both eyes 2 (two) times daily.     Multiple Vitamins-Minerals (MULTIVITAMIN WITH MINERALS) tablet Take 1 tablet by mouth daily.     psyllium (REGULOID) 0.52 G capsule Take 2 capsules by mouth daily.     simethicone (MYLICON) 80 MG chewable tablet Chew 80 mg by mouth every 6 (six) hours as needed for flatulence.     solifenacin (VESICARE) 10 MG tablet Take 1 tablet (10 mg total) by mouth daily. 90 tablet  3   SOOLANTRA 1 % CREA Apply 1 application  topically daily as needed (rosacea).     spironolactone (ALDACTONE) 50 MG tablet Take 50 mg by mouth daily.     traMADol (ULTRAM) 50 MG tablet Take 1 tablet (50 mg total) by mouth every 12 (twelve) hours as needed. 60 tablet 0   omeprazole (PRILOSEC) 40 MG capsule Take 1 capsule (40 mg total) by mouth daily. 90 capsule 3   No current facility-administered medications for this visit.   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/2023   Cataract    GERD (gastroesophageal reflux disease)    Heart murmur 2001   Hiatal hernia    High cholesterol    Hypertension    patient denies anyone telling her that she has htn   Malignant tumor of breast (HCC) 09/19/2020   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   Personal history of radiation therapy 2002, 2022   BREAST CA   Rosacea    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 complications     Past Surgical History:  Procedure Laterality Date   ABDOMINAL HYSTERECTOMY     BLEPHAROPLASTY     BREAST BIOPSY Left 09/15/2020   affirm bx, x marker, DCIS   BREAST BIOPSY Left 09/07/2022   Left Breast Stereo Bx X clip- path pending   BREAST BIOPSY Left 09/07/2022   MM LT BREAST BX W LOC DEV 1ST LESION IMAGE BX SPEC STEREO GUIDE 09/07/2022 [facility]   BREAST CYST ASPIRATION Left 2003   BREAST EXCISIONAL BIOPSY Right 2002   POS   BREAST LUMPECTOMY Right 2002   w/ radiation   BREAST LUMPECTOMY Left 10/07/2020   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   BREAST LUMPECTOMY Left    re-excision   BREAST LUMPECTOMY WITH NEEDLE LOCALIZATION Left 10/07/2020   Procedure: BREAST LUMPECTOMY WITH NEEDLE LOCALIZATION;  Surgeon: Earline Mayotte, MD;  Location: [facility];  Service: General;  Laterality: Left;   BREAST SURGERY Right 2002   lumpectomy   CATARACT EXTRACTION W/ INTRAOCULAR LENS  IMPLANT, BILATERAL     CHOLECYSTECTOMY     COLONOSCOPY  2013    COLONOSCOPY WITH PROPOFOL N/A 12/08/2015   Procedure: COLONOSCOPY WITH PROPOFOL;  Surgeon: Kieth Brightly, MD;  Location: [facility];  Service: Endoscopy;  Laterality: N/A;   COLONOSCOPY WITH PROPOFOL N/A 12/06/2017   Procedure: COLONOSCOPY WITH PROPOFOL;  Surgeon: Wyline Mood, MD;  Location: [facility] ENDOSCOPY;  Service: Gastroenterology;  Laterality: N/A;   COLONOSCOPY WITH PROPOFOL N/A 12/22/2017   Procedure: COLONOSCOPY WITH PROPOFOL;  Surgeon: Wyline Mood, MD;  Location: [facility] ENDOSCOPY;  Service: Gastroenterology;  Laterality: N/A;   ESOPHAGOGASTRODUODENOSCOPY (EGD) WITH PROPOFOL N/A 12/08/2015   Procedure: ESOPHAGOGASTRODUODENOSCOPY (  EGD) WITH PROPOFOL;  Surgeon: Kieth Brightly, MD;  Location: [facility] ENDOSCOPY;  Service: Endoscopy;  Laterality: N/A;   ESOPHAGOGASTRODUODENOSCOPY (EGD) WITH PROPOFOL N/A 12/06/2017   Procedure: ESOPHAGOGASTRODUODENOSCOPY (EGD) WITH PROPOFOL;  Surgeon: Wyline Mood, MD;  Location: [facility] ENDOSCOPY;  Service: Gastroenterology;  Laterality: N/A;   ESOPHAGOGASTRODUODENOSCOPY (EGD) WITH PROPOFOL N/A 12/22/2017   Procedure: ESOPHAGOGASTRODUODENOSCOPY (EGD) WITH PROPOFOL;  Surgeon: Wyline Mood, MD;  Location: [facility] ENDOSCOPY;  Service: Gastroenterology;  Laterality: N/A;   EYE SURGERY Right 2014   EYE SURGERY Left 2014   GIVENS CAPSULE STUDY N/A 01/23/2018   Procedure: GIVENS CAPSULE STUDY;  Surgeon: Wyline Mood, MD;  Location: [facility] ENDOSCOPY;  Service: Gastroenterology;  Laterality: N/A;   iron infusion     had 5 iron infusions for anemia   post radiation therapy Right    right breast cancer 2002   RE-EXCISION OF BREAST CANCER,SUPERIOR MARGINS Left 11/09/2020   Procedure: RE-EXCISION OF BREAST CANCER,SUPERIOR MARGINS;  Surgeon: Earline Mayotte, MD;  Location: [facility] ORS;  Service: General;  Laterality: Left;   Stab pheblectomy   2010   vein closure procedure Bilateral 2008    Review of Systems:    All systems reviewed and negative except where noted  in HPI.   Physical Examination:   BP (!) 145/77   Pulse (!) 101   Temp 98.2 F (36.8 C)   Ht 5' 2.5" (1.588 m)   Wt 155 lb 6.4 oz (70.5 kg)   BMI 27.97 kg/m   General: Well-nourished, well-developed in no acute distress.  Lungs: Clear to auscultation bilaterally. Non-labored. Heart: Regular rate and rhythm, no murmurs rubs or gallops.  Abdomen: Bowel sounds are normal; Abdomen is Soft; No hepatosplenomegaly, masses or hernias; Mild epigastric discomfort, otherwise no Abdominal Tenderness; No guarding or rebound tenderness.  Large surgical scar RUQ from cholecystectomy many years ago.  Previous hysterectomy. Neuro: Alert and oriented x 3.  Grossly intact.  Psych: Alert and cooperative, normal mood and affect.   Imaging Studies: No results found.  Assessment and Plan:   LLEWELLYN SCHOENBERGER is a 82 y.o. y/o female presents for:  1.  Episodic nausea and vomiting; likely due to large hiatal hernia.  Labs: CBC, CMP, lipase  Restart omeprazole 40 Mg once daily.  GERD Diet.  2.  Hematemesis (2 mild episodes)  Scheduling EGD I discussed risks of EGD with patient to include risk of bleeding, perforation, and risk of sedation.  Patient expressed understanding and agrees to proceed with EGD.    3.  Large hiatal hernia  EGD  Refer to Dr. Everlene Farrier, general surgeon to discuss hiatal hernia repair.  4.  Constipation  Start OTC MiraLAX, mix 1 capful in a drink once daily every day.  5.  History of iron deficiency anemia  Labs: CBC, iron panel, ferritin.  Claire Amy, PA-C  Follow up 4 weeks after EGD with Dr. Tobi Bastos.

## 2023-07-26 NOTE — Telephone Encounter (Signed)
Patient called and left a voicemail wanting to schedule appointment with Dr. Tobi Bastos. She said that she been up throwing up all night long and there was a little bit of blood. I called patient back to let her know we received his message. She was throwing up every hour then she stop. She wanting to wait for the Dr.Anna and she schedule her for 09/05/23 at 3:15 pm. She want to cancel the appointment with Dr. Tobi Bastos for the 09/05/23 and schedule with the PA [facility]. Per Inetta Fermo I can add her to the 1:00 pm opening today.

## 2023-07-27 LAB — CBC WITH DIFFERENTIAL/PLATELET
Basophils Absolute: 0 10*3/uL (ref 0.0–0.2)
Basos: 0 %
EOS (ABSOLUTE): 0 10*3/uL (ref 0.0–0.4)
Eos: 0 %
Hematocrit: 37.8 % (ref 34.0–46.6)
Hemoglobin: 11.8 g/dL (ref 11.1–15.9)
Immature Grans (Abs): 0 10*3/uL (ref 0.0–0.1)
Immature Granulocytes: 0 %
Lymphocytes Absolute: 1.7 10*3/uL (ref 0.7–3.1)
Lymphs: 14 %
MCH: 27.6 pg (ref 26.6–33.0)
MCHC: 31.2 g/dL — ABNORMAL LOW (ref 31.5–35.7)
MCV: 89 fL (ref 79–97)
Monocytes Absolute: 0.8 10*3/uL (ref 0.1–0.9)
Monocytes: 7 %
Neutrophils Absolute: 9.3 10*3/uL — ABNORMAL HIGH (ref 1.4–7.0)
Neutrophils: 79 %
Platelets: 447 10*3/uL (ref 150–450)
RBC: 4.27 x10E6/uL (ref 3.77–5.28)
RDW: 13.7 % (ref 11.7–15.4)
WBC: 11.9 10*3/uL — ABNORMAL HIGH (ref 3.4–10.8)

## 2023-07-27 LAB — COMPREHENSIVE METABOLIC PANEL
ALT: 15 [IU]/L (ref 0–32)
AST: 16 [IU]/L (ref 0–40)
Albumin: 4.7 g/dL (ref 3.7–4.7)
Alkaline Phosphatase: 65 [IU]/L (ref 44–121)
BUN/Creatinine Ratio: 20 (ref 12–28)
BUN: 18 mg/dL (ref 8–27)
Bilirubin Total: 1.2 mg/dL (ref 0.0–1.2)
CO2: 24 mmol/L (ref 20–29)
Calcium: 10 mg/dL (ref 8.7–10.3)
Chloride: 103 mmol/L (ref 96–106)
Creatinine, Ser: 0.9 mg/dL (ref 0.57–1.00)
Globulin, Total: 2.6 g/dL (ref 1.5–4.5)
Glucose: 100 mg/dL — ABNORMAL HIGH (ref 70–99)
Potassium: 3.8 mmol/L (ref 3.5–5.2)
Sodium: 145 mmol/L — ABNORMAL HIGH (ref 134–144)
Total Protein: 7.3 g/dL (ref 6.0–8.5)
eGFR: 64 mL/min/{1.73_m2} (ref >59)

## 2023-07-27 LAB — LIPASE: Lipase: 50 U/L (ref 14–85)

## 2023-07-31 ENCOUNTER — Ambulatory Visit: Payer: Medicare PPO | Admitting: Surgery

## 2023-07-31 ENCOUNTER — Encounter: Payer: Self-pay | Admitting: Surgery

## 2023-07-31 VITALS — BP 162/75 | HR 84 | Temp 98.3°F | Ht 62.5 in | Wt 154.6 lb

## 2023-07-31 DIAGNOSIS — K2100 Gastro-esophageal reflux disease with esophagitis, without bleeding: Secondary | ICD-10-CM | POA: Diagnosis not present

## 2023-07-31 DIAGNOSIS — K449 Diaphragmatic hernia without obstruction or gangrene: Secondary | ICD-10-CM | POA: Diagnosis not present

## 2023-07-31 NOTE — Patient Instructions (Addendum)
Your CT and Barium Swallow is scheduled for 08/11/2023 10:30 am (arrive by 10 am) at [facility]. Nothing to eat or drink 3 hours prior.   See follow up appointment.   Hiatal Hernia  A hiatal hernia occurs when part of the stomach slides above the muscle that separates the abdomen from the chest (diaphragm). A person can be born with a hiatal hernia (congenital), or it may develop over time. In almost all cases of hiatal hernia, only the top part of the stomach pushes through the diaphragm. Many people have a hiatal hernia with no symptoms. The larger the hernia, the more likely it is that you will have symptoms. In some cases, a hiatal hernia allows stomach acid to flow back into the tube that carries food from your mouth to your stomach (esophagus). This may cause heartburn symptoms. The development of heartburn symptoms may mean that you have a condition called gastroesophageal reflux disease (GERD). What are the causes? This condition is caused by a weakness in the opening (hiatus) where the esophagus passes through the diaphragm to attach to the upper part of the stomach. A person may be born with a weakness in the hiatus, or a weakness can develop over time. What increases the risk? This condition is more likely to develop in: Older people. Age is a major risk factor for a hiatal hernia, especially if you are over the age of 33. Pregnant women. People who are overweight. People who have frequent constipation. What are the signs or symptoms? Symptoms of this condition usually develop in the form of GERD symptoms. Symptoms include: Heartburn. Upset stomach (indigestion). Trouble swallowing. Coughing or wheezing. Wheezing is making high-pitched whistling sounds when you breathe. Sore throat. Chest pain. Nausea and vomiting. How is this diagnosed? This condition may be diagnosed during testing for GERD. Tests that may be done include: X-rays of your stomach or chest. An upper  gastrointestinal (GI) series. This is an X-ray exam of your GI tract that is taken after you swallow a chalky liquid that shows up clearly on the X-ray. Endoscopy. This is a procedure to look into your stomach using a thin, flexible tube that has a tiny camera and light on the end of it. How is this treated? This condition may be treated by: Dietary and lifestyle changes to help reduce GERD symptoms. Medicines. These may include: Over-the-counter antacids. Medicines that make your stomach empty more quickly. Medicines that block the production of stomach acid (H2 blockers). Stronger medicines to reduce stomach acid (proton pump inhibitors). Surgery to repair the hernia, if other treatments are not helping. If you have no symptoms, you may not need treatment. Follow these instructions at home: Lifestyle and activity Do not use any products that contain nicotine or tobacco. These products include cigarettes, chewing tobacco, and vaping devices, such as e-cigarettes. If you need help quitting, ask your health care provider. Try to achieve and maintain a healthy body weight. Avoid putting pressure on your abdomen. Anything that puts pressure on your abdomen increases the amount of acid that may be pushed up into your esophagus. Avoid bending over, especially after eating. Raise the head of your bed by putting blocks under the legs. This keeps your head and esophagus higher than your stomach. Do not wear tight clothing around your chest or stomach. Try not to strain when having a bowel movement, when urinating, or when lifting heavy objects. Eating and drinking Avoid foods that can worsen GERD symptoms. These may include: Fatty  foods, like fried foods. Citrus fruits, like oranges or lemon. Other foods and drinks that contain acid, like orange juice or tomatoes. Spicy food. Chocolate. Eat frequent small meals instead of three large meals a day. This helps prevent your stomach from getting too  full. Eat slowly. Do not lie down right after eating. Do not eat 1-2 hours before bed. Do not drink beverages with caffeine. These include cola, coffee, cocoa, and tea. Do not drink alcohol. General instructions Take over-the-counter and prescription medicines only as told by your health care provider. Keep all follow-up visits. Your health care provider will want to check that any new prescribed medicines are helping your symptoms. Contact a health care provider if: Your symptoms are not controlled with medicines or lifestyle changes. You are having trouble swallowing. You have coughing or wheezing that will not go away. Your pain is getting worse. Your pain spreads to your arms, neck, jaw, teeth, or back. You feel nauseous or you vomit. Get help right away if: You have shortness of breath. You vomit blood. You have bright red blood in your stools. You have black, tarry stools. These symptoms may be an emergency. Get help right away. Call 911. Do not wait to see if the symptoms will go away. Do not drive yourself to the hospital. Summary A hiatal hernia occurs when part of the stomach slides above the muscle that separates the abdomen from the chest. A person may be born with a weakness in the hiatus, or a weakness can develop over time. Symptoms of a hiatal hernia may include heartburn, trouble swallowing, or sore throat. Management of a hiatal hernia includes eating frequent small meals instead of three large meals a day. Get help right away if you vomit blood, have bright red blood in your stools, or have black, tarry stools. This information is not intended to replace advice given to you by your health care provider. Make sure you discuss any questions you have with your health care provider. Document Revised: 11/02/2021 Document Reviewed: 11/02/2021 Elsevier Patient Education  2024 [employer].

## 2023-08-01 ENCOUNTER — Other Ambulatory Visit: Payer: Self-pay | Admitting: Family Medicine

## 2023-08-01 DIAGNOSIS — Z1231 Encounter for screening mammogram for malignant neoplasm of breast: Secondary | ICD-10-CM

## 2023-08-02 ENCOUNTER — Telehealth: Payer: Self-pay

## 2023-08-02 NOTE — Telephone Encounter (Signed)
Patient notified. Hi Mrs. Manson Passey, Your labs show Normal hemoglobin, kidney test, liver test, and pancreas test.  White count was slightly elevated, yet not worrisome.  Sodium was slightly elevated.  I recommend drink 64 ounces of water daily.  Continue with current plan for EGD as scheduled. Celso Amy, PA-C

## 2023-08-04 ENCOUNTER — Encounter: Payer: Self-pay | Admitting: Surgery

## 2023-08-04 NOTE — Progress Notes (Signed)
Patient ID: Claire Kane, female   DOB: 18-Jan-1941, 82 y.o.   MRN: [id number]  HPI Claire Kane is a 82 y.o. female in consultation at the request of Dr. Tobi Bastos for large paraesophageal hernia.  She reports that she has known this for several years.  More recently over the last year or so is becoming more symptomatic.  She does have episodes of regurgitation.  She does have some dysphagia for certain meals such as bread.  She does have a history of iron deficiency anemia and had a EGD in 2019.  I have personally reviewed the images showing evidence of a large hiatal hernia.  She does have some constipation.  He also reports early satiety. Her symptoms get worse when she lays down.  Does have some intermittent cough hemoglobin 11.5 Prior surgical history includes abdominal hysterectomy as well as ectomy of the breast for cancer He did have a recent x-ray that have personally reviewed showing evidence of a large paraesophageal hernia.   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    Breast cancer (HCC) 09/2020   left breast   Carpal tunnel syndrome of right wrist 06/06/2023   Cataract    GERD (gastroesophageal reflux disease)    Heart murmur 2001   Hiatal hernia    High cholesterol    Hypertension    patient denies anyone telling her that she has htn   Malignant tumor of breast (HCC) 09/19/2020   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   Personal history of radiation therapy 2002, 2022   BREAST CA   Rosacea    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 complications     Past Surgical History:  Procedure Laterality Date   ABDOMINAL HYSTERECTOMY     BLEPHAROPLASTY     BREAST BIOPSY Left 09/15/2020   affirm bx, x marker, DCIS   BREAST BIOPSY Left 09/07/2022   Left Breast Stereo Bx X  clip- path pending   BREAST BIOPSY Left 09/07/2022   MM LT BREAST BX W LOC DEV 1ST LESION IMAGE BX SPEC STEREO GUIDE 09/07/2022 [facility]   BREAST CYST ASPIRATION Left 2003   BREAST EXCISIONAL BIOPSY Right 2002   POS   BREAST LUMPECTOMY Right 2002   w/ radiation   BREAST LUMPECTOMY Left 10/07/2020   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   BREAST LUMPECTOMY Left    re-excision   BREAST LUMPECTOMY WITH NEEDLE LOCALIZATION Left 10/07/2020   Procedure: BREAST LUMPECTOMY WITH NEEDLE LOCALIZATION;  Surgeon: Earline Mayotte, MD;  Location: [facility];  Service: General;  Laterality: Left;   BREAST SURGERY Right 2002   lumpectomy   CATARACT EXTRACTION W/ INTRAOCULAR LENS  IMPLANT, BILATERAL     CHOLECYSTECTOMY     COLONOSCOPY  2013   COLONOSCOPY WITH PROPOFOL N/A 12/08/2015   Procedure: COLONOSCOPY WITH PROPOFOL;  Surgeon: Kieth Brightly, MD;  Location: [facility]OSCOPY;  Service: Endoscopy;  Laterality: N/A;   COLONOSCOPY WITH PROPOFOL N/A 12/06/2017   Procedure: COLONOSCOPY WITH PROPOFOL;  Surgeon: Wyline Mood, MD;  Location: [facility] ENDOSCOPY;  Service: Gastroenterology;  Laterality: N/A;   COLONOSCOPY WITH PROPOFOL N/A 12/22/2017   Procedure: COLONOSCOPY WITH PROPOFOL;  Surgeon: Wyline Mood, MD;  Location: [facility] ENDOSCOPY;  Service: Gastroenterology;  Laterality: N/A;   ESOPHAGOGASTRODUODENOSCOPY (EGD) WITH PROPOFOL N/A 12/08/2015   Procedure: ESOPHAGOGASTRODUODENOSCOPY (EGD) WITH PROPOFOL;  Surgeon: Kieth Brightly, MD;  Location: [facility] ENDOSCOPY;  Service: Endoscopy;  Laterality: N/A;   ESOPHAGOGASTRODUODENOSCOPY (EGD) WITH PROPOFOL N/A 12/06/2017   Procedure: ESOPHAGOGASTRODUODENOSCOPY (EGD) WITH PROPOFOL;  Surgeon: Wyline Mood, MD;  Location: [facility] ENDOSCOPY;  Service: Gastroenterology;  Laterality: N/A;   ESOPHAGOGASTRODUODENOSCOPY (EGD) WITH PROPOFOL N/A 12/22/2017   Procedure: ESOPHAGOGASTRODUODENOSCOPY  (EGD) WITH PROPOFOL;  Surgeon: Wyline Mood, MD;  Location: [facility] ENDOSCOPY;  Service: Gastroenterology;  Laterality: N/A;   EYE SURGERY Right 2014   EYE SURGERY Left 2014   GIVENS CAPSULE STUDY N/A 01/23/2018   Procedure: GIVENS CAPSULE STUDY;  Surgeon: Wyline Mood, MD;  Location: [facility] ENDOSCOPY;  Service: Gastroenterology;  Laterality: N/A;   iron infusion     had 5 iron infusions for anemia   post radiation therapy Right    right breast cancer 2002   RE-EXCISION OF BREAST CANCER,SUPERIOR MARGINS Left 11/09/2020   Procedure: RE-EXCISION OF BREAST CANCER,SUPERIOR MARGINS;  Surgeon: Earline Mayotte, MD;  Location: [facility];  Service: General;  Laterality: Left;   Stab pheblectomy   2010   vein closure procedure Bilateral 2008    Family History  Problem Relation Age of Onset   Heart attack Mother    Heart attack Father    Heart attack Sister    Stroke Sister    Heart attack Brother    Other Brother        cerebral hemorrhage, sepsis   Breast cancer Cousin 50       breast/maternal   Cancer Cousin 60       breast/maternal   Breast cancer Cousin    Breast cancer Maternal Aunt 51   Breast cancer Other 48       maternal neice    Social History Social History   Tobacco Use   Smoking status: Never   Smokeless tobacco: Never  Vaping Use   Vaping status: Never Used  Substance Use Topics   Alcohol use: No   Drug use: No    Allergies  Allergen Reactions   Zometa [Zoledronic Acid]     Severe back pain    Current Outpatient Medications  Medication Sig Dispense Refill   amLODipine (NORVASC) 5 MG tablet TAKE 1 TABLET EVERY DAY 90 tablet 3   atorvastatin (LIPITOR) 10 MG tablet TAKE 1 TABLET EVERY DAY 90 tablet 3   calcium citrate (CALCITRATE - DOSED IN MG ELEMENTAL CALCIUM) 950 MG tablet Take 200 mg of elemental calcium by mouth 2 (two) times daily.      clotrimazole-betamethasone (LOTRISONE) cream Apply 1 Application topically 2 (two) times daily. 30 g 6   cycloSPORINE  (RESTASIS) 0.05 % ophthalmic emulsion Place 1 drop into both eyes 2 (two) times daily.     docusate sodium (COLACE) 100 MG capsule Take 100 mg by mouth daily as needed for mild constipation.     doxycycline (PERIOSTAT) 20 MG tablet      exemestane (AROMASIN) 25 MG tablet TAKE 1 TABLET EVERY DAY AFTER BREAKFAST 90 tablet 3   ezetimibe (ZETIA) 10 MG tablet TAKE 1 TABLET EVERY DAY 90 tablet 3   gabapentin (NEURONTIN) 100 MG capsule Take 1 capsule (100 mg total) by mouth at bedtime. 90 capsule 1   Glucosamine 750 MG TABS Take 750 mg by mouth daily.     Glycerin-Polysorbate 80 (REFRESH DRY EYE  THERAPY OP) Place 1 drop into both eyes 2 (two) times daily.     Multiple Vitamins-Minerals (MULTIVITAMIN WITH MINERALS) tablet Take 1 tablet by mouth daily.     omeprazole (PRILOSEC) 40 MG capsule Take 1 capsule (40 mg total) by mouth daily. 90 capsule 3   psyllium (REGULOID) 0.52 G capsule Take 2 capsules by mouth daily.     simethicone (MYLICON) 80 MG chewable tablet Chew 80 mg by mouth every 6 (six) hours as needed for flatulence.     solifenacin (VESICARE) 10 MG tablet Take 1 tablet (10 mg total) by mouth daily. 90 tablet 3   SOOLANTRA 1 % CREA Apply 1 application  topically daily as needed (rosacea).     spironolactone (ALDACTONE) 50 MG tablet Take 50 mg by mouth daily.     traMADol (ULTRAM) 50 MG tablet Take 1 tablet (50 mg total) by mouth every 12 (twelve) hours as needed. 60 tablet 0   No current facility-administered medications for this visit.   Facility-Administered Medications Ordered in Other Visits  Medication Dose Route Frequency Provider Last Rate Last Admin   0.9 %  sodium chloride infusion   Intravenous Once [name], MD         Review of Systems Full ROS  was asked and was negative except for the information on the HPI  Physical Exam Blood pressure (!) 162/75, pulse 84, temperature 98.3 F (36.8 C), temperature source Oral, height 5' 2.5" (1.588 m), weight 154 lb 9.6 oz (70.1 kg),  SpO2 97%. CONSTITUTIONAL: NAD. EYES: Pupils are equal, round, Sclera are non-icteric. EARS, NOSE, MOUTH AND THROAT: The oral mucosa is pink and moist. Hearing is intact to voice. LYMPH NODES:  Lymph nodes in the neck are normal. RESPIRATORY:  Lungs are clear. There is normal respiratory effort, with equal breath sounds bilaterally, and without pathologic use of accessory muscles. CARDIOVASCULAR: Heart is regular without murmurs, gallops, or rubs. GI: The abdomen is  soft, nontender, and nondistended. There are no palpable masses. There is no hepatosplenomegaly. There are normal bowel sounds in all quadrants. GU: Rectal deferred.   MUSCULOSKELETAL: Normal muscle strength and tone. No cyanosis or edema.   SKIN: Turgor is good and there are no pathologic skin lesions or ulcers. NEUROLOGIC: Motor and sensation is grossly normal. Cranial nerves are grossly intact. PSYCH:  Oriented to person, place and time. Affect is normal.  Data Reviewed I have personally reviewed the patient's imaging, laboratory findings and medical records.    Assessment/Plan 82 year old female with evidence of a large paraesophageal hernia with significant symptoms.  Discussed with patient in detail about her disease process.  We definitely need to start further workup.  We will start with a CT scan of the abdomen pelvis as well as a barium swallow.  She will also need a repeat endoscopy to assess endoluminal anatomy.  DisCussed with the patient in detail about robotic paraesophageal hernia repair.  What this will entail.  The risks and benefits and the possible complications.  Also discussed with her about diet medication. Please note that I spent 60 minutes in this encounter including person reviewing extensive medical records, reviewing imaging studies, coordinating her care, placing orders and performing documentation   Sterling Big, MD FACS General Surgeon 08/04/2023, 2:00 PM

## 2023-08-11 ENCOUNTER — Ambulatory Visit
Admission: RE | Admit: 2023-08-11 | Discharge: 2023-08-11 | Disposition: A | Discharge status: Home / Self Care | Payer: Medicare PPO | Source: Ambulatory Visit | Attending: Surgery | Admitting: Surgery

## 2023-08-11 DIAGNOSIS — K449 Diaphragmatic hernia without obstruction or gangrene: Secondary | ICD-10-CM | POA: Insufficient documentation

## 2023-08-11 DIAGNOSIS — Z9049 Acquired absence of other specified parts of digestive tract: Secondary | ICD-10-CM | POA: Diagnosis not present

## 2023-08-11 DIAGNOSIS — K2100 Gastro-esophageal reflux disease with esophagitis, without bleeding: Secondary | ICD-10-CM | POA: Diagnosis not present

## 2023-08-11 DIAGNOSIS — K224 Dyskinesia of esophagus: Secondary | ICD-10-CM | POA: Diagnosis not present

## 2023-08-11 DIAGNOSIS — M47812 Spondylosis without myelopathy or radiculopathy, cervical region: Secondary | ICD-10-CM | POA: Diagnosis not present

## 2023-08-11 DIAGNOSIS — M4312 Spondylolisthesis, cervical region: Secondary | ICD-10-CM | POA: Diagnosis not present

## 2023-08-11 MED ORDER — IOHEXOL 300 MG/ML  SOLN
100.0000 mL | Freq: Once | INTRAMUSCULAR | Status: AC | PRN
  Administered 2023-08-11: 100 mL via INTRAVENOUS

## 2023-08-12 ENCOUNTER — Other Ambulatory Visit: Payer: Self-pay | Admitting: Family Medicine

## 2023-08-23 ENCOUNTER — Telehealth: Payer: Self-pay

## 2023-08-23 NOTE — Telephone Encounter (Signed)
-----   Message from [place] sent at 08/23/2023  1:38 PM EST ----- Please let her know scan confirmed large hiatal hernia but no other surprises ----- Message ----- From: Interface, Rad Results In Sent: 08/23/2023  12:55 PM EST To: Leafy Ro, MD

## 2023-08-23 NOTE — Telephone Encounter (Signed)
Notified patient as instructed, patient pleased. Discussed follow-up appointments, patient agrees  

## 2023-08-29 ENCOUNTER — Encounter: Payer: Self-pay | Admitting: Gastroenterology

## 2023-08-29 ENCOUNTER — Ambulatory Visit
Admission: RE | Admit: 2023-08-29 | Discharge: 2023-08-29 | Disposition: A | Discharge status: Home / Self Care | Payer: Medicare PPO | Source: Ambulatory Visit | Attending: Family Medicine | Admitting: Family Medicine

## 2023-08-29 DIAGNOSIS — Z1231 Encounter for screening mammogram for malignant neoplasm of breast: Secondary | ICD-10-CM | POA: Diagnosis not present

## 2023-08-30 ENCOUNTER — Ambulatory Visit: Payer: Medicare PPO | Admitting: Anesthesiology

## 2023-08-30 ENCOUNTER — Ambulatory Visit
Admission: RE | Admit: 2023-08-30 | Discharge: 2023-08-30 | Disposition: A | Discharge status: Home / Self Care | Payer: Medicare PPO | Attending: Gastroenterology | Admitting: Gastroenterology

## 2023-08-30 ENCOUNTER — Encounter
Admission: RE | Disposition: A | Discharge status: Home / Self Care | Payer: Self-pay | Source: Home / Self Care | Attending: Gastroenterology

## 2023-08-30 ENCOUNTER — Other Ambulatory Visit: Payer: Self-pay

## 2023-08-30 DIAGNOSIS — K920 Hematemesis: Secondary | ICD-10-CM | POA: Diagnosis not present

## 2023-08-30 DIAGNOSIS — K219 Gastro-esophageal reflux disease without esophagitis: Secondary | ICD-10-CM | POA: Diagnosis not present

## 2023-08-30 DIAGNOSIS — I10 Essential (primary) hypertension: Secondary | ICD-10-CM | POA: Diagnosis not present

## 2023-08-30 DIAGNOSIS — K449 Diaphragmatic hernia without obstruction or gangrene: Secondary | ICD-10-CM | POA: Insufficient documentation

## 2023-08-30 DIAGNOSIS — K921 Melena: Secondary | ICD-10-CM

## 2023-08-30 HISTORY — PX: ESOPHAGOGASTRODUODENOSCOPY (EGD) WITH PROPOFOL: SHX5813

## 2023-08-30 SURGERY — ESOPHAGOGASTRODUODENOSCOPY (EGD) WITH PROPOFOL
Anesthesia: General

## 2023-08-30 MED ORDER — LIDOCAINE HCL (CARDIAC) PF 100 MG/5ML IV SOSY
PREFILLED_SYRINGE | INTRAVENOUS | Status: DC | PRN
  Administered 2023-08-30: 100 mg via INTRAVENOUS

## 2023-08-30 MED ORDER — GLYCOPYRROLATE 0.2 MG/ML IJ SOLN
INTRAMUSCULAR | Status: DC | PRN
  Administered 2023-08-30: .2 mg via INTRAVENOUS

## 2023-08-30 MED ORDER — PROPOFOL 10 MG/ML IV BOLUS
INTRAVENOUS | Status: DC | PRN
  Administered 2023-08-30: 40 mg via INTRAVENOUS

## 2023-08-30 MED ORDER — SODIUM CHLORIDE 0.9 % IV SOLN: INTRAVENOUS | Status: DC

## 2023-08-30 MED ORDER — PROPOFOL 500 MG/50ML IV EMUL
INTRAVENOUS | Status: DC | PRN
  Administered 2023-08-30: 125 ug/kg/min via INTRAVENOUS

## 2023-08-30 NOTE — Transfer of Care (Signed)
Immediate Anesthesia Transfer of Care Note  Patient: Claire Kane  Procedure(s) Performed: ESOPHAGOGASTRODUODENOSCOPY (EGD) WITH PROPOFOL  Patient Location: Endoscopy Unit  Anesthesia Type:General  Level of Consciousness: awake, drowsy, and patient cooperative  Airway & Oxygen Therapy: Patient Spontanous Breathing and Patient connected to face mask oxygen  Post-op Assessment: Report given to RN and Post -op Vital signs reviewed and stable  Post vital signs: Reviewed and stable  Last Vitals:  Vitals Value Taken Time  BP 144/62 08/30/23 1051  Temp 36.4 C 08/30/23 1050  Pulse 79 08/30/23 1057  Resp 25 08/30/23 1057  SpO2 96 % 08/30/23 1057  Vitals shown include unfiled device data.  Last Pain:  Vitals:   08/30/23 1050  TempSrc: Temporal  PainSc: 0-No pain         Complications: No notable events documented.

## 2023-08-30 NOTE — Anesthesia Procedure Notes (Signed)
Procedure Name: General with mask airway Date/Time: 08/30/2023 10:45 AM  Performed by: Mohammed Kindle, CRNAPre-anesthesia Checklist: Patient identified, Emergency Drugs available, Suction available and Patient being monitored Patient Re-evaluated:Patient Re-evaluated prior to induction Oxygen Delivery Method: Simple face mask Induction Type: IV induction Placement Confirmation: positive ETCO2 and breath sounds checked- equal and bilateral Dental Injury: Teeth and Oropharynx as per pre-operative assessment

## 2023-08-30 NOTE — H&P (Signed)
Wyline Mood, MD [address], [address], [place], [place], [address] [address], [address], [place], [place], [address] Phone: [phone]  Fax: [phone]  Primary Care Physician:  Ronnald Ramp, MD   Pre-Procedure History & Physical: HPI:  Claire Kane is a 82 y.o. 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    Breast cancer (HCC) 09/2020   left breast   Carpal tunnel syndrome of right wrist 06/06/2023   Cataract    GERD (gastroesophageal reflux disease)    Heart murmur 2001   Hiatal hernia    High cholesterol    Hypertension    patient denies anyone telling her that she has htn   Malignant tumor of breast (HCC) 09/19/2020   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   Personal history of radiation therapy 2002, 2022   BREAST CA   Rosacea    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 complications     Past Surgical History:  Procedure Laterality Date   ABDOMINAL HYSTERECTOMY     BLEPHAROPLASTY     BREAST BIOPSY Left 09/15/2020   affirm bx, x marker, DCIS   BREAST BIOPSY Left 09/07/2022   Left Breast Stereo Bx X clip- path pending   BREAST BIOPSY Left 09/07/2022   MM LT BREAST BX W LOC DEV 1ST LESION IMAGE BX SPEC STEREO GUIDE 09/07/2022 [facility]   BREAST CYST ASPIRATION Left 2003   BREAST EXCISIONAL BIOPSY Right 2002   POS   BREAST LUMPECTOMY Right 2002   w/ radiation   BREAST LUMPECTOMY Left 10/07/2020   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   BREAST LUMPECTOMY Left    re-excision   BREAST LUMPECTOMY WITH NEEDLE LOCALIZATION Left 10/07/2020   Procedure: BREAST LUMPECTOMY WITH NEEDLE LOCALIZATION;  Surgeon: Earline Mayotte, MD;   Location: [facility];  Service: General;  Laterality: Left;   BREAST SURGERY Right 2002   lumpectomy   CATARACT EXTRACTION W/ INTRAOCULAR LENS  IMPLANT, BILATERAL     CHOLECYSTECTOMY     COLONOSCOPY  2013   COLONOSCOPY WITH PROPOFOL N/A 12/08/2015   Procedure: COLONOSCOPY WITH PROPOFOL;  Surgeon: Kieth Brightly, MD;  Location: [facility] END[facility];  Service: Endoscopy;  Laterality: N/A;   COLONOSCOPY WITH PROPOFOL N/A 12/06/2017   Procedure: COLONOSCOPY WITH PROPOFOL;  Surgeon: Wyline Mood, MD;  Location: [facility] ENDOSCOPY;  Service: Gastroenterology;  Laterality: N/A;   COLONOSCOPY WITH PROPOFOL N/A 12/22/2017   Procedure: COLONOSCOPY WITH PROPOFOL;  Surgeon: Wyline Mood, MD;  Location: [facility] ENDOSCOPY;  Service: Gastroenterology;  Laterality: N/A;   ESOPHAGOGASTRODUODENOSCOPY (EGD) WITH PROPOFOL N/A 12/08/2015   Procedure: ESOPHAGOGASTRODUODENOSCOPY (EGD) WITH PROPOFOL;  Surgeon: Kieth Brightly, MD;  Location: [facility];  Service: Endoscopy;  Laterality: N/A;   ESOPHAGOGASTRODUODENOSCOPY (EGD) WITH PROPOFOL N/A 12/06/2017   Procedure: ESOPHAGOGASTRODUODENOSCOPY (EGD) WITH PROPOFOL;  Surgeon: Wyline Mood, MD;  Location: [facility] ENDOSCOPY;  Service: Gastroenterology;  Laterality: N/A;   ESOPHAGOGASTRODUODENOSCOPY (EGD) WITH PROPOFOL N/A 12/22/2017   Procedure: ESOPHAGOGASTRODUODENOSCOPY (EGD) WITH PROPOFOL;  Surgeon: Wyline Mood, MD;  Location: [facility] ENDOSCOPY;  Service: Gastroenterology;  Laterality: N/A;   EYE SURGERY Right 2014   EYE  SURGERY Left 2014   GIVENS CAPSULE STUDY N/A 01/23/2018   Procedure: GIVENS CAPSULE STUDY;  Surgeon: Wyline Mood, MD;  Location: [facility] ENDOSCOPY;  Service: Gastroenterology;  Laterality: N/A;   iron infusion     had 5 iron infusions for anemia   post radiation therapy Right    right breast cancer 2002   RE-EXCISION OF BREAST CANCER,SUPERIOR MARGINS Left 11/09/2020   Procedure: RE-EXCISION OF BREAST CANCER,SUPERIOR MARGINS;  Surgeon: Earline Mayotte,  MD;  Location: [facility];  Service: General;  Laterality: Left;   Stab pheblectomy   2010   vein closure procedure Bilateral 2008    Prior to Admission medications   Medication Sig Start Date End Date Taking? Authorizing Provider  amLODipine (NORVASC) 5 MG tablet TAKE 1 TABLET EVERY DAY 03/06/23   Simmons-Robinson, Tawanna Cooler, MD  atorvastatin (LIPITOR) 10 MG tablet TAKE 1 TABLET EVERY DAY 10/10/22   Simmons-Robinson, Tawanna Cooler, MD  calcium citrate (CALCITRATE - DOSED IN MG ELEMENTAL CALCIUM) 950 MG tablet Take 200 mg of elemental calcium by mouth 2 (two) times daily.     [provider]  clotrimazole-betamethasone (LOTRISONE) cream Apply 1 Application topically 2 (two) times daily. 01/17/23   Allie Bossier, MD  cycloSPORINE (RESTASIS) 0.05 % ophthalmic emulsion Place 1 drop into both eyes 2 (two) times daily. Patient not taking: Reported on 08/23/2023    [provider]  docusate sodium (COLACE) 100 MG capsule Take 100 mg by mouth daily as needed for mild constipation.    [provider]  doxycycline (PERIOSTAT) 20 MG tablet     [provider]  exemestane (AROMASIN) 25 MG tablet TAKE 1 TABLET EVERY DAY AFTER BREAKFAST 03/06/23   [name], MD  ezetimibe (ZETIA) 10 MG tablet TAKE 1 TABLET EVERY DAY 03/06/23   Simmons-Robinson, Tawanna Cooler, MD  gabapentin (NEURONTIN) 100 MG capsule Take 1 capsule (100 mg total) by mouth at bedtime. Patient not taking: Reported on 08/23/2023 03/03/23   Simmons-Robinson, Tawanna Cooler, MD  Glucosamine 750 MG TABS Take 750 mg by mouth daily.    [provider]  Glycerin-Polysorbate 80 (REFRESH DRY EYE THERAPY OP) Place 1 drop into both eyes 2 (two) times daily.    [provider]  Multiple Vitamins-Minerals (MULTIVITAMIN WITH MINERALS) tablet Take 1 tablet by mouth daily.    [provider]  omeprazole (PRILOSEC) 40 MG capsule Take 1 capsule (40 mg total) by mouth daily. 07/26/23 07/20/24  Celso Amy, PA-C  psyllium  (REGULOID) 0.52 G capsule Take 2 capsules by mouth daily. Patient not taking: Reported on 08/23/2023    [provider]  simethicone (MYLICON) 80 MG chewable tablet Chew 80 mg by mouth every 6 (six) hours as needed for flatulence.    [provider]  solifenacin (VESICARE) 10 MG tablet Take 1 tablet (10 mg total) by mouth daily. 01/17/23   Dove, Leanora Ivanoff, MD  SOOLANTRA 1 % CREA Apply 1 application  topically daily as needed (rosacea). 06/30/16   [provider]  spironolactone (ALDACTONE) 50 MG tablet Take 50 mg by mouth daily. Patient not taking: Reported on 08/23/2023 12/29/14   [provider]  traMADol (ULTRAM) 50 MG tablet Take 1 tablet (50 mg total) by mouth every 12 (twelve) hours as needed. 08/15/23   Simmons-Robinson, Tawanna Cooler, MD    Allergies as of 07/26/2023 - Review Complete 07/26/2023  Allergen Reaction Noted   Zometa [zoledronic acid]  12/06/2021    Family History  Problem Relation Age of Onset   Heart attack  Mother    Heart attack Father    Heart attack Sister    Stroke Sister    Heart attack Brother    Other Brother        cerebral hemorrhage, sepsis   Breast cancer Cousin 50       breast/maternal   Cancer Cousin 60       breast/maternal   Breast cancer Cousin    Breast cancer Maternal Aunt 54   Breast cancer Other 48       maternal neice    Social History   Socioeconomic History   Marital status: Married    Spouse name: Peyton Najjar   Number of children: 2   Years of education: Not on file   Highest education level: Bachelor's degree (e.g., BA, AB, BS)  Occupational History   Occupation: Runner, broadcasting/film/video    Comment: retired  Tobacco Use   Smoking status: Never   Smokeless tobacco: Never  Vaping Use   Vaping status: Never Used  Substance and Sexual Activity   Alcohol use: No   Drug use: No   Sexual activity: Never  Other Topics Concern   Not on file  Social History Narrative   Patient lives with husband. She feels safe in her home.    Social Determinants of Health   Financial Resource Strain: Low Risk  (02/01/2023)   Overall Financial Resource Strain (CARDIA)    Difficulty of Paying Living Expenses: Not hard at all  Food Insecurity: No Food Insecurity (02/01/2023)   Hunger Vital Sign    Worried About Running Out of Food in the Last Year: Never true    Ran Out of Food in the Last Year: Never true  Transportation Needs: No Transportation Needs (02/01/2023)   PRAPARE - Administrator, Civil Service (Medical): No    Lack of Transportation (Non-Medical): No  Physical Activity: Inactive (02/01/2023)   Exercise Vital Sign    Days of Exercise per Week: 0 days    Minutes of Exercise per Session: 0 min  Stress: No Stress Concern Present (02/01/2023)   [employer] of Occupational Health - Occupational Stress Questionnaire    Feeling of Stress : Not at all  Social Connections: Moderately Isolated (02/01/2023)   Social Connection and Isolation Panel [NHANES]    Frequency of Communication with Friends and Family: More than three times a week    Frequency of Social Gatherings with Friends and Family: Once a week    Attends Religious Services: Never    Database administrator or Organizations: No    Attends Banker Meetings: Never    Marital Status: Married  Catering manager Violence: Not At Risk (02/01/2023)   Humiliation, Afraid, Rape, and Kick questionnaire    Fear of Current or Ex-Partner: No    Emotionally Abused: No    Physically Abused: No    Sexually Abused: No    Review of Systems: See HPI, otherwise negative ROS  Physical Exam: There were no vitals taken for this visit. General:   Alert,  pleasant and cooperative in NAD Head:  Normocephalic and atraumatic. Neck:  Supple; no masses or thyromegaly. Lungs:  Clear throughout to auscultation, normal respiratory effort.    Heart:  +S1, +S2, Regular rate and rhythm, No edema. Abdomen:  Soft, nontender and nondistended. Normal bowel  sounds, without guarding, and without rebound.   Neurologic:  Alert and  oriented x4;  grossly normal neurologically.  Impression/Plan: KIYAN SIWEK is here for an endoscopy  to be performed for  evaluation of hematemesis    Risks, benefits, limitations, and alternatives regarding endoscopy have been reviewed with the patient.  Questions have been answered.  All parties agreeable.   Wyline Mood, MD  08/30/2023, 9:51 AM

## 2023-08-30 NOTE — Anesthesia Preprocedure Evaluation (Signed)
Anesthesia Evaluation  Patient identified by MRN, date of birth, ID band Patient awake    Reviewed: Allergy & Precautions, H&P , NPO status , Patient's Chart, lab work & pertinent test results, reviewed documented beta blocker date and time   Airway Mallampati: II   Neck ROM: full    Dental  (+) Poor Dentition   Pulmonary shortness of breath and with exertion   Pulmonary exam normal        Cardiovascular Exercise Tolerance: Poor hypertension, On Medications Normal cardiovascular exam+ Valvular Problems/Murmurs  Rhythm:regular Rate:Normal     Neuro/Psych  PSYCHIATRIC DISORDERS Anxiety Depression     Neuromuscular disease    GI/Hepatic Neg liver ROS, hiatal hernia,GERD  Medicated,,  Endo/Other  negative endocrine ROS    Renal/GU negative Renal ROS  negative genitourinary   Musculoskeletal   Abdominal   Peds  Hematology  (+) Blood dyscrasia, anemia   Anesthesia Other Findings Past Medical History: No date: Anemia     Comment:  fe deficiency now improved at age 82 No date: Anxiety No date: Arthritis 08/04/2015: BRCA negative     Comment:  BRCA1 and BRCA2 2002: 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  09/2020: Breast cancer (HCC)     Comment:  left breast 06/06/2023: Carpal tunnel syndrome of right wrist No date: Cataract No date: GERD (gastroesophageal reflux disease) 2001: Heart murmur No date: Hiatal hernia No date: High cholesterol No date: Hypertension     Comment:  patient denies anyone telling her that she has htn 09/19/2020: Malignant tumor of breast (HCC) 06/06/2023: 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 2002, 2022: Personal history of radiation therapy     Comment:  BREAST CA No date: Rosacea No date: 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 complications Past Surgical History: No date: ABDOMINAL HYSTERECTOMY No date: BLEPHAROPLASTY 09/15/2020: BREAST BIOPSY;  Left     Comment:  affirm bx, x marker, DCIS 09/07/2022: BREAST BIOPSY; Left     Comment:  Left Breast Stereo Bx X clip- path pending 09/07/2022: BREAST BIOPSY; Left     Comment:  MM LT BREAST BX W LOC DEV 1ST LESION IMAGE BX SPEC               STEREO GUIDE 09/07/2022 [facility] 2003: BREAST CYST ASPIRATION; Left 2002: BREAST EXCISIONAL BIOPSY; Right     Comment:  POS 2002: BREAST LUMPECTOMY; Right     Comment:  w/ radiation 10/07/2020: BREAST LUMPECTOMY; Left     Comment:  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 No date: BREAST LUMPECTOMY; Left     Comment:  re-excision 10/07/2020: BREAST LUMPECTOMY WITH NEEDLE LOCALIZATION; Left     Comment:  Procedure: BREAST LUMPECTOMY WITH NEEDLE LOCALIZATION;                Surgeon: Earline Mayotte, MD;  Location: [facility] ORS;                Service: General;  Laterality: Left; 2002: BREAST SURGERY; Right     Comment:  lumpectomy No date: CATARACT EXTRACTION W/ INTRAOCULAR LENS  IMPLANT, BILATERAL No date: CHOLECYSTECTOMY 2013: COLONOSCOPY 12/08/2015: COLONOSCOPY WITH PROPOFOL; N/A     Comment:  Procedure: COLONOSCOPY WITH PROPOFOL;  Surgeon:               Kieth Brightly, MD;  Location:  [facility] ENDOSCOPY;                Service: Endoscopy;  Laterality: N/A; 12/06/2017: COLONOSCOPY WITH PROPOFOL; N/A     Comment:  Procedure: COLONOSCOPY WITH PROPOFOL;  Surgeon: Wyline Mood, MD;  Location: [facility] ENDOSCOPY;  Service:               Gastroenterology;  Laterality: N/A; 12/22/2017: COLONOSCOPY WITH PROPOFOL; N/A     Comment:  Procedure: COLONOSCOPY WITH PROPOFOL;  Surgeon: Wyline Mood, MD;  Location: [facility] ENDOSCOPY;  Service:               Gastroenterology;  Laterality: N/A; 12/08/2015: ESOPHAGOGASTRODUODENOSCOPY (EGD) WITH PROPOFOL; N/A     Comment:  Procedure: ESOPHAGOGASTRODUODENOSCOPY (EGD) WITH               PROPOFOL;   Surgeon: Kieth Brightly, MD;  Location:              [facility] ENDOSCOPY;  Service: Endoscopy;  Laterality: N/A; 12/06/2017: ESOPHAGOGASTRODUODENOSCOPY (EGD) WITH PROPOFOL; N/A     Comment:  Procedure: ESOPHAGOGASTRODUODENOSCOPY (EGD) WITH               PROPOFOL;  Surgeon: Wyline Mood, MD;  Location: [facility]               ENDOSCOPY;  Service: Gastroenterology;  Laterality: N/A; 12/22/2017: ESOPHAGOGASTRODUODENOSCOPY (EGD) WITH PROPOFOL; N/A     Comment:  Procedure: ESOPHAGOGASTRODUODENOSCOPY (EGD) WITH               PROPOFOL;  Surgeon: Wyline Mood, MD;  Location: [facility]               ENDOSCOPY;  Service: Gastroenterology;  Laterality: N/A; 2014: EYE SURGERY; Right 2014: EYE SURGERY; Left 01/23/2018: GIVENS CAPSULE STUDY; N/A     Comment:  Procedure: GIVENS CAPSULE STUDY;  Surgeon: Wyline Mood,               MD;  Location: [facility] ENDOSCOPY;  Service:               Gastroenterology;  Laterality: N/A; No date: iron infusion     Comment:  had 5 iron infusions for anemia No date: post radiation therapy; Right     Comment:  right breast cancer 2002 11/09/2020: RE-EXCISION OF BREAST CANCER,SUPERIOR MARGINS; Left     Comment:  Procedure: RE-EXCISION OF BREAST CANCER,SUPERIOR               MARGINS;  Surgeon: Earline Mayotte, MD;  Location:               [facility] ORS;  Service: General;  Laterality: Left; 2010: Stab pheblectomy  2008: vein closure procedure; Bilateral BMI    Body Mass Index: 27.39 kg/m     Reproductive/Obstetrics negative OB ROS                             Anesthesia Physical Anesthesia Plan  ASA: 3  Anesthesia Plan: General   Post-op Pain Management:    Induction:   PONV Risk Score and Plan:   Airway Management Planned:   Additional Equipment:   Intra-op Plan:   Post-operative Plan:   Informed Consent: I have reviewed the patients History and Physical, chart, labs and discussed the procedure including the risks, benefits  and alternatives  for the proposed anesthesia with the patient or authorized representative who has indicated his/her understanding and acceptance.     Dental Advisory Given  Plan Discussed with: CRNA  Anesthesia Plan Comments:        Anesthesia Quick Evaluation

## 2023-08-30 NOTE — Anesthesia Postprocedure Evaluation (Signed)
Anesthesia Post Note  Patient: Claire Kane  Procedure(s) Performed: ESOPHAGOGASTRODUODENOSCOPY (EGD) WITH PROPOFOL  Patient location during evaluation: PACU Anesthesia Type: General Level of consciousness: awake and alert Pain management: pain level controlled Vital Signs Assessment: post-procedure vital signs reviewed and stable Respiratory status: spontaneous breathing, nonlabored ventilation, respiratory function stable and patient connected to nasal cannula oxygen Cardiovascular status: blood pressure returned to baseline and stable Postop Assessment: no apparent nausea or vomiting Anesthetic complications: no   No notable events documented.   Last Vitals:  Vitals:   08/30/23 1050 08/30/23 1100  BP: (!) 144/62 135/65  Pulse: 87   Resp: 19   Temp: 36.4 C   SpO2: 98%     Last Pain:  Vitals:   08/30/23 1100  TempSrc:   PainSc: 0-No pain                 Yevette Edwards

## 2023-08-30 NOTE — Op Note (Signed)
[facility] Gastroenterology Patient Name: Claire Kane Procedure Date: 08/30/2023 10:37 AM MRN: [id number] Account #: [id number] Date of Birth: [birth year] Admit Type: Outpatient Age: 82 Room: [facility] ENDO ROOM 3 Gender: Female Note Status: Finalized Instrument Name: Upper Endoscope [id number] Procedure:             Upper GI endoscopy Indications:           Coffee-ground emesis Providers:             Wyline Mood MD, MD Referring MD:          Nicki Guadalajara (Referring MD) Medicines:             Monitored Anesthesia Care Complications:         No immediate complications. Procedure:             Pre-Anesthesia Assessment:                        - Prior to the procedure, a History and Physical was                         performed, and patient medications, allergies and                         sensitivities were reviewed. The patient's tolerance                         of previous anesthesia was reviewed.                        - The risks and benefits of the procedure and the                         sedation options and risks were discussed with the                         patient. All questions were answered and informed                         consent was obtained.                        - ASA Grade Assessment: II - A patient with mild                         systemic disease.                        After obtaining informed consent, the endoscope was                         passed under direct vision. Throughout the procedure,                         the patient's blood pressure, pulse, and oxygen                         saturations were monitored continuously. The Endoscope                         was introduced through  the mouth, and advanced to the                         third part of duodenum. The upper GI endoscopy was                         accomplished with ease. The patient tolerated the                         procedure well. Findings:      The esophagus was  normal.      The examined duodenum was normal.      A large hiatal hernia was present.      The cardia and gastric fundus were normal on retroflexion. Impression:            - Normal esophagus.                        - Normal examined duodenum.                        - Large hiatal hernia.                        - No specimens collected. Recommendation:        - Discharge patient to home (with escort).                        - Resume previous diet.                        - Continue present medications.                        - Return to GI office PRN. Procedure Code(s):     --- Professional ---                        [phone], Esophagogastroduodenoscopy, flexible,                         transoral; diagnostic, including collection of                         specimen(s) by brushing or washing, when performed                         (separate procedure) Diagnosis Code(s):     --- Professional ---                        K44.9, Diaphragmatic hernia without obstruction or                         gangrene                        K92.0, Hematemesis CPT copyright 2022 [employer]. All rights reserved. The codes documented in this report are preliminary and upon coder review may  be revised to meet current compliance requirements. Wyline Mood, MD Wyline Mood MD, MD 08/30/2023 10:49:07 AM This report has been signed electronically. Number of Addenda: 0 Note Initiated On: 08/30/2023 10:37  AM Estimated Blood Loss:  Estimated blood loss: none.      [facility]

## 2023-08-31 ENCOUNTER — Other Ambulatory Visit: Payer: Self-pay | Admitting: Family Medicine

## 2023-08-31 ENCOUNTER — Encounter: Payer: Self-pay | Admitting: Gastroenterology

## 2023-08-31 DIAGNOSIS — R928 Other abnormal and inconclusive findings on diagnostic imaging of breast: Secondary | ICD-10-CM

## 2023-09-04 ENCOUNTER — Ambulatory Visit: Payer: Medicare PPO | Admitting: Surgery

## 2023-09-04 ENCOUNTER — Encounter: Payer: Self-pay | Admitting: Surgery

## 2023-09-04 VITALS — BP 117/64 | HR 75 | Temp 98.2°F | Ht 62.5 in | Wt 153.0 lb

## 2023-09-04 DIAGNOSIS — K449 Diaphragmatic hernia without obstruction or gangrene: Secondary | ICD-10-CM

## 2023-09-04 DIAGNOSIS — K219 Gastro-esophageal reflux disease without esophagitis: Secondary | ICD-10-CM

## 2023-09-04 NOTE — Patient Instructions (Addendum)
We will have you follow up here in one month for a pre op exam with Dr Everlene Farrier.    We have spoken today about repairing your Hiatal Hernia. Your surgery will be scheduled at [facility] with Dr. Everlene Farrier.  Plan to be in the hospital for 1-2 days if the minimally invasive surgery is completed without having to make a bigger incision. If the bigger incision is made, you will most likely need to be in the hospital 4-6 days. You will be on a soft diet and need to recover for 2 weeks following your surgery prior to doing any of your normal activities. At the 2 week mark, we will see you in the office and if you are doing ok we will advance your diet and activity level as you tolerate.  Please see your Blue (Pre-care) Sheet for more information regarding your surgery. Our surgery scheduler will call you to verify surgery date and to go over information  You will need to arrange to be out of work for approximately 1-2 weeks and then you may return with a lifting restriction for 4 more weeks. If you have FMLA or Disability paperwork that needs to be filled out, please have your company fax your paperwork to [phone] or you may drop this by either office. This paperwork will be filled out within 3 days after your surgery has been completed.  Please call our office with any questions or concerns that you have regarding your surgery and recovery.    Laparoscopic Nissen Fundoplication Laparoscopic Nissen fundoplication is surgery to relieve heartburn and other problems caused by gastric fluids flowing up into your esophagus. The esophagus is the tube that carries food and liquid from your throat to your stomach. Normally, the muscle that sits between your stomach and your esophagus (lower esophageal sphincter or LES) keeps stomach fluids in your stomach. In some people, the LES does not work properly, and stomach fluids flow up into the esophagus. This can happen when part of the stomach bulges through the LES  (hiatal hernia). The backward flow of stomach fluids can cause a type of severe and long-standing heartburn that is called gastroesophageal reflux disease (GERD). You may need this surgery if other treatments for GERD have not helped. In a laparoscopic Nissen fundoplication, the upper part of your stomach is wrapped around the lower part of your esophagus to strengthen the LES and prevent reflux. If you have a hiatal hernia, it will also be repaired with this surgery. The procedure is done through several small incisions in your abdomen. It is performed using a thin, telescopic instrument (laparoscope) and other instruments that can pass through the scope or through other small incisions. Tell a health care provider about: Any allergies you have. All medicines you are taking, including vitamins, herbs, eye drops, creams, and over-the-counter medicines. Any problems you or family members have had with anesthetic medicines. Any blood disorders you have. Any surgeries you have had. Any medical conditions you have. What are the risks? Generally, this is a safe procedure. However, problems may occur, including: Difficulty swallowing (dysphagia). Bloating. Nausea or vomiting. Damage to the lung, causing a collapsed lung. Infection or bleeding. What happens before the procedure? Ask your health care provider about: Changing or stopping your regular medicines. This is especially important if you are taking diabetes medicines or blood thinners. Taking medicines such as aspirin and ibuprofen. These medicines can thin your blood. Do not take these medicines before your procedure if your health care  provider asks you not to. Follow your health care provider's instructions about eating or drinking restrictions. Plan to have someone take you home after the procedure. What happens during the procedure? An IV tube will be inserted into one of your veins. It will be used to give you fluids and medicines during  the procedure. You will be given a medicine that makes you fall asleep (general anesthetic). Your abdomen will be cleaned with a germ-killing solution (antiseptic). The surgeon will make a small incision in your abdomen and insert a tube through the incision. Your abdomen will be filled with a gas. This helps the surgeon to see your organs more easily and it makes more space to work. The surgeon will insert the laparoscope through the incision. The scope has a camera that will send pictures to a monitor in the operating room. The surgeon will make several other small incisions in your abdomen to insert the other instruments that are needed during the procedure. Another instrument (dilator) will be passed through your mouth and down your esophagus into the upper part of your stomach. The dilator will prevent your LES from being closed too tightly during surgery. The surgeon will pass the top portion of your stomach behind the lower part of your esophagus and wrap it all the way around. This will be stitched into place. If you have a hiatal hernia, it will be repaired during this procedure. All instruments will be removed, and your incisions will be closed under your skin with stitches (sutures). Skin adhesive strips may also be used. A bandage (dressing) will be placed on your skin over the incisions. The procedure may vary among health care providers and hospitals. What happens after the procedure? You will be moved to a recovery area. Your blood pressure, heart rate, breathing rate, and blood oxygen level will be monitored often until the medicines you were given have worn off. You will be given pain medicine as needed. Your IV tube will be kept in until you are able to drink fluids. This information is not intended to replace advice given to you by your health care provider. Make sure you discuss any questions you have with your health care provider. Document Released: 09/26/2014 Document  Revised: 02/11/2016 Document Reviewed: 05/07/2014 Elsevier Interactive Patient Education  2017 [employer]   Laparoscopic Nissen Fundoplication, Care After Refer to this sheet in the next few weeks. These instructions provide you with information about caring for yourself after your procedure. Your health care provider may also give you more specific instructions. Your treatment has been planned according to current medical practices, but problems sometimes occur. Call your health care provider if you have any problems or questions after your procedure. What can I expect after the procedure? After the procedure, it is common to have: Difficulty swallowing (dysphagia). Excess gas (bloating). Follow these instructions at home: Medicines  Take medicines only as directed by your health care provider. Do not drive or operate heavy machinery while taking pain medicine. Incision care  There are many different ways to close and cover an incision, including stitches (sutures), skin glue, and adhesive strips. Follow your health care provider's instructions about: Incision care. Bandage (dressing) changes and removal. Incision closure removal. Check your incision areas every day for signs of infection. Watch for: Redness, swelling, or pain. Fluid, blood, or pus. Do not take baths, swim, or use a hot tub until your health care provider approves. Take showers as directed by your health care provider. Eating and  drinking  Follow your health care provider's instructions about eating. You may need to follow a very soft diet for 2 weeks, followed by a diet of more regular foods for 2 weeks, no breads. You should return to your usual diet gradually. Drink enough fluid to keep your urine clear or pale yellow. Activity  Return to your normal activities as directed by your health care provider. Ask your health care provider what activities are safe for you. Avoid strenuous exercise. Do not lift anything  that is heavier than 10 lb (4.5 kg). Ask your health care provider when you can: Return to sexual activity. Drive. Go back to work. Contact a health care provider if: You have a fever. Your pain gets worse or is not helped by medicine. You have frequent nausea or vomiting. You have continued abdominal bloating. You have an ongoing (persistent) cough. You have redness, swelling, or pain in any incision areas. You have fluid, blood, or pus coming from any incisions. Get help right away if: You have trouble breathing. You are unable to swallow. You have persistent vomiting. You have blood in your vomit. You have severe abdominal pain. This information is not intended to replace advice given to you by your health care provider. Make sure you discuss any questions you have with your health care provider. Document Released: 04/28/2004 Document Revised: 02/11/2016 Document Reviewed: 05/07/2014 Elsevier Interactive Patient Education  2017 [employer]   Diet After Nissen Fundoplication Surgery This diet information is for patients who have recently had Nissen fundoplication surgery to correct reflux disease or to repair various types of hernias, such as hiatal hernia and intrathoracic stomach. This diet may also be used for other gastrointestinal surgeries, such as Heller myotomy and repair of achalasia. The diet will help control diarrhea, excess gas and swallowing problems, which may occur after this type of surgery. Keeping Your Stomach from Stretching Eat small, frequent meals (six to eight per day). This will help you consume the majority of the nutrients you need without causing your stomach to feel full or distended.  Drinking large amounts of fluids with meals can stretch your stomach. You may drink fluids between meals as often as you like, but limit fluids to 1/2 cup (4 fluid ounces) with meals and one cup (8 fluid ounces) with snacks.  Sit upright while eating and stay upright for  30 minutes after each meal. Gravity can help food move through your digestive tract. Do not lie down after eating. Sit upright for 2 hours after your last meal or snack of the day.  Eat very slowly. Take your time when eating.  Take small bites and chew your food well to help aid in swallowing and digestion.  Avoid crusty breads and sticky, gummy foods, such as bananas, fresh doughy breads, rolls and doughnuts. These types of foods become sticky and difficult to swallow.  Toasted breads tend to be better tolerated.  Lastly, if you eat sweets, consume them at the end of your meal to avoid a group of symptoms referred to as "dumping syndrome". This describes the rapid emptying of foods from the stomach to the small intestine. Sweetened beverages, candy and desserts move more rapidly and dump quickly into the intestines. This can cause symptoms of nausea, weakness, cold sweats, cramps, diarrhea and dizzy spells.  Avoiding Gas Avoid drinking through a straw. Do not chew gum or tobacco. These actions cause you to swallow air, which produces excess gas in your stomach. Chew with your mouth closed.  Avoid any foods that cause stomach gas and distention. These foods include corn, dried beans, peas, lentils, onions, broccoli, cauliflower and any food from the cabbage family.  Avoid carbonated drinks, alcohol, citrus and tomato products.  When will I be able to eat a soft diet? After Nissen fundoplication surgery, your diet will be advanced slowly by your surgeon. Generally, you will be on a clear liquid diet for the first few meals. Then you will advance to the full liquid diet for a meal or two and eventually to a Nissen soft diet. Please be aware that each patient's tolerance to food is different. Your doctor will advance your diet depending on how well you progress after surgery. Clear Liquid Diet  The first diet after surgery is the clear liquid diet. It includes the following liquids: Apple juice   Cranberry juice  Grape juice  Chicken broth  Beef broth  Flavored gelatin (Jell-O)  Decaf tea and coffee  Caffeinated beverages are permitted based on tolerance  Popsicles  [place] ice Carbonated drinks (sodas) are not allowed for the first six to eight weeks after surgery. After this time you can try them again in small amounts.  Full Liquid Diet The full liquid diet contains anything on the clear liquid diet, plus: Milk, soy, rice and almond (no chocolate)  Cream of wheat, cream of rice, grits  Strained creamed soups (no tomato or broccoli)  Vanilla and strawberry-flavored ice cream  Sherbet  Blended, custard styled or whipped yogurt (plain or vanilla only)  Vanilla and butterscotch pudding (no chocolate or coconut)  Nutritional drinks including Ensure, Boost, Carnation Instant Breakfast (no chocolate-flavored) Note: Dairy products, such as milk, ice cream and pudding, may cause diarrhea in some people just after surgery. You may need to avoid milk products. If so, substitute them with lactose-free beverages, such as soy, rice, Lactaid or almond milks.  Nissen Soft Diet Food Category Foods to Choose Foods to Avoid  Beverages Milk, such as, whole, 2%, 1%, non-fat, or skim, soy, rice, almond  Caffeinated and decaf tea and coffee  Powdered drink mixes (in moderation)  Non-citrus juices (apple, grape, cranberry or blends of these)  Fruit nectars  Nutritional drinks including Boost, Ensure, Carnation Instant Breakfast Chocolate milk, cocoa or other chocolate-flavored drinks  Carbonated drinks  Alcohol  Citrus juices like orange, grapefruit, lemon and lime  Breads Crackers (saltine, butter, soda, graham, Goldfish and Cheese Nips)   Untoasted bread, bagels, [place] and hard rolls, English muffins  Crackers with nuts, seeds, fresh or dried fruit, coconut, or highly seasoned, such as garlic or onion-flavored  Sweet rolls, coffee cake or doughnuts  Cereals Well cooked  cereals, such as oatmeal (plain or flavored)  Cold cereal (Cornflakes, Rice Krispies, Cheerios, Special K plain, Rice Chex and puffed rice) Very coarse cereal, such as bran, shredded wheat  Any cereal with fresh or dried fruit, coconut, seeds or nuts  Desserts Eat in moderation and do not eat desserts or sweets by themselves. Plain cakes, cookies and cream-filled pies  Vanilla and butterscotch pudding or custard  Ice cream, ice milk, frozen yogurt and sherbet  Gelatin made from allowed foods  Fruit ices and popsicles Desserts containing chocolate, coconut, nuts, seeds, fresh or dried fruit, peppermint or spearmint  Eggs  Poached, hard boiled or scrambled Fried eggs and highly seasoned eggs (deviled eggs)  Fats Eat in moderation. Butter and margarine  Mayonnaise and vegetable oils  Mildly seasoned cream sauces and gravies  Plain cream cheese  Sour cream  Highly seasoned salad dressings, cream sauces and gravies  Bacon, bacon fat, ham fat, lard and salt pork  Fried foods  Nuts  Fruits Fruit juice  Any canned or cooked fruit except those listed in the AVOID column ALL fresh fruits, such as citrus, apples, and pineapple  Canned pineapple  Dried fruits, such as raisins, berries  Fruits with seeds, such as berries, kiwi and figs  Meat, Fish, Poultry, and [employer] may be ground, minced or chopped to ease swallowing and digestion  Tender, well cooked and moist cuts of beef, chicken, [place] and pork  Veal and lamb  Flaky, cooked fish  Canned tuna  Cottage and ricotta cheeses  Mild cheese, such as American, brick, mozzarella and baby Swiss  Creamy peanut butter  Plain custard or blended fruit yogurt  Moist casseroles, such as macaroni & cheese, tuna noodle  Grilled or toasted cheese sandwich Tough meats with a lot of gristle  Fried, highly seasoned, smoked and fatty meat, fish or poultry, such as frankfurters, luncheon meats, sausage, bacon, spare ribs, beef brisket,  sardines, anchovies, duck and goose  Chili and other entrees made with pepper or chili pepper  Shellfish  Strongly flavored cheeses, such as sharp cheese, extra sharp cheddar, cheese containing peppers or other seasonings  Crunchy peanut butter  Any yogurt with nuts, seeds, coconut, strawberries or raspberries  Potatoes and Starches Peeled, mashed or boiled white or sweet potatoes  Oven-baked potatoes without skin  Well cooked white rice, enriched noodles, barley, spaghetti, macaroni and other pastas Fried potatoes, potato skins and potato chips  Hard and soft taco shells  Fried, [place]ucci or wild rice  Soups Mildly flavored meat stocks  Cream soups made from allowed foods Highly seasoned soups and tomato based soups, cream soups made with gas producing vegetables, such as broccoli, cauliflower, onion, etc.  Sweets and Snacks Use in moderation and do not eat large amounts of sweets by themselves. Syrup, honey, jelly and seedless jam  Plain hard candies and plain candies made with allowed ingredients  Molasses  Marshmallows  Other candy made from allowed ingredients  Thin pretzels Jam, marmalade and preserves  Chocolate in any form  Any candy containing nuts, coconut, seeds, peppermint, spearmint or dried or fresh fruit  Popcorn, potato chips, tortilla chips  Soft or hard thick pretzels, such as sourdough  Vegetables Well cooked soft vegetables without seeds or skins, such as asparagus tips, beets, carrots, green and wax beans, chopped spinach, tender canned baby peas, squash and pumpkin Raw vegetables, tomatoes, tomato juice, tomato sauce and V-8 juice(tomato based products can irritate the stomach)  Gas producing vegetables, such as broccoli, Brussel sprouts, cabbage, cauliflower, onions, corn, cucumber, green peppers, rutabagas, turnips, radishes and sauerkraut  Dried beans, peas and lentils  Miscellaneous Salt and spices in moderation  Mustard and vinegar in moderation Fried or highly  seasoned foods  Coconut and seeds  Pickles and olives  Chili sauces, ketchup, barbecue sauce, horseradish, black pepper, chili powder and onion and garlic seasonings  Any other strongly flavored seasoning, condiment, spice or herb not tolerated  Any food not tolerated

## 2023-09-04 NOTE — Progress Notes (Signed)
Request for Medical Clearance for surgery has been faxed to Dr Neita Garnet.

## 2023-09-05 ENCOUNTER — Encounter: Payer: Self-pay | Admitting: Surgery

## 2023-09-05 ENCOUNTER — Telehealth: Payer: Self-pay | Admitting: Surgery

## 2023-09-05 ENCOUNTER — Telehealth: Payer: Self-pay | Admitting: Family Medicine

## 2023-09-05 ENCOUNTER — Ambulatory Visit: Payer: Medicare PPO | Admitting: Gastroenterology

## 2023-09-05 NOTE — Progress Notes (Signed)
Outpatient Surgical Follow Up    Claire Kane is an 82 y.o. female.   Chief Complaint  Patient presents with   Follow-up    HPI:  Claire Kane is a 82 y.o. female seen in follow-up for  large paraesophageal hernia.    More recently over the last year or so is becoming more symptomatic.  She does have episodes of regurgitation.  She does have some dysphagia for certain meals such as bread.  She does have a history of iron deficiency anemia and had a EGD in 2019.  I have personally reviewed the images showing evidence of a large hiatal hernia.  She does have some constipation.  He also reports early satiety. Her symptoms get worse when she lays down.  Does have some intermittent cough hemoglobin 11.5 Prior surgical history includes abdominal hysterectomy as well as ectomy of the breast for cancer SHe did have a recent CT and barium swallow that have personally reviewed showing evidence of a giant paraesophageal hernia with entire stomach within the mediastinum.  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    Breast cancer (HCC) 09/2020   left breast   Carpal tunnel syndrome of right wrist 06/06/2023   Cataract    GERD (gastroesophageal reflux disease)    Heart murmur 2001   Hiatal hernia    High cholesterol    Hypertension    patient denies anyone telling her that she has htn   Malignant tumor of breast (HCC) 09/19/2020   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   Personal history of radiation therapy 2002, 2022   BREAST CA   Rosacea    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 complications     Past Surgical History:  Procedure Laterality Date   ABDOMINAL HYSTERECTOMY     BLEPHAROPLASTY     BREAST BIOPSY Left 09/15/2020    affirm bx, x marker, DCIS   BREAST BIOPSY Left 09/07/2022   Left Breast Stereo Bx X clip- path pending   BREAST BIOPSY Left 09/07/2022   MM LT BREAST BX W LOC DEV 1ST LESION IMAGE BX SPEC STEREO GUIDE 09/07/2022 [facility]   BREAST CYST ASPIRATION Left 2003   BREAST EXCISIONAL BIOPSY Right 2002   POS   BREAST LUMPECTOMY Right 2002   w/ radiation   BREAST LUMPECTOMY Left 10/07/2020   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   BREAST LUMPECTOMY Left    re-excision   BREAST LUMPECTOMY WITH NEEDLE LOCALIZATION Left 10/07/2020   Procedure: BREAST LUMPECTOMY WITH NEEDLE LOCALIZATION;  Surgeon: Earline Mayotte, MD;  Location: [facility];  Service: General;  Laterality: Left;   BREAST SURGERY Right 2002   lumpectomy   CATARACT EXTRACTION W/ INTRAOCULAR LENS  IMPLANT, BILATERAL     CHOLECYSTECTOMY     COLONOSCOPY  2013   COLONOSCOPY WITH PROPOFOL N/A 12/08/2015   Procedure: COLONOSCOPY WITH PROPOFOL;  Surgeon: Kieth Brightly, MD;  Location: [facility]OSCOPY;  Service: Endoscopy;  Laterality: N/A;   COLONOSCOPY WITH PROPOFOL N/A 12/06/2017   Procedure: COLONOSCOPY WITH PROPOFOL;  Surgeon: Wyline Mood, MD;  Location: [facility] ENDOSCOPY;  Service: Gastroenterology;  Laterality: N/A;   COLONOSCOPY WITH PROPOFOL N/A 12/22/2017   Procedure: COLONOSCOPY WITH PROPOFOL;  Surgeon: Wyline Mood, MD;  Location: [facility] ENDOSCOPY;  Service: Gastroenterology;  Laterality: N/A;   ESOPHAGOGASTRODUODENOSCOPY (EGD) WITH PROPOFOL N/A 12/08/2015   Procedure: ESOPHAGOGASTRODUODENOSCOPY (EGD) WITH PROPOFOL;  Surgeon: Kieth Brightly, MD;  Location: [facility] ENDOSCOPY;  Service: Endoscopy;  Laterality: N/A;   ESOPHAGOGASTRODUODENOSCOPY (EGD) WITH PROPOFOL N/A 12/06/2017   Procedure: ESOPHAGOGASTRODUODENOSCOPY (EGD) WITH PROPOFOL;  Surgeon: Wyline Mood, MD;  Location: [facility] ENDOSCOPY;  Service: Gastroenterology;  Laterality: N/A;    ESOPHAGOGASTRODUODENOSCOPY (EGD) WITH PROPOFOL N/A 12/22/2017   Procedure: ESOPHAGOGASTRODUODENOSCOPY (EGD) WITH PROPOFOL;  Surgeon: Wyline Mood, MD;  Location: [facility] ENDOSCOPY;  Service: Gastroenterology;  Laterality: N/A;   ESOPHAGOGASTRODUODENOSCOPY (EGD) WITH PROPOFOL N/A 08/30/2023   Procedure: ESOPHAGOGASTRODUODENOSCOPY (EGD) WITH PROPOFOL;  Surgeon: Wyline Mood, MD;  Location: [facility] ENDOSCOPY;  Service: Gastroenterology;  Laterality: N/A;   EYE SURGERY Right 2014   EYE SURGERY Left 2014   GIVENS CAPSULE STUDY N/A 01/23/2018   Procedure: GIVENS CAPSULE STUDY;  Surgeon: Wyline Mood, MD;  Location: [facility] ENDOSCOPY;  Service: Gastroenterology;  Laterality: N/A;   iron infusion     had 5 iron infusions for anemia   post radiation therapy Right    right breast cancer 2002   RE-EXCISION OF BREAST CANCER,SUPERIOR MARGINS Left 11/09/2020   Procedure: RE-EXCISION OF BREAST CANCER,SUPERIOR MARGINS;  Surgeon: Earline Mayotte, MD;  Location: [facility] ORS;  Service: General;  Laterality: Left;   Stab pheblectomy   2010   vein closure procedure Bilateral 2008    Family History  Problem Relation Age of Onset   Heart attack Mother    Heart attack Father    Heart attack Sister    Stroke Sister    Heart attack Brother    Other Brother        cerebral hemorrhage, sepsis   Breast cancer Cousin 50       breast/maternal   Cancer Cousin 60       breast/maternal   Breast cancer Cousin    Breast cancer Maternal Aunt 54   Breast cancer Other 48       maternal neice    Social History:  reports that she has never smoked. She has never been exposed to tobacco smoke. She has never used smokeless tobacco. She reports that she does not drink alcohol and does not use drugs.  Allergies:  Allergies  Allergen Reactions   Zometa [Zoledronic Acid]     Severe back pain    Medications reviewed.    ROS Full ROS performed and is otherwise negative other than what is stated in HPI   BP 117/64   Pulse  75   Temp 98.2 F (36.8 C)   Ht 5' 2.5" (1.588 m)   Wt 153 lb (69.4 kg)   SpO2 98%   BMI 27.54 kg/m   Physical Exam exam done with chaperone present CONSTITUTIONAL: NAD. EYES: Pupils are equal, round, Sclera are non-icteric. EARS, NOSE, MOUTH AND THROAT: The oral mucosa is pink and moist. Hearing is intact to voice. LYMPH NODES:  Lymph nodes in the neck are normal. RESPIRATORY:  Lungs are clear. There is normal respiratory effort, with equal breath sounds bilaterally, and without pathologic use of accessory muscles. BREAST: There is evidence of radiation changes on both her breast as well as prior lumpectomy scars.  There is no evidence of definitive masses although this is limited due to radiation changes.  There is no  evidence of axillary lymphadenopathy CARDIOVASCULAR: Heart is regular without murmurs, gallops, or rubs. GI: The abdomen is  soft, nontender, and nondistended. There are no palpable masses. There is no hepatosplenomegaly. There are normal bowel sounds in all quadrants. GU: Rectal deferred.   MUSCULOSKELETAL: Normal muscle strength and tone. No cyanosis or edema.   SKIN: Turgor is good and there are no pathologic skin lesions or ulcers. NEUROLOGIC: Motor and sensation is grossly normal. Cranial nerves are grossly intact. PSYCH:  Oriented to person, place and time. Affect is normal.  Assessment/Plan: 82 year old female with a giant paraesophageal hernia that has significant symptoms I  Discussed with patient in detail about her disease process.   DisCussed with the patient in detail about robotic paraesophageal hernia repair.  What this will entail.  The risks and benefits and the possible complications.  Also discussed with her about diet medication. He does have an upcoming diagnostic mammogram due to an asymmetry of her breast We will see her in a few weeks to have another surgical discussion and to at that time pin down a date.  She seems to be interested in moving  forward with paraesophageal hernia repair robotically. Plan robotic repair paraesophageal hernia repair with partial fundoplication in the near future Please note that I spent 40 minutes in this encounter including person reviewing extensive medical records, reviewing imaging studies, coordinating her care, placing orders and performing documentation  Sterling Big, MD [facility] General Surgeon

## 2023-09-05 NOTE — Telephone Encounter (Signed)
Please call patient to schedule pre-operative evaluation for robotic repair of large hiatal hernia under general anesthesia scheduled for 10/10/23  I have received pre operative medical optimization form for this procedure

## 2023-09-05 NOTE — Telephone Encounter (Signed)
Patient has been advised of Pre-Admission date/time, and Surgery date at [facility]  Surgery Date: 10/10/23 Preadmission Testing Date: 10/02/23 (phone 8a-1p)  Patient has been made aware to call [phone], between 1-3:00pm the day before surgery, to find out what time to arrive for surgery.

## 2023-09-06 ENCOUNTER — Ambulatory Visit
Admission: RE | Admit: 2023-09-06 | Discharge: 2023-09-06 | Disposition: A | Discharge status: Home / Self Care | Payer: Medicare PPO | Source: Ambulatory Visit | Attending: Family Medicine | Admitting: Family Medicine

## 2023-09-06 ENCOUNTER — Inpatient Hospital Stay
Admission: RE | Admit: 2023-09-06 | Discharge: 2023-09-06 | Discharge status: Home / Self Care | Payer: Medicare PPO | Source: Ambulatory Visit | Attending: Family Medicine

## 2023-09-06 ENCOUNTER — Other Ambulatory Visit: Payer: Self-pay | Admitting: Family Medicine

## 2023-09-06 DIAGNOSIS — R928 Other abnormal and inconclusive findings on diagnostic imaging of breast: Secondary | ICD-10-CM

## 2023-09-06 DIAGNOSIS — D0512 Intraductal carcinoma in situ of left breast: Secondary | ICD-10-CM | POA: Diagnosis not present

## 2023-09-06 DIAGNOSIS — R921 Mammographic calcification found on diagnostic imaging of breast: Secondary | ICD-10-CM | POA: Diagnosis not present

## 2023-09-06 DIAGNOSIS — R92333 Mammographic heterogeneous density, bilateral breasts: Secondary | ICD-10-CM | POA: Diagnosis not present

## 2023-09-06 NOTE — Telephone Encounter (Signed)
Appt scheduled for 09/28/2023

## 2023-09-13 ENCOUNTER — Other Ambulatory Visit: Payer: Self-pay

## 2023-09-13 ENCOUNTER — Inpatient Hospital Stay
Admission: EM | Admit: 2023-09-13 | Discharge: 2023-09-16 | DRG: 392 | Disposition: A | Discharge status: Home / Self Care | Payer: Medicare PPO | Attending: Student in an Organized Health Care Education/Training Program | Admitting: Student in an Organized Health Care Education/Training Program

## 2023-09-13 ENCOUNTER — Emergency Department: Payer: Medicare PPO

## 2023-09-13 DIAGNOSIS — D0512 Intraductal carcinoma in situ of left breast: Secondary | ICD-10-CM | POA: Diagnosis present

## 2023-09-13 DIAGNOSIS — Z4682 Encounter for fitting and adjustment of non-vascular catheter: Secondary | ICD-10-CM | POA: Diagnosis not present

## 2023-09-13 DIAGNOSIS — Z9049 Acquired absence of other specified parts of digestive tract: Secondary | ICD-10-CM | POA: Diagnosis not present

## 2023-09-13 DIAGNOSIS — Z961 Presence of intraocular lens: Secondary | ICD-10-CM | POA: Diagnosis present

## 2023-09-13 DIAGNOSIS — D72829 Elevated white blood cell count, unspecified: Secondary | ICD-10-CM | POA: Diagnosis present

## 2023-09-13 DIAGNOSIS — K440 Diaphragmatic hernia with obstruction, without gangrene: Secondary | ICD-10-CM | POA: Diagnosis not present

## 2023-09-13 DIAGNOSIS — Z888 Allergy status to other drugs, medicaments and biological substances status: Secondary | ICD-10-CM

## 2023-09-13 DIAGNOSIS — T451X5A Adverse effect of antineoplastic and immunosuppressive drugs, initial encounter: Secondary | ICD-10-CM | POA: Diagnosis present

## 2023-09-13 DIAGNOSIS — E7800 Pure hypercholesterolemia, unspecified: Secondary | ICD-10-CM | POA: Diagnosis not present

## 2023-09-13 DIAGNOSIS — Z853 Personal history of malignant neoplasm of breast: Secondary | ICD-10-CM

## 2023-09-13 DIAGNOSIS — Z90710 Acquired absence of both cervix and uterus: Secondary | ICD-10-CM

## 2023-09-13 DIAGNOSIS — Z923 Personal history of irradiation: Secondary | ICD-10-CM | POA: Diagnosis not present

## 2023-09-13 DIAGNOSIS — E876 Hypokalemia: Secondary | ICD-10-CM | POA: Diagnosis present

## 2023-09-13 DIAGNOSIS — R140 Abdominal distension (gaseous): Secondary | ICD-10-CM | POA: Diagnosis not present

## 2023-09-13 DIAGNOSIS — Z9841 Cataract extraction status, right eye: Secondary | ICD-10-CM | POA: Diagnosis not present

## 2023-09-13 DIAGNOSIS — Z823 Family history of stroke: Secondary | ICD-10-CM | POA: Diagnosis not present

## 2023-09-13 DIAGNOSIS — I700 Atherosclerosis of aorta: Secondary | ICD-10-CM | POA: Diagnosis present

## 2023-09-13 DIAGNOSIS — Z9842 Cataract extraction status, left eye: Secondary | ICD-10-CM

## 2023-09-13 DIAGNOSIS — I10 Essential (primary) hypertension: Secondary | ICD-10-CM | POA: Diagnosis not present

## 2023-09-13 DIAGNOSIS — Z79811 Long term (current) use of aromatase inhibitors: Secondary | ICD-10-CM

## 2023-09-13 DIAGNOSIS — Z79899 Other long term (current) drug therapy: Secondary | ICD-10-CM | POA: Diagnosis not present

## 2023-09-13 DIAGNOSIS — Z8249 Family history of ischemic heart disease and other diseases of the circulatory system: Secondary | ICD-10-CM | POA: Diagnosis not present

## 2023-09-13 DIAGNOSIS — G620 Drug-induced polyneuropathy: Secondary | ICD-10-CM | POA: Diagnosis present

## 2023-09-13 DIAGNOSIS — K449 Diaphragmatic hernia without obstruction or gangrene: Secondary | ICD-10-CM

## 2023-09-13 DIAGNOSIS — Z1152 Encounter for screening for COVID-19: Secondary | ICD-10-CM | POA: Diagnosis not present

## 2023-09-13 DIAGNOSIS — R112 Nausea with vomiting, unspecified: Secondary | ICD-10-CM | POA: Diagnosis not present

## 2023-09-13 DIAGNOSIS — K311 Adult hypertrophic pyloric stenosis: Secondary | ICD-10-CM | POA: Diagnosis present

## 2023-09-13 LAB — COMPREHENSIVE METABOLIC PANEL
ALT: 20 U/L (ref 0–44)
AST: 25 U/L (ref 15–41)
Albumin: 4.7 g/dL (ref 3.5–5.0)
Alkaline Phosphatase: 54 U/L (ref 38–126)
Anion gap: 15 (ref 5–15)
BUN: 17 mg/dL (ref 8–23)
CO2: 26 mmol/L (ref 22–32)
Calcium: 9.5 mg/dL (ref 8.9–10.3)
Chloride: 98 mmol/L (ref 98–111)
Creatinine, Ser: 0.73 mg/dL (ref 0.44–1.00)
GFR, Estimated: >60 mL/min (ref >60)
Glucose, Bld: 165 mg/dL — ABNORMAL HIGH (ref 70–99)
Potassium: 2.8 mmol/L — ABNORMAL LOW (ref 3.5–5.1)
Sodium: 139 mmol/L (ref 135–145)
Total Bilirubin: 1.7 mg/dL — ABNORMAL HIGH (ref <1.2)
Total Protein: 8 g/dL (ref 6.5–8.1)

## 2023-09-13 LAB — CBC
HCT: 35.1 % — ABNORMAL LOW (ref 36.0–46.0)
Hemoglobin: 11.8 g/dL — ABNORMAL LOW (ref 12.0–15.0)
MCH: 28 pg (ref 26.0–34.0)
MCHC: 33.6 g/dL (ref 30.0–36.0)
MCV: 83.2 fL (ref 80.0–100.0)
Platelets: 459 10*3/uL — ABNORMAL HIGH (ref 150–400)
RBC: 4.22 MIL/uL (ref 3.87–5.11)
RDW: 13.8 % (ref 11.5–15.5)
WBC: 10 10*3/uL (ref 4.0–10.5)
nRBC: 0 % (ref 0.0–0.2)

## 2023-09-13 LAB — LIPASE, BLOOD: Lipase: 30 U/L (ref 11–51)

## 2023-09-13 MED ORDER — IOHEXOL 300 MG/ML  SOLN
100.0000 mL | Freq: Once | INTRAMUSCULAR | Status: AC | PRN
  Administered 2023-09-13: 100 mL via INTRAVENOUS

## 2023-09-13 MED ORDER — ONDANSETRON HCL 4 MG/2ML IJ SOLN
4.0000 mg | Freq: Once | INTRAMUSCULAR | Status: AC
  Administered 2023-09-13: 4 mg via INTRAVENOUS
  Filled 2023-09-13: qty 2

## 2023-09-13 MED ORDER — SODIUM CHLORIDE 0.9 % IV BOLUS
500.0000 mL | Freq: Once | INTRAVENOUS | Status: AC
Start: 2023-09-13 — End: 2023-09-13
  Administered 2023-09-13: 500 mL via INTRAVENOUS

## 2023-09-13 MED ORDER — SODIUM CHLORIDE 0.9 % IV BOLUS
500.0000 mL | Freq: Once | INTRAVENOUS | Status: AC
  Administered 2023-09-13: 500 mL via INTRAVENOUS

## 2023-09-13 NOTE — ED Provider Notes (Signed)
[facility] Provider Note    Event Date/Time   First MD Initiated Contact with Patient 09/13/23 2229     (approximate)   History   Emesis   HPI  Claire Kane is a 82 y.o. female who presents to the emergency department today because of concerns for nausea and vomiting.  The patient states that symptoms started around midnight last night.  She states she has not been able to tolerate any significant p.o. and that she immediately vomits it.  Says been accompanied by some abdominal discomfort.  She last had a bowel movement last night and has not been passing significant gas today.  States she has a history of hysterectomy and cholecystectomy. Additionally has history of hiatal hernia for which she is scheduled for surgery next month.     Physical Exam   Triage Vital Signs: ED Triage Vitals  Encounter Vitals Group     BP 09/13/23 2118 (!) 158/76     Systolic BP Percentile --      Diastolic BP Percentile --      Pulse Rate 09/13/23 2118 (!) 106     Resp 09/13/23 2118 16     Temp 09/13/23 2118 98.6 F (37 C)     Temp Source 09/13/23 2227 Oral     SpO2 09/13/23 2118 94 %     Weight 09/13/23 2119 150 lb (68 kg)     Height 09/13/23 2119 5' 2.5" (1.588 m)     Head Circumference --      Peak Flow --      Pain Score 09/13/23 2119 6     Pain Loc --      Pain Education --      Exclude from Growth Chart --     Most recent vital signs: Vitals:   09/13/23 2118 09/13/23 2227  BP: (!) 158/76   Pulse: (!) 106   Resp: 16   Temp: 98.6 F (37 C) 99.1 F (37.3 C)  SpO2: 94%    General: Awake, alert, oriented. CV:  Good peripheral perfusion. Regular rate and rhythm. Resp:  Normal effort. Lungs clear. Abd:  No distention. Minimally tender to palpation in the epigastric region.   ED Results / Procedures / Treatments   Labs (all labs ordered are listed, but only abnormal results are displayed) Labs Reviewed  COMPREHENSIVE METABOLIC PANEL - Abnormal;  Notable for the following components:      Result Value   Potassium 2.8 (*)    Glucose, Bld 165 (*)    Total Bilirubin 1.7 (*)    All other components within normal limits  CBC - Abnormal; Notable for the following components:   Hemoglobin 11.8 (*)    HCT 35.1 (*)    Platelets 459 (*)    All other components within normal limits  LIPASE, BLOOD  URINALYSIS, ROUTINE W REFLEX MICROSCOPIC     EKG  None   RADIOLOGY I independently interpreted and visualized the CT abd/pel. My interpretation: dilated stomach Radiology interpretation:  IMPRESSION:  1. Large paraesophageal hiatal hernia with severe fluid distention  of the proximal stomach and herniated portion of the stomach.  Transition zone at the hernia with decompressed distal stomach.  Changes likely indicate gastric obstruction caused by the hernia.  2. Mild proximal jejunal wall thickening possibly enteritis.  3. Aortic atherosclerosis.      PROCEDURES:  Critical Care performed: No    MEDICATIONS ORDERED IN ED: Medications - No data to display  IMPRESSION / MDM / ASSESSMENT AND PLAN / ED COURSE  I reviewed the triage vital signs and the nursing notes.                              Differential diagnosis includes, but is not limited to, SBO, gastroenteritis, pancreatitis  Patient's presentation is most consistent with acute presentation with potential threat to life or bodily function.   Patient presented to the emergency department today because of concerns for nausea and vomiting that started last night.  On exam patient is minimally tender in the epigastric region.  Blood work without concerning leukocytosis or electrolyte abnormality.  CT scan was obtained given concern for possible obstruction.   CT scan is concerning for possible gastric obstruction. Discussed with Dr. Everlene Farrier with surgery. Recommended NG tube. Discussed findings and plan with patient. Will discuss with hospitalist service for admission.      FINAL CLINICAL IMPRESSION(S) / ED DIAGNOSES   Final diagnoses:  Hiatal hernia with obstruction but no gangrene      Note:  This document was prepared using Dragon voice recognition software and may include unintentional dictation errors.    Phineas Semen, MD 09/13/23 [id number]

## 2023-09-13 NOTE — ED Triage Notes (Signed)
Pt reports vomiting x20 hours pta. H/x hiatal hernia with surgery scheduled for January. Pt states that she is supposed to be eating small meals "but overdid it for Christmas". No diarrhea. Unable to hold anything down.

## 2023-09-14 ENCOUNTER — Inpatient Hospital Stay: Payer: Medicare PPO

## 2023-09-14 DIAGNOSIS — K440 Diaphragmatic hernia with obstruction, without gangrene: Secondary | ICD-10-CM

## 2023-09-14 DIAGNOSIS — G620 Drug-induced polyneuropathy: Secondary | ICD-10-CM | POA: Insufficient documentation

## 2023-09-14 DIAGNOSIS — E876 Hypokalemia: Secondary | ICD-10-CM

## 2023-09-14 LAB — BASIC METABOLIC PANEL WITH GFR
Anion gap: 10 (ref 5–15)
BUN: 18 mg/dL (ref 8–23)
CO2: 28 mmol/L (ref 22–32)
Calcium: 8.4 mg/dL — ABNORMAL LOW (ref 8.9–10.3)
Chloride: 100 mmol/L (ref 98–111)
Creatinine, Ser: 0.79 mg/dL (ref 0.44–1.00)
GFR, Estimated: >60 mL/min
Glucose, Bld: 136 mg/dL — ABNORMAL HIGH (ref 70–99)
Potassium: 2.9 mmol/L — ABNORMAL LOW (ref 3.5–5.1)
Sodium: 138 mmol/L (ref 135–145)

## 2023-09-14 LAB — URINALYSIS, ROUTINE W REFLEX MICROSCOPIC
Bacteria, UA: NONE SEEN
Bilirubin Urine: NEGATIVE
Glucose, UA: NEGATIVE mg/dL
Hgb urine dipstick: NEGATIVE
Ketones, ur: 20 mg/dL — AB
Nitrite: NEGATIVE
Protein, ur: NEGATIVE mg/dL
Specific Gravity, Urine: >1.046 — ABNORMAL HIGH (ref 1.005–1.030)
pH: 7 (ref 5.0–8.0)

## 2023-09-14 LAB — CBC
HCT: 30.7 % — ABNORMAL LOW (ref 36.0–46.0)
Hemoglobin: 10 g/dL — ABNORMAL LOW (ref 12.0–15.0)
MCH: 27.5 pg (ref 26.0–34.0)
MCHC: 32.6 g/dL (ref 30.0–36.0)
MCV: 84.3 fL (ref 80.0–100.0)
Platelets: 401 10*3/uL — ABNORMAL HIGH (ref 150–400)
RBC: 3.64 MIL/uL — ABNORMAL LOW (ref 3.87–5.11)
RDW: 13.9 % (ref 11.5–15.5)
WBC: 13.8 10*3/uL — ABNORMAL HIGH (ref 4.0–10.5)
nRBC: 0 % (ref 0.0–0.2)

## 2023-09-14 MED ORDER — ACETAMINOPHEN 325 MG PO TABS
650.0000 mg | ORAL_TABLET | Freq: Four times a day (QID) | ORAL | Status: DC | PRN
Start: 2023-09-14 — End: 2023-09-14

## 2023-09-14 MED ORDER — ACETAMINOPHEN 10 MG/ML IV SOLN
1000.0000 mg | Freq: Four times a day (QID) | INTRAVENOUS | Status: AC
  Administered 2023-09-14 – 2023-09-15 (×4): 1000 mg via INTRAVENOUS
  Filled 2023-09-14 (×5): qty 100

## 2023-09-14 MED ORDER — ONDANSETRON HCL 4 MG PO TABS
4.0000 mg | ORAL_TABLET | Freq: Four times a day (QID) | ORAL | Status: DC | PRN
Start: 2023-09-14 — End: 2023-09-15

## 2023-09-14 MED ORDER — POTASSIUM CHLORIDE 10 MEQ/100ML IV SOLN
10.0000 meq | INTRAVENOUS | Status: AC
  Administered 2023-09-14 (×2): 10 meq via INTRAVENOUS
  Filled 2023-09-14: qty 100

## 2023-09-14 MED ORDER — ACETAMINOPHEN 325 MG RE SUPP: 650.0000 mg | Freq: Four times a day (QID) | RECTAL | Status: DC | PRN

## 2023-09-14 MED ORDER — ENOXAPARIN SODIUM 40 MG/0.4ML IJ SOSY
40.0000 mg | PREFILLED_SYRINGE | INTRAMUSCULAR | Status: DC
  Administered 2023-09-14 – 2023-09-16 (×3): 40 mg via SUBCUTANEOUS
  Filled 2023-09-14 (×3): qty 0.4

## 2023-09-14 MED ORDER — ONDANSETRON HCL 4 MG/2ML IJ SOLN
4.0000 mg | Freq: Four times a day (QID) | INTRAMUSCULAR | Status: DC | PRN
  Administered 2023-09-14: 4 mg via INTRAVENOUS
  Filled 2023-09-14: qty 2

## 2023-09-14 MED ORDER — KCL IN DEXTROSE-NACL 40-5-0.9 MEQ/L-%-% IV SOLN
INTRAVENOUS | Status: AC
  Filled 2023-09-14 (×2): qty 1000

## 2023-09-14 MED ORDER — POTASSIUM CHLORIDE 10 MEQ/100ML IV SOLN
INTRAVENOUS | Status: AC
  Administered 2023-09-14: 10 meq via INTRAVENOUS
  Filled 2023-09-14: qty 100

## 2023-09-14 MED ORDER — MORPHINE SULFATE (PF) 2 MG/ML IV SOLN
2.0000 mg | INTRAVENOUS | Status: DC | PRN
  Administered 2023-09-14: 2 mg via INTRAVENOUS
  Filled 2023-09-14: qty 1

## 2023-09-14 MED ORDER — POTASSIUM CHLORIDE 10 MEQ/100ML IV SOLN: 10.0000 meq | INTRAVENOUS | Status: DC

## 2023-09-14 MED ORDER — POTASSIUM CHLORIDE 10 MEQ/100ML IV SOLN
10.0000 meq | INTRAVENOUS | Status: AC
  Administered 2023-09-14: 10 meq via INTRAVENOUS
  Filled 2023-09-14 (×2): qty 100

## 2023-09-14 NOTE — Assessment & Plan Note (Addendum)
Not currently on  gabapentin

## 2023-09-14 NOTE — H&P (Signed)
History and Physical    Patient: Claire Kane VWU:[id number] DOB: [birth year] DOA: 09/13/2023 DOS: the patient was seen and examined on 09/14/2023 PCP: Ronnald Ramp, MD  Patient coming from: Home  Chief Complaint:  Chief Complaint  Patient presents with   Emesis    HPI: ANU CAMILLERI is a 82 y.o. female with medical history significant for HTN, Breast cancer s/p lumpectomy and radiation (right 2002, left 2022) on Aromasin, chemotherapy-induced peripheral neuropathy, large symptomatic paraesophageal hernia, with upcoming surgery 10/10/2023 Who is being admitted with gastric outlet obstruction related to her hiatal hernia.  She presented with intractable vomiting that started around midnight on 12/24 with inability to tolerate anything orally.  She has associated abdominal discomfort.  Additionally she has had no bowel movements during the day and is not passing gas.  She denies fever or chills.  Has had prior cholecystectomy as well as hysterectomy. ED course and data review: Mildly tachycardic to 106 on arrival and slightly elevated blood pressure of 177/81 but otherwise normal vitals Notable findings on workup include the following: Lipase 30 and normal LFTs except for slightly elevated total bilirubin of 1.7 Potassium 2.8 WBC normal at 10 and hemoglobin at baseline at 11.8 CT abdomen and pelvis showing severe fluid distention proximal stomach as follows: IMPRESSION: 1. Large paraesophageal hiatal hernia with severe fluid distention of the proximal stomach and herniated portion of the stomach. Transition zone at the hernia with decompressed distal stomach. Changes likely indicate gastric obstruction caused by the hernia. 2. Mild proximal jejunal wall thickening possibly enteritis. 3. Aortic atherosclerosis.    The ED provider spoke with surgeon Dr. Everlene Farrier, who advised to treat as bowel obstruction and recommended NG tube. She was treated with a 1.5 L bolus and  [facility] Hospitalist consulted for admission.   Review of Systems: As mentioned in the history of present illness. All other systems reviewed and are negative.  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    Breast cancer (HCC) 09/2020   left breast   Carpal tunnel syndrome of right wrist 06/06/2023   Cataract    GERD (gastroesophageal reflux disease)    Heart murmur 2001   Hiatal hernia    High cholesterol    Hypertension    patient denies anyone telling her that she has htn   Malignant tumor of breast (HCC) 09/19/2020   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   Personal history of radiation therapy 2002, 2022   BREAST CA   Rosacea    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 complications    Past Surgical History:  Procedure Laterality Date   ABDOMINAL HYSTERECTOMY     BLEPHAROPLASTY     BREAST BIOPSY Left 09/15/2020   affirm bx, x marker, DCIS   BREAST BIOPSY Left 09/07/2022   Left Breast Stereo Bx X clip- path pending   BREAST BIOPSY Left 09/07/2022   MM LT BREAST BX W LOC DEV 1ST LESION IMAGE BX SPEC STEREO GUIDE 09/07/2022 [facility]   BREAST CYST ASPIRATION Left 2003   BREAST EXCISIONAL BIOPSY Right 2002   POS   BREAST LUMPECTOMY Right 2002   w/ radiation   BREAST LUMPECTOMY Left 10/07/2020   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   BREAST LUMPECTOMY Left    re-excision   BREAST LUMPECTOMY WITH NEEDLE LOCALIZATION Left 10/07/2020   Procedure: BREAST LUMPECTOMY WITH NEEDLE LOCALIZATION;  Surgeon: Earline Mayotte, MD;  Location: [facility];  Service: General;  Laterality: Left;   BREAST SURGERY Right 2002   lumpectomy   CATARACT EXTRACTION W/ INTRAOCULAR LENS  IMPLANT, BILATERAL     CHOLECYSTECTOMY      COLONOSCOPY  2013   COLONOSCOPY WITH PROPOFOL N/A 12/08/2015   Procedure: COLONOSCOPY WITH PROPOFOL;  Surgeon: Kieth Brightly, MD;  Location: [facility] ENDOSCOPY;  Service: Endoscopy;  Laterality: N/A;   COLONOSCOPY WITH PROPOFOL N/A 12/06/2017   Procedure: COLONOSCOPY WITH PROPOFOL;  Surgeon: Wyline Mood, MD;  Location: [facility] ENDOSCOPY;  Service: Gastroenterology;  Laterality: N/A;   COLONOSCOPY WITH PROPOFOL N/A 12/22/2017   Procedure: COLONOSCOPY WITH PROPOFOL;  Surgeon: Wyline Mood, MD;  Location: [facility];  Service: Gastroenterology;  Laterality: N/A;   ESOPHAGOGASTRODUODENOSCOPY (EGD) WITH PROPOFOL N/A 12/08/2015   Procedure: ESOPHAGOGASTRODUODENOSCOPY (EGD) WITH PROPOFOL;  Surgeon: Kieth Brightly, MD;  Location: [facility] ENDOSCOPY;  Service: Endoscopy;  Laterality: N/A;   ESOPHAGOGASTRODUODENOSCOPY (EGD) WITH PROPOFOL N/A 12/06/2017   Procedure: ESOPHAGOGASTRODUODENOSCOPY (EGD) WITH PROPOFOL;  Surgeon: Wyline Mood, MD;  Location: [facility] ENDOSCOPY;  Service: Gastroenterology;  Laterality: N/A;   ESOPHAGOGASTRODUODENOSCOPY (EGD) WITH PROPOFOL N/A 12/22/2017   Procedure: ESOPHAGOGASTRODUODENOSCOPY (EGD) WITH PROPOFOL;  Surgeon: Wyline Mood, MD;  Location: [facility] ENDOSCOPY;  Service: Gastroenterology;  Laterality: N/A;   ESOPHAGOGASTRODUODENOSCOPY (EGD) WITH PROPOFOL N/A 08/30/2023   Procedure: ESOPHAGOGASTRODUODENOSCOPY (EGD) WITH PROPOFOL;  Surgeon: Wyline Mood, MD;  Location: [facility] ENDOSCOPY;  Service: Gastroenterology;  Laterality: N/A;   EYE SURGERY Right 2014   EYE SURGERY Left 2014   GIVENS CAPSULE STUDY N/A 01/23/2018   Procedure: GIVENS CAPSULE STUDY;  Surgeon: Wyline Mood, MD;  Location: [facility] ENDOSCOPY;  Service: Gastroenterology;  Laterality: N/A;   iron infusion     had 5 iron infusions for anemia   post radiation therapy Right    right breast cancer 2002   RE-EXCISION OF BREAST CANCER,SUPERIOR MARGINS Left 11/09/2020   Procedure: RE-EXCISION OF BREAST CANCER,SUPERIOR  MARGINS;  Surgeon: Earline Mayotte, MD;  Location: [facility] ORS;  Service: General;  Laterality: Left;   Stab pheblectomy   2010   vein closure procedure Bilateral 2008   Social History:  reports that she has never smoked. She has never been exposed to tobacco smoke. She has never used smokeless tobacco. She reports that she does not drink alcohol and does not use drugs.  Allergies  Allergen Reactions   Zometa [Zoledronic Acid]     Severe back pain    Family History  Problem Relation Age of Onset   Heart attack Mother    Heart attack Father    Heart attack Sister    Stroke Sister    Heart attack Brother    Other Brother        cerebral hemorrhage, sepsis   Breast cancer Cousin 50       breast/maternal   Cancer Cousin 60       breast/maternal   Breast cancer Cousin    Breast cancer Maternal Aunt 10   Breast cancer Other 48       maternal neice    Prior to Admission medications   Medication Sig Start Date End Date Taking? Authorizing Provider  amLODipine (NORVASC) 5 MG tablet TAKE 1 TABLET EVERY DAY 03/06/23  Yes Simmons-Robinson, Tawanna Cooler, MD  atorvastatin (LIPITOR) 10 MG tablet TAKE 1 TABLET EVERY DAY 10/10/22   Simmons-Robinson, Tawanna Cooler, MD  calcium citrate (CALCITRATE - DOSED IN MG ELEMENTAL CALCIUM) 950 MG tablet Take 200 mg of elemental calcium by mouth 2 (two) times daily.     [provider]  clotrimazole-betamethasone (LOTRISONE) cream Apply 1 Application topically 2 (two) times daily. 01/17/23   Allie Bossier, MD  docusate sodium (COLACE) 100 MG capsule Take 100 mg by mouth daily as needed for mild constipation.    [provider]  exemestane (AROMASIN) 25 MG tablet TAKE 1 TABLET EVERY DAY AFTER BREAKFAST 03/06/23   [name], MD  ezetimibe (ZETIA) 10 MG tablet TAKE 1 TABLET EVERY DAY 03/06/23   Simmons-Robinson, Tawanna Cooler, MD  Glucosamine 750 MG TABS Take 750 mg by mouth daily.    [provider]  Glycerin-Polysorbate 80 (REFRESH DRY EYE THERAPY OP)  Place 1 drop into both eyes 2 (two) times daily.    [provider]  Multiple Vitamins-Minerals (MULTIVITAMIN WITH MINERALS) tablet Take 1 tablet by mouth daily.    [provider]  omeprazole (PRILOSEC) 40 MG capsule Take 1 capsule (40 mg total) by mouth daily. 07/26/23 07/20/24  Celso Amy, PA-C  simethicone (MYLICON) 80 MG chewable tablet Chew 80 mg by mouth every 6 (six) hours as needed for flatulence.    [provider]  solifenacin (VESICARE) 10 MG tablet Take 1 tablet (10 mg total) by mouth daily. 01/17/23   Dove, Leanora Ivanoff, MD  SOOLANTRA 1 % CREA Apply 1 application  topically daily as needed (rosacea). 06/30/16   [provider]  spironolactone (ALDACTONE) 50 MG tablet Take 50 mg by mouth daily. 12/29/14   [provider]  traMADol (ULTRAM) 50 MG tablet Take 1 tablet (50 mg total) by mouth every 12 (twelve) hours as needed. 08/15/23   Ronnald Ramp, MD    Physical Exam: Vitals:   09/13/23 2118 09/13/23 2119 09/13/23 2215 09/13/23 2227  BP: (!) 158/76  (!) 177/81   Pulse: (!) 106  93   Resp: 16  18   Temp: 98.6 F (37 C)   99.1 F (37.3 C)  TempSrc:    Oral  SpO2: 94%  99%   Weight:  68 kg    Height:  5' 2.5" (1.588 m)     Physical Exam Vitals and nursing note reviewed.  Constitutional:      General: She is not in acute distress. HENT:     Head: Normocephalic and atraumatic.  Cardiovascular:     Rate and Rhythm: Normal rate and regular rhythm.     Heart sounds: Normal heart sounds.  Pulmonary:     Effort: Pulmonary effort is normal.     Breath sounds: Normal breath sounds.  Abdominal:     Palpations: Abdomen is soft.     Tenderness: There is no abdominal tenderness.  Neurological:     Mental Status: Mental status is at baseline.     Labs on Admission: I have personally reviewed following labs and imaging studies  CBC: Recent Labs  Lab 09/13/23 2125  WBC 10.0  HGB 11.8*  HCT 35.1*  MCV 83.2  PLT 459*    Basic Metabolic Panel: Recent Labs  Lab 09/13/23 2125  NA 139  K 2.8*  CL 98  CO2 26  GLUCOSE 165*  BUN 17  CREATININE 0.73  CALCIUM 9.5   GFR: Estimated Creatinine Clearance: 49.6 mL/min (by C-G formula based on SCr of 0.73 mg/dL). Liver Function  Tests: Recent Labs  Lab 09/13/23 2125  AST 25  ALT 20  ALKPHOS 54  BILITOT 1.7*  PROT 8.0  ALBUMIN 4.7   Recent Labs  Lab 09/13/23 2125  LIPASE 30   No results for input(s): "AMMONIA" in the last 168 hours. Coagulation Profile: No results for input(s): "INR", "PROTIME" in the last 168 hours. Cardiac Enzymes: No results for input(s): "CKTOTAL", "CKMB", "CKMBINDEX", "TROPONINI" in the last 168 hours. BNP (last 3 results) No results for input(s): "PROBNP" in the last 8760 hours. HbA1C: No results for input(s): "HGBA1C" in the last 72 hours. CBG: No results for input(s): "GLUCAP" in the last 168 hours. Lipid Profile: No results for input(s): "CHOL", "HDL", "LDLCALC", "TRIG", "CHOLHDL", "LDLDIRECT" in the last 72 hours. Thyroid Function Tests: No results for input(s): "TSH", "T4TOTAL", "FREET4", "T3FREE", "THYROIDAB" in the last 72 hours. Anemia Panel: No results for input(s): "VITAMINB12", "FOLATE", "FERRITIN", "TIBC", "IRON", "RETICCTPCT" in the last 72 hours. Urine analysis:    Component Value Date/Time   APPEARANCEUR Clear 11/29/2017 1512   GLUCOSEU Negative 11/29/2017 1512   BILIRUBINUR neg 01/10/2018 1447   BILIRUBINUR Negative 11/29/2017 1512   PROTEINUR neg 01/10/2018 1447   PROTEINUR Negative 11/29/2017 1512   UROBILINOGEN 0.2 01/10/2018 1447   NITRITE neg 01/10/2018 1447   NITRITE Negative 11/29/2017 1512   LEUKOCYTESUR Negative 01/10/2018 1447   LEUKOCYTESUR 3+ (A) 11/29/2017 1512    Radiological Exams on Admission: CT ABDOMEN PELVIS W CONTRAST Result Date: 09/13/2023 CLINICAL DATA:  Nausea and vomiting. Hiatal hernia with surgery scheduled for January. EXAM: CT ABDOMEN AND PELVIS WITH CONTRAST  TECHNIQUE: Multidetector CT imaging of the abdomen and pelvis was performed using the standard protocol following bolus administration of intravenous contrast. RADIATION DOSE REDUCTION: This exam was performed according to the departmental dose-optimization program which includes automated exposure control, adjustment of the mA and/or kV according to patient size and/or use of iterative reconstruction technique. CONTRAST:  OMNIPAQUE IOHEXOL 300 MG/ML  SOLN COMPARISON:  08/11/2023 FINDINGS: Lower chest: Motion artifact limits examination. Lung bases are clear. There is a large fluid distended esophageal hiatal hernia measuring 5.5 x 9.5 cm in diameter. Size is increased since prior study. Configuration is consistent with a para esophageal hiatal hernia. Fluid is demonstrated within the distal esophagus without significant distention. Mild esophageal wall thickening may indicate reflux disease. Hepatobiliary: No focal liver abnormality is seen. Status post cholecystectomy. No biliary dilatation. Pancreas: Unremarkable. No pancreatic ductal dilatation or surrounding inflammatory changes. Spleen: Normal in size without focal abnormality. Adrenals/Urinary Tract: Adrenal glands are unremarkable. Kidneys are normal, without renal calculi, focal lesion, or hydronephrosis. Bladder is unremarkable. Stomach/Bowel: The intrathoracic stomach and proximal stomach are markedly distended and fluid-filled. The distal limb of the stomach as it exits from the hiatal hernia is diffusely decompressed. Changes are consistent with gastric obstruction. Gastric pylorus, duodenal, and small bowel are decompressed. Proximal jejunum demonstrate wall thickening with mild mesenteric infiltration. This may indicate enteritis. No pneumatosis is demonstrated. Mesenteric vessels appear patent. Stool-filled colon without abnormal distention or wall thickening. Appendix is normal. Vascular/Lymphatic: Aortic atherosclerosis. No enlarged abdominal  or pelvic lymph nodes. Reproductive: Vaginal pessary is in place. Uterus and ovaries appear surgically absent. Other: No free air or free fluid in the abdomen. Abdominal wall musculature appears intact. Musculoskeletal: Degenerative changes in the spine. No acute bony abnormalities. IMPRESSION: 1. Large paraesophageal hiatal hernia with severe fluid distention of the proximal stomach and herniated portion of the stomach. Transition zone at the hernia with decompressed distal  stomach. Changes likely indicate gastric obstruction caused by the hernia. 2. Mild proximal jejunal wall thickening possibly enteritis. 3. Aortic atherosclerosis. Electronically Signed   By: Burman Nieves M.D.   On: 09/13/2023 23:24     Data Reviewed: Relevant notes from primary care and specialist visits, past discharge summaries as available in EHR, including Care Everywhere. Prior diagnostic testing as pertinent to current admission diagnoses Updated medications and problem lists for reconciliation ED course, including vitals, labs, imaging, treatment and response to treatment Triage notes, nursing and pharmacy notes and ED provider's notes Notable results as noted in HPI   Assessment and Plan: * Obstruction concurrent with and due to paraesophageal hernia Continue gastric decompression with NG tube to low intermittent suction Aspiration precautions Surgeon, Dr. Everlene Farrier consulted IV hydration as patient will be n.p.o.  Hypokalemia Secondary to GI losses from vomiting IV repletion and monitor  Chemotherapy-induced neuropathy (HCC) Not currently on  gabapentin   HTN (hypertension) Controlled. Iv hydralazine prn while npo  Ductal carcinoma in situ (DCIS) of left breast No acute issues suspected s/p lumpectomy and radiation (right 2002, left 2022) on Aromasin Holding Aromasin for now Has upcoming biopsy on 09/15/23 which will need to be rescheduled    DVT prophylaxis: Lovenox  Consults: surgery, Dr  pabon  Advance Care Planning: full code  Family Communication: con at bedside  Disposition Plan: Back to previous home environment  Severity of Illness: The appropriate patient status for this patient is INPATIENT. Inpatient status is judged to be reasonable and necessary in order to provide the required intensity of service to ensure the patient's safety. The patient's presenting symptoms, physical exam findings, and initial radiographic and laboratory data in the context of their chronic comorbidities is felt to place them at high risk for further clinical deterioration. Furthermore, it is not anticipated that the patient will be medically stable for discharge from the hospital within 2 midnights of admission.   * I certify that at the point of admission it is my clinical judgment that the patient will require inpatient hospital care spanning beyond 2 midnights from the point of admission due to high intensity of service, high risk for further deterioration and high frequency of surveillance required.*  Author: Andris Baumann, MD 09/14/2023 12:29 AM  For on call review [email or web address].

## 2023-09-14 NOTE — Progress Notes (Signed)
Brief rounding note, same day as admission  HPI: Pt admitted after midnight with gastric outlet obstruction due to know giant paraesophageal hernia with plans for outpatient surgery on 10/10/23. General surgery consulted in the ED. NG tube placed to suction and patient is feeling better. See H&P for full HPI on admission & ED course.   Interval history: Pt seen in ED holding for a bed, daughter at bedside. Pt reports feeling much better with NG tube on suction and abdomen decompressed.  Denies acute complaints including abdominal pain, nausea/vomiting.  Not passing gas or stool.  Exam: General exam: awake, alert, no acute distress HEENT: NG tube in place to wall suction, moist mucus membranes, hearing grossly normal  Respiratory system: CTAB diminished bases, no wheezes, rales or rhonchi, normal respiratory effort. Cardiovascular system: normal S1/S2, RRR  Gastrointestinal system: soft, NT, ND, hypoactive bowel sounds Central nervous system: A&O x3. no gross focal neurologic deficits, normal speech Extremities: moves all , no edema, normal tone Skin: dry, intact, normal temperature, normal color, No rashes, lesions or ulcers Psychiatry: normal mood, congruent affect, judgement and insight appear normal   A&P: as per H&P by Dr. Para March and per General Surgery, with any changes or additions as below:   Hypokalemia - continue replacement with IV fluid additive K 2.8 >> 2.9  Leukocytosis - afebrile, no symptoms of infection.  Suspect reactive from obstruction and NG tube placement --Follow CBC's --Monitor closely for s/sx's of infection     No charge

## 2023-09-14 NOTE — Assessment & Plan Note (Signed)
Controlled. Iv hydralazine prn while npo

## 2023-09-14 NOTE — Consult Note (Signed)
Patient ID: Claire Kane, female   DOB: 06-Jan-1941, 82 y.o.   MRN: [id number]  HPI Claire Kane is a 82 y.o. female in in consultation at the request of Dr. Derrill Kay.  Came in last night with nausea vomiting and abdominal pain.  She states that the pain was in the epigastric area sharp and moderate intensity.  She did have multiple bouts of emesis.  Fevers no chills.  There is no specific alleviating or aggravating factors.  She did have a CT scan that I personally reviewed showing a giant paraesophageal hernia with signs of gastric outlet obstructions without any sign for gastric ischemia.  He did have prior workup showing no evidence of his strictures.  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    Breast cancer (HCC) 09/2020   left breast   Carpal tunnel syndrome of right wrist 06/06/2023   Cataract    GERD (gastroesophageal reflux disease)    Heart murmur 2001   Hiatal hernia    High cholesterol    Hypertension    patient denies anyone telling her that she has htn   Malignant tumor of breast (HCC) 09/19/2020   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   Personal history of radiation therapy 2002, 2022   BREAST CA   Rosacea    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 complications     Past Surgical History:  Procedure Laterality Date   ABDOMINAL HYSTERECTOMY     BLEPHAROPLASTY     BREAST BIOPSY Left 09/15/2020   affirm bx, x marker, DCIS   BREAST BIOPSY Left 09/07/2022   Left Breast Stereo Bx X clip- path pending   BREAST BIOPSY Left 09/07/2022   MM LT BREAST BX W LOC DEV 1ST LESION IMAGE BX  SPEC STEREO GUIDE 09/07/2022 [facility]   BREAST CYST ASPIRATION Left 2003   BREAST EXCISIONAL BIOPSY Right 2002   POS   BREAST LUMPECTOMY Right 2002   w/ radiation   BREAST LUMPECTOMY Left 10/07/2020   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   BREAST LUMPECTOMY Left    re-excision   BREAST LUMPECTOMY WITH NEEDLE LOCALIZATION Left 10/07/2020   Procedure: BREAST LUMPECTOMY WITH NEEDLE LOCALIZATION;  Surgeon: Earline Mayotte, MD;  Location: [facility];  Service: General;  Laterality: Left;   BREAST SURGERY Right 2002   lumpectomy   CATARACT EXTRACTION W/ INTRAOCULAR LENS  IMPLANT, BILATERAL     CHOLECYSTECTOMY     COLONOSCOPY  2013   COLONOSCOPY WITH PROPOFOL N/A 12/08/2015   Procedure: COLONOSCOPY WITH PROPOFOL;  Surgeon: Kieth Brightly, MD;  Location: [facility] ENDOSCOPY;  Service: Endoscopy;  Laterality: N/A;   COLONOSCOPY WITH PROPOFOL N/A 12/06/2017   Procedure: COLONOSCOPY WITH PROPOFOL;  Surgeon: Wyline Mood, MD;  Location: [facility] ENDOSCOPY;  Service: Gastroenterology;  Laterality: N/A;   COLONOSCOPY WITH PROPOFOL N/A 12/22/2017   Procedure: COLONOSCOPY WITH PROPOFOL;  Surgeon: Wyline Mood, MD;  Location: [facility] ENDOSCOPY;  Service: Gastroenterology;  Laterality: N/A;  ESOPHAGOGASTRODUODENOSCOPY (EGD) WITH PROPOFOL N/A 12/08/2015   Procedure: ESOPHAGOGASTRODUODENOSCOPY (EGD) WITH PROPOFOL;  Surgeon: Kieth Brightly, MD;  Location: [facility] ENDOSCOPY;  Service: Endoscopy;  Laterality: N/A;   ESOPHAGOGASTRODUODENOSCOPY (EGD) WITH PROPOFOL N/A 12/06/2017   Procedure: ESOPHAGOGASTRODUODENOSCOPY (EGD) WITH PROPOFOL;  Surgeon: Wyline Mood, MD;  Location: [facility] ENDOSCOPY;  Service: Gastroenterology;  Laterality: N/A;   ESOPHAGOGASTRODUODENOSCOPY (EGD) WITH PROPOFOL N/A 12/22/2017   Procedure: ESOPHAGOGASTRODUODENOSCOPY (EGD) WITH PROPOFOL;  Surgeon: Wyline Mood, MD;  Location: [facility] ENDOSCOPY;  Service:  Gastroenterology;  Laterality: N/A;   ESOPHAGOGASTRODUODENOSCOPY (EGD) WITH PROPOFOL N/A 08/30/2023   Procedure: ESOPHAGOGASTRODUODENOSCOPY (EGD) WITH PROPOFOL;  Surgeon: Wyline Mood, MD;  Location: [facility] ENDOSCOPY;  Service: Gastroenterology;  Laterality: N/A;   EYE SURGERY Right 2014   EYE SURGERY Left 2014   GIVENS CAPSULE STUDY N/A 01/23/2018   Procedure: GIVENS CAPSULE STUDY;  Surgeon: Wyline Mood, MD;  Location: [facility] ENDOSCOPY;  Service: Gastroenterology;  Laterality: N/A;   iron infusion     had 5 iron infusions for anemia   post radiation therapy Right    right breast cancer 2002   RE-EXCISION OF BREAST CANCER,SUPERIOR MARGINS Left 11/09/2020   Procedure: RE-EXCISION OF BREAST CANCER,SUPERIOR MARGINS;  Surgeon: Earline Mayotte, MD;  Location: [facility] ORS;  Service: General;  Laterality: Left;   Stab pheblectomy   2010   vein closure procedure Bilateral 2008    Family History  Problem Relation Age of Onset   Heart attack Mother    Heart attack Father    Heart attack Sister    Stroke Sister    Heart attack Brother    Other Brother        cerebral hemorrhage, sepsis   Breast cancer Cousin 50       breast/maternal   Cancer Cousin 60       breast/maternal   Breast cancer Cousin    Breast cancer Maternal Aunt 75   Breast cancer Other 48       maternal neice    Social History Social History   Tobacco Use   Smoking status: Never    Passive exposure: Never   Smokeless tobacco: Never  Vaping Use   Vaping status: Never Used  Substance Use Topics   Alcohol use: No   Drug use: No    Allergies  Allergen Reactions   Zometa [Zoledronic Acid]     Severe back pain    Current Facility-Administered Medications  Medication Dose Route Frequency Provider Last Rate Last Admin   acetaminophen (TYLENOL) tablet 650 mg  650 mg Per Tube Q6H PRN Andris Baumann, MD       Or   acetaminophen (TYLENOL) suppository 650 mg  650 mg Rectal Q6H PRN Andris Baumann, MD       dextrose 5 %  and 0.9 % NaCl with KCl 40 mEq/L infusion   Intravenous Continuous Andris Baumann, MD 75 mL/hr at 09/14/23 0155 New Bag at 09/14/23 0155   enoxaparin (LOVENOX) injection 40 mg  40 mg Subcutaneous Q24H Lindajo Royal V, MD       morphine (PF) 2 MG/ML injection 2 mg  2 mg Intravenous Q2H PRN Andris Baumann, MD       ondansetron [facility]) tablet 4 mg  4 mg Per Tube Q6H PRN Andris Baumann, MD       Or   ondansetron [facility]) injection 4 mg  4 mg Intravenous Q6H PRN Andris Baumann, MD       Current Outpatient Medications  Medication  Sig Dispense Refill   amLODipine (NORVASC) 5 MG tablet TAKE 1 TABLET EVERY DAY 90 tablet 3   atorvastatin (LIPITOR) 10 MG tablet TAKE 1 TABLET EVERY DAY 90 tablet 3   calcium citrate (CALCITRATE - DOSED IN MG ELEMENTAL CALCIUM) 950 MG tablet Take 200 mg of elemental calcium by mouth 2 (two) times daily.      clotrimazole-betamethasone (LOTRISONE) cream Apply 1 Application topically 2 (two) times daily. 30 g 6   docusate sodium (COLACE) 100 MG capsule Take 100 mg by mouth daily as needed for mild constipation.     exemestane (AROMASIN) 25 MG tablet TAKE 1 TABLET EVERY DAY AFTER BREAKFAST 90 tablet 3   ezetimibe (ZETIA) 10 MG tablet TAKE 1 TABLET EVERY DAY 90 tablet 3   Glucosamine 750 MG TABS Take 750 mg by mouth daily.     Glycerin-Polysorbate 80 (REFRESH DRY EYE THERAPY OP) Place 1 drop into both eyes 2 (two) times daily.     Multiple Vitamins-Minerals (MULTIVITAMIN WITH MINERALS) tablet Take 1 tablet by mouth daily.     omeprazole (PRILOSEC) 40 MG capsule Take 1 capsule (40 mg total) by mouth daily. 90 capsule 3   simethicone (MYLICON) 80 MG chewable tablet Chew 80 mg by mouth every 6 (six) hours as needed for flatulence.     solifenacin (VESICARE) 10 MG tablet Take 1 tablet (10 mg total) by mouth daily. 90 tablet 3   SOOLANTRA 1 % CREA Apply 1 application  topically daily as needed (rosacea).     spironolactone (ALDACTONE) 50 MG tablet Take 50 mg by mouth daily.      traMADol (ULTRAM) 50 MG tablet Take 1 tablet (50 mg total) by mouth every 12 (twelve) hours as needed. 60 tablet 0   Facility-Administered Medications Ordered in Other Encounters  Medication Dose Route Frequency Provider Last Rate Last Admin   0.9 %  sodium chloride infusion   Intravenous Once [name], MD         Review of Systems Full ROS  was asked and was negative except for the information on the HPI  Physical Exam Blood pressure 122/60, pulse 78, temperature 99.1 F (37.3 C), temperature source Oral, resp. rate 15, height 5' 2.5" (1.588 m), weight 68 kg, SpO2 98%. CONSTITUTIONAL: NAD. EYES: Pupils are equal, round, Sclera are non-icteric. EARS, NOSE, MOUTH AND THROAT: The oropharynx is clear. The oral mucosa is pink and moist. Hearing is intact to voice. LYMPH NODES:  Lymph nodes in the neck are normal. RESPIRATORY:  Lungs are clear. There is normal respiratory effort, with equal breath sounds bilaterally, and without pathologic use of accessory muscles. CARDIOVASCULAR: Heart is regular without murmurs, gallops, or rubs. GI: The abdomen is  soft, mildly distended nontender, and nondistended. There are no palpable masses. There is no hepatosplenomegaly. There are normal bowel sounds  GU: Rectal deferred.   MUSCULOSKELETAL: Normal muscle strength and tone. No cyanosis or edema.   SKIN: Turgor is good and there are no pathologic skin lesions or ulcers. NEUROLOGIC: Motor and sensation is grossly normal. Cranial nerves are grossly intact. PSYCH:  Oriented to person, place and time. Affect is normal.  Data Reviewed  I have personally reviewed the patient's imaging, laboratory findings and medical records.    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  Continue NG decompression and fluid resuscitation with correction of electrolytes.  No need for surgical intervention.  Discussed with the patient in detail about her disease process.  Do  think that  she wishes to wait for final surgical intervention that she is scheduled for in a couple weeks.  I do think that that is reasonable.  No need for emergent surgical intervention. She understands that if she were not to improve with decompression we may have to proceed with more urgent paraesophageal hernia repair.   Sterling Big, MD FACS General Surgeon 09/14/2023, 7:27 AM

## 2023-09-14 NOTE — ED Notes (Signed)
Pt has been given mouth swabs for mouth and oral care products

## 2023-09-14 NOTE — Assessment & Plan Note (Addendum)
No acute issues suspected s/p lumpectomy and radiation (right 2002, left 2022) on Aromasin Holding Aromasin for now Has upcoming biopsy on 09/15/23 which will need to be rescheduled

## 2023-09-14 NOTE — Assessment & Plan Note (Addendum)
Continue gastric decompression with NG tube to low intermittent suction Aspiration precautions Surgeon, Dr. Everlene Farrier consulted IV hydration as patient will be n.p.o.

## 2023-09-14 NOTE — Assessment & Plan Note (Signed)
Secondary to GI losses from vomiting IV repletion and monitor

## 2023-09-15 ENCOUNTER — Inpatient Hospital Stay: Admission: RE | Admit: 2023-09-15 | Payer: Medicare PPO | Source: Ambulatory Visit

## 2023-09-15 ENCOUNTER — Inpatient Hospital Stay: Payer: Medicare PPO

## 2023-09-15 DIAGNOSIS — K440 Diaphragmatic hernia with obstruction, without gangrene: Secondary | ICD-10-CM | POA: Diagnosis not present

## 2023-09-15 LAB — BASIC METABOLIC PANEL
Anion gap: 10 (ref 5–15)
BUN: 13 mg/dL (ref 8–23)
CO2: 25 mmol/L (ref 22–32)
Calcium: 8.3 mg/dL — ABNORMAL LOW (ref 8.9–10.3)
Chloride: 107 mmol/L (ref 98–111)
Creatinine, Ser: 0.65 mg/dL (ref 0.44–1.00)
GFR, Estimated: >60 mL/min (ref >60)
Glucose, Bld: 95 mg/dL (ref 70–99)
Potassium: 3.1 mmol/L — ABNORMAL LOW (ref 3.5–5.1)
Sodium: 142 mmol/L (ref 135–145)

## 2023-09-15 LAB — CBC
HCT: 32.7 % — ABNORMAL LOW (ref 36.0–46.0)
Hemoglobin: 10.3 g/dL — ABNORMAL LOW (ref 12.0–15.0)
MCH: 28 pg (ref 26.0–34.0)
MCHC: 31.5 g/dL (ref 30.0–36.0)
MCV: 88.9 fL (ref 80.0–100.0)
Platelets: 350 10*3/uL (ref 150–400)
RBC: 3.68 MIL/uL — ABNORMAL LOW (ref 3.87–5.11)
RDW: 13.9 % (ref 11.5–15.5)
WBC: 8.4 10*3/uL (ref 4.0–10.5)
nRBC: 0 % (ref 0.0–0.2)

## 2023-09-15 LAB — MAGNESIUM: Magnesium: 2.3 mg/dL (ref 1.7–2.4)

## 2023-09-15 MED ORDER — POTASSIUM CHLORIDE 10 MEQ/100ML IV SOLN
10.0000 meq | INTRAVENOUS | Status: AC
  Administered 2023-09-15 (×6): 10 meq via INTRAVENOUS
  Filled 2023-09-15 (×6): qty 100

## 2023-09-15 MED ORDER — MORPHINE SULFATE (PF) 2 MG/ML IV SOLN: 2.0000 mg | INTRAVENOUS | Status: DC | PRN

## 2023-09-15 MED ORDER — TRAZODONE HCL 50 MG PO TABS: 50.0000 mg | ORAL_TABLET | Freq: Every day | ORAL | Status: DC

## 2023-09-15 MED ORDER — WITCH HAZEL-GLYCERIN EX PADS
MEDICATED_PAD | CUTANEOUS | Status: DC | PRN
  Filled 2023-09-15: qty 100

## 2023-09-15 MED ORDER — KCL IN DEXTROSE-NACL 40-5-0.9 MEQ/L-%-% IV SOLN
INTRAVENOUS | Status: DC
  Filled 2023-09-15 (×2): qty 1000

## 2023-09-15 MED ORDER — ONDANSETRON HCL 4 MG PO TABS: 4.0000 mg | ORAL_TABLET | Freq: Four times a day (QID) | ORAL | Status: DC | PRN

## 2023-09-15 MED ORDER — ACETAMINOPHEN 10 MG/ML IV SOLN
1000.0000 mg | Freq: Four times a day (QID) | INTRAVENOUS | Status: DC
  Filled 2023-09-15 (×3): qty 100

## 2023-09-15 MED ORDER — ONDANSETRON HCL 4 MG/2ML IJ SOLN: 4.0000 mg | Freq: Four times a day (QID) | INTRAMUSCULAR | Status: DC | PRN

## 2023-09-15 MED ORDER — TRAZODONE HCL 50 MG PO TABS
50.0000 mg | ORAL_TABLET | Freq: Every day | ORAL | Status: DC
  Administered 2023-09-15: 50 mg via ORAL
  Filled 2023-09-15: qty 1

## 2023-09-15 NOTE — Progress Notes (Signed)
PROGRESS NOTE  Claire Kane    DOB: [birth year], 82 y.o.  ZOX:[id number]    Code Status: Full Code   DOA: 09/13/2023   LOS: 2   Brief hospital course  Claire Kane is a 82 y.o. female with medical history significant for HTN, Breast cancer s/p lumpectomy and radiation (right 2002, left 2022) on Aromasin, chemotherapy-induced peripheral neuropathy, large symptomatic paraesophageal hernia, with upcoming surgery 10/10/2023.  Admitted 12/26 with gastric outlet obstruction related to her known hiatal hernia.  ED course and data review: Mildly tachycardic to 106 on arrival and slightly elevated blood pressure of 177/81 but otherwise normal vitals Notable findings on workup include the following: Lipase 30 and normal LFTs except for slightly elevated total bilirubin of 1.7 Potassium 2.8 WBC normal at 10 and hemoglobin at baseline at 11.8 CT abdomen and pelvis showing severe fluid distention proximal stomach as follows: IMPRESSION: 1. Large paraesophageal hiatal hernia with severe fluid distention of the proximal stomach and herniated portion of the stomach. Transition zone at the hernia with decompressed distal stomach. Changes likely indicate gastric obstruction caused by the hernia. 2. Mild proximal jejunal wall thickening possibly enteritis. 3. Aortic atherosclerosis.    The ED provider spoke with surgeon Dr. Everlene Farrier, who advised to treat as bowel obstruction and recommended NG tube. She was treated with a 1.5 L bolus and Zofran  09/15/23 -improved nausea, abdominal pain  Assessment & Plan  Principal Problem:   Obstruction concurrent with and due to paraesophageal hernia Active Problems:   Hypokalemia   Ductal carcinoma in situ (DCIS) of left breast   HTN (hypertension)   Hiatal hernia with obstruction but no gangrene   Chemotherapy-induced neuropathy (HCC)  Obstruction concurrent with and due to paraesophageal hernia Has been on NG suction since admission. With improving symptoms  and KUB monitoring, general surgery recommended advancing to CLD and dc NG.  - monitor on CLD - strict I/O - avoid oipioids   Hypokalemia- improving. S/p IV repletion - repeat BMP am    Chemotherapy-induced neuropathy (HCC) Not currently on  gabapentin    HTN (hypertension) Controlled.   Ductal carcinoma in situ (DCIS) of left breast No acute issues suspected s/p lumpectomy and radiation (right 2002, left 2022) on Aromasin Holding Aromasin for now Has upcoming biopsy on 09/15/23 which will need to be rescheduled  Body mass index is 27 kg/m.  VTE ppx: enoxaparin (LOVENOX) injection 40 mg Start: 09/14/23 1000  Diet:     Diet   Diet NPO time specified   Consultants: General surgery   Subjective 09/15/23    Pt reports feeling improved. No current nausea or abdominal pain. Has not been passing gas or having BM yet.    Objective   Vitals:   09/14/23 2049 09/14/23 2100 09/15/23 0102 09/15/23 0118  BP: (!) 143/67 (!) 140/80 (!) 149/70 (!) 149/70  Pulse: 68   71  Resp:    18  Temp:    98.3 F (36.8 C)  TempSrc:    Oral  SpO2: 98%     Weight:      Height:        Intake/Output Summary (Last 24 hours) at 09/15/2023 0823 Last data filed at 09/15/2023 0705 Gross per 24 hour  Intake 800 ml  Output --  Net 800 ml   [facility] Weights   09/13/23 2119 09/14/23 1929  Weight: 68 kg 68 kg    Physical Exam:  General: awake, alert, NAD HEENT: atraumatic, clear conjunctiva, anicteric sclera, MMM, hearing  grossly normal Respiratory: normal respiratory effort. Cardiovascular: quick capillary refill, normal S1/S2, RRR, no JVD, murmurs Gastrointestinal: soft, NT, ND Nervous: A&O x3. no gross focal neurologic deficits, normal speech Extremities: moves all equally, no edema, normal tone Skin: dry, intact, normal temperature, normal color. No rashes, lesions or ulcers on exposed skin Psychiatry: normal mood, congruent affect  Labs   I have personally reviewed the following labs  and imaging studies CBC    Component Value Date/Time   WBC 8.4 09/15/2023 0602   RBC 3.68 (L) 09/15/2023 0602   HGB 10.3 (L) 09/15/2023 0602   HGB 11.8 07/26/2023 1354   HCT 32.7 (L) 09/15/2023 0602   HCT 37.8 07/26/2023 1354   PLT 350 09/15/2023 0602   PLT 447 07/26/2023 1354   MCV 88.9 09/15/2023 0602   MCV 89 07/26/2023 1354   MCH 28.0 09/15/2023 0602   MCHC 31.5 09/15/2023 0602   RDW 13.9 09/15/2023 0602   RDW 13.7 07/26/2023 1354   LYMPHSABS 1.7 07/26/2023 1354   MONOABS 0.9 03/22/2023 1239   EOSABS 0.0 07/26/2023 1354   BASOSABS 0.0 07/26/2023 1354      Latest Ref Rng & Units 09/15/2023    6:02 AM 09/14/2023    5:08 AM 09/13/2023    9:25 PM  BMP  Glucose 70 - 99 mg/dL 95  956  213   BUN 8 - 23 mg/dL 13  18  17    Creatinine 0.44 - 1.00 mg/dL 0.86  5.78  4.69   Sodium 135 - 145 mmol/L 142  138  139   Potassium 3.5 - 5.1 mmol/L 3.1  2.9  2.8   Chloride 98 - 111 mmol/L 107  100  98   CO2 22 - 32 mmol/L 25  28  26    Calcium 8.9 - 10.3 mg/dL 8.3  8.4  9.5     DG ABD ACUTE 2+V W 1V CHEST Result Date: 09/15/2023 CLINICAL DATA:  82 year old female with history of gastric outlet obstruction. EXAM: DG ABDOMEN ACUTE WITH 1 VIEW CHEST COMPARISON:  06/12/2023. FINDINGS: Coiled upon itself in the lower middle mediastinum, presumably within the patient's large hiatal hernia. Lung volumes are normal. No consolidative airspace disease. No pleural effusions. No pneumothorax. No evidence of pulmonary edema. Heart size is normal. The patient is rotated to the right on today's exam, resulting in distortion of the mediastinal contours and reduced diagnostic sensitivity and specificity for mediastinal pathology. Atherosclerotic calcifications are noted in the thoracic aorta. Gas and stool are seen scattered throughout the colon extending to the level of the distal rectum. No pathologic distension of small bowel is noted. No gross evidence of pneumoperitoneum. Pessary projecting over the lower  pelvis incidentally noted. IMPRESSION: 1. Nasogastric tube appears coiled in the patient's large hiatal hernia. 2. No radiographic evidence of acute cardiopulmonary disease. 3. Aortic atherosclerosis. 4. Nonobstructive bowel gas pattern. 5. No pneumoperitoneum. Electronically Signed   By: Trudie Reed M.D.   On: 09/15/2023 07:33   DG Abd Portable 1 View Result Date: 09/14/2023 CLINICAL DATA:  NG tube placement EXAM: PORTABLE ABDOMEN - 1 VIEW COMPARISON:  09/13/2023 CT FINDINGS: NG tube is in the stomach. Visualized lung bases clear. Large hiatal hernia noted. IMPRESSION: NG tube in the stomach. Electronically Signed   By: Charlett Nose M.D.   On: 09/14/2023 00:41   CT ABDOMEN PELVIS W CONTRAST Result Date: 09/13/2023 CLINICAL DATA:  Nausea and vomiting. Hiatal hernia with surgery scheduled for January. EXAM: CT ABDOMEN AND PELVIS WITH CONTRAST TECHNIQUE:  Multidetector CT imaging of the abdomen and pelvis was performed using the standard protocol following bolus administration of intravenous contrast. RADIATION DOSE REDUCTION: This exam was performed according to the departmental dose-optimization program which includes automated exposure control, adjustment of the mA and/or kV according to patient size and/or use of iterative reconstruction technique. CONTRAST:  OMNIPAQUE IOHEXOL 300 MG/ML  SOLN COMPARISON:  08/11/2023 FINDINGS: Lower chest: Motion artifact limits examination. Lung bases are clear. There is a large fluid distended esophageal hiatal hernia measuring 5.5 x 9.5 cm in diameter. Size is increased since prior study. Configuration is consistent with a para esophageal hiatal hernia. Fluid is demonstrated within the distal esophagus without significant distention. Mild esophageal wall thickening may indicate reflux disease. Hepatobiliary: No focal liver abnormality is seen. Status post cholecystectomy. No biliary dilatation. Pancreas: Unremarkable. No pancreatic ductal dilatation or  surrounding inflammatory changes. Spleen: Normal in size without focal abnormality. Adrenals/Urinary Tract: Adrenal glands are unremarkable. Kidneys are normal, without renal calculi, focal lesion, or hydronephrosis. Bladder is unremarkable. Stomach/Bowel: The intrathoracic stomach and proximal stomach are markedly distended and fluid-filled. The distal limb of the stomach as it exits from the hiatal hernia is diffusely decompressed. Changes are consistent with gastric obstruction. Gastric pylorus, duodenal, and small bowel are decompressed. Proximal jejunum demonstrate wall thickening with mild mesenteric infiltration. This may indicate enteritis. No pneumatosis is demonstrated. Mesenteric vessels appear patent. Stool-filled colon without abnormal distention or wall thickening. Appendix is normal. Vascular/Lymphatic: Aortic atherosclerosis. No enlarged abdominal or pelvic lymph nodes. Reproductive: Vaginal pessary is in place. Uterus and ovaries appear surgically absent. Other: No free air or free fluid in the abdomen. Abdominal wall musculature appears intact. Musculoskeletal: Degenerative changes in the spine. No acute bony abnormalities. IMPRESSION: 1. Large paraesophageal hiatal hernia with severe fluid distention of the proximal stomach and herniated portion of the stomach. Transition zone at the hernia with decompressed distal stomach. Changes likely indicate gastric obstruction caused by the hernia. 2. Mild proximal jejunal wall thickening possibly enteritis. 3. Aortic atherosclerosis. Electronically Signed   By: Burman Nieves M.D.   On: 09/13/2023 23:24    Disposition Plan & Communication  Patient status: Inpatient  Admitted From: Home Planned disposition location: Home Anticipated discharge date: 12/28 pending resolution of acute obstruction   Family Communication: none at bedside     Author: Leeroy Bock, DO [facility] 09/15/2023, 8:23 AM   Available by Epic secure chat  7AM-7PM. If 7PM-7AM, please contact night-coverage.  [facility] contact information found on [email or web address].

## 2023-09-15 NOTE — ED Notes (Signed)
Wall suction appears to be malfunctioning @ this time with no new output noted to canister and green [place] fluid remains throughout loosely coiled tubing near pt rt side unchanged pt reports pressure in mid abd

## 2023-09-15 NOTE — ED Notes (Signed)
No new output to wall suction canister noted green [place] fluid remains in tubing

## 2023-09-15 NOTE — ED Notes (Signed)
External ng tubing shortened , mechanical wall suction equipment replaced with another wall suction from room 32 NG remains attached to pt rt nare pt reports comfort of placement canister noted to have approx 250 ml [place] green liquid output @ this time NG function appears to be assisting pt with decompressing of gastric contents

## 2023-09-15 NOTE — ED Notes (Signed)
Pt attached to wall suction for sbo tubing appears to be loosely coiled near pt rt side with green [place] fluid throughout tubing

## 2023-09-15 NOTE — Plan of Care (Signed)

## 2023-09-15 NOTE — ED Notes (Signed)
Writer RN begins to remove and replace all ng external tubing to attempt to correct malfunctioning ED room 32 wall suction equipment tubing for NG appears to have been doubled in length prior to arrival to room 32 unknown reason

## 2023-09-15 NOTE — Progress Notes (Signed)
CC: GOO from hiatal H Subjective: Doing well, no abd pain + flatus Wbc normalized KUB pers reviewed NG within [facility] No free air  Objective: Vital signs in last 24 hours: Temp:  [98.3 F (36.8 C)-98.9 F (37.2 C)] 98.6 F (37 C) (12/27 1242) Pulse Rate:  [61-76] 73 (12/27 1242) Resp:  [15-18] 18 (12/27 1242) BP: (115-172)/(57-80) 172/75 (12/27 1242) SpO2:  [98 %-100 %] 98 % (12/27 1242) Weight:  [68 kg] 68 kg (12/26 1929)    Intake/Output from previous day: 12/26 0701 - 12/27 0700 In: 700 [I.V.:600; IV Piggyback:100] Out: -  Intake/Output this shift: Total I/O In: 100 [IV Piggyback:100] Out: -   Physical exam:  NAd alert Abd: soft, nt, non distended Ext: well perfused no edema  Lab Results: CBC  Recent Labs    09/14/23 0508 09/15/23 0602  WBC 13.8* 8.4  HGB 10.0* 10.3*  HCT 30.7* 32.7*  PLT 401* 350   BMET Recent Labs    09/14/23 0508 09/15/23 0602  NA 138 142  K 2.9* 3.1*  CL 100 107  CO2 28 25  GLUCOSE 136* 95  BUN 18 13  CREATININE 0.79 0.65  CALCIUM 8.4* 8.3*   PT/INR No results for input(s): "LABPROT", "INR" in the last 72 hours. ABG No results for input(s): "PHART", "HCO3" in the last 72 hours.  Invalid input(s): "PCO2", "PO2"  Studies/Results: DG ABD ACUTE 2+V W 1V CHEST Result Date: 09/15/2023 CLINICAL DATA:  82 year old female with history of gastric outlet obstruction. EXAM: DG ABDOMEN ACUTE WITH 1 VIEW CHEST COMPARISON:  06/12/2023. FINDINGS: Coiled upon itself in the lower middle mediastinum, presumably within the patient's large hiatal hernia. Lung volumes are normal. No consolidative airspace disease. No pleural effusions. No pneumothorax. No evidence of pulmonary edema. Heart size is normal. The patient is rotated to the right on today's exam, resulting in distortion of the mediastinal contours and reduced diagnostic sensitivity and specificity for mediastinal pathology. Atherosclerotic calcifications are noted in the thoracic aorta.  Gas and stool are seen scattered throughout the colon extending to the level of the distal rectum. No pathologic distension of small bowel is noted. No gross evidence of pneumoperitoneum. Pessary projecting over the lower pelvis incidentally noted. IMPRESSION: 1. Nasogastric tube appears coiled in the patient's large hiatal hernia. 2. No radiographic evidence of acute cardiopulmonary disease. 3. Aortic atherosclerosis. 4. Nonobstructive bowel gas pattern. 5. No pneumoperitoneum. Electronically Signed   By: Trudie Reed M.D.   On: 09/15/2023 07:33   DG Abd Portable 1 View Result Date: 09/14/2023 CLINICAL DATA:  NG tube placement EXAM: PORTABLE ABDOMEN - 1 VIEW COMPARISON:  09/13/2023 CT FINDINGS: NG tube is in the stomach. Visualized lung bases clear. Large hiatal hernia noted. IMPRESSION: NG tube in the stomach. Electronically Signed   By: Charlett Nose M.D.   On: 09/14/2023 00:41   CT ABDOMEN PELVIS W CONTRAST Result Date: 09/13/2023 CLINICAL DATA:  Nausea and vomiting. Hiatal hernia with surgery scheduled for January. EXAM: CT ABDOMEN AND PELVIS WITH CONTRAST TECHNIQUE: Multidetector CT imaging of the abdomen and pelvis was performed using the standard protocol following bolus administration of intravenous contrast. RADIATION DOSE REDUCTION: This exam was performed according to the departmental dose-optimization program which includes automated exposure control, adjustment of the mA and/or kV according to patient size and/or use of iterative reconstruction technique. CONTRAST:  OMNIPAQUE IOHEXOL 300 MG/ML  SOLN COMPARISON:  08/11/2023 FINDINGS: Lower chest: Motion artifact limits examination. Lung bases are clear. There is a large fluid distended  esophageal hiatal hernia measuring 5.5 x 9.5 cm in diameter. Size is increased since prior study. Configuration is consistent with a para esophageal hiatal hernia. Fluid is demonstrated within the distal esophagus without significant distention. Mild  esophageal wall thickening may indicate reflux disease. Hepatobiliary: No focal liver abnormality is seen. Status post cholecystectomy. No biliary dilatation. Pancreas: Unremarkable. No pancreatic ductal dilatation or surrounding inflammatory changes. Spleen: Normal in size without focal abnormality. Adrenals/Urinary Tract: Adrenal glands are unremarkable. Kidneys are normal, without renal calculi, focal lesion, or hydronephrosis. Bladder is unremarkable. Stomach/Bowel: The intrathoracic stomach and proximal stomach are markedly distended and fluid-filled. The distal limb of the stomach as it exits from the hiatal hernia is diffusely decompressed. Changes are consistent with gastric obstruction. Gastric pylorus, duodenal, and small bowel are decompressed. Proximal jejunum demonstrate wall thickening with mild mesenteric infiltration. This may indicate enteritis. No pneumatosis is demonstrated. Mesenteric vessels appear patent. Stool-filled colon without abnormal distention or wall thickening. Appendix is normal. Vascular/Lymphatic: Aortic atherosclerosis. No enlarged abdominal or pelvic lymph nodes. Reproductive: Vaginal pessary is in place. Uterus and ovaries appear surgically absent. Other: No free air or free fluid in the abdomen. Abdominal wall musculature appears intact. Musculoskeletal: Degenerative changes in the spine. No acute bony abnormalities. IMPRESSION: 1. Large paraesophageal hiatal hernia with severe fluid distention of the proximal stomach and herniated portion of the stomach. Transition zone at the hernia with decompressed distal stomach. Changes likely indicate gastric obstruction caused by the hernia. 2. Mild proximal jejunal wall thickening possibly enteritis. 3. Aortic atherosclerosis. Electronically Signed   By: Burman Nieves M.D.   On: 09/13/2023 23:24    Anti-infectives: Anti-infectives (From admission, onward)    None       Assessment/Plan:  Resolving GOO from [facility] doing very  well DC NG Start CLD May do FUlls tomorrow and if she tolerate it she might be DC on full liquids until she get her formal [facility] repair in 3 weeks No need for urgent intervention  Sterling Big, MD, FACS  09/15/2023

## 2023-09-16 DIAGNOSIS — K440 Diaphragmatic hernia with obstruction, without gangrene: Secondary | ICD-10-CM | POA: Diagnosis not present

## 2023-09-16 LAB — BASIC METABOLIC PANEL
Anion gap: 6 (ref 5–15)
BUN: 10 mg/dL (ref 8–23)
CO2: 26 mmol/L (ref 22–32)
Calcium: 8.4 mg/dL — ABNORMAL LOW (ref 8.9–10.3)
Chloride: 109 mmol/L (ref 98–111)
Creatinine, Ser: 0.68 mg/dL (ref 0.44–1.00)
GFR, Estimated: >60 mL/min (ref >60)
Glucose, Bld: 92 mg/dL (ref 70–99)
Potassium: 3.2 mmol/L — ABNORMAL LOW (ref 3.5–5.1)
Sodium: 141 mmol/L (ref 135–145)

## 2023-09-16 MED ORDER — POLYETHYLENE GLYCOL 3350 17 G PO PACK: 17.0000 g | PACK | Freq: Every day | ORAL | 0 refills | Status: AC

## 2023-09-16 MED ORDER — POTASSIUM CHLORIDE CRYS ER 20 MEQ PO TBCR
40.0000 meq | EXTENDED_RELEASE_TABLET | Freq: Two times a day (BID) | ORAL | Status: DC
  Administered 2023-09-16: 40 meq via ORAL
  Filled 2023-09-16: qty 2

## 2023-09-16 NOTE — TOC CM/SW Note (Signed)
Transition of Care [facility]) - Inpatient Brief Assessment   Patient Details  Name: Claire Kane MRN: [id number] Date of Birth: 03-Mar-1941  [facility]) CM/SW Contact:    Liliana Cline, LCSW Phone Number: 09/16/2023, 9:45 AM   Clinical Narrative: Please consult TOC if needs arise.   Transition of Care Asessment: Insurance and Status: Insurance coverage has been reviewed Patient has primary care physician: Yes     Prior/Current Home Services: No current home services Social Drivers of Health Review: SDOH reviewed no interventions necessary Readmission risk has been reviewed: Yes Transition of care needs: no transition of care needs at this time

## 2023-09-16 NOTE — Discharge Instructions (Signed)
Follow up with your surgeon as scheduled.  Return to the ED if you are having return of your inability to eat  Guidance for your diet until surgery:  Full Liquid Diet Reading food labels Check the food labels on nutrition shakes. Look for shakes with 8-10 grams of protein in each serving. Choose drinks, such as milks and juices, that say they're "fortified" or "enriched." This means they have vitamins and minerals added to them. Shopping Buy pre-made nutrition shakes to keep on hand. Buy different flavors of milks and shakes. Meal planning Choose flavors and foods that you like. To make sure you get enough energy and calories from food: Have three full liquid meals each day. Have a liquid snack between each meal. Drink 6-8 oz (177-237 mL) of a supplement shake with meals or as snacks. Add protein powder, powdered milk, milk, or yogurt to shakes. This can help you get enough protein in your diet. Drink at least one serving a day of citrus fruit juice or fruit juice that has vitamin C added to it. General guidelines Before starting the full liquid diet, ask your health care provider what foods you should avoid. These may include full-fat or high-fiber liquids. You may have any liquid or food that becomes a liquid at room temperature. This includes any foods that can be poured off a spoon at room temperature. Do not drink alcohol unless your provider says it's okay. This diet gives you most of the nutrients you need for energy. But you may not get enough vitamins, minerals, and fiber. Make sure to talk to your provider or an expert in healthy eating called a dietitian about: How many calories you need to eat each day. How much fluid you should have each day. Taking a vitamin or supplement. What foods should I eat?  Fruits Fruit juice. fruit pures Vegetables Vegetables pured in soup. Grains Thin, hot cereal, such as oatmeal. Soft-cooked pasta or rice pured in soup. Meats and other  proteins Broths, soft beans. Protein supplements that come as a powder. Dairy Milk and milk-based drinks, such as milk shakes and instant breakfast mixes. Smooth yogurt. Pured cottage cheese. Fats and oils butter. Cream. almond, avocado, grapeseed, sunflower, and sesame oils. Gravy. Beverages Water. Coffee and tea, with or without caffeine. Cocoa. Liquid supplements. Soft drinks. Sweets and desserts Custard. Pudding. Flavored gelatin. Smooth ice cream, without nuts or candy pieces. Sherbet. Frozen ice pops. [place] ice. Pudding pops. Seasonings and condiments Salt and pepper. Spices. Vinegar. Ketchup. Yellow mustard. Smooth sauces, such as [place], cheese sauce, or white sauce. Soy sauce. Syrup. Honey. Jelly, without fruit pieces. Other foods Cocoa powder. Cream soups. Strained soups. The items listed above may not be all the foods and drinks you can have. Talk to a dietitian to learn more.

## 2023-09-16 NOTE — Progress Notes (Incomplete)
PROGRESS NOTE  Claire Kane    DOB: [birth year], 82 y.o.  FAO:[id number]    Code Status: Full Code   DOA: 09/13/2023   LOS: 3   Brief hospital course  Claire Kane is a 82 y.o. female with medical history significant for HTN, Breast cancer s/p lumpectomy and radiation (right 2002, left 2022) on Aromasin, chemotherapy-induced peripheral neuropathy, large symptomatic paraesophageal hernia, with upcoming surgery 10/10/2023.  Admitted 12/26 with gastric outlet obstruction related to her known hiatal hernia.  ED course and data review: Mildly tachycardic to 106 on arrival and slightly elevated blood pressure of 177/81 but otherwise normal vitals Notable findings on workup include the following: Lipase 30 and normal LFTs except for slightly elevated total bilirubin of 1.7 Potassium 2.8 WBC normal at 10 and hemoglobin at baseline at 11.8 CT abdomen and pelvis showing severe fluid distention proximal stomach as follows: IMPRESSION: 1. Large paraesophageal hiatal hernia with severe fluid distention of the proximal stomach and herniated portion of the stomach. Transition zone at the hernia with decompressed distal stomach. Changes likely indicate gastric obstruction caused by the hernia. 2. Mild proximal jejunal wall thickening possibly enteritis. 3. Aortic atherosclerosis.    The ED provider spoke with surgeon Dr. Everlene Farrier, who advised to treat as bowel obstruction and recommended NG tube. She was treated with a 1.5 L bolus and Zofran  09/16/23 -improved nausea, abdominal pain  Assessment & Plan  Principal Problem:   Obstruction concurrent with and due to paraesophageal hernia Active Problems:   Hypokalemia   Ductal carcinoma in situ (DCIS) of left breast   HTN (hypertension)   Hiatal hernia with obstruction but no gangrene   Chemotherapy-induced neuropathy (HCC)  Obstruction concurrent with and due to paraesophageal hernia Has been on NG suction since admission. With improving symptoms  and KUB monitoring, general surgery recommended advancing to CLD and dc NG.  - monitor on CLD - strict I/O - avoid oipioids   Hypokalemia- improving. S/p IV repletion - repeat BMP am    Chemotherapy-induced neuropathy (HCC) Not currently on  gabapentin    HTN (hypertension) Controlled.   Ductal carcinoma in situ (DCIS) of left breast No acute issues suspected s/p lumpectomy and radiation (right 2002, left 2022) on Aromasin Holding Aromasin for now Has upcoming biopsy on 09/15/23 which will need to be rescheduled  Body mass index is 27 kg/m.  VTE ppx: enoxaparin (LOVENOX) injection 40 mg Start: 09/14/23 1000  Diet:     Diet   Diet clear liquid Fluid consistency: Thin   Consultants: General surgery   Subjective 09/16/23    Pt reports feeling improved. No current nausea or abdominal pain. Has not been passing gas or having BM yet.    Objective   Vitals:   09/15/23 1242 09/15/23 1420 09/15/23 2058 09/16/23 0346  BP: (!) 172/75 (!) 145/76 136/73 (!) 148/73  Pulse: 73 74 71 66  Resp: 18 18 17 17   Temp: 98.6 F (37 C) 98.1 F (36.7 C) 98.1 F (36.7 C)   TempSrc: Oral Oral    SpO2: 98% 98% 96% 96%  Weight:      Height:        Intake/Output Summary (Last 24 hours) at 09/16/2023 0754 Last data filed at 09/15/2023 1843 Gross per 24 hour  Intake 711.16 ml  Output --  Net 711.16 ml   Filed Weights   09/13/23 2119 09/14/23 1929  Weight: 68 kg 68 kg    Physical Exam:  General: awake, alert, NAD HEENT:  atraumatic, clear conjunctiva, anicteric sclera, MMM, hearing grossly normal Respiratory: normal respiratory effort. Cardiovascular: quick capillary refill, normal S1/S2, RRR, no JVD, murmurs Gastrointestinal: soft, NT, ND Nervous: A&O x3. no gross focal neurologic deficits, normal speech Extremities: moves all equally, no edema, normal tone Skin: dry, intact, normal temperature, normal color. No rashes, lesions or ulcers on exposed skin Psychiatry: normal  mood, congruent affect  Labs   I have personally reviewed the following labs and imaging studies CBC    Component Value Date/Time   WBC 8.4 09/15/2023 0602   RBC 3.68 (L) 09/15/2023 0602   HGB 10.3 (L) 09/15/2023 0602   HGB 11.8 07/26/2023 1354   HCT 32.7 (L) 09/15/2023 0602   HCT 37.8 07/26/2023 1354   PLT 350 09/15/2023 0602   PLT 447 07/26/2023 1354   MCV 88.9 09/15/2023 0602   MCV 89 07/26/2023 1354   MCH 28.0 09/15/2023 0602   MCHC 31.5 09/15/2023 0602   RDW 13.9 09/15/2023 0602   RDW 13.7 07/26/2023 1354   LYMPHSABS 1.7 07/26/2023 1354   MONOABS 0.9 03/22/2023 1239   EOSABS 0.0 07/26/2023 1354   BASOSABS 0.0 07/26/2023 1354      Latest Ref Rng & Units 09/15/2023    6:02 AM 09/14/2023    5:08 AM 09/13/2023    9:25 PM  BMP  Glucose 70 - 99 mg/dL 95  161  096   BUN 8 - 23 mg/dL 13  18  17    Creatinine 0.44 - 1.00 mg/dL 0.45  4.09  8.11   Sodium 135 - 145 mmol/L 142  138  139   Potassium 3.5 - 5.1 mmol/L 3.1  2.9  2.8   Chloride 98 - 111 mmol/L 107  100  98   CO2 22 - 32 mmol/L 25  28  26    Calcium 8.9 - 10.3 mg/dL 8.3  8.4  9.5     DG ABD ACUTE 2+V W 1V CHEST Result Date: 09/15/2023 CLINICAL DATA:  82 year old female with history of gastric outlet obstruction. EXAM: DG ABDOMEN ACUTE WITH 1 VIEW CHEST COMPARISON:  06/12/2023. FINDINGS: Coiled upon itself in the lower middle mediastinum, presumably within the patient's large hiatal hernia. Lung volumes are normal. No consolidative airspace disease. No pleural effusions. No pneumothorax. No evidence of pulmonary edema. Heart size is normal. The patient is rotated to the right on today's exam, resulting in distortion of the mediastinal contours and reduced diagnostic sensitivity and specificity for mediastinal pathology. Atherosclerotic calcifications are noted in the thoracic aorta. Gas and stool are seen scattered throughout the colon extending to the level of the distal rectum. No pathologic distension of small bowel is  noted. No gross evidence of pneumoperitoneum. Pessary projecting over the lower pelvis incidentally noted. IMPRESSION: 1. Nasogastric tube appears coiled in the patient's large hiatal hernia. 2. No radiographic evidence of acute cardiopulmonary disease. 3. Aortic atherosclerosis. 4. Nonobstructive bowel gas pattern. 5. No pneumoperitoneum. Electronically Signed   By: Trudie Reed M.D.   On: 09/15/2023 07:33    Disposition Plan & Communication  Patient status: Inpatient  Admitted From: Home Planned disposition location: Home Anticipated discharge date: 12/28 pending resolution of acute obstruction   Family Communication: none at bedside     Author: Leeroy Bock, DO [facility] 09/16/2023, 7:54 AM   Available by Epic secure chat 7AM-7PM. If 7PM-7AM, please contact night-coverage.  [facility] contact information found on [email or web address].

## 2023-09-16 NOTE — Progress Notes (Signed)
CC: GOO from hiatal H Subjective: Doing well, no abd pain Continues to pass flatus.  Has tolerated a clear liquid diet yesterday and was eating a full liquid diet this morning. Objective: Vital signs in last 24 hours: Temp:  [97.5 F (36.4 C)-98.6 F (37 C)] 97.5 F (36.4 C) (12/28 0814) Pulse Rate:  [66-80] 80 (12/28 0814) Resp:  [17-18] 18 (12/28 0814) BP: (136-172)/(66-76) 146/66 (12/28 0814) SpO2:  [96 %-98 %] 98 % (12/28 0814) Last BM Date : 09/15/23  Intake/Output from previous day: 12/27 0701 - 12/28 0700 In: 811.2 [I.V.:187.5; IV Piggyback:623.7] Out: -  Intake/Output this shift: No intake/output data recorded.  Physical exam:  NAd alert Abd: soft, nt, non distended Ext: well perfused no edema  Lab Results: CBC  Recent Labs    09/14/23 0508 09/15/23 0602  WBC 13.8* 8.4  HGB 10.0* 10.3*  HCT 30.7* 32.7*  PLT 401* 350   BMET Recent Labs    09/15/23 0602 09/16/23 0648  NA 142 141  K 3.1* 3.2*  CL 107 109  CO2 25 26  GLUCOSE 95 92  BUN 13 10  CREATININE 0.65 0.68  CALCIUM 8.3* 8.4*   PT/INR No results for input(s): "LABPROT", "INR" in the last 72 hours. ABG No results for input(s): "PHART", "HCO3" in the last 72 hours.  Invalid input(s): "PCO2", "PO2"  Studies/Results: DG ABD ACUTE 2+V W 1V CHEST Result Date: 09/15/2023 CLINICAL DATA:  82 year old female with history of gastric outlet obstruction. EXAM: DG ABDOMEN ACUTE WITH 1 VIEW CHEST COMPARISON:  06/12/2023. FINDINGS: Coiled upon itself in the lower middle mediastinum, presumably within the patient's large hiatal hernia. Lung volumes are normal. No consolidative airspace disease. No pleural effusions. No pneumothorax. No evidence of pulmonary edema. Heart size is normal. The patient is rotated to the right on today's exam, resulting in distortion of the mediastinal contours and reduced diagnostic sensitivity and specificity for mediastinal pathology. Atherosclerotic calcifications are noted in  the thoracic aorta. Gas and stool are seen scattered throughout the colon extending to the level of the distal rectum. No pathologic distension of small bowel is noted. No gross evidence of pneumoperitoneum. Pessary projecting over the lower pelvis incidentally noted. IMPRESSION: 1. Nasogastric tube appears coiled in the patient's large hiatal hernia. 2. No radiographic evidence of acute cardiopulmonary disease. 3. Aortic atherosclerosis. 4. Nonobstructive bowel gas pattern. 5. No pneumoperitoneum. Electronically Signed   By: Trudie Reed M.D.   On: 09/15/2023 07:33    Anti-infectives: Anti-infectives (From admission, onward)    None       Assessment/Plan:  Resolving GOO from [facility] doing very well Tolerating a full liquid diet.  She can be discharged with this diet.  She is scheduled for surgery with Dr. Everlene Farrier in 3 weeks.  09/16/2023

## 2023-09-16 NOTE — Progress Notes (Signed)
Discharge instructions reviewed with patient including followup visits and new medications/medication changes.  Understanding was verbalized and all questions were answered.  IV removed without complication; patient tolerated well.  Patient discharged home via wheelchair in stable condition escorted by nursing staff.

## 2023-09-16 NOTE — Discharge Summary (Signed)
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 Active Problems:   Hypokalemia   Ductal carcinoma in situ (DCIS) of left breast   HTN (hypertension)   Hiatal hernia with obstruction but no gangrene   Chemotherapy-induced neuropathy [facility])  Brief Hospital Course Summary: Pt ***  Discharge Condition: {DISCHARGE CONDITION:19696}, improved Recommended discharge diet: {Discharge IONG:[id number]}  Consultations: ***  Procedures/Studies: ***   Allergies as of 09/16/2023       Reactions   Zometa [zoledronic Acid]    Severe back pain        Medication List     STOP taking these medications    calcium citrate 950 (200 Ca) MG tablet Commonly known as: CALCITRATE - dosed in mg elemental calcium   docusate sodium 100 MG capsule Commonly known as: COLACE       TAKE these medications    amLODipine 5 MG tablet Commonly known as: NORVASC TAKE 1 TABLET EVERY DAY   atorvastatin 10 MG tablet Commonly known as: LIPITOR TAKE 1 TABLET EVERY DAY   clotrimazole-betamethasone cream Commonly known as: Lotrisone Apply 1 Application topically 2 (two) times daily.   exemestane 25 MG tablet Commonly known as: AROMASIN TAKE 1 TABLET EVERY DAY AFTER BREAKFAST   ezetimibe 10 MG tablet Commonly known as: ZETIA TAKE 1 TABLET EVERY DAY   Glucosamine 750 MG Tabs Take 750 mg by mouth daily.   multivitamin with minerals tablet Take 1 tablet by mouth daily.   omeprazole 40 MG capsule Commonly known as: PRILOSEC Take 1  capsule (40 mg total) by mouth daily.   polyethylene glycol 17 g packet Commonly known as: MiraLax Take 17 g by mouth daily.   REFRESH DRY EYE THERAPY OP Place 1 drop into both eyes 2 (two) times daily.   simethicone 80 MG chewable tablet Commonly known as: MYLICON Chew 80 mg by mouth every 6 (six) hours as needed for flatulence.   solifenacin 10 MG tablet Commonly known as: VESICARE Take 1 tablet (10 mg total) by mouth daily.   Soolantra 1 % Crea Generic drug: Ivermectin Apply 1 application  topically daily as needed (rosacea).   spironolactone 50 MG tablet Commonly known as: ALDACTONE Take 50 mg by mouth daily.   traMADol 50 MG tablet Commonly known as: ULTRAM Take 1 tablet (50 mg total) by mouth every 12 (twelve) hours as needed.        Follow-up Information     Simmons-Robinson, Makiera, MD. Schedule an appointment as soon as possible for a visit in 2 week(s).   Specialty: Family Medicine Contact information: [address] [place] [address] [phone]                 Subjective   Pt reports ***  All questions and concerns were addressed at time of discharge.  Objective  Blood pressure (!) 146/66, pulse 80, temperature (!) 97.5 F (36.4 C), resp. rate 18, height 5' 2.5" (1.588 m), weight 68 kg, SpO2 98%.   General: Pt is alert, awake, not in acute distress Cardiovascular: RRR, S1/S2 +, no rubs, no gallops Respiratory: CTA bilaterally,  no wheezing, no rhonchi Abdominal: Soft, NT, ND, bowel sounds + Extremities: no edema, no cyanosis  The results of significant diagnostics from this hospitalization (including imaging, microbiology, ancillary and laboratory) are listed below for reference.   Imaging studies: DG ABD ACUTE 2+V W 1V CHEST Result Date: 09/15/2023 CLINICAL DATA:  82 year old female with history of gastric outlet obstruction. EXAM: DG ABDOMEN ACUTE WITH 1 VIEW CHEST COMPARISON:  06/12/2023. FINDINGS: Coiled upon  itself in the lower middle mediastinum, presumably within the patient's large hiatal hernia. Lung volumes are normal. No consolidative airspace disease. No pleural effusions. No pneumothorax. No evidence of pulmonary edema. Heart size is normal. The patient is rotated to the right on today's exam, resulting in distortion of the mediastinal contours and reduced diagnostic sensitivity and specificity for mediastinal pathology. Atherosclerotic calcifications are noted in the thoracic aorta. Gas and stool are seen scattered throughout the colon extending to the level of the distal rectum. No pathologic distension of small bowel is noted. No gross evidence of pneumoperitoneum. Pessary projecting over the lower pelvis incidentally noted. IMPRESSION: 1. Nasogastric tube appears coiled in the patient's large hiatal hernia. 2. No radiographic evidence of acute cardiopulmonary disease. 3. Aortic atherosclerosis. 4. Nonobstructive bowel gas pattern. 5. No pneumoperitoneum. Electronically Signed   By: Trudie Reed M.D.   On: 09/15/2023 07:33   DG Abd Portable 1 View Result Date: 09/14/2023 CLINICAL DATA:  NG tube placement EXAM: PORTABLE ABDOMEN - 1 VIEW COMPARISON:  09/13/2023 CT FINDINGS: NG tube is in the stomach. Visualized lung bases clear. Large hiatal hernia noted. IMPRESSION: NG tube in the stomach. Electronically Signed   By: Charlett Nose M.D.   On: 09/14/2023 00:41   CT ABDOMEN PELVIS W CONTRAST Result Date: 09/13/2023 CLINICAL DATA:  Nausea and vomiting. Hiatal hernia with surgery scheduled for January. EXAM: CT ABDOMEN AND PELVIS WITH CONTRAST TECHNIQUE: Multidetector CT imaging of the abdomen and pelvis was performed using the standard protocol following bolus administration of intravenous contrast. RADIATION DOSE REDUCTION: This exam was performed according to the departmental dose-optimization program which includes automated exposure control, adjustment of the mA and/or kV according to patient size  and/or use of iterative reconstruction technique. CONTRAST:  OMNIPAQUE IOHEXOL 300 MG/ML  SOLN COMPARISON:  08/11/2023 FINDINGS: Lower chest: Motion artifact limits examination. Lung bases are clear. There is a large fluid distended esophageal hiatal hernia measuring 5.5 x 9.5 cm in diameter. Size is increased since prior study. Configuration is consistent with a para esophageal hiatal hernia. Fluid is demonstrated within the distal esophagus without significant distention. Mild esophageal wall thickening may indicate reflux disease. Hepatobiliary: No focal liver abnormality is seen. Status post cholecystectomy. No biliary dilatation. Pancreas: Unremarkable. No pancreatic ductal dilatation or surrounding inflammatory changes. Spleen: Normal in size without focal abnormality. Adrenals/Urinary Tract: Adrenal glands are unremarkable. Kidneys are normal, without renal calculi, focal lesion, or hydronephrosis. Bladder is unremarkable. Stomach/Bowel: The intrathoracic stomach and proximal stomach are markedly distended and fluid-filled. The distal limb of the stomach as it exits from the hiatal hernia is diffusely decompressed. Changes are consistent with gastric obstruction. Gastric pylorus, duodenal, and small bowel are decompressed. Proximal jejunum demonstrate wall thickening with mild mesenteric infiltration. This may indicate enteritis. No pneumatosis is demonstrated. Mesenteric vessels appear patent. Stool-filled colon without abnormal distention or wall thickening. Appendix is normal. Vascular/Lymphatic: Aortic atherosclerosis. No enlarged abdominal or pelvic lymph nodes. Reproductive: Vaginal pessary is in place. Uterus and ovaries appear surgically absent. Other: No free air or free fluid in  the abdomen. Abdominal wall musculature appears intact. Musculoskeletal: Degenerative changes in the spine. No acute bony abnormalities. IMPRESSION: 1. Large paraesophageal hiatal hernia with severe fluid distention of  the proximal stomach and herniated portion of the stomach. Transition zone at the hernia with decompressed distal stomach. Changes likely indicate gastric obstruction caused by the hernia. 2. Mild proximal jejunal wall thickening possibly enteritis. 3. Aortic atherosclerosis. Electronically Signed   By: Burman Nieves M.D.   On: 09/13/2023 23:24   MM 3D DIAGNOSTIC MAMMOGRAM UNILATERAL LEFT BREAST Result Date: 09/06/2023 CLINICAL DATA:  Screening recall for possible LEFT breast asymmetry. 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 She is also status post a benign LEFT breast stereotactic guided biopsy for calcifications in December 2023 (X clip), with pathology demonstrating benign fat necrosis and postradiation changes. EXAM: DIGITAL DIAGNOSTIC UNILATERAL LEFT MAMMOGRAM WITH TOMOSYNTHESIS AND CAD; ULTRASOUND LEFT BREAST LIMITED TECHNIQUE: Left digital diagnostic mammography and breast tomosynthesis was performed. The images were evaluated with computer-aided detection. ; Targeted ultrasound examination of the left breast was performed. COMPARISON:  Previous exam(s). ACR Breast Density Category c: The breasts are heterogeneously dense, which may obscure small masses. FINDINGS: Spot tomosynthesis views demonstrate a 9 mm focal asymmetry in the upper-outer left breast, immediately subjacent to the skin at the site of prior lumpectomy scar (spot CC image 13/44, spot MLO image 23/49). This corresponds with the area of screening mammogram concern and is not definitely stable compared to prior exams. On physical exam, there is a focal palpable firmness associated with the lumpectomy scar. Targeted left breast ultrasound was performed by the sonographer and the physician. At 1 o'clock 5 cm from the nipple, there is a superficial oval hypoechoic mass with indistinct margins that measures 9 x 3 x 8 mm. There is moderate surrounding vascularity. This corresponds with the focal asymmetry  seen on mammogram. Targeted left axillary ultrasound demonstrates normal fibroglandular tissue. No lymphadenopathy. IMPRESSION: Left breast 9 mm superficial mass in the 1 o'clock position, immediately subjacent to the patient's lumpectomy scar, is suspicious for recurrent malignancy. Recommend further assessment ultrasound-guided biopsy. RECOMMENDATION: Left breast ultrasound-guided biopsy (1 site). I have discussed the findings and recommendations with the patient. If applicable, a reminder letter will be sent to the patient regarding the next appointment. BI-RADS CATEGORY  4: Suspicious. Electronically Signed   By: Jacob Moores M.D.   On: 09/06/2023 12:16   [place] LIMITED ULTRASOUND INCLUDING AXILLA LEFT BREAST  Result Date: 09/06/2023 CLINICAL DATA:  Screening recall for possible LEFT breast asymmetry. 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 She is also status post a benign LEFT breast stereotactic guided biopsy for calcifications in December 2023 (X clip), with pathology demonstrating benign fat necrosis and postradiation changes. EXAM: DIGITAL DIAGNOSTIC UNILATERAL LEFT MAMMOGRAM WITH TOMOSYNTHESIS AND CAD; ULTRASOUND LEFT BREAST LIMITED TECHNIQUE: Left digital diagnostic mammography and breast tomosynthesis was performed. The images were evaluated with computer-aided detection. ; Targeted ultrasound examination of the left breast was performed. COMPARISON:  Previous exam(s). ACR Breast Density Category c: The breasts are heterogeneously dense, which may obscure small masses. FINDINGS: Spot tomosynthesis views demonstrate a 9 mm focal asymmetry in the upper-outer left breast, immediately subjacent to the skin at the site of prior lumpectomy scar (spot CC image 13/44, spot MLO image 23/49). This corresponds with the area of screening mammogram concern and is not definitely stable compared to prior exams. On physical exam, there is a focal palpable firmness associated with  the lumpectomy scar. Targeted left breast ultrasound was performed by the sonographer and the physician. At 1 o'clock 5 cm from the nipple, there is a superficial oval hypoechoic mass with indistinct margins that measures 9 x 3 x 8 mm. There is moderate surrounding vascularity. This corresponds with the focal asymmetry seen on mammogram. Targeted left axillary ultrasound demonstrates normal fibroglandular tissue. No lymphadenopathy. IMPRESSION: Left breast 9 mm superficial mass in the 1 o'clock position, immediately subjacent to the patient's lumpectomy scar, is suspicious for recurrent malignancy. Recommend further assessment ultrasound-guided biopsy. RECOMMENDATION: Left breast ultrasound-guided biopsy (1 site). I have discussed the findings and recommendations with the patient. If applicable, a reminder letter will be sent to the patient regarding the next appointment. BI-RADS CATEGORY  4: Suspicious. Electronically Signed   By: Jacob Moores M.D.   On: 09/06/2023 12:16   MM 3D SCREENING MAMMOGRAM BILATERAL BREAST Result Date: 08/31/2023 CLINICAL DATA:  Screening. EXAM: DIGITAL SCREENING BILATERAL MAMMOGRAM WITH TOMOSYNTHESIS AND CAD TECHNIQUE: Bilateral screening digital craniocaudal and mediolateral oblique mammograms were obtained. Bilateral screening digital breast tomosynthesis was performed. The images were evaluated with computer-aided detection. COMPARISON:  Previous exam(s). ACR Breast Density Category b: There are scattered areas of fibroglandular density. FINDINGS: In the left breast, a possible asymmetry warrants further evaluation. In the right breast, no findings suspicious for malignancy. IMPRESSION: Further evaluation is suggested for possible asymmetry in the left breast. RECOMMENDATION: Diagnostic mammogram and possibly ultrasound of the left breast. (Code:[id number]) The patient will be contacted regarding the findings, and additional imaging will be scheduled. BI-RADS CATEGORY  0:  Incomplete: Need additional imaging evaluation. Electronically Signed   By: Edwin Cap M.D.   On: 08/31/2023 09:36    Labs: Basic Metabolic Panel: Recent Labs  Lab 09/13/23 2125 09/14/23 0508 09/15/23 0602 09/16/23 0648  NA 139 138 142 141  K 2.8* 2.9* 3.1* 3.2*  CL 98 100 107 109  CO2 26 28 25 26   GLUCOSE 165* 136* 95 92  BUN 17 18 13 10   CREATININE 0.73 0.79 0.65 0.68  CALCIUM 9.5 8.4* 8.3* 8.4*  MG  --   --  2.3  --    CBC: Recent Labs  Lab 09/13/23 2125 09/14/23 0508 09/15/23 0602  WBC 10.0 13.8* 8.4  HGB 11.8* 10.0* 10.3*  HCT 35.1* 30.7* 32.7*  MCV 83.2 84.3 88.9  PLT 459* 401* 350   Microbiology: ***  Time coordinating discharge: Over 30 minutes  Leeroy Bock, MD  [facility] 09/16/2023, 1:15 PM

## 2023-09-21 ENCOUNTER — Ambulatory Visit
Admission: RE | Admit: 2023-09-21 | Discharge: 2023-09-21 | Disposition: A | Discharge status: Home / Self Care | Payer: Medicare PPO | Source: Ambulatory Visit | Attending: Family Medicine | Admitting: Family Medicine

## 2023-09-21 DIAGNOSIS — R928 Other abnormal and inconclusive findings on diagnostic imaging of breast: Secondary | ICD-10-CM | POA: Diagnosis not present

## 2023-09-21 DIAGNOSIS — N641 Fat necrosis of breast: Secondary | ICD-10-CM | POA: Diagnosis not present

## 2023-09-21 DIAGNOSIS — N6321 Unspecified lump in the left breast, upper outer quadrant: Secondary | ICD-10-CM | POA: Diagnosis not present

## 2023-09-21 HISTORY — PX: BREAST BIOPSY: SHX20

## 2023-09-21 MED ORDER — LIDOCAINE-EPINEPHRINE 2 %-1:100000 IJ SOLN
8.0000 mL | Freq: Once | INTRAMUSCULAR | Status: AC
  Administered 2023-09-21: 8 mL
  Filled 2023-09-21: qty 8.5

## 2023-09-21 MED ORDER — LIDOCAINE 1 % OPTIME INJ - NO CHARGE
2.0000 mL | Freq: Once | INTRAMUSCULAR | Status: AC
  Administered 2023-09-21: 2 mL
  Filled 2023-09-21: qty 2

## 2023-09-22 LAB — SURGICAL PATHOLOGY

## 2023-09-27 ENCOUNTER — Encounter: Payer: Self-pay | Admitting: Oncology

## 2023-09-27 ENCOUNTER — Other Ambulatory Visit: Payer: Self-pay

## 2023-09-27 ENCOUNTER — Inpatient Hospital Stay: Payer: Medicare PPO | Admitting: Oncology

## 2023-09-27 ENCOUNTER — Inpatient Hospital Stay: Payer: Medicare PPO | Attending: Oncology

## 2023-09-27 VITALS — BP 136/69 | HR 73 | Temp 97.9°F | Resp 18 | Wt 148.0 lb

## 2023-09-27 DIAGNOSIS — D509 Iron deficiency anemia, unspecified: Secondary | ICD-10-CM

## 2023-09-27 DIAGNOSIS — Z79811 Long term (current) use of aromatase inhibitors: Secondary | ICD-10-CM | POA: Diagnosis not present

## 2023-09-27 DIAGNOSIS — G629 Polyneuropathy, unspecified: Secondary | ICD-10-CM | POA: Insufficient documentation

## 2023-09-27 DIAGNOSIS — M8580 Other specified disorders of bone density and structure, unspecified site: Secondary | ICD-10-CM | POA: Diagnosis not present

## 2023-09-27 DIAGNOSIS — Z170 Estrogen receptor positive status [ER+]: Secondary | ICD-10-CM | POA: Diagnosis not present

## 2023-09-27 DIAGNOSIS — Z79899 Other long term (current) drug therapy: Secondary | ICD-10-CM | POA: Diagnosis not present

## 2023-09-27 DIAGNOSIS — D0512 Intraductal carcinoma in situ of left breast: Secondary | ICD-10-CM

## 2023-09-27 LAB — CBC WITH DIFFERENTIAL (CANCER CENTER ONLY)
Abs Immature Granulocytes: 0.02 10*3/uL (ref 0.00–0.07)
Basophils Absolute: 0.1 10*3/uL (ref 0.0–0.1)
Basophils Relative: 1 %
Eosinophils Absolute: 0.1 10*3/uL (ref 0.0–0.5)
Eosinophils Relative: 2 %
HCT: 31.5 % — ABNORMAL LOW (ref 36.0–46.0)
Hemoglobin: 10.3 g/dL — ABNORMAL LOW (ref 12.0–15.0)
Immature Granulocytes: 0 %
Lymphocytes Relative: 29 %
Lymphs Abs: 2 10*3/uL (ref 0.7–4.0)
MCH: 27.9 pg (ref 26.0–34.0)
MCHC: 32.7 g/dL (ref 30.0–36.0)
MCV: 85.4 fL (ref 80.0–100.0)
Monocytes Absolute: 0.7 10*3/uL (ref 0.1–1.0)
Monocytes Relative: 10 %
Neutro Abs: 4 10*3/uL (ref 1.7–7.7)
Neutrophils Relative %: 58 %
Platelet Count: 462 10*3/uL — ABNORMAL HIGH (ref 150–400)
RBC: 3.69 MIL/uL — ABNORMAL LOW (ref 3.87–5.11)
RDW: 13.7 % (ref 11.5–15.5)
WBC Count: 6.8 10*3/uL (ref 4.0–10.5)
nRBC: 0 % (ref 0.0–0.2)

## 2023-09-27 LAB — RETIC PANEL
Immature Retic Fract: 8.8 % (ref 2.3–15.9)
RBC.: 3.68 MIL/uL — ABNORMAL LOW (ref 3.87–5.11)
Retic Count, Absolute: 39 10*3/uL (ref 19.0–186.0)
Retic Ct Pct: 1.1 % (ref 0.4–3.1)
Reticulocyte Hemoglobin: 28.5 pg (ref >27.9)

## 2023-09-27 LAB — CMP (CANCER CENTER ONLY)
ALT: 19 U/L (ref 0–44)
AST: 21 U/L (ref 15–41)
Albumin: 4.2 g/dL (ref 3.5–5.0)
Alkaline Phosphatase: 48 U/L (ref 38–126)
Anion gap: 9 (ref 5–15)
BUN: 14 mg/dL (ref 8–23)
CO2: 24 mmol/L (ref 22–32)
Calcium: 9.2 mg/dL (ref 8.9–10.3)
Chloride: 102 mmol/L (ref 98–111)
Creatinine: 0.75 mg/dL (ref 0.44–1.00)
GFR, Estimated: >60 mL/min (ref >60)
Glucose, Bld: 101 mg/dL — ABNORMAL HIGH (ref 70–99)
Potassium: 4.4 mmol/L (ref 3.5–5.1)
Sodium: 135 mmol/L (ref 135–145)
Total Bilirubin: 1 mg/dL (ref 0.0–1.2)
Total Protein: 6.6 g/dL (ref 6.5–8.1)

## 2023-09-27 LAB — IRON AND TIBC
Iron: 24 ug/dL — ABNORMAL LOW (ref 28–170)
Saturation Ratios: 5 % — ABNORMAL LOW (ref 10.4–31.8)
TIBC: 472 ug/dL — ABNORMAL HIGH (ref 250–450)
UIBC: 448 ug/dL

## 2023-09-27 LAB — FERRITIN: Ferritin: 5 ng/mL — ABNORMAL LOW (ref 11–307)

## 2023-09-27 NOTE — Assessment & Plan Note (Addendum)
 Has been started on gabapentin.  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

## 2023-09-27 NOTE — Assessment & Plan Note (Addendum)
 DEXA 10/28/2020 10-year probability of fracture 12.5% Major Osteoporotic Fracture 2.8% Continue  vitamin D supplementation. Temperarily off calcium  due to recent obstruction due to hernia surgery and concern of calcium  induced constipation. She may resume calcium  once surgical wound is healed.  Hold off Prolia  today  03/09/23  DEXA showed osteopenia.

## 2023-09-27 NOTE — Progress Notes (Addendum)
 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 Temperarily off calcium  due to recent obstruction due to hernia surgery and concern of calcium  induced constipation. She may resume calcium  once surgical wound is healed.  Hold off Prolia  today  03/09/23  DEXA showed osteopenia.   Neuropathy Has been started on gabapentin .  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 Currently on adjuvant endocrine therapy-Aromasin . She tolerates with mild difficulties. Labs are reviewed with patient. Continue Aromasin  plan for total 5 years [till April 2027] Continue annual mammogram-patient plans to get images done through surgeons office.  Iron  deficiency anemia Lab Results  Component Value Date   HGB 10.3 (L) 09/27/2023   TIBC 472 (H) 09/27/2023   IRONPCTSAT 5 (L) 09/27/2023   FERRITIN 5 (L) 09/27/2023    Recommend IV Venofer  weekly x 4  Orders Placed This Encounter  Procedures   Ferritin    Standing Status:   Future    Number of Occurrences:   1    Expected Date:   09/27/2023    Expiration Date:   09/26/2024   Retic Panel    Standing Status:   Future    Number of Occurrences:   1    Expected Date:   09/27/2023    Expiration Date:   09/26/2024   Iron  and TIBC    Standing Status:   Future    Number of Occurrences:   1    Expected Date:   09/27/2023    Expiration Date:   09/26/2024   CBC with Differential (Cancer Center Only)    Standing Status:   Future    Expected  Date:   12/26/2023    Expiration Date:   09/26/2024   CMP (Cancer Center only)    Standing Status:   Future    Expected Date:   12/26/2023    Expiration Date:   09/26/2024   Follow up in 3 months All questions were answered. The patient knows to call the clinic with any problems, questions or concerns.  Zelphia Cap, MD, PhD [facility] Hematology Oncology 09/27/2023   Orders Placed This Encounter  Procedures   Ferritin    Standing Status:   Future    Number of Occurrences:   1    Expected Date:   09/27/2023    Expiration Date:   09/26/2024   Retic Panel    Standing Status:   Future    Number of Occurrences:   1    Expected Date:   09/27/2023    Expiration Date:   09/26/2024   Iron  and TIBC    Standing Status:   Future    Number of Occurrences:   1    Expected Date:   09/27/2023    Expiration Date:   09/26/2024   CBC with Differential (Cancer Center Only)    Standing Status:   Future    Expected Date:   12/26/2023    Expiration Date:   09/26/2024   CMP (Cancer Center only)    Standing Status:   Future    Expected  Date:   12/26/2023    Expiration Date:   09/26/2024   Follow-up in 6 months. All questions were answered. The patient knows to call the clinic with any problems, questions or concerns.  Zelphia Cap, MD, PhD [facility] Hematology Oncology 09/27/2023    HISTORY OF PRESENTING ILLNESS:  Claire Kane is a  83 y.o.  female with PMH listed below who was referred to me for evaluation of anemia.  Patient reports she was told that she had iron  deficiency 2 years ago and she took iron  supplementation for a while and was told to stop. Recently found to to have worsening of anemia. She had Upper and lower endoscopy in 2017 by Dr.Sankar # Upper endoscopy 12/07/2017 Normal examined duodenum. - Large hiatal hernia. - Erythematous mucosa in the antrum. Biopsied. - The examination was otherwise normal # Colonoscopy on 12/07/2017  Non-thrombosed external hemorrhoids found on perianal exam. - Diverticulosis  in the sigmoid colon and in the descending colon. - The examination was otherwise normal on direct and retroflexion views. - No specimens collected. #  capsule study which did not reveal any active bleeding source.  # Remote history of DCIS right breast in  2002, s/p surgery and RT. Finished 5 years of tamoxifen.   # April - May s/p upper and lower endoscopy, capsule study which did not reveal active bleeding source  Celiac panel positive.  # iron  deficiency anemia.  Previously she has received IV Venofer . Patient has previously underwent extensive gastroenterology work-up including EGD, colonoscopy, capsule study which did not reveal active bleeding source.  #08/18/2020, screening mammogram showed left breast calcification. 09/07/2020, diagnostic left mammogram showed an area of 1.4 cm group of calcifications in the upper outer left breast.  Patient underwent breast biopsy, pathology came back positive for high-grade DCIS, focal necrosis, associated with calcifications.  ER status deferred to existing specimen.  # #10/07/2020, status post left DCIS lumpectomy by Dr. Dessa Pathology showed residual high-grade DCIS with associated calcifications.  Extent of DCIS at least 12 mm, grade 3, central necrosis, anterior and inferior margins were unifocal in positive for DCIS pTis, ER positive.  11/09/2020 reexcision showed no residual DCIS or invasive carcinoma. 01/04/2021, adjuvant breast radiation  Patient reports family history of VTE daughter who was tested positive for mutation. Significant family history of cardiovascular disease, familiar hypercholesterolemia. Parents, sister, brother passed away from heart attack.  Patient declined genetic testing.  01/08/2021, started on Arimidex .  09/01/2021, switched to Aromasin  due to arthralgia.    08/23/22 bilateral diagnostic mammogram - Now identified near the LEFT lumpectomy site are small round calcifications in a somewhat linear orientation  spanning a distance of 1 cm. Tissue sampling is recommended. 09/07/22 left breast stereotactic core needle biopsy-benign mammary parenchyma with fibrocystic and fibroadenomatoid changes and focal usual  ductal hyperplasia. No definite evidence of residual atypical proliferative breast disease.    Interval History 83 year old female with pertinent hematology history listed above reviewed by me today presents to reestablish care for left breast DCIS.   Patient takes Aromasin  25mg  daily.  Overall she tolerates well. + arthralgia, especially right hip. + recent admission due to obstruction associated with hernia. She has upcoming hernia repair surgery scheduled. On liquid diet.  She was advised to hold off calcium  supplementation.       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/2023   Cataract    Ductal carcinoma in situ (DCIS) of right breast    GERD (gastroesophageal reflux disease)    Heart murmur 2001   Hiatal hernia    High cholesterol    Hypertension    Malignant tumor of breast (HCC) 09/19/2020   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   Personal history of radiation therapy 2002, 2022   BREAST CA   Rosacea    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 complications     SURGICAL HISTORY: Past Surgical History:  Procedure Laterality Date   ABDOMINAL HYSTERECTOMY     BLEPHAROPLASTY     BREAST BIOPSY Left 09/15/2020   affirm bx, x marker, DCIS   BREAST BIOPSY Left 09/07/2022   Left Breast Stereo Bx X clip- path pending   BREAST BIOPSY Left 09/07/2022   MM LT BREAST BX W LOC DEV 1ST LESION IMAGE BX SPEC STEREO GUIDE 09/07/2022 [facility]   BREAST BIOPSY Left 09/21/2023   u/s bx ribbon clip, path pending   BREAST BIOPSY Left  09/21/2023   US  LT BREAST BX W LOC DEV 1ST LESION IMG BX SPEC US  GUIDE 09/21/2023 [facility]   BREAST CYST ASPIRATION Left 2003   BREAST EXCISIONAL BIOPSY Right 2002   POS   BREAST LUMPECTOMY Right 2002   w/ radiation   BREAST LUMPECTOMY Left 10/07/2020   RESIDUAL HIGH-GRADE DUCTAL CARCINOMA IN SITU (DCIS) DCIS is focally present 1.4 mm to the superior margin.   BREAST LUMPECTOMY Left    re-excision   BREAST LUMPECTOMY WITH NEEDLE LOCALIZATION Left 10/07/2020   Procedure: BREAST LUMPECTOMY WITH NEEDLE LOCALIZATION;  Surgeon: Dessa Reyes ORN, MD;  Location: [facility];  Service: General;  Laterality: Left;   BREAST SURGERY Right 2002   lumpectomy   CATARACT EXTRACTION W/ INTRAOCULAR LENS  IMPLANT, BILATERAL     CHOLECYSTECTOMY     COLONOSCOPY  2013   COLONOSCOPY WITH PROPOFOL  N/A 12/08/2015   Procedure: COLONOSCOPY WITH PROPOFOL ;  Surgeon: Louanne KANDICE Muse, MD;  Location: [facility];  Service: Endoscopy;  Laterality: N/A;   COLONOSCOPY WITH PROPOFOL  N/A 12/06/2017   Procedure: COLONOSCOPY WITH PROPOFOL ;  Surgeon: Therisa Bi, MD;  Location[facility];  Service: Gastroenterology;  Laterality: N/A;   COLONOSCOPY WITH PROPOFOL  N/A 12/22/2017   Procedure: COLONOSCOPY WITH PROPOFOL ;  Surgeon: Therisa Bi, MD;  Location: [facility]COPY;  Service: Gastroenterology;  Laterality: N/A;   ESOPHAGOGASTRODUODENOSCOPY (EGD) WITH PROPOFOL  N/A 12/08/2015   Procedure: ESOPHAGOGASTRODUODENOSCOPY (EGD) WITH PROPOFOL ;  Surgeon: Louanne KANDICE Muse, MD;  Location: [facility] ENDOSCOPY;  Service: Endoscopy;  Laterality: N/A;   ESOPHAGOGASTRODUODENOSCOPY (EGD) WITH PROPOFOL  N/A 12/06/2017   Procedure: ESOPHAGOGASTRODUODENOSCOPY (EGD) WITH PROPOFOL ;  Surgeon: Therisa Bi, MD;  Location: [facility] ENDOSCOPY;  Service: Gastroenterology;  Laterality: N/A;   ESOPHAGOGASTRODUODENOSCOPY (EGD) WITH PROPOFOL  N/A 12/22/2017   Procedure: ESOPHAGOGASTRODUODENOSCOPY (EGD) WITH PROPOFOL ;  Surgeon: Therisa Bi, MD;   Location: [facility] ENDOSCOPY;  Service: Gastroenterology;  Laterality: N/A;   ESOPHAGOGASTRODUODENOSCOPY (EGD) WITH PROPOFOL  N/A 08/30/2023   Procedure: ESOPHAGOGASTRODUODENOSCOPY (EGD) WITH PROPOFOL ;  Surgeon: Therisa Bi, MD;  Location: [facility] ENDOSCOPY;  Service: Gastroenterology;  Laterality: N/A;   EYE SURGERY Right 2014   EYE SURGERY Left 2014   GIVENS CAPSULE STUDY N/A 01/23/2018   Procedure: GIVENS CAPSULE STUDY;  Surgeon: Therisa Bi, MD;  Location: [facility] ENDOSCOPY;  Service: Gastroenterology;  Laterality: N/A;   iron  infusion  had 5 iron  infusions for anemia   post radiation therapy Right    right breast cancer 2002   RE-EXCISION OF BREAST CANCER,SUPERIOR MARGINS Left 11/09/2020   Procedure: RE-EXCISION OF BREAST CANCER,SUPERIOR MARGINS;  Surgeon: Dessa Reyes ORN, MD;  Location: [facility];  Service: General;  Laterality: Left;   Stab pheblectomy   2010   vein closure procedure Bilateral 2008    SOCIAL HISTORY: Social History   Socioeconomic History   Marital status: Married    Spouse name: Ubaldo   Number of children: 2   Years of education: Not on file   Highest education level: Bachelor's degree (e.g., BA, AB, BS)  Occupational History   Occupation: runner, broadcasting/film/video    Comment: retired  Tobacco Use   Smoking status: Never    Passive exposure: Never   Smokeless tobacco: Never  Vaping Use   Vaping status: Never Used  Substance and Sexual Activity   Alcohol use: No   Drug use: No   Sexual activity: Never  Other Topics Concern   Not on file  Social History Narrative   Patient lives with husband. She feels safe in her home.   Social Drivers of Corporate Investment Banker Strain: Low Risk  (02/01/2023)   Overall Financial Resource Strain (CARDIA)    Difficulty of Paying Living Expenses: Not hard at all  Food Insecurity: No Food Insecurity (09/15/2023)   Hunger Vital Sign    Worried About Running Out of Food in the Last Year: Never true    Ran Out of Food in the Last Year:  Never true  Transportation Needs: No Transportation Needs (09/15/2023)   PRAPARE - Administrator, Civil Service (Medical): No    Lack of Transportation (Non-Medical): No  Physical Activity: Inactive (02/01/2023)   Exercise Vital Sign    Days of Exercise per Week: 0 days    Minutes of Exercise per Session: 0 min  Stress: No Stress Concern Present (02/01/2023)   [employer] of Occupational Health - Occupational Stress Questionnaire    Feeling of Stress : Not at all  Social Connections: Moderately Isolated (02/01/2023)   Social Connection and Isolation Panel [NHANES]    Frequency of Communication with Friends and Family: More than three times a week    Frequency of Social Gatherings with Friends and Family: Once a week    Attends Religious Services: Never    Database Administrator or Organizations: No    Attends Banker Meetings: Never    Marital Status: Married  Catering Manager Violence: Not At Risk (09/15/2023)   Humiliation, Afraid, Rape, and Kick questionnaire    Fear of Current or Ex-Partner: No    Emotionally Abused: No    Physically Abused: No    Sexually Abused: No    FAMILY HISTORY: Family History  Problem Relation Age of Onset   Heart attack Mother    Heart attack Father    Heart attack Sister    Stroke Sister    Heart attack Brother    Other Brother        cerebral hemorrhage, sepsis   Breast cancer Cousin 50       breast/maternal   Cancer Cousin 60       breast/maternal   Breast cancer Cousin    Breast cancer Maternal Aunt 91   Breast cancer Other 48       maternal neice    ALLERGIES:  is allergic to zometa  [zoledronic  acid].  MEDICATIONS:  Current Outpatient Medications  Medication Sig Dispense Refill   amLODipine  (NORVASC ) 5 MG tablet TAKE 1 TABLET EVERY DAY 90 tablet 3   atorvastatin  (LIPITOR) 10 MG tablet TAKE 1 TABLET EVERY DAY 90 tablet 3   clotrimazole -betamethasone  (LOTRISONE ) cream Apply 1 Application topically 2  (two) times daily. 30 g 6   exemestane  (AROMASIN ) 25 MG tablet TAKE 1 TABLET EVERY DAY AFTER BREAKFAST 90 tablet 3   ezetimibe  (ZETIA ) 10 MG tablet TAKE 1 TABLET EVERY DAY 90 tablet 3   Glucosamine 750 MG TABS Take 750 mg by mouth daily.     Glycerin -Polysorbate 80 (REFRESH DRY EYE THERAPY OP) Place 1 drop into both eyes 2 (two) times daily.     Multiple Vitamins-Minerals (MULTIVITAMIN WITH MINERALS) tablet Take 1 tablet by mouth daily.     omeprazole  (PRILOSEC) 40 MG capsule Take 1 capsule (40 mg total) by mouth daily. 90 capsule 3   polyethylene glycol (MIRALAX ) 17 g packet Take 17 g by mouth daily. 30 each 0   simethicone (MYLICON) 80 MG chewable tablet Chew 80 mg by mouth every 6 (six) hours as needed for flatulence.     solifenacin  (VESICARE ) 10 MG tablet Take 1 tablet (10 mg total) by mouth daily. 90 tablet 3   SOOLANTRA 1 % CREA Apply 1 application  topically daily as needed (rosacea).     spironolactone  (ALDACTONE ) 50 MG tablet Take 50 mg by mouth daily.     traMADol  (ULTRAM ) 50 MG tablet Take 1 tablet (50 mg total) by mouth every 12 (twelve) hours as needed. 60 tablet 0   No current facility-administered medications for this visit.   Facility-Administered Medications Ordered in Other Visits  Medication Dose Route Frequency Provider Last Rate Last Admin   0.9 %  sodium chloride  infusion   Intravenous Once Babara Call, MD       Review of Systems  Constitutional:  Negative for appetite change, chills, fatigue and fever.  HENT:   Negative for hearing loss and voice change.   Eyes:  Negative for eye problems.  Respiratory:  Negative for chest tightness and cough.   Cardiovascular:  Negative for chest pain.  Gastrointestinal:  Positive for constipation. Negative for abdominal pain and blood in stool.  Endocrine: Negative for hot flashes.  Genitourinary:  Negative for difficulty urinating and frequency.   Musculoskeletal:  Negative for arthralgias.  Skin:  Negative for itching and rash.   Neurological:  Negative for extremity weakness.  Hematological:  Negative for adenopathy.  Psychiatric/Behavioral:  Negative for confusion.      PHYSICAL EXAMINATION: ECOG PERFORMANCE STATUS: 1 - Symptomatic but completely ambulatory Vitals:   09/27/23 1314  BP: 136/69  Pulse: 73  Resp: 18  Temp: 97.9 F (36.6 C)  SpO2: 98%   Filed Weights   09/27/23 1314  Weight: 148 lb (67.1 kg)    Physical Exam Constitutional:      General: She is not in acute distress.    Appearance: She is not diaphoretic.     Comments: Obese  HENT:     Head: Normocephalic and atraumatic.  Eyes:     General: No scleral icterus. Cardiovascular:     Rate and Rhythm: Normal rate.  Pulmonary:     Effort: Pulmonary effort is normal. No respiratory distress.     Breath sounds: Normal breath sounds.  Abdominal:     General: Bowel sounds are normal. There is no distension.     Palpations: Abdomen is soft.  Musculoskeletal:  General: Normal range of motion.     Cervical back: Normal range of motion and neck supple.  Lymphadenopathy:     Cervical: No cervical adenopathy.  Skin:    General: Skin is warm and dry.  Neurological:     Mental Status: She is alert and oriented to person, place, and time. Mental status is at baseline.     Cranial Nerves: No cranial nerve deficit.     Motor: No abnormal muscle tone.  Psychiatric:        Mood and Affect: Affect normal.        Cognition and Memory: Memory normal.        Judgment: Judgment normal.      LABORATORY DATA:  I have reviewed the data as listed    Latest Ref Rng & Units 09/27/2023   12:43 PM 09/15/2023    6:02 AM 09/14/2023    5:08 AM  CBC  WBC 4.0 - 10.5 K/uL 6.8  8.4  13.8   Hemoglobin 12.0 - 15.0 g/dL 89.6  89.6  89.9   Hematocrit 36.0 - 46.0 % 31.5  32.7  30.7   Platelets 150 - 400 K/uL 462  350  401       Latest Ref Rng & Units 09/27/2023   12:43 PM 09/16/2023    6:48 AM 09/15/2023    6:02 AM  CMP  Glucose 70 - 99 mg/dL  898  92  95   BUN 8 - 23 mg/dL 14  10  13    Creatinine 0.44 - 1.00 mg/dL 9.24  9.31  9.34   Sodium 135 - 145 mmol/L 135  141  142   Potassium 3.5 - 5.1 mmol/L 4.4  3.2  3.1   Chloride 98 - 111 mmol/L 102  109  107   CO2 22 - 32 mmol/L 24  26  25    Calcium  8.9 - 10.3 mg/dL 9.2  8.4  8.3   Total Protein 6.5 - 8.1 g/dL 6.6     Total Bilirubin 0.0 - 1.2 mg/dL 1.0     Alkaline Phos 38 - 126 U/L 48     AST 15 - 41 U/L 21     ALT 0 - 44 U/L 19

## 2023-09-27 NOTE — Assessment & Plan Note (Addendum)
#  Left breast DCIS high grade. pTis, ER positive 2022 Status post left lumpectomy-margin positive status post reexcision achieve negative margin followed by adjuvant breast radiation. Currently on adjuvant endocrine therapy-Aromasin . She tolerates with mild difficulties. Labs are reviewed with patient. Continue Aromasin  plan for total 5 years [till April 2027] Continue annual mammogram-patient plans to get images done through surgeons office.

## 2023-09-28 ENCOUNTER — Ambulatory Visit: Payer: Medicare PPO | Admitting: Family Medicine

## 2023-10-02 ENCOUNTER — Encounter
Admission: RE | Admit: 2023-10-02 | Discharge: 2023-10-02 | Disposition: A | Discharge status: Home / Self Care | Payer: Medicare PPO | Source: Ambulatory Visit | Attending: Surgery | Admitting: Surgery

## 2023-10-02 ENCOUNTER — Telehealth: Payer: Self-pay | Admitting: Gastroenterology

## 2023-10-02 ENCOUNTER — Other Ambulatory Visit: Payer: Self-pay

## 2023-10-02 ENCOUNTER — Ambulatory Visit: Payer: Medicare PPO | Admitting: Gastroenterology

## 2023-10-02 VITALS — Ht 62.5 in | Wt 145.0 lb

## 2023-10-02 DIAGNOSIS — I10 Essential (primary) hypertension: Secondary | ICD-10-CM

## 2023-10-02 DIAGNOSIS — E785 Hyperlipidemia, unspecified: Secondary | ICD-10-CM

## 2023-10-02 DIAGNOSIS — E7849 Other hyperlipidemia: Secondary | ICD-10-CM

## 2023-10-02 HISTORY — DX: Failed or difficult intubation, initial encounter: T88.4XXA

## 2023-10-02 HISTORY — DX: Intraductal carcinoma in situ of right breast: D05.11

## 2023-10-02 NOTE — Progress Notes (Deleted)
 Ruel Kung MD, MRCP(U.K) [address]  [address]  [place], [place] [address]  Main: [phone]  Fax: [phone]   Primary Care Physician: Sharma Coyer, MD  Primary Gastroenterologist:  Dr. Ruel Kung   No chief complaint on file.   HPI: Claire Kane is a 83 y.o. female Summary of history :   Initially referred and seen for GERD on 03/08/2022 ,slight increase in symptoms recently she is on Protonix  40 mg once a day.  She had been having some bloating gaseous distention she consumes soda daily.  No difficulty swallowing no change in bowel habits.. I had seen her back in  June 2019 for iron  deficiency anemia.  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 Babara for IV iron .       Interval history  05/16/2022-10/02/2023   Seen by Ellouise Console on 07/2023 for nausea and vomiting due to hiatal hernia - restarted on omeprazole  40 mg BID   08/30/2023: EGD for hematemesis   10/10/2023 :    Doing much better since last visit she cut down all her artificial sugars and sweeteners on Prilosec 40 g twice a day.  She is consuming a lot of xylitol for her dental issues which she has stopped feels much better.  On a low FODMAP diet.  Has not introduce foods back again finding that low FODMAP diet restrictive     Current Outpatient Medications  Medication Sig Dispense Refill   amLODipine  (NORVASC ) 5 MG tablet TAKE 1 TABLET EVERY DAY 90 tablet 3   atorvastatin  (LIPITOR) 10 MG tablet TAKE 1 TABLET EVERY DAY 90 tablet 3   clotrimazole -betamethasone  (LOTRISONE ) cream Apply 1 Application topically 2 (two) times daily. 30 g 6   exemestane  (AROMASIN ) 25 MG tablet TAKE 1 TABLET EVERY DAY AFTER BREAKFAST 90 tablet 3   ezetimibe  (ZETIA ) 10 MG tablet TAKE 1 TABLET EVERY DAY 90 tablet 3   Glucosamine 750 MG TABS Take 750 mg by mouth daily.     Glycerin -Polysorbate 80 (REFRESH DRY EYE THERAPY OP) Place 1 drop  into both eyes 2 (two) times daily.     Multiple Vitamins-Minerals (MULTIVITAMIN WITH MINERALS) tablet Take 1 tablet by mouth daily.     omeprazole  (PRILOSEC) 40 MG capsule Take 1 capsule (40 mg total) by mouth daily. 90 capsule 3   polyethylene glycol (MIRALAX ) 17 g packet Take 17 g by mouth daily. 30 each 0   simethicone (MYLICON) 80 MG chewable tablet Chew 80 mg by mouth every 6 (six) hours as needed for flatulence.     solifenacin  (VESICARE ) 10 MG tablet Take 1 tablet (10 mg total) by mouth daily. 90 tablet 3   SOOLANTRA 1 % CREA Apply 1 application  topically daily as needed (rosacea).     spironolactone  (ALDACTONE ) 50 MG tablet Take 50 mg by mouth daily.     traMADol  (ULTRAM ) 50 MG tablet Take 1 tablet (50 mg total) by mouth every 12 (twelve) hours as needed. 60 tablet 0   No current facility-administered medications for this visit.   Facility-Administered Medications Ordered in Other Visits  Medication Dose Route Frequency Provider Last Rate Last Admin   0.9 %  sodium chloride  infusion   Intravenous Once Babara Call, MD        Allergies as of 10/02/2023 - Review Complete 09/27/2023  Allergen Reaction Noted   Zometa  [zoledronic  acid]  12/06/2021       Interval  history   ***/***/202*   ***/***/2024   ROS:  General: Negative for anorexia, weight loss, fever, chills, fatigue, weakness. ENT: Negative for hoarseness, difficulty swallowing , nasal congestion. CV: Negative for chest pain, angina, palpitations, dyspnea on exertion, peripheral edema.  Respiratory: Negative for dyspnea at rest, dyspnea on exertion, cough, sputum, wheezing.  GI: See history of present illness. GU:  Negative for dysuria, hematuria, urinary incontinence, urinary frequency, nocturnal urination.  Endo: Negative for unusual weight change.    Physical Examination:   There were no vitals taken for this visit.  General: Well-nourished, well-developed in no acute distress.  Eyes: No icterus. Conjunctivae  pink. Mouth: Oropharyngeal mucosa moist and pink , no lesions erythema or exudate. Lungs: Clear to auscultation bilaterally. Non-labored. Heart: Regular rate and rhythm, no murmurs rubs or gallops.  Abdomen: Bowel sounds are normal, nontender, nondistended, no hepatosplenomegaly or masses, no abdominal bruits or hernia , no rebound or guarding.   Extremities: No lower extremity edema. No clubbing or deformities. Neuro: Alert and oriented x 3.  Grossly intact. Skin: Warm and dry, no jaundice.   Psych: Alert and cooperative, normal mood and affect.   Imaging Studies: MM CLIP PLACEMENT LEFT Result Date: 09/21/2023 CLINICAL DATA:  Status post ultrasound-guided biopsy of an indeterminate mass in the LEFT breast 1 o'clock position EXAM: 3D DIAGNOSTIC LEFT MAMMOGRAM POST ULTRASOUND BIOPSY COMPARISON:  Previous exam(s). FINDINGS: 3D Mammographic images were obtained following ultrasound guided biopsy of a mass in the left breast 1 o'clock position 5 cm from the nipple. The ribbon shaped biopsy marking clip is in expected position at the site of biopsy. IMPRESSION: Appropriate positioning of the ribbon shaped biopsy marking clip at the site of biopsy in the left breast 1 o'clock position. Final Assessment: Post Procedure Mammograms for Marker Placement Electronically Signed   By: Dirk Arrant M.D.   On: 09/21/2023 09:02   US  LT BREAST BX W LOC DEV 1ST LESION IMG BX SPEC US  GUIDE Result Date: 09/21/2023 CLINICAL DATA:  83 year old female with indeterminate mass in the left breast 1 o'clock position, subjacent to the prior lumpectomy scar. EXAM: ULTRASOUND GUIDED LEFT BREAST CORE NEEDLE BIOPSY COMPARISON:  Previous exam(s). PROCEDURE: I met with the patient and we discussed the procedure of ultrasound-guided biopsy, including benefits and alternatives. We discussed the high likelihood of a successful procedure. We discussed the risks of the procedure, including infection, bleeding, tissue injury, clip  migration, and inadequate sampling. Informed written consent was given. The usual time-out protocol was performed immediately prior to the procedure. Lesion quadrant: Upper outer quadrant Using sterile technique and 1% Lidocaine  as local anesthetic, under direct ultrasound visualization, a 14 gauge spring-loaded device was used to perform biopsy of a mass in the left breast 1 o'clock position 5 cm from the nipple using a lateral approach. At the conclusion of the procedure, a ribbon shaped tissue marker clip was deployed into the biopsy cavity. Follow up 2 view mammogram was performed and dictated separately. IMPRESSION: Ultrasound guided biopsy of a mass in the left breast 1 o'clock position 5 cm from the nipple. No apparent complications. Electronically Signed   By: Dirk Arrant M.D.   On: 09/21/2023 08:56   DG ABD ACUTE 2+V W 1V CHEST Result Date: 09/15/2023 CLINICAL DATA:  83 year old female with history of gastric outlet obstruction. EXAM: DG ABDOMEN ACUTE WITH 1 VIEW CHEST COMPARISON:  06/12/2023. FINDINGS: Coiled upon itself in the lower middle mediastinum, presumably within the patient's large hiatal hernia. Lung volumes are normal.  No consolidative airspace disease. No pleural effusions. No pneumothorax. No evidence of pulmonary edema. Heart size is normal. The patient is rotated to the right on today's exam, resulting in distortion of the mediastinal contours and reduced diagnostic sensitivity and specificity for mediastinal pathology. Atherosclerotic calcifications are noted in the thoracic aorta. Gas and stool are seen scattered throughout the colon extending to the level of the distal rectum. No pathologic distension of small bowel is noted. No gross evidence of pneumoperitoneum. Pessary projecting over the lower pelvis incidentally noted. IMPRESSION: 1. Nasogastric tube appears coiled in the patient's large hiatal hernia. 2. No radiographic evidence of acute cardiopulmonary disease. 3. Aortic  atherosclerosis. 4. Nonobstructive bowel gas pattern. 5. No pneumoperitoneum. Electronically Signed   By: Toribio Aye M.D.   On: 09/15/2023 07:33   DG Abd Portable 1 View Result Date: 09/14/2023 CLINICAL DATA:  NG tube placement EXAM: PORTABLE ABDOMEN - 1 VIEW COMPARISON:  09/13/2023 CT FINDINGS: NG tube is in the stomach. Visualized lung bases clear. Large hiatal hernia noted. IMPRESSION: NG tube in the stomach. Electronically Signed   By: Franky Crease M.D.   On: 09/14/2023 00:41   CT ABDOMEN PELVIS W CONTRAST Result Date: 09/13/2023 CLINICAL DATA:  Nausea and vomiting. Hiatal hernia with surgery scheduled for January. EXAM: CT ABDOMEN AND PELVIS WITH CONTRAST TECHNIQUE: Multidetector CT imaging of the abdomen and pelvis was performed using the standard protocol following bolus administration of intravenous contrast. RADIATION DOSE REDUCTION: This exam was performed according to the departmental dose-optimization program which includes automated exposure control, adjustment of the mA and/or kV according to patient size and/or use of iterative reconstruction technique. CONTRAST:  OMNIPAQUE  IOHEXOL  300 MG/ML  SOLN COMPARISON:  08/11/2023 FINDINGS: Lower chest: Motion artifact limits examination. Lung bases are clear. There is a large fluid distended esophageal hiatal hernia measuring 5.5 x 9.5 cm in diameter. Size is increased since prior study. Configuration is consistent with a para esophageal hiatal hernia. Fluid is demonstrated within the distal esophagus without significant distention. Mild esophageal wall thickening may indicate reflux disease. Hepatobiliary: No focal liver abnormality is seen. Status post cholecystectomy. No biliary dilatation. Pancreas: Unremarkable. No pancreatic ductal dilatation or surrounding inflammatory changes. Spleen: Normal in size without focal abnormality. Adrenals/Urinary Tract: Adrenal glands are unremarkable. Kidneys are normal, without renal calculi, focal  lesion, or hydronephrosis. Bladder is unremarkable. Stomach/Bowel: The intrathoracic stomach and proximal stomach are markedly distended and fluid-filled. The distal limb of the stomach as it exits from the hiatal hernia is diffusely decompressed. Changes are consistent with gastric obstruction. Gastric pylorus, duodenal, and small bowel are decompressed. Proximal jejunum demonstrate wall thickening with mild mesenteric infiltration. This may indicate enteritis. No pneumatosis is demonstrated. Mesenteric vessels appear patent. Stool-filled colon without abnormal distention or wall thickening. Appendix is normal. Vascular/Lymphatic: Aortic atherosclerosis. No enlarged abdominal or pelvic lymph nodes. Reproductive: Vaginal pessary is in place. Uterus and ovaries appear surgically absent. Other: No free air or free fluid in the abdomen. Abdominal wall musculature appears intact. Musculoskeletal: Degenerative changes in the spine. No acute bony abnormalities. IMPRESSION: 1. Large paraesophageal hiatal hernia with severe fluid distention of the proximal stomach and herniated portion of the stomach. Transition zone at the hernia with decompressed distal stomach. Changes likely indicate gastric obstruction caused by the hernia. 2. Mild proximal jejunal wall thickening possibly enteritis. 3. Aortic atherosclerosis. Electronically Signed   By: Elsie Gravely M.D.   On: 09/13/2023 23:24   MM 3D DIAGNOSTIC MAMMOGRAM UNILATERAL LEFT BREAST Result Date: 09/06/2023  CLINICAL DATA:  Screening recall for possible LEFT breast asymmetry. 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 She is also status post a benign LEFT breast stereotactic guided biopsy for calcifications in December 2023 (X clip), with pathology demonstrating benign fat necrosis and postradiation changes. EXAM: DIGITAL DIAGNOSTIC UNILATERAL LEFT MAMMOGRAM WITH TOMOSYNTHESIS AND CAD; ULTRASOUND LEFT BREAST LIMITED TECHNIQUE: Left  digital diagnostic mammography and breast tomosynthesis was performed. The images were evaluated with computer-aided detection. ; Targeted ultrasound examination of the left breast was performed. COMPARISON:  Previous exam(s). ACR Breast Density Category c: The breasts are heterogeneously dense, which may obscure small masses. FINDINGS: Spot tomosynthesis views demonstrate a 9 mm focal asymmetry in the upper-outer left breast, immediately subjacent to the skin at the site of prior lumpectomy scar (spot CC image 13/44, spot MLO image 23/49). This corresponds with the area of screening mammogram concern and is not definitely stable compared to prior exams. On physical exam, there is a focal palpable firmness associated with the lumpectomy scar. Targeted left breast ultrasound was performed by the sonographer and the physician. At 1 o'clock 5 cm from the nipple, there is a superficial oval hypoechoic mass with indistinct margins that measures 9 x 3 x 8 mm. There is moderate surrounding vascularity. This corresponds with the focal asymmetry seen on mammogram. Targeted left axillary ultrasound demonstrates normal fibroglandular tissue. No lymphadenopathy. IMPRESSION: Left breast 9 mm superficial mass in the 1 o'clock position, immediately subjacent to the patient's lumpectomy scar, is suspicious for recurrent malignancy. Recommend further assessment ultrasound-guided biopsy. RECOMMENDATION: Left breast ultrasound-guided biopsy (1 site). I have discussed the findings and recommendations with the patient. If applicable, a reminder letter will be sent to the patient regarding the next appointment. BI-RADS CATEGORY  4: Suspicious. Electronically Signed   By: Dirk Arrant M.D.   On: 09/06/2023 12:16   US  LIMITED ULTRASOUND INCLUDING AXILLA LEFT BREAST  Result Date: 09/06/2023 CLINICAL DATA:  Screening recall for possible LEFT breast asymmetry. 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  She is also status post a benign LEFT breast stereotactic guided biopsy for calcifications in December 2023 (X clip), with pathology demonstrating benign fat necrosis and postradiation changes. EXAM: DIGITAL DIAGNOSTIC UNILATERAL LEFT MAMMOGRAM WITH TOMOSYNTHESIS AND CAD; ULTRASOUND LEFT BREAST LIMITED TECHNIQUE: Left digital diagnostic mammography and breast tomosynthesis was performed. The images were evaluated with computer-aided detection. ; Targeted ultrasound examination of the left breast was performed. COMPARISON:  Previous exam(s). ACR Breast Density Category c: The breasts are heterogeneously dense, which may obscure small masses. FINDINGS: Spot tomosynthesis views demonstrate a 9 mm focal asymmetry in the upper-outer left breast, immediately subjacent to the skin at the site of prior lumpectomy scar (spot CC image 13/44, spot MLO image 23/49). This corresponds with the area of screening mammogram concern and is not definitely stable compared to prior exams. On physical exam, there is a focal palpable firmness associated with the lumpectomy scar. Targeted left breast ultrasound was performed by the sonographer and the physician. At 1 o'clock 5 cm from the nipple, there is a superficial oval hypoechoic mass with indistinct margins that measures 9 x 3 x 8 mm. There is moderate surrounding vascularity. This corresponds with the focal asymmetry seen on mammogram. Targeted left axillary ultrasound demonstrates normal fibroglandular tissue. No lymphadenopathy. IMPRESSION: Left breast 9 mm superficial mass in the 1 o'clock position, immediately subjacent to the patient's lumpectomy scar, is suspicious for recurrent malignancy. Recommend further assessment  ultrasound-guided biopsy. RECOMMENDATION: Left breast ultrasound-guided biopsy (1 site). I have discussed the findings and recommendations with the patient. If applicable, a reminder letter will be sent to the patient regarding the next appointment. BI-RADS  CATEGORY  4: Suspicious. Electronically Signed   By: Dirk Arrant M.D.   On: 09/06/2023 12:16    Assessment and Plan:   Claire Kane is a 83 y.o. y/o female here  to follow up for GERD.  The EGD that I performed in 2019 demonstrated an 8 cm hiatal hernia which is very likely the underlying cause for the significant reflux which is compounded by the obesity.  Doing well from the reflux point of view.  She was having a lot of gas and bloating the last visit.  Attributed to artificial sugars and sweeteners in her diet.  She was consuming a significant amount of xylitol and at that since reducing quantity she has noted significant improvement in her symptoms she is on a low FODMAP diet at this point of time.   Plan 1.  She is doing well hence reduce the dose of PPI to Prilosec 40 mg once a day as her old dose of medication 2.  As needed use of charcoal 3.  Start reintroducing foods    Dr Ruel Kung  MD,MRCP [facility]) Follow up in ***  BP check ***

## 2023-10-02 NOTE — Telephone Encounter (Signed)
 The patient called in to cancel office visit due to the weather. We discuss her switching it to Mychart but she said that she don't know how to do that. She wanted to know if Dr. [name] needs to see her. Please called the patient.

## 2023-10-02 NOTE — Telephone Encounter (Signed)
 Called patient back to let her know that Dr. Tobi Bastos would like for her to have her surgery done first with Dr. Everlene Farrier and then if she needed to be seen by Dr. Tobi Bastos after the surgery, to give [place] a call back. Patient understood and had no further questions.

## 2023-10-02 NOTE — Patient Instructions (Addendum)
 Your procedure is scheduled on: Tuesday 10/10/23 To find out your arrival time, please call [phone] between 1PM - 3PM on:   Monday 10/09/23 Report to the Registration Desk on the 1st floor of the [facility]. FREE Valet parking is available.  If your arrival time is 6:00 am, do not arrive before that time as the [facility] entrance doors do not open until 6:00 am.  REMEMBER: Instructions that are not followed completely may result in serious medical risk, up to and including death; or upon the discretion of your surgeon and anesthesiologist your surgery may need to be rescheduled.  Do not eat food or drink any liquids after midnight the night before surgery.  No gum chewing or hard candies..  One week prior to surgery: Stop Anti-inflammatories (NSAIDS) such as Advil, Aleve , Ibuprofen, Motrin, Naproxen , Naprosyn  and Aspirin based products such as Excedrin, Goody's Powder, BC Powder. You may however, continue to take Tylenol  if needed for pain up until the day of surgery.  Stop ANY OVER THE COUNTER supplements until after surgery. Multivitamins and Glucosamine  Continue taking all prescribed medications as usual.   TAKE ONLY THESE MEDICATIONS THE MORNING OF SURGERY WITH A SIP OF WATER:  amLODipine  (NORVASC ) 5 MG tablet  atorvastatin  (LIPITOR) 10 MG tablet  ezetimibe  (ZETIA ) 10 MG tablet  omeprazole  (PRILOSEC) 40 MG capsule Antacid (take one the night before and one on the morning of surgery - helps to prevent nausea after surgery.) solifenacin  (VESICARE ) 10 MG tablet   No Alcohol for 24 hours before or after surgery.  No Smoking including e-cigarettes for 24 hours before surgery.  No chewable tobacco products for at least 6 hours before surgery.  No nicotine patches on the day of surgery.  Do not use any recreational drugs for at least a week (preferably 2 weeks) before your surgery.  Please be advised that the combination of cocaine and anesthesia may have negative  outcomes, up to and including death. If you test positive for cocaine, your surgery will be cancelled.  On the morning of surgery brush your teeth with toothpaste and water, you may rinse your mouth with mouthwash if you wish. Do not swallow any toothpaste or mouthwash.  Use CHG Soap or wipes as directed on instruction sheet.  Do not wear lotions, powders, or perfumes.   Do not shave body hair from the neck down 48 hours before surgery.  Wear clean comfortable clothing (specific to your surgery type) to the hospital.  Do not wear jewelry, make-up, hairpins, clips or nail polish.  For welded (permanent) jewelry: bracelets, anklets, waist bands, etc.  Please have this removed prior to surgery.  If it is not removed, there is a chance that hospital personnel will need to cut it off on the day of surgery. Contact lenses, hearing aids and dentures may not be worn into surgery.  Do not bring valuables to the hospital. [facility] is not responsible for any missing/lost belongings or valuables.   Notify your doctor if there is any change in your medical condition (cold, fever, infection).  If you are being discharged the day of surgery, you will not be allowed to drive home. You will need a responsible individual to drive you home and stay with you for 24 hours after surgery.   If you are taking public transportation, you will need to have a responsible individual with you.  If you are being admitted to the hospital overnight, leave your suitcase in the car. After surgery  it may be brought to your room.  In case of increased patient census, it may be necessary for you, the patient, to continue your postoperative care in the Same Day Surgery department.  After surgery, you can help prevent lung complications by doing breathing exercises.  Take deep breaths and cough every 1-2 hours. Your doctor may order a device called an Incentive Spirometer to help you take deep breaths. When coughing or  sneezing, hold a pillow firmly against your incision with both hands. This is called "splinting." Doing this helps protect your incision. It also decreases belly discomfort.  Surgery Visitation Policy:  Patients undergoing a surgery or procedure may have two family members or support persons with them as long as the person is not COVID-19 positive or experiencing its symptoms.   Inpatient Visitation:    Visiting hours are 7 a.m. to 8 p.m. Up to four visitors are allowed at one time in a patient room. The visitors may rotate out with other people during the day. One designated support person (adult) may remain overnight.  Please call the Pre-admissions Testing Dept. at [phone] if you have any questions about these instructions.     Preparing for Surgery with CHLORHEXIDINE  GLUCONATE (CHG) Soap  Chlorhexidine  Gluconate (CHG) Soap  o An antiseptic cleaner that kills germs and bonds with the skin to continue killing germs even after washing  o Used for showering the night before surgery and morning of surgery  Before surgery, you can play an important role by reducing the number of germs on your skin.  CHG (Chlorhexidine  gluconate) soap is an antiseptic cleanser which kills germs and bonds with the skin to continue killing germs even after washing.  Please do not use if you have an allergy to CHG or antibacterial soaps. If your skin becomes reddened/irritated stop using the CHG.  1. Shower the NIGHT BEFORE SURGERY and the MORNING OF SURGERY with CHG soap.  2. If you choose to wash your hair, wash your hair first as usual with your normal shampoo.  3. After shampooing, rinse your hair and body thoroughly to remove the shampoo.  4. Use CHG as you would any other liquid soap. You can apply CHG directly to the skin and wash gently with a scrungie or a clean washcloth.  5. Apply the CHG soap to your body only from the neck down. Do not use on open wounds or open sores. Avoid contact  with your eyes, ears, mouth, and genitals (private parts). Wash face and genitals (private parts) with your normal soap.  6. Wash thoroughly, paying special attention to the area where your surgery will be performed.  7. Thoroughly rinse your body with warm water.  8. Do not shower/wash with your normal soap after using and rinsing off the CHG soap.  9. Pat yourself dry with a clean towel.  10. Wear clean pajamas to bed the night before surgery.  12. Place clean sheets on your bed the night of your first shower and do not sleep with pets.  13. Shower again with the CHG soap on the day of surgery prior to arriving at the hospital.  14. Do not apply any deodorants/lotions/powders.  15. Please wear clean clothes to the hospital.

## 2023-10-03 ENCOUNTER — Other Ambulatory Visit: Payer: Self-pay | Admitting: Oncology

## 2023-10-03 DIAGNOSIS — D509 Iron deficiency anemia, unspecified: Secondary | ICD-10-CM

## 2023-10-03 NOTE — Assessment & Plan Note (Signed)
 Lab Results  Component Value Date   HGB 10.3 (L) 09/27/2023   TIBC 472 (H) 09/27/2023   IRONPCTSAT 5 (L) 09/27/2023   FERRITIN 5 (L) 09/27/2023    Recommend IV Venofer weekly x 4

## 2023-10-04 ENCOUNTER — Other Ambulatory Visit: Payer: Self-pay

## 2023-10-04 ENCOUNTER — Encounter
Admission: RE | Admit: 2023-10-04 | Discharge: 2023-10-04 | Disposition: A | Discharge status: Home / Self Care | Payer: Medicare PPO | Source: Ambulatory Visit | Attending: Surgery | Admitting: Surgery

## 2023-10-04 ENCOUNTER — Telehealth: Payer: Self-pay

## 2023-10-04 ENCOUNTER — Ambulatory Visit (INDEPENDENT_AMBULATORY_CARE_PROVIDER_SITE_OTHER): Payer: Medicare PPO | Admitting: Surgery

## 2023-10-04 ENCOUNTER — Encounter: Payer: Self-pay | Admitting: Surgery

## 2023-10-04 VITALS — BP 158/69 | HR 78 | Temp 97.9°F | Ht 62.5 in | Wt 143.2 lb

## 2023-10-04 DIAGNOSIS — Z01810 Encounter for preprocedural cardiovascular examination: Secondary | ICD-10-CM | POA: Diagnosis not present

## 2023-10-04 DIAGNOSIS — K449 Diaphragmatic hernia without obstruction or gangrene: Secondary | ICD-10-CM | POA: Diagnosis not present

## 2023-10-04 DIAGNOSIS — E7849 Other hyperlipidemia: Secondary | ICD-10-CM | POA: Diagnosis not present

## 2023-10-04 DIAGNOSIS — I10 Essential (primary) hypertension: Secondary | ICD-10-CM | POA: Diagnosis not present

## 2023-10-04 DIAGNOSIS — E785 Hyperlipidemia, unspecified: Secondary | ICD-10-CM

## 2023-10-04 DIAGNOSIS — K219 Gastro-esophageal reflux disease without esophagitis: Secondary | ICD-10-CM

## 2023-10-04 MED ORDER — VITRON-C 65-125 MG PO TABS: 1.0000 | ORAL_TABLET | Freq: Every day | ORAL | 0 refills | Status: DC

## 2023-10-04 NOTE — Telephone Encounter (Signed)
 Pt states she would like to proceed with IV venfoer, but is scheduled for hernia surgery next week and is not sure when she can come in. Advised her to start oral iron  after her surgery (she will check with Dr. Dana Duncan if it will be ok), then when she is released to drive she will call to schedule IV iron .

## 2023-10-04 NOTE — Telephone Encounter (Signed)
 Message left on phone and mychart message sent. Asked pt to let us  know if she would like to proceed with oral iron  or  IV venofer , if so she will need:   IV venofer  weekly x4.

## 2023-10-04 NOTE — Progress Notes (Signed)
 Surgical Consultation  10/04/2023  Claire Kane is an 83 y.o. female.   Chief Complaint  Patient presents with   Follow-up    Hiatal hernia 10/09/22     HPI:  Claire Kane is a 83 y.o. female seen in follow-up for  large paraesophageal hernia.    More recently episode of gastric outlet obstruction that required NG decompression over the holidays. Have feels better and is on a full liquid diet    She does have episodes of regurgitation.  She does have some dysphagia for certain meals such as bread.  She does have a history of iron deficiency anemia and had a EGD   I have personally reviewed the images showing evidence of a large hiatal hernia.  She does have some constipation.  SHe also reports early satiety. Her symptoms get worse when she lays down.  Does have some intermittent cough hemoglobin 11.5 Prior surgical history includes abdominal hysterectomy as well as ectomy of the breast for cancer SHe did have a recent CT and barium swallow that have personally reviewed showing evidence of a giant paraesophageal hernia with entire stomach within the mediastinum.  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   Personal history of radiation therapy 2002, 2022   BREAST CA   Rosacea    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 complications     Past Surgical History:  Procedure Laterality Date   ABDOMINAL  HYSTERECTOMY     BLEPHAROPLASTY     BREAST BIOPSY Left 09/15/2020   affirm bx, x marker, DCIS   BREAST BIOPSY Left 09/07/2022   Left Breast Stereo Bx X clip- path pending   BREAST BIOPSY Left 09/07/2022   MM LT BREAST BX W LOC DEV 1ST LESION IMAGE BX SPEC STEREO GUIDE 09/07/2022 [facility]   BREAST BIOPSY Left 09/21/2023   u/s bx ribbon clip, path pending   BREAST BIOPSY Left 09/21/2023   [place] LT BREAST BX W LOC DEV 1ST LESION IMG BX SPEC US GUIDE 09/21/2023 [facility]   BREAST CYST ASPIRATION Left 2003   BREAST EXCISIONAL BIOPSY Right 2002   POS   BREAST LUMPECTOMY Right 2002   w/ radiation   BREAST LUMPECTOMY Left 10/07/2020   RESIDUAL HIGH-GRADE DUCTAL CARCINOMA IN SITU (DCIS) DCIS is focally present 1.4 mm to the superior margin.   BREAST LUMPECTOMY Left    re-excision   BREAST LUMPECTOMY WITH NEEDLE LOCALIZATION Left 10/07/2020   Procedure: BREAST LUMPECTOMY WITH NEEDLE LOCALIZATION;  Surgeon: Earline Mayotte, MD;  Location: [facility];  Service: General;  Laterality: Left;   BREAST SURGERY Right 2002   lumpectomy   CATARACT EXTRACTION W/ INTRAOCULAR LENS  IMPLANT, BILATERAL     CHOLECYSTECTOMY     COLONOSCOPY  2013   COLONOSCOPY WITH PROPOFOL N/A 12/08/2015   Procedure:  COLONOSCOPY WITH PROPOFOL;  Surgeon: Kieth Brightly, MD;  Location: [facility];  Service: Endoscopy;  Laterality: N/A;   COLONOSCOPY WITH PROPOFOL N/A 12/06/2017   Procedure: COLONOSCOPY WITH PROPOFOL;  Surgeon: Wyline Mood, MD;  Location: [facility]COPY;  Service: Gastroenterology;  Laterality: N/A;   COLONOSCOPY WITH PROPOFOL N/A 12/22/2017   Procedure: COLONOSCOPY WITH PROPOFOL;  Surgeon: Wyline Mood, MD;  Location: [facility] ENDOSCOPY;  Service: Gastroenterology;  Laterality: N/A;   ESOPHAGOGASTRODUODENOSCOPY (EGD) WITH PROPOFOL N/A 12/08/2015   Procedure: ESOPHAGOGASTRODUODENOSCOPY (EGD) WITH PROPOFOL;  Surgeon: Kieth Brightly, MD;  Location: [facility];  Service: Endoscopy;   Laterality: N/A;   ESOPHAGOGASTRODUODENOSCOPY (EGD) WITH PROPOFOL N/A 12/06/2017   Procedure: ESOPHAGOGASTRODUODENOSCOPY (EGD) WITH PROPOFOL;  Surgeon: Wyline Mood, MD;  Location: [facility] ENDOSCOPY;  Service: Gastroenterology;  Laterality: N/A;   ESOPHAGOGASTRODUODENOSCOPY (EGD) WITH PROPOFOL N/A 12/22/2017   Procedure: ESOPHAGOGASTRODUODENOSCOPY (EGD) WITH PROPOFOL;  Surgeon: Wyline Mood, MD;  Location: [facility] ENDOSCOPY;  Service: Gastroenterology;  Laterality: N/A;   ESOPHAGOGASTRODUODENOSCOPY (EGD) WITH PROPOFOL N/A 08/30/2023   Procedure: ESOPHAGOGASTRODUODENOSCOPY (EGD) WITH PROPOFOL;  Surgeon: Wyline Mood, MD;  Location: [facility] ENDOSCOPY;  Service: Gastroenterology;  Laterality: N/A;   EYE SURGERY Right 2014   EYE SURGERY Left 2014   GIVENS CAPSULE STUDY N/A 01/23/2018   Procedure: GIVENS CAPSULE STUDY;  Surgeon: Wyline Mood, MD;  Location: [facility] ENDOSCOPY;  Service: Gastroenterology;  Laterality: N/A;   iron infusion     had 5 iron infusions for anemia   post radiation therapy Right    right breast cancer 2002   RE-EXCISION OF BREAST CANCER,SUPERIOR MARGINS Left 11/09/2020   Procedure: RE-EXCISION OF BREAST CANCER,SUPERIOR MARGINS;  Surgeon: Earline Mayotte, MD;  Location: [facility];  Service: General;  Laterality: Left;   Stab pheblectomy   2010   vein closure procedure Bilateral 2008    Family History  Problem Relation Age of Onset   Heart attack Mother    Heart attack Father    Heart attack Sister    Stroke Sister    Heart attack Brother    Other Brother        cerebral hemorrhage, sepsis   Breast cancer Cousin 50       breast/maternal   Cancer Cousin 60       breast/maternal   Breast cancer Cousin    Breast cancer Maternal Aunt 44   Breast cancer Other 48       maternal neice    Social History:  reports that she has never smoked. She has never been exposed to tobacco smoke. She has never used smokeless tobacco. She reports that she does not drink alcohol and does not use  drugs.  Allergies:  Allergies  Allergen Reactions   Zometa [Zoledronic Acid]     Severe back pain    Medications reviewed.     ROS Full ROS performed and is otherwise negative other than what is stated in the HPI    BP (!) 158/69   Pulse 78   Temp 97.9 F (36.6 C) (Oral)   Ht 5' 2.5" (1.588 m)   Wt 143 lb 3.2 oz (65 kg)   SpO2 99%   BMI 25.77 kg/m   Physical Exam  Physical Exam exam done with chaperone present CONSTITUTIONAL: NAD. EYES: Pupils are equal, round, Sclera are non-icteric. EARS, NOSE, MOUTH AND THROAT: The oral mucosa is pink and moist. Hearing is intact to voice. LYMPH NODES:  Lymph nodes in the neck are normal. RESPIRATORY:  Lungs are clear. There is  normal respiratory effort, with equal breath sounds bilaterally, and without pathologic use of accessory muscles.CARDIOVASCULAR: Heart is regular without murmurs, gallops, or rubs. GI: The abdomen is  soft, nontender, and nondistended. There are no palpable masses. There is no hepatosplenomegaly. There are normal bowel sounds in all quadrants. GU: Rectal deferred.   MUSCULOSKELETAL: Normal muscle strength and tone. No cyanosis or edema.   SKIN: Turgor is good and there are no pathologic skin lesions or ulcers. NEUROLOGIC: Motor and sensation is grossly normal. Cranial nerves are grossly intact. PSYCH:  Oriented to person, place and time. Affect is normal.  Assessment/Plan:  83 year old female with a giant paraesophageal hernia that has significant symptoms I  Discussed with patient in detail about her disease process.   DisCussed with the patient in detail about robotic paraesophageal hernia repair.  What this will entail.  The risks and benefits and the possible complications.  Included but not limited to bleeding, infection, dysphagia, bowel or esophageal injuries, chronic bloating and chronic pain  Also discussed with her about diet medication. She seems to be interested in moving forward with  paraesophageal hernia repair robotically. Plan robotic repair paraesophageal hernia repair with partial fundoplication in the near future Please note that I spent 40 minutes in this encounter including person reviewing extensive medical records, reviewing imaging studies, coordinating her care, placing orders and performing documentation     Sterling Big, MD [facility] General Surgeon

## 2023-10-04 NOTE — H&P (View-Only) (Signed)
Surgical Consultation  10/04/2023  Claire Kane is an 83 y.o. female.   Chief Complaint  Patient presents with   Follow-up    Hiatal hernia 10/09/22     HPI:  Claire Kane is a 83 y.o. female seen in follow-up for  large paraesophageal hernia.    More recently episode of gastric outlet obstruction that required NG decompression over the holidays. Have feels better and is on a full liquid diet    She does have episodes of regurgitation.  She does have some dysphagia for certain meals such as bread.  She does have a history of iron deficiency anemia and had a EGD   I have personally reviewed the images showing evidence of a large hiatal hernia.  She does have some constipation.  SHe also reports early satiety. Her symptoms get worse when she lays down.  Does have some intermittent cough hemoglobin 11.5 Prior surgical history includes abdominal hysterectomy as well as ectomy of the breast for cancer SHe did have a recent CT and barium swallow that have personally reviewed showing evidence of a giant paraesophageal hernia with entire stomach within the mediastinum.  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   Personal history of radiation therapy 2002, 2022   BREAST CA   Rosacea    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 complications     Past Surgical History:  Procedure Laterality Date   ABDOMINAL  HYSTERECTOMY     BLEPHAROPLASTY     BREAST BIOPSY Left 09/15/2020   affirm bx, x marker, DCIS   BREAST BIOPSY Left 09/07/2022   Left Breast Stereo Bx X clip- path pending   BREAST BIOPSY Left 09/07/2022   MM LT BREAST BX W LOC DEV 1ST LESION IMAGE BX SPEC STEREO GUIDE 09/07/2022 [facility]   BREAST BIOPSY Left 09/21/2023   u/s bx ribbon clip, path pending   BREAST BIOPSY Left 09/21/2023   [place] LT BREAST BX W LOC DEV 1ST LESION IMG BX SPEC US GUIDE 09/21/2023 [facility]   BREAST CYST ASPIRATION Left 2003   BREAST EXCISIONAL BIOPSY Right 2002   POS   BREAST LUMPECTOMY Right 2002   w/ radiation   BREAST LUMPECTOMY Left 10/07/2020   RESIDUAL HIGH-GRADE DUCTAL CARCINOMA IN SITU (DCIS) DCIS is focally present 1.4 mm to the superior margin.   BREAST LUMPECTOMY Left    re-excision   BREAST LUMPECTOMY WITH NEEDLE LOCALIZATION Left 10/07/2020   Procedure: BREAST LUMPECTOMY WITH NEEDLE LOCALIZATION;  Surgeon: Earline Mayotte, MD;  Location: [facility];  Service: General;  Laterality: Left;   BREAST SURGERY Right 2002   lumpectomy   CATARACT EXTRACTION W/ INTRAOCULAR LENS  IMPLANT, BILATERAL     CHOLECYSTECTOMY     COLONOSCOPY  2013   COLONOSCOPY WITH PROPOFOL N/A 12/08/2015   Procedure:  COLONOSCOPY WITH PROPOFOL;  Surgeon: Kieth Brightly, MD;  Location: [facility];  Service: Endoscopy;  Laterality: N/A;   COLONOSCOPY WITH PROPOFOL N/A 12/06/2017   Procedure: COLONOSCOPY WITH PROPOFOL;  Surgeon: Wyline Mood, MD;  Location: [facility];  Service: Gastroenterology;  Laterality: N/A;   COLONOSCOPY WITH PROPOFOL N/A 12/22/2017   Procedure: COLONOSCOPY WITH PROPOFOL;  Surgeon: Wyline Mood, MD;  Location: [facility] ENDOSCOPY;  Service: Gastroenterology;  Laterality: N/A;   ESOPHAGOGASTRODUODENOSCOPY (EGD) WITH PROPOFOL N/A 12/08/2015   Procedure: ESOPHAGOGASTRODUODENOSCOPY (EGD) WITH PROPOFOL;  Surgeon: Kieth Brightly, MD;  Location: [facility];  Service: Endoscopy;   Laterality: N/A;   ESOPHAGOGASTRODUODENOSCOPY (EGD) WITH PROPOFOL N/A 12/06/2017   Procedure: ESOPHAGOGASTRODUODENOSCOPY (EGD) WITH PROPOFOL;  Surgeon: Wyline Mood, MD;  Location: [facility] ENDOSCOPY;  Service: Gastroenterology;  Laterality: N/A;   ESOPHAGOGASTRODUODENOSCOPY (EGD) WITH PROPOFOL N/A 12/22/2017   Procedure: ESOPHAGOGASTRODUODENOSCOPY (EGD) WITH PROPOFOL;  Surgeon: Wyline Mood, MD;  Location: [facility]OSCOPY;  Service: Gastroenterology;  Laterality: N/A;   ESOPHAGOGASTRODUODENOSCOPY (EGD) WITH PROPOFOL N/A 08/30/2023   Procedure: ESOPHAGOGASTRODUODENOSCOPY (EGD) WITH PROPOFOL;  Surgeon: Wyline Mood, MD;  Location: [facility] ENDOSCOPY;  Service: Gastroenterology;  Laterality: N/A;   EYE SURGERY Right 2014   EYE SURGERY Left 2014   GIVENS CAPSULE STUDY N/A 01/23/2018   Procedure: GIVENS CAPSULE STUDY;  Surgeon: Wyline Mood, MD;  Location: [facility] ENDOSCOPY;  Service: Gastroenterology;  Laterality: N/A;   iron infusion     had 5 iron infusions for anemia   post radiation therapy Right    right breast cancer 2002   RE-EXCISION OF BREAST CANCER,SUPERIOR MARGINS Left 11/09/2020   Procedure: RE-EXCISION OF BREAST CANCER,SUPERIOR MARGINS;  Surgeon: Earline Mayotte, MD;  Location: [facility];  Service: General;  Laterality: Left;   Stab pheblectomy   2010   vein closure procedure Bilateral 2008    Family History  Problem Relation Age of Onset   Heart attack Mother    Heart attack Father    Heart attack Sister    Stroke Sister    Heart attack Brother    Other Brother        cerebral hemorrhage, sepsis   Breast cancer Cousin 50       breast/maternal   Cancer Cousin 60       breast/maternal   Breast cancer Cousin    Breast cancer Maternal Aunt 44   Breast cancer Other 48       maternal neice    Social History:  reports that she has never smoked. She has never been exposed to tobacco smoke. She has never used smokeless tobacco. She reports that she does not drink alcohol and does not use  drugs.  Allergies:  Allergies  Allergen Reactions   Zometa [Zoledronic Acid]     Severe back pain    Medications reviewed.     ROS Full ROS performed and is otherwise negative other than what is stated in the HPI    BP (!) 158/69   Pulse 78   Temp 97.9 F (36.6 C) (Oral)   Ht 5' 2.5" (1.588 m)   Wt 143 lb 3.2 oz (65 kg)   SpO2 99%   BMI 25.77 kg/m   Physical Exam  Physical Exam exam done with chaperone present CONSTITUTIONAL: NAD. EYES: Pupils are equal, round, Sclera are non-icteric. EARS, NOSE, MOUTH AND THROAT: The oral mucosa is pink and moist. Hearing is intact to voice. LYMPH NODES:  Lymph nodes in the neck are normal. RESPIRATORY:  Lungs are clear. There is  normal respiratory effort, with equal breath sounds bilaterally, and without pathologic use of accessory muscles.CARDIOVASCULAR: Heart is regular without murmurs, gallops, or rubs. GI: The abdomen is  soft, nontender, and nondistended. There are no palpable masses. There is no hepatosplenomegaly. There are normal bowel sounds in all quadrants. GU: Rectal deferred.   MUSCULOSKELETAL: Normal muscle strength and tone. No cyanosis or edema.   SKIN: Turgor is good and there are no pathologic skin lesions or ulcers. NEUROLOGIC: Motor and sensation is grossly normal. Cranial nerves are grossly intact. PSYCH:  Oriented to person, place and time. Affect is normal.  Assessment/Plan:  83 year old female with a giant paraesophageal hernia that has significant symptoms I  Discussed with patient in detail about her disease process.   DisCussed with the patient in detail about robotic paraesophageal hernia repair.  What this will entail.  The risks and benefits and the possible complications.  Included but not limited to bleeding, infection, dysphagia, bowel or esophageal injuries, chronic bloating and chronic pain  Also discussed with her about diet medication. She seems to be interested in moving forward with  paraesophageal hernia repair robotically. Plan robotic repair paraesophageal hernia repair with partial fundoplication in the near future Please note that I spent 40 minutes in this encounter including person reviewing extensive medical records, reviewing imaging studies, coordinating her care, placing orders and performing documentation     Sterling Big, MD [facility] General Surgeon

## 2023-10-04 NOTE — Telephone Encounter (Signed)
-----   Message from Timmy Forbes sent at 10/03/2023  8:59 PM EST ----- Iron  level is low. Please recommend patient to start oral Vitron C 1 tab daily.  Rx sent.

## 2023-10-04 NOTE — Patient Instructions (Signed)
 We have spoken today about repairing your Hiatal Hernia. Your surgery will be scheduled at [facility] with Dr. Everlene Farrier.  Plan to be in the hospital for 1-2 days if the minimally invasive surgery is completed without having to make a bigger incision. If the bigger incision is made, you will most likely need to be in the hospital 4-6 days. You will be on a soft diet and need to recover for 2 weeks following your surgery prior to doing any of your normal activities. At the 2 week mark, we will see you in the office and if you are doing ok we will advance your diet and activity level as you tolerate.  Please see your Blue (Pre-care) Sheet for more information regarding your surgery. Our surgery scheduler will call you to verify surgery date and to go over information  You will need to arrange to be out of work for approximately 1-2 weeks and then you may return with a lifting restriction for 4 more weeks. If you have FMLA or Disability paperwork that needs to be filled out, please have your company fax your paperwork to [phone] or you may drop this by either office. This paperwork will be filled out within 3 days after your surgery has been completed.  Please call our office with any questions or concerns that you have regarding your surgery and recovery.    Laparoscopic Nissen Fundoplication Laparoscopic Nissen fundoplication is surgery to relieve heartburn and other problems caused by gastric fluids flowing up into your esophagus. The esophagus is the tube that carries food and liquid from your throat to your stomach. Normally, the muscle that sits between your stomach and your esophagus (lower esophageal sphincter or LES) keeps stomach fluids in your stomach. In some people, the LES does not work properly, and stomach fluids flow up into the esophagus. This can happen when part of the stomach bulges through the LES (hiatal hernia). The backward flow of stomach fluids can cause a type of severe and  long-standing heartburn that is called gastroesophageal reflux disease (GERD). You may need this surgery if other treatments for GERD have not helped. In a laparoscopic Nissen fundoplication, the upper part of your stomach is wrapped around the lower part of your esophagus to strengthen the LES and prevent reflux. If you have a hiatal hernia, it will also be repaired with this surgery. The procedure is done through several small incisions in your abdomen. It is performed using a thin, telescopic instrument (laparoscope) and other instruments that can pass through the scope or through other small incisions. Tell a health care provider about: Any allergies you have. All medicines you are taking, including vitamins, herbs, eye drops, creams, and over-the-counter medicines. Any problems you or family members have had with anesthetic medicines. Any blood disorders you have. Any surgeries you have had. Any medical conditions you have. What are the risks? Generally, this is a safe procedure. However, problems may occur, including: Difficulty swallowing (dysphagia). Bloating. Nausea or vomiting. Damage to the lung, causing a collapsed lung. Infection or bleeding. What happens before the procedure? Ask your health care provider about: Changing or stopping your regular medicines. This is especially important if you are taking diabetes medicines or blood thinners. Taking medicines such as aspirin and ibuprofen. These medicines can thin your blood. Do not take these medicines before your procedure if your health care provider asks you not to. Follow your health care provider's instructions about eating or drinking restrictions. Plan to have someone take  you home after the procedure. What happens during the procedure? An IV tube will be inserted into one of your veins. It will be used to give you fluids and medicines during the procedure. You will be given a medicine that makes you fall asleep (general  anesthetic). Your abdomen will be cleaned with a germ-killing solution (antiseptic). The surgeon will make a small incision in your abdomen and insert a tube through the incision. Your abdomen will be filled with a gas. This helps the surgeon to see your organs more easily and it makes more space to work. The surgeon will insert the laparoscope through the incision. The scope has a camera that will send pictures to a monitor in the operating room. The surgeon will make several other small incisions in your abdomen to insert the other instruments that are needed during the procedure. Another instrument (dilator) will be passed through your mouth and down your esophagus into the upper part of your stomach. The dilator will prevent your LES from being closed too tightly during surgery. The surgeon will pass the top portion of your stomach behind the lower part of your esophagus and wrap it all the way around. This will be stitched into place. If you have a hiatal hernia, it will be repaired during this procedure. All instruments will be removed, and your incisions will be closed under your skin with stitches (sutures). Skin adhesive strips may also be used. A bandage (dressing) will be placed on your skin over the incisions. The procedure may vary among health care providers and hospitals. What happens after the procedure? You will be moved to a recovery area. Your blood pressure, heart rate, breathing rate, and blood oxygen level will be monitored often until the medicines you were given have worn off. You will be given pain medicine as needed. Your IV tube will be kept in until you are able to drink fluids. This information is not intended to replace advice given to you by your health care provider. Make sure you discuss any questions you have with your health care provider. Document Released: 09/26/2014 Document Revised: 02/11/2016 Document Reviewed: 05/07/2014 Elsevier Interactive Patient  Education  2017 [employer]   Laparoscopic Nissen Fundoplication, Care After Refer to this sheet in the next few weeks. These instructions provide you with information about caring for yourself after your procedure. Your health care provider may also give you more specific instructions. Your treatment has been planned according to current medical practices, but problems sometimes occur. Call your health care provider if you have any problems or questions after your procedure. What can I expect after the procedure? After the procedure, it is common to have: Difficulty swallowing (dysphagia). Excess gas (bloating). Follow these instructions at home: Medicines  Take medicines only as directed by your health care provider. Do not drive or operate heavy machinery while taking pain medicine. Incision care  There are many different ways to close and cover an incision, including stitches (sutures), skin glue, and adhesive strips. Follow your health care provider's instructions about: Incision care. Bandage (dressing) changes and removal. Incision closure removal. Check your incision areas every day for signs of infection. Watch for: Redness, swelling, or pain. Fluid, blood, or pus. Do not take baths, swim, or use a hot tub until your health care provider approves. Take showers as directed by your health care provider. Eating and drinking  Follow your health care provider's instructions about eating. You may need to follow a very soft diet for 2  weeks, followed by a diet of more regular foods for 2 weeks, no breads. You should return to your usual diet gradually. Drink enough fluid to keep your urine clear or pale yellow. Activity  Return to your normal activities as directed by your health care provider. Ask your health care provider what activities are safe for you. Avoid strenuous exercise. Do not lift anything that is heavier than 10 lb (4.5 kg). Ask your health care provider when you  can: Return to sexual activity. Drive. Go back to work. Contact a health care provider if: You have a fever. Your pain gets worse or is not helped by medicine. You have frequent nausea or vomiting. You have continued abdominal bloating. You have an ongoing (persistent) cough. You have redness, swelling, or pain in any incision areas. You have fluid, blood, or pus coming from any incisions. Get help right away if: You have trouble breathing. You are unable to swallow. You have persistent vomiting. You have blood in your vomit. You have severe abdominal pain. This information is not intended to replace advice given to you by your health care provider. Make sure you discuss any questions you have with your health care provider. Document Released: 04/28/2004 Document Revised: 02/11/2016 Document Reviewed: 05/07/2014 Elsevier Interactive Patient Education  2017 [employer] Inc.   Diet After Nissen Fundoplication Surgery This diet information is for patients who have recently had Nissen fundoplication surgery to correct reflux disease or to repair various types of hernias, such as hiatal hernia and intrathoracic stomach. This diet may also be used for other gastrointestinal surgeries, such as Heller myotomy and repair of achalasia. The diet will help control diarrhea, excess gas and swallowing problems, which may occur after this type of surgery. Keeping Your Stomach from Stretching Eat small, frequent meals (six to eight per day). This will help you consume the majority of the nutrients you need without causing your stomach to feel full or distended.  Drinking large amounts of fluids with meals can stretch your stomach. You may drink fluids between meals as often as you like, but limit fluids to 1/2 cup (4 fluid ounces) with meals and one cup (8 fluid ounces) with snacks.  Sit upright while eating and stay upright for 30 minutes after each meal. Gravity can help food move through your digestive  tract. Do not lie down after eating. Sit upright for 2 hours after your last meal or snack of the day.  Eat very slowly. Take your time when eating.  Take small bites and chew your food well to help aid in swallowing and digestion.  Avoid crusty breads and sticky, gummy foods, such as bananas, fresh doughy breads, rolls and doughnuts. These types of foods become sticky and difficult to swallow.  Toasted breads tend to be better tolerated.  Lastly, if you eat sweets, consume them at the end of your meal to avoid a group of symptoms referred to as "dumping syndrome". This describes the rapid emptying of foods from the stomach to the small intestine. Sweetened beverages, candy and desserts move more rapidly and dump quickly into the intestines. This can cause symptoms of nausea, weakness, cold sweats, cramps, diarrhea and dizzy spells.  Avoiding Gas Avoid drinking through a straw. Do not chew gum or tobacco. These actions cause you to swallow air, which produces excess gas in your stomach. Chew with your mouth closed.  Avoid any foods that cause stomach gas and distention. These foods include corn, dried beans, peas, lentils, onions, broccoli, cauliflower  and any food from the cabbage family.  Avoid carbonated drinks, alcohol, citrus and tomato products.  When will I be able to eat a soft diet? After Nissen fundoplication surgery, your diet will be advanced slowly by your surgeon. Generally, you will be on a clear liquid diet for the first few meals. Then you will advance to the full liquid diet for a meal or two and eventually to a Nissen soft diet. Please be aware that each patient's tolerance to food is different. Your doctor will advance your diet depending on how well you progress after surgery. Clear Liquid Diet  The first diet after surgery is the clear liquid diet. It includes the following liquids: Apple juice  Cranberry juice  Grape juice  Chicken broth  Beef broth  Flavored gelatin  (Jell-O)  Decaf tea and coffee  Caffeinated beverages are permitted based on tolerance  Popsicles  [place] ice Carbonated drinks (sodas) are not allowed for the first six to eight weeks after surgery. After this time you can try them again in small amounts.  Full Liquid Diet The full liquid diet contains anything on the clear liquid diet, plus: Milk, soy, rice and almond (no chocolate)  Cream of wheat, cream of rice, grits  Strained creamed soups (no tomato or broccoli)  Vanilla and strawberry-flavored ice cream  Sherbet  Blended, custard styled or whipped yogurt (plain or vanilla only)  Vanilla and butterscotch pudding (no chocolate or coconut)  Nutritional drinks including Ensure, Boost, Carnation Instant Breakfast (no chocolate-flavored) Note: Dairy products, such as milk, ice cream and pudding, may cause diarrhea in some people just after surgery. You may need to avoid milk products. If so, substitute them with lactose-free beverages, such as soy, rice, Lactaid or almond milks.  Nissen Soft Diet Food Category Foods to Choose Foods to Avoid  Beverages Milk, such as, whole, 2%, 1%, non-fat, or skim, soy, rice, almond  Caffeinated and decaf tea and coffee  Powdered drink mixes (in moderation)  Non-citrus juices (apple, grape, cranberry or blends of these)  Fruit nectars  Nutritional drinks including Boost, Ensure, Carnation Instant Breakfast Chocolate milk, cocoa or other chocolate-flavored drinks  Carbonated drinks  Alcohol  Citrus juices like orange, grapefruit, lemon and lime  Breads Crackers (saltine, butter, soda, graham, Goldfish and Cheese Nips)   Untoasted bread, bagels, [place] and hard rolls, English muffins  Crackers with nuts, seeds, fresh or dried fruit, coconut, or highly seasoned, such as garlic or onion-flavored  Sweet rolls, coffee cake or doughnuts  Cereals Well cooked cereals, such as oatmeal (plain or flavored)  Cold cereal (Cornflakes, Rice  Krispies, Cheerios, Special K plain, Rice Chex and puffed rice) Very coarse cereal, such as bran, shredded wheat  Any cereal with fresh or dried fruit, coconut, seeds or nuts  Desserts Eat in moderation and do not eat desserts or sweets by themselves. Plain cakes, cookies and cream-filled pies  Vanilla and butterscotch pudding or custard  Ice cream, ice milk, frozen yogurt and sherbet  Gelatin made from allowed foods  Fruit ices and popsicles Desserts containing chocolate, coconut, nuts, seeds, fresh or dried fruit, peppermint or spearmint  Eggs  Poached, hard boiled or scrambled Fried eggs and highly seasoned eggs (deviled eggs)  Fats Eat in moderation. Butter and margarine  Mayonnaise and vegetable oils  Mildly seasoned cream sauces and gravies  Plain cream cheese  Sour cream Highly seasoned salad dressings, cream sauces and gravies  Bacon, bacon fat, ham fat, lard and salt pork  Foy [place]  foods  Nuts  Fruits Fruit juice  Any canned or cooked fruit except those listed in the AVOID column ALL fresh fruits, such as citrus, apples, and pineapple  Canned pineapple  Dried fruits, such as raisins, berries  Fruits with seeds, such as berries, kiwi and figs  Meat, Fish, Poultry, and [employer] may be ground, minced or chopped to ease swallowing and digestion  Tender, well cooked and moist cuts of beef, chicken, [place] and pork  Veal and lamb  Flaky, cooked fish  Canned tuna  Cottage and ricotta cheeses  Mild cheese, such as American, brick, mozzarella and baby Swiss  Creamy peanut butter  Plain custard or blended fruit yogurt  Moist casseroles, such as macaroni & cheese, tuna noodle  Grilled or toasted cheese sandwich Tough meats with a lot of gristle  Fried, highly seasoned, smoked and fatty meat, fish or poultry, such as frankfurters, luncheon meats, sausage, bacon, spare ribs, beef brisket, sardines, anchovies, duck and goose  Chili and other entrees made with pepper or  chili pepper  Shellfish  Strongly flavored cheeses, such as sharp cheese, extra sharp cheddar, cheese containing peppers or other seasonings  Crunchy peanut butter  Any yogurt with nuts, seeds, coconut, strawberries or raspberries  Potatoes and Starches Peeled, mashed or boiled white or sweet potatoes  Oven-baked potatoes without skin  Well cooked white rice, enriched noodles, barley, spaghetti, macaroni and other pastas Fried potatoes, potato skins and potato chips  Hard and soft taco shells  Fried, brown or wild rice  Soups Mildly flavored meat stocks  Cream soups made from allowed foods Highly seasoned soups and tomato based soups, cream soups made with gas producing vegetables, such as broccoli, cauliflower, onion, etc.  Sweets and Snacks Use in moderation and do not eat large amounts of sweets by themselves. Syrup, honey, jelly and seedless jam  Plain hard candies and plain candies made with allowed ingredients  Molasses  Marshmallows  Other candy made from allowed ingredients  Thin pretzels Jam, marmalade and preserves  Chocolate in any form  Any candy containing nuts, coconut, seeds, peppermint, spearmint or dried or fresh fruit  Popcorn, potato chips, tortilla chips  Soft or hard thick pretzels, such as sourdough  Vegetables Well cooked soft vegetables without seeds or skins, such as asparagus tips, beets, carrots, green and wax beans, chopped spinach, tender canned baby peas, squash and pumpkin Raw vegetables, tomatoes, tomato juice, tomato sauce and V-8 juice(tomato based products can irritate the stomach)  Gas producing vegetables, such as broccoli, Brussel sprouts, cabbage, cauliflower, onions, corn, cucumber, green peppers, rutabagas, turnips, radishes and sauerkraut  Dried beans, peas and lentils  Miscellaneous Salt and spices in moderation  Mustard and vinegar in moderation Fried or highly seasoned foods  Coconut and seeds  Pickles and olives  Chili sauces, ketchup,  barbecue sauce, horseradish, black pepper, chili powder and onion and garlic seasonings  Any other strongly flavored seasoning, condiment, spice or herb not tolerated  Any food not tolerated

## 2023-10-05 ENCOUNTER — Encounter: Payer: Self-pay | Admitting: Family Medicine

## 2023-10-05 ENCOUNTER — Ambulatory Visit: Payer: Medicare PPO | Admitting: Family Medicine

## 2023-10-05 VITALS — BP 117/68 | HR 75 | Ht 62.0 in | Wt 146.0 lb

## 2023-10-05 DIAGNOSIS — K219 Gastro-esophageal reflux disease without esophagitis: Secondary | ICD-10-CM | POA: Diagnosis not present

## 2023-10-05 DIAGNOSIS — I6529 Occlusion and stenosis of unspecified carotid artery: Secondary | ICD-10-CM

## 2023-10-05 DIAGNOSIS — I10 Essential (primary) hypertension: Secondary | ICD-10-CM | POA: Diagnosis not present

## 2023-10-05 DIAGNOSIS — M1A09X0 Idiopathic chronic gout, multiple sites, without tophus (tophi): Secondary | ICD-10-CM

## 2023-10-05 DIAGNOSIS — M150 Primary generalized (osteo)arthritis: Secondary | ICD-10-CM | POA: Diagnosis not present

## 2023-10-05 DIAGNOSIS — M8580 Other specified disorders of bone density and structure, unspecified site: Secondary | ICD-10-CM

## 2023-10-05 DIAGNOSIS — K440 Diaphragmatic hernia with obstruction, without gangrene: Secondary | ICD-10-CM | POA: Diagnosis not present

## 2023-10-05 MED ORDER — TRAMADOL HCL 50 MG PO TABS: 50.0000 mg | ORAL_TABLET | Freq: Three times a day (TID) | ORAL | 0 refills | Status: DC

## 2023-10-05 NOTE — Assessment & Plan Note (Signed)
Chronic  Managed with metoprolol and spironolactone however patient was not initially treated with spironolactone and would like to DC this agent for BP control given normal to low Blood pressure in the past  Will reassess need for spironolactone after her surgery

## 2023-10-05 NOTE — Progress Notes (Signed)
Established patient visit   Patient: Claire Kane   DOB: Mar 27, 1941   83 y.o. Female  MRN: [id number] Visit Date: 10/05/2023  Today's healthcare provider: Ronnald Ramp, MD   Chief Complaint  Patient presents with   Follow-up    4 months Pt stated--went to ER for Hernia, vomiting--but feeling much better. DOS-10/13/22   Subjective     HPI     Follow-up    Additional comments: 4 months Pt stated--went to ER for Hernia, vomiting--but feeling much better. DOS-10/13/22      Last edited by Shelly Bombard, CMA on 10/05/2023  9:17 AM.       Discussed the use of AI scribe software for clinical note transcription with the patient, who gave verbal consent to proceed.  History of Present Illness   The patient is an 83 year old female with a history of hiatal hernia, osteoarthritis, and anemia. She presented for follow-up after an ER visit for vomiting due to gastrointestinal obstruction secondary to the hiatal hernia. The patient was admitted to the hospital, treated with an NG tube and gastric decompression, and discharged after resolution of nausea and vomiting. She is scheduled for hiatal hernia repair surgery.  The patient reported a significant episode of nausea and vomiting on Christmas Eve and Christmas Day, which led to hospitalization. She attributed this episode to overindulgence during a family celebration, despite knowing she should eat in small portions due to her hiatal hernia. This episode brought her to the realization that surgical intervention was necessary.  In addition to the hiatal hernia, the patient reported chronic back pain, which she believes is due to osteoarthritis. She reported no recent injury or strain to the back. The pain is located on the left side in the upper lumbar region. She has been managing the pain with tramadol and Tylenol, but reported that the tramadol does not provide significant relief.  The patient also reported a recent  increase in shortness of breath, which she suspects may be due to anemia. She has a history of anemia requiring infusions, and recent labs showed a decrease in hemoglobin to 10.3. She is currently on a full liquid diet as per her doctor's instructions in preparation for her upcoming surgery.      Pt is a 83 y.o. female who is here for preoperative assessment for robotic repair of large hiatal hernia under general anesthesia.   1. High Risk Cardiac Conditions  1) Recent MI - No.  2) Decompensated Heart Failure - No.  3) Unstable angina - No.  4) Symptomatic arrythmia - No.  5) Sx Valvular Disease - No.  2. Intermediate Risk Factors - DM, CKD, CVA, CHF, CAD - No.  3. Functional Status - > 4 mets (Walk, run, climb stairs) Yes.  Kateri Mc Activity Status Index: 34.7  4. Surgery Specific Risk - High  (Emergency, Vascular, Intra-abdominal, Extensive ops)          Intermediate (Carotid, Head and Neck, Orthopaedic )          Low (Endoscopic, Cataract, Breast )  5. Further Noninvasive evaluation -   1) EKG - No.   1) Hx of CVA, CAD, DM, CKD  2) Echo - No.   1) Worsening dyspnea   3) Stress Testing - Active Cardiac Disease - No.    7. Anticoagulation Therapy : No.          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   Cataract    Difficult intubation    Ductal carcinoma in situ (DCIS) of right breast    GERD (gastroesophageal reflux disease)    Heart murmur 2001   Hiatal hernia    High cholesterol    Hypertension    Malignant tumor of breast (HCC) 09/19/2020   Osteoarthritis of carpometacarpal [facility]) joint of thumb 06/06/2023   Personal history of radiation therapy 2002, 2022   BREAST CA   Rosacea    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 complications      Medications: Outpatient Medications Prior to Visit  Medication Sig   amLODipine (NORVASC) 5 MG tablet TAKE 1 TABLET EVERY DAY   atorvastatin (LIPITOR) 10 MG tablet TAKE 1 TABLET EVERY DAY   clotrimazole-betamethasone (LOTRISONE) cream Apply 1 Application topically 2 (two) times daily. (Patient taking differently: Apply 1 Application topically 2 (two) times daily as needed.)   exemestane (AROMASIN) 25 MG tablet TAKE 1 TABLET EVERY DAY AFTER BREAKFAST   ezetimibe (ZETIA) 10 MG tablet TAKE 1 TABLET EVERY DAY   Glucosamine 750 MG TABS Take 1,500 mg by mouth daily.   Glycerin-Polysorbate 80 (REFRESH DRY EYE THERAPY OP) Place 1 drop into both eyes 2 (two) times daily.   Multiple Vitamins-Minerals (MULTIVITAMIN WITH MINERALS) tablet Take 1 tablet by mouth daily.   omeprazole (PRILOSEC) 40 MG capsule Take 1 capsule (40 mg total) by mouth daily.   polyethylene glycol (MIRALAX) 17 g packet Take 17 g by mouth daily.   simethicone (MYLICON) 80 MG chewable tablet Chew 80 mg by mouth every 6 (six) hours as needed for flatulence.   solifenacin (VESICARE) 10 MG tablet Take 1 tablet (10 mg total) by mouth daily.   SOOLANTRA 1 % CREA Apply 1 application  topically daily as needed (rosacea).   spironolactone (ALDACTONE) 50 MG tablet Take 50 mg by mouth daily.   [DISCONTINUED] traMADol (ULTRAM) 50 MG tablet Take 1 tablet (50 mg total) by mouth every 12 (twelve) hours as needed.   Iron-Vitamin C (VITRON-C) 65-125 MG TABS Take 1 tablet by mouth daily. (Patient not taking: Reported on 10/05/2023)   Facility-Administered Medications Prior to Visit  Medication Dose Route Frequency Provider   0.9 %  sodium chloride infusion   Intravenous Once [name], MD    Review of Systems  Last CBC Lab Results  Component Value Date   WBC 6.8 09/27/2023   HGB 10.3 (L) 09/27/2023   HCT 31.5 (L) 09/27/2023   MCV 85.4 09/27/2023   MCH 27.9 09/27/2023   RDW 13.7 09/27/2023   PLT 462 (H) 09/27/2023   Last metabolic  panel Lab Results  Component Value Date   GLUCOSE 101 (H) 09/27/2023   NA 135 09/27/2023   K 4.4 09/27/2023   CL 102 09/27/2023   CO2 24 09/27/2023   BUN 14 09/27/2023   CREATININE 0.75 09/27/2023   GFRNONAA >60 09/27/2023   CALCIUM 9.2 09/27/2023   PHOS 3.9 12/13/2016   PROT 6.6 09/27/2023   ALBUMIN 4.2 09/27/2023   LABGLOB 2.6 07/26/2023   AGRATIO 2.1 09/01/2022   BILITOT 1.0 09/27/2023   ALKPHOS 48 09/27/2023   AST 21 09/27/2023   ALT 19 09/27/2023   ANIONGAP 9 09/27/2023   Last hemoglobin A1c Lab Results  Component Value Date  HGBA1C 5.8 (H) 09/01/2022   Last thyroid functions Lab Results  Component Value Date   TSH 1.830 03/08/2023        Objective    BP 117/68 (BP Location: Left Arm, Patient Position: Sitting, Cuff Size: Normal)   Pulse 75   Ht 5\' 2"  (1.575 m)   Wt 146 lb (66.2 kg)   SpO2 99%   BMI 26.70 kg/m  BP Readings from Last 3 Encounters:  10/05/23 117/68  10/04/23 (!) 158/69  09/27/23 136/69   Wt Readings from Last 3 Encounters:  10/05/23 146 lb (66.2 kg)  10/04/23 143 lb 3.2 oz (65 kg)  10/02/23 145 lb (65.8 kg)        Physical Exam Cardiovascular:     Rate and Rhythm: Normal rate and regular rhythm.     Heart sounds: Murmur heard.    CHEST: Clear lung sounds, no crackles or wheezes. MUSCULOSKELETAL: Tenderness in left upper lumbar region.        No results found for any visits on 10/05/23.  Assessment & Plan     Problem List Items Addressed This Visit       Cardiovascular and Mediastinum   HTN (hypertension)   Chronic  Managed with metoprolol and spironolactone however patient was not initially treated with spironolactone and would like to DC this agent for BP control given normal to low Blood pressure in the past  Will reassess need for spironolactone after her surgery       Carotid stenosis     Respiratory   Hiatal hernia with obstruction but no gangrene - Primary   83 year old admitted on December 25th with  gastrointestinal obstruction due to a hiatal hernia. Treated with NG tube and gastric decompression. Symptoms resolved, tolerating full liquid diet. Scheduled for surgical repair on January 25th. Initially refused surgery but decided to proceed after severe symptoms during holidays. Discussed risks (infection, bleeding, recurrence) and benefits (symptom resolution, prevention of future obstructions). Alternatives include continued medical management, deemed unacceptable by patient. - Continue full liquid diet until surgery - Hold spironolactone on the day of surgery - Double dose of omeprazole (40 mg) the night before and the morning of surgery - Completed surgical clearance paperwork and send to surgical team        Digestive   Acid reflux     Musculoskeletal and Integument   Osteopenia (Chronic)   Osteoarthritis   Chronic back pain likely due to osteoarthritis, located in upper lumbar region. Current management includes tramadol and Tylenol, but tramadol is not providing significant relief. Discussed adjusting tramadol to 50 mg every 8 hours. Patient prefers to avoid heating pad use around husband's oxygen equipment due to safety concerns. - Adjust tramadol to 50 mg every 8 hours - Continue Tylenol as needed - Monitor pain levels and adjust treatment as necessary      Relevant Medications   traMADol (ULTRAM) 50 MG tablet     Other   Gout       Anemia Hemoglobin level 10.3 as of January 8th. Reports mild shortness of breath, likely related to anemia. Previous hemoglobin levels as low as 7. Plan for possible infusion post-surgery if anemia persists. - Monitor hemoglobin levels - Plan for possible infusion post-surgery if anemia persists  General Health Maintenance Blood pressure well-controlled at 117/68. No current issues with mobility or use of assistive devices. No chest pain or significant shortness of breath reported. - Continue current blood pressure management - Follow up  with primary care physician in  10 weeks post-surgery to reassess blood pressure and overall health   Return in about 10 weeks (around 12/14/2023) for HTN, cont spiro?Ronnald Ramp, MD  [facility] [phone] (phone) [phone] (fax)  [facility]

## 2023-10-05 NOTE — Assessment & Plan Note (Signed)
83 year old admitted on December 25th with gastrointestinal obstruction due to a hiatal hernia. Treated with NG tube and gastric decompression. Symptoms resolved, tolerating full liquid diet. Scheduled for surgical repair on January 25th. Initially refused surgery but decided to proceed after severe symptoms during holidays. Discussed risks (infection, bleeding, recurrence) and benefits (symptom resolution, prevention of future obstructions). Alternatives include continued medical management, deemed unacceptable by patient. - Continue full liquid diet until surgery - Hold spironolactone on the day of surgery - Double dose of omeprazole (40 mg) the night before and the morning of surgery - Completed surgical clearance paperwork and send to surgical team

## 2023-10-05 NOTE — Assessment & Plan Note (Signed)
Chronic back pain likely due to osteoarthritis, located in upper lumbar region. Current management includes tramadol and Tylenol, but tramadol is not providing significant relief. Discussed adjusting tramadol to 50 mg every 8 hours. Patient prefers to avoid heating pad use around husband's oxygen equipment due to safety concerns. - Adjust tramadol to 50 mg every 8 hours - Continue Tylenol as needed - Monitor pain levels and adjust treatment as necessary

## 2023-10-06 DIAGNOSIS — G5603 Carpal tunnel syndrome, bilateral upper limbs: Secondary | ICD-10-CM | POA: Diagnosis not present

## 2023-10-09 NOTE — Progress Notes (Unsigned)
Medical Clearance has been received from Dr Neita Garnet. The patient is cleared at Medium risk for surgery.

## 2023-10-10 ENCOUNTER — Encounter: Payer: Self-pay | Admitting: Surgery

## 2023-10-10 ENCOUNTER — Other Ambulatory Visit: Payer: Self-pay

## 2023-10-10 ENCOUNTER — Encounter
Admission: RE | Disposition: A | Discharge status: Home / Self Care | Payer: Self-pay | Source: Ambulatory Visit | Attending: Surgery

## 2023-10-10 ENCOUNTER — Ambulatory Visit: Payer: Medicare PPO | Admitting: Anesthesiology

## 2023-10-10 ENCOUNTER — Ambulatory Visit: Payer: Medicare PPO | Admitting: Urgent Care

## 2023-10-10 ENCOUNTER — Observation Stay
Admission: RE | Admit: 2023-10-10 | Discharge: 2023-10-11 | Disposition: A | Discharge status: Home / Self Care | Payer: Medicare PPO | Source: Ambulatory Visit | Attending: Surgery | Admitting: Surgery

## 2023-10-10 DIAGNOSIS — K449 Diaphragmatic hernia without obstruction or gangrene: Secondary | ICD-10-CM | POA: Diagnosis not present

## 2023-10-10 DIAGNOSIS — I10 Essential (primary) hypertension: Secondary | ICD-10-CM | POA: Insufficient documentation

## 2023-10-10 DIAGNOSIS — Z853 Personal history of malignant neoplasm of breast: Secondary | ICD-10-CM | POA: Diagnosis not present

## 2023-10-10 DIAGNOSIS — K219 Gastro-esophageal reflux disease without esophagitis: Secondary | ICD-10-CM | POA: Insufficient documentation

## 2023-10-10 DIAGNOSIS — R1310 Dysphagia, unspecified: Secondary | ICD-10-CM | POA: Insufficient documentation

## 2023-10-10 DIAGNOSIS — Z8719 Personal history of other diseases of the digestive system: Principal | ICD-10-CM

## 2023-10-10 HISTORY — PX: XI ROBOTIC ASSISTED PARAESOPHAGEAL HERNIA REPAIR: SHX6871

## 2023-10-10 LAB — ABO/RH: ABO/RH(D): A POS

## 2023-10-10 LAB — CREATININE, SERUM
Creatinine, Ser: 0.67 mg/dL (ref 0.44–1.00)
GFR, Estimated: >60 mL/min (ref >60)

## 2023-10-10 LAB — TYPE AND SCREEN
ABO/RH(D): A POS
Antibody Screen: NEGATIVE

## 2023-10-10 LAB — CBC
HCT: 34.1 % — ABNORMAL LOW (ref 36.0–46.0)
Hemoglobin: 11.1 g/dL — ABNORMAL LOW (ref 12.0–15.0)
MCH: 27.5 pg (ref 26.0–34.0)
MCHC: 32.6 g/dL (ref 30.0–36.0)
MCV: 84.6 fL (ref 80.0–100.0)
Platelets: 378 10*3/uL (ref 150–400)
RBC: 4.03 MIL/uL (ref 3.87–5.11)
RDW: 13.9 % (ref 11.5–15.5)
WBC: 14.6 10*3/uL — ABNORMAL HIGH (ref 4.0–10.5)
nRBC: 0 % (ref 0.0–0.2)

## 2023-10-10 SURGERY — REPAIR, HERNIA, PARAESOPHAGEAL, ROBOT-ASSISTED
Anesthesia: General

## 2023-10-10 MED ORDER — AMLODIPINE BESYLATE 5 MG PO TABS: 5.0000 mg | ORAL_TABLET | Freq: Every day | ORAL | Status: DC

## 2023-10-10 MED ORDER — FENTANYL CITRATE (PF) 100 MCG/2ML IJ SOLN
INTRAMUSCULAR | Status: AC
  Filled 2023-10-10: qty 2

## 2023-10-10 MED ORDER — FESOTERODINE FUMARATE ER 4 MG PO TB24
4.0000 mg | ORAL_TABLET | Freq: Every day | ORAL | Status: DC
  Administered 2023-10-11: 4 mg via ORAL
  Filled 2023-10-10: qty 1

## 2023-10-10 MED ORDER — METHYLENE BLUE (ANTIDOTE) 1 % IV SOLN
INTRAVENOUS | Status: AC
  Filled 2023-10-10: qty 10

## 2023-10-10 MED ORDER — ONDANSETRON HCL 4 MG/2ML IJ SOLN: 4.0000 mg | Freq: Once | INTRAMUSCULAR | Status: DC | PRN

## 2023-10-10 MED ORDER — MORPHINE SULFATE (PF) 2 MG/ML IV SOLN: 2.0000 mg | INTRAVENOUS | Status: DC | PRN

## 2023-10-10 MED ORDER — SUCCINYLCHOLINE CHLORIDE 200 MG/10ML IV SOSY
PREFILLED_SYRINGE | INTRAVENOUS | Status: DC | PRN
  Administered 2023-10-10: 40 mg via INTRAVENOUS

## 2023-10-10 MED ORDER — ONDANSETRON HCL 4 MG/2ML IJ SOLN: 4.0000 mg | Freq: Four times a day (QID) | INTRAMUSCULAR | Status: DC | PRN

## 2023-10-10 MED ORDER — ORAL CARE MOUTH RINSE: 15.0000 mL | OROMUCOSAL | Status: DC | PRN

## 2023-10-10 MED ORDER — OXYCODONE HCL 5 MG/5ML PO SOLN: 5.0000 mg | Freq: Once | ORAL | Status: AC | PRN

## 2023-10-10 MED ORDER — CEFAZOLIN SODIUM-DEXTROSE 2-4 GM/100ML-% IV SOLN
INTRAVENOUS | Status: AC
  Filled 2023-10-10: qty 100

## 2023-10-10 MED ORDER — CHLORHEXIDINE GLUCONATE CLOTH 2 % EX PADS
6.0000 | MEDICATED_PAD | Freq: Once | CUTANEOUS | Status: AC
  Administered 2023-10-10: 6 via TOPICAL

## 2023-10-10 MED ORDER — CEFAZOLIN SODIUM-DEXTROSE 2-4 GM/100ML-% IV SOLN
2.0000 g | INTRAVENOUS | Status: AC
  Administered 2023-10-10: 2 g via INTRAVENOUS

## 2023-10-10 MED ORDER — SPIRONOLACTONE 25 MG PO TABS
50.0000 mg | ORAL_TABLET | Freq: Every day | ORAL | Status: DC
  Administered 2023-10-10 – 2023-10-11 (×2): 50 mg via ORAL

## 2023-10-10 MED ORDER — METHOCARBAMOL 1000 MG/10ML IJ SOLN: 500.0000 mg | Freq: Three times a day (TID) | INTRAMUSCULAR | Status: DC | PRN

## 2023-10-10 MED ORDER — SPIRONOLACTONE 25 MG PO TABS
ORAL_TABLET | ORAL | Status: AC
  Filled 2023-10-10: qty 1

## 2023-10-10 MED ORDER — PROCHLORPERAZINE MALEATE 10 MG PO TABS: 10.0000 mg | ORAL_TABLET | Freq: Four times a day (QID) | ORAL | Status: DC | PRN

## 2023-10-10 MED ORDER — GLYCOPYRROLATE 0.2 MG/ML IJ SOLN
INTRAMUSCULAR | Status: DC | PRN
  Administered 2023-10-10: .2 mg via INTRAVENOUS

## 2023-10-10 MED ORDER — LACTATED RINGERS IV SOLN: INTRAVENOUS | Status: DC

## 2023-10-10 MED ORDER — DEXMEDETOMIDINE HCL IN NACL 80 MCG/20ML IV SOLN
INTRAVENOUS | Status: DC | PRN
  Administered 2023-10-10 (×3): 8 ug via INTRAVENOUS

## 2023-10-10 MED ORDER — CHLORHEXIDINE GLUCONATE CLOTH 2 % EX PADS
6.0000 | MEDICATED_PAD | Freq: Once | CUTANEOUS | Status: AC
Start: 2023-10-10 — End: 2023-10-10
  Administered 2023-10-10: 6 via TOPICAL

## 2023-10-10 MED ORDER — HYDRALAZINE HCL 20 MG/ML IJ SOLN: 10.0000 mg | INTRAMUSCULAR | Status: DC | PRN

## 2023-10-10 MED ORDER — GABAPENTIN 300 MG PO CAPS
ORAL_CAPSULE | ORAL | Status: AC
  Filled 2023-10-10: qty 1

## 2023-10-10 MED ORDER — DEXAMETHASONE SODIUM PHOSPHATE 10 MG/ML IJ SOLN
INTRAMUSCULAR | Status: AC
  Filled 2023-10-10: qty 1

## 2023-10-10 MED ORDER — SUCCINYLCHOLINE CHLORIDE 200 MG/10ML IV SOSY
PREFILLED_SYRINGE | INTRAVENOUS | Status: AC
  Filled 2023-10-10: qty 10

## 2023-10-10 MED ORDER — EZETIMIBE 10 MG PO TABS
10.0000 mg | ORAL_TABLET | Freq: Every day | ORAL | Status: DC
Start: 2023-10-11 — End: 2023-10-11
  Administered 2023-10-11: 10 mg via ORAL

## 2023-10-10 MED ORDER — CEFAZOLIN SODIUM-DEXTROSE 2-4 GM/100ML-% IV SOLN
2.0000 g | Freq: Three times a day (TID) | INTRAVENOUS | Status: AC
  Administered 2023-10-10 – 2023-10-11 (×3): 2 g via INTRAVENOUS

## 2023-10-10 MED ORDER — SPIRONOLACTONE 25 MG PO TABS
ORAL_TABLET | ORAL | Status: AC
Start: 2023-10-10 — End: ?
  Filled 2023-10-10: qty 1

## 2023-10-10 MED ORDER — BUPIVACAINE LIPOSOME 1.3 % IJ SUSP
INTRAMUSCULAR | Status: AC
  Filled 2023-10-10: qty 20

## 2023-10-10 MED ORDER — METHOCARBAMOL 500 MG PO TABS: 500.0000 mg | ORAL_TABLET | Freq: Three times a day (TID) | ORAL | Status: DC | PRN

## 2023-10-10 MED ORDER — KETOROLAC TROMETHAMINE 15 MG/ML IJ SOLN
INTRAMUSCULAR | Status: AC
  Filled 2023-10-10: qty 1

## 2023-10-10 MED ORDER — PROPOFOL 10 MG/ML IV BOLUS
INTRAVENOUS | Status: AC
  Filled 2023-10-10: qty 20

## 2023-10-10 MED ORDER — PROPOFOL 10 MG/ML IV BOLUS
INTRAVENOUS | Status: DC | PRN
  Administered 2023-10-10: 100 mg via INTRAVENOUS

## 2023-10-10 MED ORDER — MELATONIN 5 MG PO TABS: 5.0000 mg | ORAL_TABLET | Freq: Every evening | ORAL | Status: DC | PRN

## 2023-10-10 MED ORDER — LIDOCAINE HCL (CARDIAC) PF 100 MG/5ML IV SOSY
PREFILLED_SYRINGE | INTRAVENOUS | Status: DC | PRN
  Administered 2023-10-10: 80 mg via INTRAVENOUS

## 2023-10-10 MED ORDER — OXYCODONE HCL 5 MG PO TABS
ORAL_TABLET | ORAL | Status: AC
  Filled 2023-10-10: qty 1

## 2023-10-10 MED ORDER — GLYCOPYRROLATE 0.2 MG/ML IJ SOLN
INTRAMUSCULAR | Status: AC
  Filled 2023-10-10: qty 1

## 2023-10-10 MED ORDER — GABAPENTIN 300 MG PO CAPS
300.0000 mg | ORAL_CAPSULE | ORAL | Status: AC
  Administered 2023-10-10: 300 mg via ORAL

## 2023-10-10 MED ORDER — ENOXAPARIN SODIUM 40 MG/0.4ML IJ SOSY
40.0000 mg | PREFILLED_SYRINGE | INTRAMUSCULAR | Status: DC
  Administered 2023-10-11: 40 mg via SUBCUTANEOUS

## 2023-10-10 MED ORDER — ORAL CARE MOUTH RINSE: 15.0000 mL | Freq: Once | OROMUCOSAL | Status: AC

## 2023-10-10 MED ORDER — DEXAMETHASONE SODIUM PHOSPHATE 10 MG/ML IJ SOLN
INTRAMUSCULAR | Status: DC | PRN
  Administered 2023-10-10: 10 mg via INTRAVENOUS

## 2023-10-10 MED ORDER — ACETAMINOPHEN 500 MG PO TABS
1000.0000 mg | ORAL_TABLET | Freq: Four times a day (QID) | ORAL | Status: DC
  Administered 2023-10-10 – 2023-10-11 (×4): 1000 mg via ORAL

## 2023-10-10 MED ORDER — PHENYLEPHRINE 80 MCG/ML (10ML) SYRINGE FOR IV PUSH (FOR BLOOD PRESSURE SUPPORT)
PREFILLED_SYRINGE | INTRAVENOUS | Status: DC | PRN
  Administered 2023-10-10: 80 ug via INTRAVENOUS
  Administered 2023-10-10: 160 ug via INTRAVENOUS
  Administered 2023-10-10 (×2): 80 ug via INTRAVENOUS

## 2023-10-10 MED ORDER — BUPIVACAINE LIPOSOME 1.3 % IJ SUSP
INTRAMUSCULAR | Status: DC | PRN
  Administered 2023-10-10: 20 mL

## 2023-10-10 MED ORDER — DIPHENHYDRAMINE HCL 12.5 MG/5ML PO ELIX: 12.5000 mg | ORAL_SOLUTION | Freq: Four times a day (QID) | ORAL | Status: DC | PRN

## 2023-10-10 MED ORDER — KETOROLAC TROMETHAMINE 15 MG/ML IJ SOLN
INTRAMUSCULAR | Status: AC
Start: 2023-10-10 — End: ?
  Filled 2023-10-10: qty 1

## 2023-10-10 MED ORDER — ROCURONIUM BROMIDE 10 MG/ML (PF) SYRINGE
PREFILLED_SYRINGE | INTRAVENOUS | Status: AC
  Filled 2023-10-10: qty 10

## 2023-10-10 MED ORDER — ACETAMINOPHEN 500 MG PO TABS
ORAL_TABLET | ORAL | Status: AC
Start: 2023-10-10 — End: ?
  Filled 2023-10-10: qty 2

## 2023-10-10 MED ORDER — PROCHLORPERAZINE EDISYLATE 10 MG/2ML IJ SOLN: 5.0000 mg | Freq: Four times a day (QID) | INTRAMUSCULAR | Status: DC | PRN

## 2023-10-10 MED ORDER — CHLORHEXIDINE GLUCONATE 0.12 % MT SOLN
OROMUCOSAL | Status: AC
  Filled 2023-10-10: qty 15

## 2023-10-10 MED ORDER — SUGAMMADEX SODIUM 200 MG/2ML IV SOLN
INTRAVENOUS | Status: DC | PRN
  Administered 2023-10-10: 150 mg via INTRAVENOUS

## 2023-10-10 MED ORDER — ACETAMINOPHEN 500 MG PO TABS
ORAL_TABLET | ORAL | Status: AC
  Filled 2023-10-10: qty 2

## 2023-10-10 MED ORDER — BUPIVACAINE-EPINEPHRINE 0.25% -1:200000 IJ SOLN
INTRAMUSCULAR | Status: DC | PRN
  Administered 2023-10-10: 30 mL

## 2023-10-10 MED ORDER — VISTASEAL 10 ML SINGLE DOSE KIT
PACK | CUTANEOUS | Status: DC | PRN
  Administered 2023-10-10: 10 mL via TOPICAL

## 2023-10-10 MED ORDER — SODIUM CHLORIDE 0.9 % IR SOLN
Status: DC | PRN
  Administered 2023-10-10: 1000 mL

## 2023-10-10 MED ORDER — VISTASEAL 10 ML SINGLE DOSE KIT
PACK | CUTANEOUS | Status: AC
  Filled 2023-10-10: qty 10

## 2023-10-10 MED ORDER — CHLORHEXIDINE GLUCONATE 0.12 % MT SOLN
15.0000 mL | Freq: Once | OROMUCOSAL | Status: AC
  Administered 2023-10-10: 15 mL via OROMUCOSAL

## 2023-10-10 MED ORDER — ONDANSETRON 4 MG PO TBDP: 4.0000 mg | ORAL_TABLET | Freq: Four times a day (QID) | ORAL | Status: DC | PRN

## 2023-10-10 MED ORDER — FENTANYL CITRATE (PF) 100 MCG/2ML IJ SOLN
INTRAMUSCULAR | Status: DC | PRN
  Administered 2023-10-10 (×2): 50 ug via INTRAVENOUS

## 2023-10-10 MED ORDER — DIPHENHYDRAMINE HCL 50 MG/ML IJ SOLN: 12.5000 mg | Freq: Four times a day (QID) | INTRAMUSCULAR | Status: DC | PRN

## 2023-10-10 MED ORDER — ROCURONIUM BROMIDE 100 MG/10ML IV SOLN
INTRAVENOUS | Status: DC | PRN
  Administered 2023-10-10: 50 mg via INTRAVENOUS
  Administered 2023-10-10 (×2): 10 mg via INTRAVENOUS

## 2023-10-10 MED ORDER — BUPIVACAINE-EPINEPHRINE (PF) 0.25% -1:200000 IJ SOLN
INTRAMUSCULAR | Status: AC
  Filled 2023-10-10: qty 30

## 2023-10-10 MED ORDER — SODIUM CHLORIDE 0.9 % IV SOLN
INTRAVENOUS | Status: AC
Start: 2023-10-10 — End: 2023-10-11

## 2023-10-10 MED ORDER — ONDANSETRON HCL 4 MG/2ML IJ SOLN
INTRAMUSCULAR | Status: DC | PRN
  Administered 2023-10-10: 4 mg via INTRAVENOUS

## 2023-10-10 MED ORDER — OXYCODONE HCL 5 MG PO TABS
5.0000 mg | ORAL_TABLET | ORAL | Status: DC | PRN
Start: 2023-10-10 — End: 2023-10-11
  Administered 2023-10-10 – 2023-10-11 (×2): 5 mg via ORAL

## 2023-10-10 MED ORDER — OXYCODONE HCL 5 MG PO TABS
5.0000 mg | ORAL_TABLET | Freq: Once | ORAL | Status: AC | PRN
  Administered 2023-10-10: 5 mg via ORAL

## 2023-10-10 MED ORDER — KETOROLAC TROMETHAMINE 15 MG/ML IJ SOLN
15.0000 mg | Freq: Four times a day (QID) | INTRAMUSCULAR | Status: DC
  Administered 2023-10-10 – 2023-10-11 (×4): 15 mg via INTRAVENOUS

## 2023-10-10 MED ORDER — PHENYLEPHRINE 80 MCG/ML (10ML) SYRINGE FOR IV PUSH (FOR BLOOD PRESSURE SUPPORT)
PREFILLED_SYRINGE | INTRAVENOUS | Status: AC
  Filled 2023-10-10: qty 10

## 2023-10-10 MED ORDER — FENTANYL CITRATE (PF) 100 MCG/2ML IJ SOLN
25.0000 ug | INTRAMUSCULAR | Status: DC | PRN
  Administered 2023-10-10 (×2): 25 ug via INTRAVENOUS

## 2023-10-10 MED ORDER — ACETAMINOPHEN 500 MG PO TABS
1000.0000 mg | ORAL_TABLET | ORAL | Status: AC
  Administered 2023-10-10: 1000 mg via ORAL

## 2023-10-10 SURGICAL SUPPLY — 54 items
APPLICATOR VISTASEAL 35 (MISCELLANEOUS) IMPLANT
CANNULA REDUCER 12-8 DVNC XI (CANNULA) ×2 IMPLANT
CLIP LIGATING HEM O LOK PURPLE (MISCELLANEOUS) IMPLANT
DERMABOND ADVANCED .7 DNX12 (GAUZE/BANDAGES/DRESSINGS) ×2 IMPLANT
DRAPE ARM DVNC X/XI (DISPOSABLE) ×8 IMPLANT
DRAPE COLUMN DVNC XI (DISPOSABLE) ×2 IMPLANT
ELECT REM PT RETURN 9FT ADLT (ELECTROSURGICAL) ×1
ELECTRODE REM PT RTRN 9FT ADLT (ELECTROSURGICAL) ×2 IMPLANT
FORCEPS BPLR R/ABLATION 8 DVNC (INSTRUMENTS) ×2 IMPLANT
GLOVE BIO SURGEON STRL SZ7 (GLOVE) ×6 IMPLANT
GOWN STRL REUS W/ TWL LRG LVL3 (GOWN DISPOSABLE) ×8 IMPLANT
GRASPER LAPSCPC 5X45 DSP (INSTRUMENTS) ×2 IMPLANT
GRASPER TIP-UP FEN DVNC XI (INSTRUMENTS) ×2 IMPLANT
IRRIGATION STRYKERFLOW (MISCELLANEOUS) IMPLANT
IRRIGATOR STRYKERFLOW (MISCELLANEOUS) ×1
IV NS 1000ML BAXH (IV SOLUTION) IMPLANT
KIT IMAGING PINPOINTPAQ (MISCELLANEOUS) ×2 IMPLANT
KIT PINK PAD W/HEAD ARE REST (MISCELLANEOUS) ×1
KIT PINK PAD W/HEAD ARM REST (MISCELLANEOUS) ×2 IMPLANT
LABEL OR SOLS (LABEL) ×2 IMPLANT
MANIFOLD NEPTUNE II (INSTRUMENTS) ×2 IMPLANT
MESH BIO-A 7X10 SYN MAT (Mesh General) IMPLANT
NDL DRIVE SUT CUT DVNC (INSTRUMENTS) ×2 IMPLANT
NDL HYPO 22X1.5 SAFETY MO (MISCELLANEOUS) ×2 IMPLANT
NEEDLE DRIVE SUT CUT DVNC (INSTRUMENTS) ×1
NEEDLE HYPO 22X1.5 SAFETY MO (MISCELLANEOUS) ×1
OBTURATOR OPTICAL STND 8 DVNC (TROCAR) ×1
OBTURATOR OPTICALSTD 8 DVNC (TROCAR) ×2 IMPLANT
PACK LAP CHOLECYSTECTOMY (MISCELLANEOUS) ×2 IMPLANT
SCISSORS METZENBAUM CVD 33 (INSTRUMENTS) IMPLANT
SEAL UNIV 5-12 XI (MISCELLANEOUS) ×8 IMPLANT
SEALER VESSEL EXT DVNC XI (MISCELLANEOUS) ×2 IMPLANT
SOL ELECTROSURG ANTI STICK (MISCELLANEOUS) ×1
SOLUTION ELECTROSURG ANTI STCK (MISCELLANEOUS) ×2 IMPLANT
SPIKE FLUID TRANSFER (MISCELLANEOUS) ×2 IMPLANT
SPONGE T-LAP 18X18 ~~LOC~~+RFID (SPONGE) ×2 IMPLANT
SUT MNCRL 4-0 27 PS-2 XMFL (SUTURE) ×2
SUT MNCRL 4-0 27XMFL (SUTURE) ×2
SUT SILK 2 0 SH (SUTURE) ×4 IMPLANT
SUT STRATA 2-0 23CM CT-2 (SUTURE) ×2 IMPLANT
SUT VIC AB 3-0 SH 27X BRD (SUTURE) IMPLANT
SUT VICRYL 0 UR6 27IN ABS (SUTURE) ×4 IMPLANT
SUTURE MNCRL 4-0 27XMF (SUTURE) ×2 IMPLANT
SYR 30ML LL (SYRINGE) ×2 IMPLANT
SYR TOOMEY IRRIG 70ML (MISCELLANEOUS) ×1
SYRINGE TOOMEY IRRIG 70ML (MISCELLANEOUS) ×2 IMPLANT
SYS BAG RETRIEVAL 10MM (BASKET) ×1
SYSTEM BAG RETRIEVAL 10MM (BASKET) IMPLANT
TRAP FLUID SMOKE EVACUATOR (MISCELLANEOUS) ×2 IMPLANT
TRAY FOLEY SLVR 16FR LF STAT (SET/KITS/TRAYS/PACK) ×2 IMPLANT
TROCAR Z-THREAD FIOS 5X100MM (TROCAR) ×2 IMPLANT
TUBING EVAC SMOKE HEATED PNEUM (TUBING) ×2 IMPLANT
WATER STERILE IRR 3000ML UROMA (IV SOLUTION) IMPLANT
WATER STERILE IRR 500ML POUR (IV SOLUTION) ×2 IMPLANT

## 2023-10-10 NOTE — Interval H&P Note (Signed)
History and Physical Interval Note:  10/10/2023 7:24 AM  Claire Kane  has presented today for surgery, with the diagnosis of paraesophageal hernia.  The various methods of treatment have been discussed with the patient and family. After consideration of risks, benefits and other options for treatment, the patient has consented to  Procedure(s): XI ROBOTIC ASSISTED PARAESOPHAGEAL HERNIA REPAIR, RNFA to assist (N/A) as a surgical intervention.  The patient's history has been reviewed, patient examined, no change in status, stable for surgery.  I have reviewed the patient's chart and labs.  Questions were answered to the patient's satisfaction.     Seibert Keeter F Audon Heymann

## 2023-10-10 NOTE — Anesthesia Procedure Notes (Signed)
Procedure Name: Intubation Date/Time: 10/10/2023 7:50 AM  Performed by: Morene Crocker, CRNAPre-anesthesia Checklist: Patient identified, Patient being monitored, Timeout performed, Emergency Drugs available and Suction available Patient Re-evaluated:Patient Re-evaluated prior to induction Oxygen Delivery Method: Circle system utilized Preoxygenation: Pre-oxygenation with 100% oxygen Induction Type: IV induction and Rapid sequence Laryngoscope Size: McGrath and 3 Grade View: Grade I Tube type: Oral Tube size: 6.5 mm Number of attempts: 1 Airway Equipment and Method: Stylet Placement Confirmation: ETT inserted through vocal cords under direct vision, positive ETCO2 and breath sounds checked- equal and bilateral Secured at: 21 cm Tube secured with: Tape Dental Injury: Teeth and Oropharynx as per pre-operative assessment  Comments: Smooth atraumatic intubation, No complications noted. Grade 1 view using McGrath X3 blade.

## 2023-10-10 NOTE — Transfer of Care (Signed)
Immediate Anesthesia Transfer of Care Note  Patient: Claire Kane  Procedure(s) Performed: XI ROBOTIC ASSISTED PARAESOPHAGEAL HERNIA REPAIR, RNFA to assist  Patient Location: PACU  Anesthesia Type:General  Level of Consciousness: drowsy  Airway & Oxygen Therapy: Patient Spontanous Breathing and Patient connected to face mask oxygen  Post-op Assessment: Report given to RN and Post -op Vital signs reviewed and stable  Post vital signs: Reviewed and stable  Last Vitals:  Vitals Value Taken Time  BP 139/63 10/10/23 1103  Temp 36.5 C 10/10/23 1103  Pulse 78 10/10/23 1106  Resp 23 10/10/23 1106  SpO2 100 % 10/10/23 1106  Vitals shown include unfiled device data.  Last Pain:  Vitals:   10/10/23 0627  TempSrc: Temporal  PainSc: 0-No pain         Complications: No notable events documented.

## 2023-10-10 NOTE — Anesthesia Postprocedure Evaluation (Signed)
Anesthesia Post Note  Patient: Claire Kane  Procedure(s) Performed: XI ROBOTIC ASSISTED PARAESOPHAGEAL HERNIA REPAIR, RNFA to assist  Patient location during evaluation: PACU Anesthesia Type: General Level of consciousness: awake and alert Pain management: pain level controlled Vital Signs Assessment: post-procedure vital signs reviewed and stable Respiratory status: spontaneous breathing, nonlabored ventilation, respiratory function stable and patient connected to nasal cannula oxygen Cardiovascular status: blood pressure returned to baseline and stable Postop Assessment: no apparent nausea or vomiting Anesthetic complications: no   No notable events documented.   Last Vitals:  Vitals:   10/10/23 1200 10/10/23 1245  BP: (!) 110/59 117/61  Pulse: 67 75  Resp: 16 11  Temp:  (!) 36.1 C  SpO2: 95% 96%    Last Pain:  Vitals:   10/10/23 1245  TempSrc:   PainSc: 7                  Corinda Gubler

## 2023-10-10 NOTE — Anesthesia Preprocedure Evaluation (Signed)
Anesthesia Evaluation  Patient identified by MRN, date of birth, ID band Patient awake  General Assessment Comment:  Hx difficult airway in the 1970s. No recent intubation records on file here. Patient states she has not been told subsequently that there was trouble placing breathing tubefs.  Reviewed: Allergy & Precautions, NPO status , Patient's Chart, lab work & pertinent test results  History of Anesthesia Complications (+) DIFFICULT AIRWAY and history of anesthetic complications  Airway Mallampati: IV  TM Distance: <3 FB Neck ROM: Full  Mouth opening: Limited Mouth Opening  Dental  (+) Partial Upper   Pulmonary neg pulmonary ROS, neg sleep apnea, neg COPD, Patient abstained from smoking.Not current smoker   Pulmonary exam normal breath sounds clear to auscultation       Cardiovascular Exercise Tolerance: Good METShypertension, Pt. on medications (-) CAD and (-) Past MI (-) dysrhythmias  Rhythm:Regular Rate:Normal - Systolic murmurs    Neuro/Psych  PSYCHIATRIC DISORDERS Anxiety Depression    negative neurological ROS     GI/Hepatic hiatal hernia,GERD  ,,(+)     (-) substance abuse    Endo/Other  neg diabetes    Renal/GU negative Renal ROS     Musculoskeletal  (+) Arthritis ,    Abdominal   Peds  Hematology  (+) Blood dyscrasia, anemia   Anesthesia Other Findings Past Medical History: No date: Anemia     Comment:  fe deficiency now improved at age 89 No date: Anxiety No date: Arthritis 08/04/2015: BRCA 1 and 2 negative 09/2020: Breast cancer, left (HCC) 06/06/2023: Carpal tunnel syndrome of right wrist No date: Cataract No date: Difficult intubation No date: Ductal carcinoma in situ (DCIS) of right breast No date: GERD (gastroesophageal reflux disease) 2001: Heart murmur No date: Hiatal hernia No date: High cholesterol No date: Hypertension 09/19/2020: Malignant tumor of breast (HCC) 06/06/2023:  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 2002, 2022: Personal history of radiation therapy     Comment:  BREAST CA No date: Rosacea No date: 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 complications  Reproductive/Obstetrics                             Anesthesia Physical Anesthesia Plan  ASA: 3  Anesthesia Plan: General   Post-op Pain Management: Tylenol PO (pre-op)* and Gabapentin PO (pre-op)*   Induction: Intravenous and Rapid sequence  PONV Risk Score and Plan: 4 or greater and Ondansetron, Dexamethasone and Treatment may vary due to age or medical condition  Airway Management Planned: Oral ETT and Video Laryngoscope Planned  Additional Equipment: None  Intra-op Plan:   Post-operative Plan: Extubation in OR  Informed Consent: I have reviewed the patients History and Physical, chart, labs and discussed the procedure including the risks, benefits and alternatives for the proposed anesthesia with the patient or authorized representative who has indicated his/her understanding and acceptance.     Dental advisory given  Plan Discussed with: CRNA and Surgeon  Anesthesia Plan Comments: (Difficult airway equipment ready in room.  Discussed risks of anesthesia with patient, including PONV, sore throat, lip/dental/eye damage. Rare risks discussed as well, such as cardiorespiratory and neurological sequelae, and allergic reactions. Discussed the role of CRNA in patient's perioperative care. Patient understands.)       Anesthesia Quick Evaluation

## 2023-10-10 NOTE — Op Note (Signed)
Robotic assisted laparoscopic repair of  paraesophageal  hernia with Bio-A Mesh and partial fundoplication Repair umbilical hernia 1 cm   Pre-operative Diagnosis: GERD, hiatal hernia   Post-operative Diagnosis: same   Robotic assisted laparoscopic repair of  paraesophageal  hernia with Bio-A Mesh and partial fundoplication Repair umbilical hernia 1 cmocedure:     Surgeon: Sterling Big, MD FACS   Assistant: .Gaston Islam, RNFA Required due to the complexity of the case the need for exposure and lack of first assist.   Anesthesia: Gen. 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 The benefits, complications, treatment options, and expected outcomes were discussed with the patient. The risks of bleeding, infection, recurrence of symptoms, failure to resolve symptoms,  esophageal damage, Dysphagia, bowel injury, any of which could require further surgery were reviewed with the patient. The likelihood of improving the patient's symptoms with return to their baseline status is good.  The patient and/or family concurred with the proposed plan, giving informed consent.  The patient was taken to Operating Room, identified  and the procedure verified.  A Time Out was held and the above information confirmed.   Prior to the induction of general anesthesia, antibiotic prophylaxis was administered. VTE prophylaxis was in place. General endotracheal anesthesia was then administered and tolerated well. After the induction, the abdomen was prepped with Chloraprep and draped in the sterile fashion. The  patient was positioned in the supine position. Supraumbilical incision created and umbilical hernia encountered, the sac was dissected circumferentially and the hernia sac was excised and the fascial edges were cleaned Cut down technique was used to enter the abdominal cavity and a Hasson trochar was placed via hernia defect after two vicryl stitches were anchored to the fascia. Pneumoperitoneum was then created with CO2 and tolerated well without any adverse changes in the patient's vital signs.  Initial two 8-mm ports were placed under direct vision.  We encountered significant adhesions that we went ahead and lysis laparoscopic sharply before docking the robot. We spent significant time restoring intra-abdominal anatomy. Enterolysis took about 30-40 minutes and added significant time to the operation, no injuries visualized.  Additional two ports were  placed to assist with retraction and exposure.    The patient was positioned  in reverse Trendelenburg, robot was brought to the surgical field and docked in the standard fashion.  We made sure all the instrumentation was kept indirect view at all times and that there were no collision between the arms. I scrubbed out and went to the console.   I used a robotic arm to retract the liver, the vessel sealer on my right hand and a forced bipolar grasper on my left hand.  There is along the extra 5 mm port allow me ample exposure and the ability to perform meticulous dissection   We Started dividing the lesser omentum via the pars flaccida.  We Were able to dissect the lesser curvature of the stomach and  dissected the fundus free from the right and left crus.  We  circumferentially dissected the GE junction.  The hernia sac was also completely reduced and we were able to bring the stomach into the intra-abdominal position.  Attention then was turned to the greater curvature where the short gastrics were divided with sealer device.  We were able to identify  the left crus and again were able to make sure there was a good circumferential dissection and that the hernia sac was completely excised, please note that we performed a meticulous dissection of the hernia sac avoiding any esophageal injuries. Vagal nerves were identified and preserved  We did perform a good dissection within the mediastinum to allow a complete reduction of the sac, gain esophageal length and a to completely allow an intra-abdominal fundoplication. Using two strips of Bio-A as pledgets we approximated the crus with a 2-0 stratafix  suture. A bio-A 10x7 cm mesh was inserted and secured using Vistaseal.  We also asked anesthesia to inject methylene blue via OG and no gastric or esophageal injuries were observed  We Asked anesthesia to place a 50 French bougie and this went easily.  We also observe trajectory of the bougie. 320 degree  fundoplication was created with multiple 2-0 silk sutures and we placed 3 stitches taking some of the esophagus within that bite.  The fundoplication measured approximately 3-1/2 cm and he was floppy. I was very happy with the way the fundoplication laid and the repair of the hernia.   Inspection of the  upper quadrant was performed. No bleeding, bile  Or esophageal injuries leaks, or bowel injuries were noted. Robotic instruments and robotic arms were undocked in the standard fashion. All the needles were removed under direct visualization.   I scrubbed back in.   Pneumoperitoneum was released.  I pared a ventral defect by placing a manage in an underlay fashion and anchoring the mesh with transfascial sutures in 4 corners under direct visualization.  I then was able to approximate the fascial defect using multiple 0 vicryl in the standard fashion. Please note that the hernia repair and enterolysis  added significant time to the operation since it required additional dissection and lysis of adhesions      4-0 subcuticular Monocryl was used to close  the skin incisions. Liposomal marcaine was injected to all the incisions sites.   Dermabond was  applied.  The patient was then extubated and brought to the recovery room in stable condition. Sponge, lap, and needle counts were correct at closure and at the conclusion of the case.          Please note that the repair of the umbilical hernia and enterolysis added significant time that is substantially greater than typically required I spent additional 45 minutes in repairing the hernia  and performing lysis adhesions

## 2023-10-10 NOTE — Plan of Care (Signed)
  Problem: Pain Managment: Goal: General experience of comfort will improve and/or be controlled Outcome: Progressing   Problem: Safety: Goal: Ability to remain free from injury will improve Outcome: Progressing   Problem: Skin Integrity: Goal: Risk for impaired skin integrity will decrease Outcome: Progressing

## 2023-10-11 ENCOUNTER — Encounter: Payer: Self-pay | Admitting: Surgery

## 2023-10-11 DIAGNOSIS — I10 Essential (primary) hypertension: Secondary | ICD-10-CM | POA: Diagnosis not present

## 2023-10-11 DIAGNOSIS — K219 Gastro-esophageal reflux disease without esophagitis: Secondary | ICD-10-CM | POA: Diagnosis not present

## 2023-10-11 DIAGNOSIS — R1310 Dysphagia, unspecified: Secondary | ICD-10-CM | POA: Diagnosis not present

## 2023-10-11 DIAGNOSIS — K449 Diaphragmatic hernia without obstruction or gangrene: Secondary | ICD-10-CM | POA: Diagnosis not present

## 2023-10-11 DIAGNOSIS — Z853 Personal history of malignant neoplasm of breast: Secondary | ICD-10-CM | POA: Diagnosis not present

## 2023-10-11 LAB — CBC
HCT: 33.8 % — ABNORMAL LOW (ref 36.0–46.0)
Hemoglobin: 11.2 g/dL — ABNORMAL LOW (ref 12.0–15.0)
MCH: 27.3 pg (ref 26.0–34.0)
MCHC: 33.1 g/dL (ref 30.0–36.0)
MCV: 82.2 fL (ref 80.0–100.0)
Platelets: 386 10*3/uL (ref 150–400)
RBC: 4.11 MIL/uL (ref 3.87–5.11)
RDW: 14 % (ref 11.5–15.5)
WBC: 15.2 10*3/uL — ABNORMAL HIGH (ref 4.0–10.5)
nRBC: 0 % (ref 0.0–0.2)

## 2023-10-11 LAB — BASIC METABOLIC PANEL
Anion gap: 10 (ref 5–15)
BUN: 11 mg/dL (ref 8–23)
CO2: 24 mmol/L (ref 22–32)
Calcium: 8.5 mg/dL — ABNORMAL LOW (ref 8.9–10.3)
Chloride: 101 mmol/L (ref 98–111)
Creatinine, Ser: 0.62 mg/dL (ref 0.44–1.00)
GFR, Estimated: >60 mL/min (ref >60)
Glucose, Bld: 103 mg/dL — ABNORMAL HIGH (ref 70–99)
Potassium: 3.2 mmol/L — ABNORMAL LOW (ref 3.5–5.1)
Sodium: 135 mmol/L (ref 135–145)

## 2023-10-11 LAB — SURGICAL PATHOLOGY

## 2023-10-11 MED ORDER — ACETAMINOPHEN 500 MG PO TABS
ORAL_TABLET | ORAL | Status: AC
  Filled 2023-10-11: qty 2

## 2023-10-11 MED ORDER — EZETIMIBE 10 MG PO TABS
ORAL_TABLET | ORAL | Status: AC
Start: 2023-10-11 — End: ?
  Filled 2023-10-11: qty 1

## 2023-10-11 MED ORDER — CEFAZOLIN SODIUM-DEXTROSE 2-4 GM/100ML-% IV SOLN
INTRAVENOUS | Status: AC
  Filled 2023-10-11: qty 100

## 2023-10-11 MED ORDER — SPIRONOLACTONE 25 MG PO TABS
ORAL_TABLET | ORAL | Status: AC
  Filled 2023-10-11: qty 2

## 2023-10-11 MED ORDER — KETOROLAC TROMETHAMINE 15 MG/ML IJ SOLN
INTRAMUSCULAR | Status: AC
  Filled 2023-10-11: qty 1

## 2023-10-11 MED ORDER — POTASSIUM CHLORIDE CRYS ER 20 MEQ PO TBCR
20.0000 meq | EXTENDED_RELEASE_TABLET | Freq: Two times a day (BID) | ORAL | Status: DC
  Administered 2023-10-11: 20 meq via ORAL

## 2023-10-11 MED ORDER — DOCUSATE SODIUM 100 MG PO CAPS
ORAL_CAPSULE | ORAL | Status: AC
Start: 2023-10-11 — End: ?
  Filled 2023-10-11: qty 1

## 2023-10-11 MED ORDER — ENOXAPARIN SODIUM 40 MG/0.4ML IJ SOSY
PREFILLED_SYRINGE | INTRAMUSCULAR | Status: AC
  Filled 2023-10-11: qty 0.4

## 2023-10-11 MED ORDER — OXYCODONE HCL 5 MG PO TABS: 5.0000 mg | ORAL_TABLET | ORAL | 0 refills | Status: DC | PRN

## 2023-10-11 MED ORDER — OXYCODONE HCL 5 MG PO TABS
ORAL_TABLET | ORAL | Status: AC
  Filled 2023-10-11: qty 1

## 2023-10-11 MED ORDER — POTASSIUM CHLORIDE CRYS ER 20 MEQ PO TBCR
EXTENDED_RELEASE_TABLET | ORAL | Status: AC
  Filled 2023-10-11: qty 1

## 2023-10-11 NOTE — Plan of Care (Signed)
  Problem: Elimination: Goal: Will not experience complications related to urinary retention Outcome: Progressing   Problem: Pain Managment: Goal: General experience of comfort will improve and/or be controlled Outcome: Progressing   Problem: Safety: Goal: Ability to remain free from injury will improve Outcome: Progressing

## 2023-10-11 NOTE — Care Management Obs Status (Signed)
MEDICARE OBSERVATION STATUS NOTIFICATION   Patient Details  Name: Claire Kane MRN: [id number] Date of Birth: 10-Feb-1941   Medicare Observation Status Notification Given:  Yes    Sherilyn Banker 10/11/2023, 10:50 AM

## 2023-10-11 NOTE — Discharge Instructions (Signed)
In addition to included general post-operative instructions,  Diet: Recommend following Nissen diet recommendations and restrictions for 4 weeks; Hand out given regarding this.   Activity: No heavy lifting >20 pounds (children, pets, laundry, garbage) or strenuous activity for 4 weeks, but light activity and walking are encouraged. Do not drive or drink alcohol if taking narcotic pain medications or having pain that might distract from driving.  Wound care: You may shower/get incision wet with soapy water and pat dry (do not rub incisions), but no baths or submerging incision underwater until follow-up.   Medications: Resume all home medications. For mild to moderate pain: acetaminophen (Tylenol) or ibuprofen/naproxen (if no kidney disease). Combining Tylenol with alcohol can substantially increase your risk of causing liver disease. Narcotic pain medications, if prescribed, can be used for severe pain, though may cause nausea, constipation, and drowsiness. Do not combine Tylenol and Percocet (or similar) within a 6 hour period as Percocet (and similar) contain(s) Tylenol. If you do not need the narcotic pain medication, you do not need to fill the prescription.  Call office [phone] / [phone]) at any time if any questions, worsening pain, fevers/chills, bleeding, drainage from incision site, or other concerns.

## 2023-10-11 NOTE — Plan of Care (Signed)
  Problem: Activity: Goal: Risk for activity intolerance will decrease Outcome: Progressing   Problem: Coping: Goal: Level of anxiety will decrease Outcome: Progressing   Problem: Elimination: Goal: Will not experience complications related to urinary retention Outcome: Progressing   

## 2023-10-11 NOTE — Progress Notes (Signed)
DISCHARGE NOTE:  Pt and daughter given discharge instructions and verbalized understanding. Pt wheeled to car by staff, daughter providing transportation home.

## 2023-10-11 NOTE — Discharge Summary (Signed)
[facility] SURGICAL DISCHARGE SUMMARY  Patient ID: Claire Kane MRN: [id number] DOB/AGE: Mar 27, 1941 83 y.o.  Admit date: 10/10/2023 Discharge date: 10/11/2023  Discharge Diagnoses Patient Active Problem List   Diagnosis Date Noted   Paraesophageal hernia with gastroesophageal reflux 10/10/2023   S/P repair of paraesophageal hernia 10/10/2023   Obstruction concurrent with and due to paraesophageal hernia 09/14/2023   Hiatal hernia with obstruction but no gangrene     Consultants None  Procedures 10/09/2022:  Robotic Assisted Laparoscopic Paraesophageal Hernia Repair Partial Fundoplication Umbilical hernia repair  HPI: Claire Kane is a 83 y.o. female with history of large paraesophageal hernia and GOO who presents to [facility] on 10/10/2023 for scheduled repair with Dr Everlene Farrier.   Hospital Course: Informed consent was obtained and documented, and patient underwent uneventful robotic assisted laparoscopic paraesophageal hernia repair and partial fundoplication and umbilical hernia repair (Dr Everlene Farrier, 10/10/2023).  Post-operatively, patient did well. Expected abdominal soreness. No trouble swallowing. Advancement of patient's diet and ambulation were well-tolerated. The remainder of patient's hospital course was essentially unremarkable, and discharge planning was initiated accordingly with patient safely able to be discharged home with appropriate discharge instructions, pain control, and outpatient follow-up after all of her questions were answered to her expressed satisfaction.   Discharge Condition: Good   Physical Examination:  Constitutional: Well appearing female, NAD Pulmonary: Normal effort, no respiratory distress Gastrointestinal: Soft, incisional soreness, non-distended, no rebound/guarding Skin: Laparoscopic incisions are CDI with dermabond, no erythema or drainage    Allergies as of 10/11/2023       Reactions   Zometa [zoledronic Acid]    Severe back  pain        Medication List     TAKE these medications    amLODipine 5 MG tablet Commonly known as: NORVASC TAKE 1 TABLET EVERY DAY   atorvastatin 10 MG tablet Commonly known as: LIPITOR TAKE 1 TABLET EVERY DAY   clotrimazole-betamethasone cream Commonly known as: Lotrisone Apply 1 Application topically 2 (two) times daily. What changed:  when to take this reasons to take this   exemestane 25 MG tablet Commonly known as: AROMASIN TAKE 1 TABLET EVERY DAY AFTER BREAKFAST   ezetimibe 10 MG tablet Commonly known as: ZETIA TAKE 1 TABLET EVERY DAY   Glucosamine 750 MG Tabs Take 1,500 mg by mouth daily.   multivitamin with minerals tablet Take 1 tablet by mouth daily.   omeprazole 40 MG capsule Commonly known as: PRILOSEC Take 1 capsule (40 mg total) by mouth daily.   oxyCODONE 5 MG immediate release tablet Commonly known as: Oxy IR/ROXICODONE Take 1 tablet (5 mg total) by mouth every 4 (four) hours as needed for breakthrough pain or severe pain (pain score 7-10).   polyethylene glycol 17 g packet Commonly known as: MiraLax Take 17 g by mouth daily.   REFRESH DRY EYE THERAPY OP Place 1 drop into both eyes 2 (two) times daily.   simethicone 80 MG chewable tablet Commonly known as: MYLICON Chew 80 mg by mouth every 6 (six) hours as needed for flatulence.   solifenacin 10 MG tablet Commonly known as: VESICARE Take 1 tablet (10 mg total) by mouth daily.   Soolantra 1 % Crea Generic drug: Ivermectin Apply 1 application  topically daily as needed (rosacea).   spironolactone 50 MG tablet Commonly known as: ALDACTONE Take 50 mg by mouth daily.   traMADol 50 MG tablet Commonly known as: ULTRAM Take 1 tablet (50 mg total) by mouth every 8 (eight) hours.  Vitron-C 65-125 MG Tabs Generic drug: Iron-Vitamin C Take 1 tablet by mouth daily.          Follow-up Information     Leafy Ro, MD. Go on 10/25/2023.   Specialty: General Surgery Why: Go to  appointment on 02/05 at 300 PM Contact information: [address] [place] [address] [phone]                  Time spent on discharge management including discussion of hospital course, clinical condition, outpatient instructions, prescriptions, and follow up with the patient and members of the medical team: >30 minutes  -- Lynden Oxford , PA-C [facility]  10/11/2023, 9:00 AM [phone] M-F: 7am - 4pm

## 2023-10-16 ENCOUNTER — Telehealth: Payer: Self-pay | Admitting: *Deleted

## 2023-10-16 NOTE — Telephone Encounter (Signed)
Per Dr Everlene Farrier the patient would need to come in to be seen. I spoke with the patient an she is unable to come in today as she does not have a ride. She thinks that she will be alright with out the refill. I reviewed with her about taking Advil 600 mg and rotating this with Tylenol 2 extra strength every 4 hours for pain control. She is amendable to this and will call with any further questions.

## 2023-10-16 NOTE — Telephone Encounter (Signed)
Patient called and wanted to see if she can get another refill of oxycodone sent to her pharmacy. [facility]  Patient had paraesophageal hernia repair on 10/10/23 Dr Everlene Farrier

## 2023-10-24 ENCOUNTER — Telehealth: Payer: Self-pay | Admitting: Gastroenterology

## 2023-10-24 NOTE — Telephone Encounter (Signed)
The patient called in to reschedule her follow up with Dr. Tobi Bastos. I offered her a few she said that she is not sure if she needs to wait that long. She wants to speak to Dr. Tobi Bastos nurse.

## 2023-10-25 ENCOUNTER — Encounter: Payer: Self-pay | Admitting: Surgery

## 2023-10-25 ENCOUNTER — Ambulatory Visit (INDEPENDENT_AMBULATORY_CARE_PROVIDER_SITE_OTHER): Payer: Medicare PPO | Admitting: Surgery

## 2023-10-25 VITALS — BP 143/74 | HR 82 | Temp 98.1°F | Ht 62.0 in | Wt 141.2 lb

## 2023-10-25 DIAGNOSIS — Z09 Encounter for follow-up examination after completed treatment for conditions other than malignant neoplasm: Secondary | ICD-10-CM

## 2023-10-25 DIAGNOSIS — K449 Diaphragmatic hernia without obstruction or gangrene: Secondary | ICD-10-CM

## 2023-10-25 NOTE — Patient Instructions (Addendum)
 Follow up here in 3 months. We will send you a letter about this appointment.  Nissen Fundoplication: Eating Plan After having a surgery called Nissen fundoplication to help stop reflux, it's common to have some trouble swallowing. The part of your body that moves food and liquid from your mouth to your stomach (esophagus) will be swollen and may feel tight. It may take a few weeks or months for your esophagus and stomach to heal. You may be told to follow an eating plan that moves slowly from having clear liquids to soft foods to a soft-normal diet. A special eating plan can help prevent problems such as: Pain. Swelling or pressure in your belly. Gas. Feeling like you may throw up. Diarrhea, which is watery poop. What are tips for following this plan? Cooking Cook all foods until they're soft. Remove skins and seeds from fruits and vegetables before you eat them. Remove skin and gristle from meats. Grind or finely mince meats before you eat them. Do not over-cook meat. Dry, tough meat is harder to swallow. Use little to no oil when you cook. Use little to no seasoning when you cook. Toast bread before you eat it. This makes it easier to swallow. Meal planning     Eat 6-8 small meals during the day to keep your stomach from stretching. Right after the surgery, have a few meals that are only clear liquids. Clear liquids include: Water. Clear fruit juice with no pulp. Chicken, beef, or vegetable broth. Gelatin. Tea or coffee without caffeine or milk. Popsicles or shaved ice. Move to a full liquid diet as told. This includes clear liquids and: Dairy and alternative milks, such as soy milk. Strained creamed soups. Ice cream or sherbet. Pudding. Supplement drinks. Yogurt. A few days after surgery, you may be able to start eating soft foods. You may need to eat soft foods for a few weeks. Do not have sweets or sweet drinks at the start of a meal. Having them may cause your stomach  to empty faster than it should. Lifestyle Always sit upright when you eat or drink. Do not lie down after you eat. Stay sitting up for 30 minutes or longer after each meal. Do not eat for 2 hours before you go to bed. Eat slowly. Take small bites and chew food well. Drink more fluids as told. Limit how much you drink at a time. With meals and snacks, have 4-8 oz (120-240 mL). This is the same as  cup-1 cup. Do not chew gum or tobacco, or drink fluids through a straw. Doing those things may cause you to swallow extra air. General information Do not drink fizzy drinks or alcohol. Avoid foods and drinks that have caffeine or chocolate in them. Avoid foods and drinks that have a lot of acid in them. These include: Citrus fruits. Tomatoes. Broccoli. Let hot soups and drinks cool before you have them. Avoid foods that cause gas. These include: Beans. Peas. Broccoli. Cabbage. If dairy milk products cause watery poop, stay away from them or eat them in small amounts. Recommended foods Fruits Any soft-cooked fruits after skins and seeds are removed. Fruit juice. Vegetables Any soft-cooked vegetables after skins and seeds are removed. Vegetable juice. Grains Cooked cereals. Dry cereals softened with liquid. Cooked pasta, rice, or other grains. Toasted bread. Bland crackers, such as soda or graham crackers. Meats and other protein foods Tender cuts of meat, poultry, or fish after bones, skin, and gristle are removed. Poached, boiled, or scrambled eggs.  Canned fish. Tofu. Creamy nut butters. Dairy Milk. Yogurt. Cottage cheese. Mild cheeses. Beverages Supplement drinks. Tea or coffee without caffeine. Sports drinks. Fats and oils Butter. Margarine. Mayonnaise. Vegetable oil. Smooth salad dressing. Sweets and desserts Plain hard candy. Marshmallows. Pudding. Ice cream. Gelatin. Sherbet. Seasoning and other foods Salt. Light seasonings. Mustard. Vinegar. The items listed above may not be  all the foods and drinks you can have. Talk with an expert in healthy eating called a dietitian to learn more. Foods to avoid Fruits Oranges. Grapefruit. Lemons. Limes. Citrus juices. Dried fruit. Crunchy, raw fruits. Vegetables Tomato sauce. Tomato juice. Broccoli. Cauliflower. Cabbage. Brussels sprouts. Crunchy, raw vegetables. Grains High-fiber or bran cereal. Cereal with nuts, dried fruit, or coconut. Sweet breads, rolls, coffee cake, or donuts. Chewy or crusty breads. Popcorn. Meats and other protein foods Beans, peas, and lentils. Tough or fatty meats. Fried meats, chicken, or fish. Fried eggs. Nuts and seeds. Crunchy nut butters. Dairy Chocolate milk. Yogurt with chunks of fruit, nuts, seeds, or coconut. Strong cheeses. Beverages Fizzy soft drinks. Alcohol. Cocoa. Hot drinks. Fats and oils Bacon fat. Lard. Sweets and desserts Chocolate. Candy with nuts, coconut, or seeds. Peppermint. Cookies. Cakes. Pie crust. Seasoning and other foods Heavy seasonings. Chili sauce. Ketchup. Barbecue sauce. Dene. Horseradish. The items listed above may not be all the foods and drinks you should avoid. Talk with a dietitian to learn more. This information is not intended to replace advice given to you by your health care provider. Make sure you discuss any questions you have with your health care provider. Document Revised: 04/27/2023 Document Reviewed: 04/27/2023 Elsevier Patient Education  2024 [employer].

## 2023-10-26 NOTE — Telephone Encounter (Signed)
 Dr. Antony Baumgartner, patient wants to see soon since she had her surgery with Dr. Antony Baumgartner. When would you like for me to schedule her at. Elvera Hamilton had offered her your first available which is at the end of March but she wants to be seen soon. Please advise.

## 2023-10-28 NOTE — Progress Notes (Signed)
 Claire Kane is 2 and half weeks out from a robotic repair of a giant paraesophageal hernia with partial fundoplication.  She is overall doing well considering her advanced age and the magnitude of her surgery.  She is tolerating diet.  She did have some postoperative pain that has improved significantly.  No fevers no chills having bowel movements.   Physical exam: No acute distress awake and alert and ambulating Abdomen: Soft, incisions healing well without evidence of infection.  No tenderness.   A/P overall doing well considering her disease process.  Expected postoperative changes.  No surgical complications.  Return to clinic in a few months.

## 2023-10-31 ENCOUNTER — Telehealth: Payer: Self-pay | Admitting: Oncology

## 2023-10-31 NOTE — Telephone Encounter (Signed)
Patient called to request iron infusions. She states at her last visit Dr. Cathie Hoops told her to call after surgery to get them set up. She is 3 weeks out from her surgery and would like to have them now. Please call [phone]

## 2023-11-01 ENCOUNTER — Other Ambulatory Visit: Payer: Self-pay

## 2023-11-01 DIAGNOSIS — D509 Iron deficiency anemia, unspecified: Secondary | ICD-10-CM

## 2023-11-06 ENCOUNTER — Encounter: Payer: Self-pay | Admitting: Oncology

## 2023-11-06 ENCOUNTER — Telehealth: Payer: Self-pay

## 2023-11-06 ENCOUNTER — Inpatient Hospital Stay: Payer: Medicare PPO | Attending: Oncology | Admitting: Oncology

## 2023-11-06 ENCOUNTER — Inpatient Hospital Stay: Payer: Medicare PPO

## 2023-11-06 VITALS — BP 135/70 | HR 72 | Temp 98.6°F | Resp 20 | Wt 139.8 lb

## 2023-11-06 DIAGNOSIS — G629 Polyneuropathy, unspecified: Secondary | ICD-10-CM

## 2023-11-06 DIAGNOSIS — D509 Iron deficiency anemia, unspecified: Secondary | ICD-10-CM

## 2023-11-06 DIAGNOSIS — D0512 Intraductal carcinoma in situ of left breast: Secondary | ICD-10-CM | POA: Diagnosis not present

## 2023-11-06 DIAGNOSIS — M8580 Other specified disorders of bone density and structure, unspecified site: Secondary | ICD-10-CM | POA: Diagnosis not present

## 2023-11-06 DIAGNOSIS — Z79811 Long term (current) use of aromatase inhibitors: Secondary | ICD-10-CM | POA: Insufficient documentation

## 2023-11-06 DIAGNOSIS — Z170 Estrogen receptor positive status [ER+]: Secondary | ICD-10-CM | POA: Insufficient documentation

## 2023-11-06 LAB — IRON AND TIBC
Iron: 55 ug/dL (ref 28–170)
Saturation Ratios: 13 % (ref 10.4–31.8)
TIBC: 423 ug/dL (ref 250–450)
UIBC: 368 ug/dL

## 2023-11-06 LAB — CBC WITH DIFFERENTIAL (CANCER CENTER ONLY)
Abs Immature Granulocytes: 0.03 10*3/uL (ref 0.00–0.07)
Basophils Absolute: 0.1 10*3/uL (ref 0.0–0.1)
Basophils Relative: 1 %
Eosinophils Absolute: 0.3 10*3/uL (ref 0.0–0.5)
Eosinophils Relative: 5 %
HCT: 34.5 % — ABNORMAL LOW (ref 36.0–46.0)
Hemoglobin: 10.9 g/dL — ABNORMAL LOW (ref 12.0–15.0)
Immature Granulocytes: 1 %
Lymphocytes Relative: 23 %
Lymphs Abs: 1.4 10*3/uL (ref 0.7–4.0)
MCH: 27.6 pg (ref 26.0–34.0)
MCHC: 31.6 g/dL (ref 30.0–36.0)
MCV: 87.3 fL (ref 80.0–100.0)
Monocytes Absolute: 0.8 10*3/uL (ref 0.1–1.0)
Monocytes Relative: 13 %
Neutro Abs: 3.4 10*3/uL (ref 1.7–7.7)
Neutrophils Relative %: 57 %
Platelet Count: 427 10*3/uL — ABNORMAL HIGH (ref 150–400)
RBC: 3.95 MIL/uL (ref 3.87–5.11)
RDW: 16.2 % — ABNORMAL HIGH (ref 11.5–15.5)
WBC Count: 6 10*3/uL (ref 4.0–10.5)
nRBC: 0 % (ref 0.0–0.2)

## 2023-11-06 LAB — FERRITIN: Ferritin: 18 ng/mL (ref 11–307)

## 2023-11-06 NOTE — Progress Notes (Signed)
 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 Currently on adjuvant endocrine therapy-Aromasin. She tolerates with mild difficulties. Continue Aromasin plan for total 5 years [till April 2027] Recommend  annual mammogram- she gets images through GYN office.   Iron deficiency anemia Lab Results  Component Value Date   HGB 10.9 (L) 11/06/2023   TIBC 423 11/06/2023   IRONPCTSAT 13 11/06/2023   FERRITIN 18 11/06/2023    Recommended IV Venofer weekly x 4 in January 2025  - she has not received any doses due to recent hernia surgery.  Hb remains decreased. Iron panel has slightly improved.  Will offer patient Venfoer weekly x 2.   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  AI has low probability causing carpel tunnel syndrome 2%, parasthenia 3%   Osteopenia DEXA 10/28/2020 10-year probability of fracture 12.5% Major Osteoporotic Fracture 2.8% Continue  vitamin D supplementation. Plan to resume Prolia today  03/09/23  DEXA showed osteopenia.   No orders of the defined types were placed in this encounter.  Follow up in April All questions were answered. The patient knows to call the clinic with any problems, questions or concerns.  [name], MD, PhD [facility] Hematology Oncology 11/06/2023   No orders of the defined types were placed in this encounter.  Follow-up in 6 months. All questions were answered. The patient knows to call the clinic with any problems, questions or concerns.  [name], MD, PhD [facility] Hematology Oncology 11/06/2023    HISTORY OF PRESENTING ILLNESS:  Claire Kane is a  83 y.o.   female with PMH listed below who was referred to me for evaluation of anemia.  Patient reports she was told that she had iron deficiency 2 years ago and she took iron supplementation for a while and was told to stop. Recently found to to have worsening of anemia. She had Upper and lower endoscopy in 2017 by Dr.Sankar # Upper endoscopy 12/07/2017 Normal examined duodenum. - Large hiatal hernia. - Erythematous mucosa in the antrum. Biopsied. - The examination was otherwise normal # Colonoscopy on 12/07/2017  Non-thrombosed external hemorrhoids found on perianal exam. - Diverticulosis in the sigmoid colon and in the descending colon. - The examination was otherwise normal on direct and retroflexion views. - No specimens collected. #  capsule study which did not reveal any active bleeding source.  # Remote history of DCIS right breast in  2002, s/p surgery and RT. Finished 5 years of tamoxifen.   # April - May s/p upper and lower endoscopy, capsule study which did not reveal active bleeding source  Celiac panel positive.  # iron deficiency anemia.  Previously she has received IV Venofer. Patient has previously underwent extensive gastroenterology work-up including EGD, colonoscopy, capsule study which did not reveal active bleeding source.  #08/18/2020, screening mammogram showed left breast calcification. 09/07/2020, diagnostic left mammogram showed an area of 1.4 cm group of calcifications in the upper outer left breast.  Patient underwent breast biopsy, pathology came back positive for high-grade DCIS, focal necrosis, associated with calcifications.  ER status deferred to existing specimen.  # #10/07/2020, status post left DCIS lumpectomy by Dr. Lemar Livings Pathology showed residual high-grade DCIS with associated  calcifications.  Extent of DCIS at least 12 mm, grade 3, central necrosis, anterior and inferior margins were unifocal in positive for DCIS pTis, ER positive.  11/09/2020 reexcision  showed no residual DCIS or invasive carcinoma. 01/04/2021, adjuvant breast radiation  Patient reports family history of VTE daughter who was tested positive for mutation. Significant family history of cardiovascular disease, familiar hypercholesterolemia. Parents, sister, brother passed away from heart attack.  Patient declined genetic testing.  01/08/2021, started on Arimidex.  09/01/2021, switched to Aromasin due to arthralgia.    08/23/22 bilateral diagnostic mammogram - Now identified near the LEFT lumpectomy site are small round calcifications in a somewhat linear orientation spanning a distance of 1 cm. Tissue sampling is recommended. 09/07/22 left breast stereotactic core needle biopsy-benign mammary parenchyma with fibrocystic and fibroadenomatoid changes and focal usual  ductal hyperplasia. No definite evidence of residual atypical proliferative breast disease.    Interval History 83 year old female with pertinent hematology history listed above reviewed by me today presents to reestablish care for left breast DCIS.   Patient takes Aromasin 25mg  daily.   + recent admission due to hernia repair.  She requests a visit for further discussion of IV Venofer treatments.  She did not get Venofer treatments recommended back in January due to surgery.  She is taking oral iron supplementation.       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   Personal history  of radiation therapy 2002, 2022   BREAST CA   Rosacea    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 complications     SURGICAL HISTORY: Past Surgical History:  Procedure Laterality Date   ABDOMINAL HYSTERECTOMY     BLEPHAROPLASTY     BREAST BIOPSY Left 09/15/2020   affirm bx, x marker, DCIS   BREAST BIOPSY Left 09/07/2022   Left Breast Stereo Bx X clip- path pending   BREAST BIOPSY Left 09/07/2022   MM LT BREAST BX W LOC DEV 1ST LESION IMAGE BX SPEC STEREO GUIDE 09/07/2022 [facility]   BREAST BIOPSY Left 09/21/2023   u/s bx ribbon clip, path pending   BREAST BIOPSY Left 09/21/2023   [place] LT BREAST BX W LOC DEV 1ST LESION IMG BX SPEC US GUIDE 09/21/2023 [facility]   BREAST CYST ASPIRATION Left 2003   BREAST EXCISIONAL BIOPSY Right 2002   POS   BREAST LUMPECTOMY Right 2002   w/ radiation   BREAST LUMPECTOMY Left 10/07/2020   RESIDUAL HIGH-GRADE DUCTAL CARCINOMA IN SITU (DCIS) DCIS is focally present 1.4 mm to the superior margin.   BREAST LUMPECTOMY Left    re-excision   BREAST LUMPECTOMY WITH NEEDLE LOCALIZATION Left 10/07/2020   Procedure: BREAST LUMPECTOMY WITH NEEDLE LOCALIZATION;  Surgeon: Earline Mayotte, MD;  Location: [facility];  Service: General;  Laterality: Left;   BREAST SURGERY Right 2002   lumpectomy   CATARACT EXTRACTION  W/ INTRAOCULAR LENS  IMPLANT, BILATERAL     CHOLECYSTECTOMY     COLONOSCOPY  2013   COLONOSCOPY WITH PROPOFOL N/A 12/08/2015   Procedure: COLONOSCOPY WITH PROPOFOL;  Surgeon: Kieth Brightly, MD;  Location: [facility] END[facility]COPY;  Service: Endoscopy;  Laterality: N/A;   COLONOSCOPY WITH PROPOFOL N/A 12/06/2017   Procedure: COLONOSCOPY WITH PROPOFOL;  Surgeon: Wyline Mood, MD;  Location: [facility] ENDOSCOPY;  Service: Gastroenterology;  Laterality: N/A;   COLONOSCOPY WITH PROPOFOL N/A 12/22/2017   Procedure: COLONOSCOPY WITH PROPOFOL;  Surgeon: Wyline Mood, MD;  Location: [facility] ENDOSCOPY;  Service: Gastroenterology;  Laterality: N/A;    ESOPHAGOGASTRODUODENOSCOPY (EGD) WITH PROPOFOL N/A 12/08/2015   Procedure: ESOPHAGOGASTRODUODENOSCOPY (EGD) WITH PROPOFOL;  Surgeon: Kieth Brightly, MD;  Location: [facility] ENDOSCOPY;  Service: Endoscopy;  Laterality: N/A;   ESOPHAGOGASTRODUODENOSCOPY (EGD) WITH PROPOFOL N/A 12/06/2017   Procedure: ESOPHAGOGASTRODUODENOSCOPY (EGD) WITH PROPOFOL;  Surgeon: Wyline Mood, MD;  Location: [facility] ENDOSCOPY;  Service: Gastroenterology;  Laterality: N/A;   ESOPHAGOGASTRODUODENOSCOPY (EGD) WITH PROPOFOL N/A 12/22/2017   Procedure: ESOPHAGOGASTRODUODENOSCOPY (EGD) WITH PROPOFOL;  Surgeon: Wyline Mood, MD;  Location: [facility] ENDOSCOPY;  Service: Gastroenterology;  Laterality: N/A;   ESOPHAGOGASTRODUODENOSCOPY (EGD) WITH PROPOFOL N/A 08/30/2023   Procedure: ESOPHAGOGASTRODUODENOSCOPY (EGD) WITH PROPOFOL;  Surgeon: Wyline Mood, MD;  Location: [facility] ENDOSCOPY;  Service: Gastroenterology;  Laterality: N/A;   EYE SURGERY Right 2014   EYE SURGERY Left 2014   GIVENS CAPSULE STUDY N/A 01/23/2018   Procedure: GIVENS CAPSULE STUDY;  Surgeon: Wyline Mood, MD;  Location: [facility] ENDOSCOPY;  Service: Gastroenterology;  Laterality: N/A;   iron infusion     had 5 iron infusions for anemia   post radiation therapy Right    right breast cancer 2002   RE-EXCISION OF BREAST CANCER,SUPERIOR MARGINS Left 11/09/2020   Procedure: RE-EXCISION OF BREAST CANCER,SUPERIOR MARGINS;  Surgeon: Earline Mayotte, MD;  Location: [facility];  Service: General;  Laterality: Left;   Stab pheblectomy   2010   vein closure procedure Bilateral 2008   XI ROBOTIC ASSISTED PARAESOPHAGEAL HERNIA REPAIR N/A 10/10/2023   Procedure: XI ROBOTIC ASSISTED PARAESOPHAGEAL HERNIA REPAIR, RNFA to assist;  Surgeon: Leafy Ro, MD;  Location: [facility];  Service: General;  Laterality: N/A;    SOCIAL HISTORY: Social History   Socioeconomic History   Marital status: Married    Spouse name: Peyton Najjar   Number of children: 2   Years of education: Not on file    Highest education level: Bachelor's degree (e.g., BA, AB, BS)  Occupational History   Occupation: Runner, broadcasting/film/video    Comment: retired  Tobacco Use   Smoking status: Never    Passive exposure: Never   Smokeless tobacco: Never  Vaping Use   Vaping status: Never Used  Substance and Sexual Activity   Alcohol use: No   Drug use: No   Sexual activity: Never  Other Topics Concern   Not on file  Social History Narrative   Patient lives with husband. She feels safe in her home.   Social Drivers of Corporate investment banker Strain: Low Risk  (02/01/2023)   Overall Financial Resource Strain (CARDIA)    Difficulty of Paying Living Expenses: Not hard at all  Food Insecurity: No Food Insecurity (10/10/2023)   Hunger Vital Sign    Worried About Running Out of Food in the Last Year: Never true    Ran Out of Food in the Last Year: Never true  Transportation Needs: No Transportation Needs (10/10/2023)   PRAPARE - Transportation    Lack  of Transportation (Medical): No    Lack of Transportation (Non-Medical): No  Physical Activity: Inactive (02/01/2023)   Exercise Vital Sign    Days of Exercise per Week: 0 days    Minutes of Exercise per Session: 0 min  Stress: No Stress Concern Present (02/01/2023)   [employer] of Occupational Health - Occupational Stress Questionnaire    Feeling of Stress : Not at all  Social Connections: Moderately Isolated (10/10/2023)   Social Connection and Isolation Panel [NHANES]    Frequency of Communication with Friends and Family: More than three times a week    Frequency of Social Gatherings with Friends and Family: Once a week    Attends Religious Services: Never    Database administrator or Organizations: No    Attends Banker Meetings: Never    Marital Status: Married  Catering manager Violence: Not At Risk (10/10/2023)   Humiliation, Afraid, Rape, and Kick questionnaire    Fear of Current or Ex-Partner: No    Emotionally Abused: No    Physically  Abused: No    Sexually Abused: No    FAMILY HISTORY: Family History  Problem Relation Age of Onset   Heart attack Mother    Heart attack Father    Heart attack Sister    Stroke Sister    Heart attack Brother    Other Brother        cerebral hemorrhage, sepsis   Breast cancer Cousin 50       breast/maternal   Cancer Cousin 60       breast/maternal   Breast cancer Cousin    Breast cancer Maternal Aunt 27   Breast cancer Other 48       maternal neice    ALLERGIES:  is allergic to zometa [zoledronic acid].  MEDICATIONS:  Current Outpatient Medications  Medication Sig Dispense Refill   amLODipine (NORVASC) 5 MG tablet TAKE 1 TABLET EVERY DAY 90 tablet 3   atorvastatin (LIPITOR) 10 MG tablet TAKE 1 TABLET EVERY DAY 90 tablet 3   clotrimazole-betamethasone (LOTRISONE) cream Apply 1 Application topically 2 (two) times daily. (Patient taking differently: Apply 1 Application topically 2 (two) times daily as needed.) 30 g 6   exemestane (AROMASIN) 25 MG tablet TAKE 1 TABLET EVERY DAY AFTER BREAKFAST 90 tablet 3   ezetimibe (ZETIA) 10 MG tablet TAKE 1 TABLET EVERY DAY 90 tablet 3   Glucosamine 750 MG TABS Take 1,500 mg by mouth daily.     Glycerin-Polysorbate 80 (REFRESH DRY EYE THERAPY OP) Place 1 drop into both eyes 2 (two) times daily.     Iron-Vitamin C (VITRON-C) 65-125 MG TABS Take 1 tablet by mouth daily. 270 tablet 0   Multiple Vitamins-Minerals (MULTIVITAMIN WITH MINERALS) tablet Take 1 tablet by mouth daily.     omeprazole (PRILOSEC) 40 MG capsule Take 1 capsule (40 mg total) by mouth daily. 90 capsule 3   polyethylene glycol (MIRALAX) 17 g packet Take 17 g by mouth daily. 30 each 0   simethicone (MYLICON) 80 MG chewable tablet Chew 80 mg by mouth every 6 (six) hours as needed for flatulence.     solifenacin (VESICARE) 10 MG tablet Take 1 tablet (10 mg total) by mouth daily. 90 tablet 3   SOOLANTRA 1 % CREA Apply 1 application  topically daily as needed (rosacea).      spironolactone (ALDACTONE) 50 MG tablet Take 50 mg by mouth daily.     traMADol (ULTRAM) 50 MG tablet Take 1 tablet (  50 mg total) by mouth every 8 (eight) hours. 90 tablet 0   No current facility-administered medications for this visit.   Facility-Administered Medications Ordered in Other Visits  Medication Dose Route Frequency Provider Last Rate Last Admin   0.9 %  sodium chloride infusion   Intravenous Once [name], MD       Review of Systems  Constitutional:  Positive for fatigue. Negative for appetite change, chills and fever.  HENT:   Negative for hearing loss and voice change.   Eyes:  Negative for eye problems.  Respiratory:  Negative for chest tightness and cough.   Cardiovascular:  Negative for chest pain.  Gastrointestinal:  Negative for abdominal pain and blood in stool.  Endocrine: Negative for hot flashes.  Genitourinary:  Negative for difficulty urinating and frequency.   Musculoskeletal:  Negative for arthralgias.  Skin:  Negative for itching and rash.  Neurological:  Negative for extremity weakness.  Hematological:  Negative for adenopathy.  Psychiatric/Behavioral:  Negative for confusion.      PHYSICAL EXAMINATION: ECOG PERFORMANCE STATUS: 1 - Symptomatic but completely ambulatory Vitals:   11/06/23 1024  BP: 135/70  Pulse: 72  Resp: 20  Temp: 98.6 F (37 C)  SpO2: 100%   Filed Weights   11/06/23 1024  Weight: 139 lb 12.8 oz (63.4 kg)    Physical Exam Constitutional:      General: She is not in acute distress.    Appearance: She is not diaphoretic.     Comments: Obese  HENT:     Head: Normocephalic and atraumatic.  Eyes:     General: No scleral icterus. Cardiovascular:     Rate and Rhythm: Normal rate.  Pulmonary:     Effort: Pulmonary effort is normal. No respiratory distress.     Breath sounds: Normal breath sounds.  Abdominal:     General: Bowel sounds are normal. There is no distension.     Palpations: Abdomen is soft.  Musculoskeletal:         General: Normal range of motion.     Cervical back: Normal range of motion and neck supple.  Lymphadenopathy:     Cervical: No cervical adenopathy.  Skin:    General: Skin is warm and dry.  Neurological:     Mental Status: She is alert and oriented to person, place, and time. Mental status is at baseline.     Cranial Nerves: No cranial nerve deficit.     Motor: No abnormal muscle tone.  Psychiatric:        Mood and Affect: Affect normal.        Cognition and Memory: Memory normal.        Judgment: Judgment normal.      LABORATORY DATA:  I have reviewed the data as listed    Latest Ref Rng & Units 11/06/2023   10:12 AM 10/11/2023    6:43 AM 10/10/2023    2:35 PM  CBC  WBC 4.0 - 10.5 K/uL 6.0  15.2  14.6   Hemoglobin 12.0 - 15.0 g/dL 82.9  56.2  13.0   Hematocrit 36.0 - 46.0 % 34.5  33.8  34.1   Platelets 150 - 400 K/uL 427  386  378       Latest Ref Rng & Units 10/11/2023    6:43 AM 10/10/2023    2:35 PM 09/27/2023   12:43 PM  CMP  Glucose 70 - 99 mg/dL 865   784   BUN 8 - 23 mg/dL 11  14   Creatinine 0.44 - 1.00 mg/dL 4.09  8.11  9.14   Sodium 135 - 145 mmol/L 135   135   Potassium 3.5 - 5.1 mmol/L 3.2   4.4   Chloride 98 - 111 mmol/L 101   102   CO2 22 - 32 mmol/L 24   24   Calcium 8.9 - 10.3 mg/dL 8.5   9.2   Total Protein 6.5 - 8.1 g/dL   6.6   Total Bilirubin 0.0 - 1.2 mg/dL   1.0   Alkaline Phos 38 - 126 U/L   48   AST 15 - 41 U/L   21   ALT 0 - 44 U/L   19

## 2023-11-06 NOTE — Assessment & Plan Note (Addendum)
#  Left breast DCIS high grade. pTis, ER positive 2022 Status post left lumpectomy-margin positive status post reexcision achieve negative margin followed by adjuvant breast radiation. Currently on adjuvant endocrine therapy-Aromasin. She tolerates with mild difficulties. Continue Aromasin plan for total 5 years [till April 2027] Recommend  annual mammogram- she gets images through GYN office.

## 2023-11-06 NOTE — Assessment & Plan Note (Signed)
 Has been started on gabapentin.  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

## 2023-11-06 NOTE — Assessment & Plan Note (Signed)
 DEXA 10/28/2020 10-year probability of fracture 12.5% Major Osteoporotic Fracture 2.8% Continue  vitamin D supplementation. Plan to resume Prolia today  03/09/23  DEXA showed osteopenia.

## 2023-11-06 NOTE — Telephone Encounter (Signed)
 Spoke to pt, she is agreeable to IV venofer. Please schedule venofer weekly x2.

## 2023-11-06 NOTE — Assessment & Plan Note (Addendum)
 Lab Results  Component Value Date   HGB 10.9 (L) 11/06/2023   TIBC 423 11/06/2023   IRONPCTSAT 13 11/06/2023   FERRITIN 18 11/06/2023    Recommended IV Venofer weekly x 4 in January 2025  - she has not received any doses due to recent hernia surgery.  Hb remains decreased. Iron panel has slightly improved.  Will offer patient Venfoer weekly x 2.

## 2023-11-06 NOTE — Telephone Encounter (Signed)
-----   Message from [name] sent at 11/06/2023 12:53 PM EST ----- Please let patient know that her iron level has slightly improved but she remains anemic. I will offer her to get IV Venofer weekly x 2. If she agrees please arrange.  Same follow up plan.

## 2023-11-08 NOTE — Telephone Encounter (Signed)
 Dr. Tobi Bastos, can you please let me know when to schedule patient an appointment since you didn't answer me prior. You are so full that I don't know where to place her and the patient didn't want to wait so long to be seen by you.

## 2023-11-10 NOTE — Telephone Encounter (Signed)
 I called and schedule her with Inetta Fermo for November 22, 2023 at 9:45 am.

## 2023-11-10 NOTE — Telephone Encounter (Signed)
 Deshana, can you please offer her an appointment to be seen by Dr. Tobi Bastos. But there's no urgency. Thank you.

## 2023-11-13 ENCOUNTER — Inpatient Hospital Stay: Payer: Medicare PPO

## 2023-11-13 VITALS — BP 110/65 | HR 64 | Temp 97.6°F | Resp 18

## 2023-11-13 DIAGNOSIS — D0512 Intraductal carcinoma in situ of left breast: Secondary | ICD-10-CM | POA: Diagnosis not present

## 2023-11-13 DIAGNOSIS — D509 Iron deficiency anemia, unspecified: Secondary | ICD-10-CM | POA: Diagnosis not present

## 2023-11-13 DIAGNOSIS — M8580 Other specified disorders of bone density and structure, unspecified site: Secondary | ICD-10-CM

## 2023-11-13 DIAGNOSIS — G629 Polyneuropathy, unspecified: Secondary | ICD-10-CM | POA: Diagnosis not present

## 2023-11-13 DIAGNOSIS — Z170 Estrogen receptor positive status [ER+]: Secondary | ICD-10-CM | POA: Diagnosis not present

## 2023-11-13 DIAGNOSIS — Z79811 Long term (current) use of aromatase inhibitors: Secondary | ICD-10-CM | POA: Diagnosis not present

## 2023-11-13 MED ORDER — SODIUM CHLORIDE 0.9% FLUSH
10.0000 mL | Freq: Once | INTRAVENOUS | Status: AC | PRN
  Administered 2023-11-13: 10 mL
  Filled 2023-11-13: qty 10

## 2023-11-13 MED ORDER — IRON SUCROSE 20 MG/ML IV SOLN
200.0000 mg | Freq: Once | INTRAVENOUS | Status: AC
  Administered 2023-11-13: 200 mg via INTRAVENOUS

## 2023-11-14 ENCOUNTER — Telehealth: Payer: Self-pay | Admitting: *Deleted

## 2023-11-14 NOTE — Telephone Encounter (Signed)
 She asks to speak to the Roy team about a question. The patient thinks she was going to have a prolia but she does not think the got it. I looked in the last infusion and it was just Iv iron. I see a cancelled prolia for April. She wants if she need one or not. She did come in one day and she thinks that she forgot about and left without having the shot

## 2023-11-14 NOTE — Telephone Encounter (Signed)
 Pt has not had prolia since Dec 2023 ,please advise if she needs

## 2023-11-14 NOTE — Telephone Encounter (Signed)
 Spoke to pt who states that all dental work has been done.   Informed her that she can resume Problia inj.   Please add lab and Prolia inj to app ton 3/3. Informed pt to be here around 1p for labs and that she will get prolia when she gets iron infusion. Pt verbalized understanding.

## 2023-11-20 ENCOUNTER — Encounter: Payer: Self-pay | Admitting: Gastroenterology

## 2023-11-20 ENCOUNTER — Inpatient Hospital Stay: Payer: Medicare PPO | Attending: Oncology

## 2023-11-20 ENCOUNTER — Inpatient Hospital Stay: Payer: Medicare PPO

## 2023-11-20 VITALS — BP 109/60 | HR 62 | Temp 96.6°F | Resp 16

## 2023-11-20 DIAGNOSIS — M8580 Other specified disorders of bone density and structure, unspecified site: Secondary | ICD-10-CM | POA: Insufficient documentation

## 2023-11-20 DIAGNOSIS — D509 Iron deficiency anemia, unspecified: Secondary | ICD-10-CM

## 2023-11-20 LAB — CBC WITH DIFFERENTIAL (CANCER CENTER ONLY)
Abs Immature Granulocytes: 0.03 10*3/uL (ref 0.00–0.07)
Basophils Absolute: 0.1 10*3/uL (ref 0.0–0.1)
Basophils Relative: 1 %
Eosinophils Absolute: 0.2 10*3/uL (ref 0.0–0.5)
Eosinophils Relative: 3 %
HCT: 36.5 % (ref 36.0–46.0)
Hemoglobin: 11.5 g/dL — ABNORMAL LOW (ref 12.0–15.0)
Immature Granulocytes: 1 %
Lymphocytes Relative: 24 %
Lymphs Abs: 1.5 10*3/uL (ref 0.7–4.0)
MCH: 27.8 pg (ref 26.0–34.0)
MCHC: 31.5 g/dL (ref 30.0–36.0)
MCV: 88.2 fL (ref 80.0–100.0)
Monocytes Absolute: 0.7 10*3/uL (ref 0.1–1.0)
Monocytes Relative: 11 %
Neutro Abs: 4 10*3/uL (ref 1.7–7.7)
Neutrophils Relative %: 60 %
Platelet Count: 395 10*3/uL (ref 150–400)
RBC: 4.14 MIL/uL (ref 3.87–5.11)
RDW: 16.4 % — ABNORMAL HIGH (ref 11.5–15.5)
WBC Count: 6.5 10*3/uL (ref 4.0–10.5)
nRBC: 0 % (ref 0.0–0.2)

## 2023-11-20 LAB — CMP (CANCER CENTER ONLY)
ALT: 47 U/L — ABNORMAL HIGH (ref 0–44)
AST: 53 U/L — ABNORMAL HIGH (ref 15–41)
Albumin: 4.1 g/dL (ref 3.5–5.0)
Alkaline Phosphatase: 63 U/L (ref 38–126)
Anion gap: 10 (ref 5–15)
BUN: 12 mg/dL (ref 8–23)
CO2: 26 mmol/L (ref 22–32)
Calcium: 8.9 mg/dL (ref 8.9–10.3)
Chloride: 102 mmol/L (ref 98–111)
Creatinine: 0.64 mg/dL (ref 0.44–1.00)
GFR, Estimated: >60 mL/min (ref >60)
Glucose, Bld: 95 mg/dL (ref 70–99)
Potassium: 4.2 mmol/L (ref 3.5–5.1)
Sodium: 138 mmol/L (ref 135–145)
Total Bilirubin: 1 mg/dL (ref 0.0–1.2)
Total Protein: 6.8 g/dL (ref 6.5–8.1)

## 2023-11-20 LAB — RETIC PANEL
Immature Retic Fract: 6.3 % (ref 2.3–15.9)
RBC.: 4.09 MIL/uL (ref 3.87–5.11)
Retic Count, Absolute: 45 10*3/uL (ref 19.0–186.0)
Retic Ct Pct: 1.1 % (ref 0.4–3.1)
Reticulocyte Hemoglobin: 32.2 pg (ref >27.9)

## 2023-11-20 LAB — FERRITIN: Ferritin: 88 ng/mL (ref 11–307)

## 2023-11-20 LAB — IRON AND TIBC
Iron: 76 ug/dL (ref 28–170)
Saturation Ratios: 18 % (ref 10.4–31.8)
TIBC: 431 ug/dL (ref 250–450)
UIBC: 355 ug/dL

## 2023-11-20 MED ORDER — SODIUM CHLORIDE 0.9% FLUSH
10.0000 mL | Freq: Once | INTRAVENOUS | Status: AC | PRN
  Administered 2023-11-20: 10 mL
  Filled 2023-11-20: qty 10

## 2023-11-20 MED ORDER — DENOSUMAB 60 MG/ML ~~LOC~~ SOSY
60.0000 mg | PREFILLED_SYRINGE | Freq: Once | SUBCUTANEOUS | Status: AC
  Administered 2023-11-20: 60 mg via SUBCUTANEOUS
  Filled 2023-11-20: qty 1

## 2023-11-20 MED ORDER — IRON SUCROSE 20 MG/ML IV SOLN
200.0000 mg | Freq: Once | INTRAVENOUS | Status: AC
Start: 2023-11-20 — End: 2023-11-20
  Administered 2023-11-20: 200 mg via INTRAVENOUS
  Filled 2023-11-20: qty 10

## 2023-11-20 NOTE — Patient Instructions (Signed)

## 2023-11-21 NOTE — Progress Notes (Unsigned)
 Celso Amy, PA-C [address]  [address]  [place], [place] [address]  Main: [phone]  Fax: [phone]   Primary Care Physician: Ronnald Ramp, MD  Primary Gastroenterologist:  Celso Amy, PA-C / Dr. Wyline Mood    CC: Follow-up nausea and vomiting, Paraesophageal Hernia, IDA  HPI: Claire Kane is a 83 y.o. female returns for 41-month follow-up of nausea, vomiting, large paraesophageal hiatal hernia, constipation, and chronic iron deficiency anemia. She was started on MiraLAX to treat constipation, and Rx omeprazole 40 Mg once daily to treat GERD.    08/30/2023 EGD by Dr. Tobi Bastos: Large hiatal hernia, otherwise normal esophagus and duodenum.  Normal stomach.  No biopsies.  09/2023 labs: Low hemoglobin 10.3, total iron 24, iron saturation 5%, ferritin 5. She received IV iron x 2 through hematologist Dr. Cathie Hoops. 11/20/2023 labs: Improved hemoglobin 11.5, normal iron 76, iron saturation 18, ferritin 88. She has follow-up OV with Dr. Cathie Hoops in April.  Energy has improved.  She saw Dr. Everlene Farrier and underwent hiatal hernia repair surgery 10/10/2023.  Prior cholecystectomy many years ago.  Last colonoscopy 12/2017 (to evaluate iron deficiency anemia) showed nonbleeding internal hemorrhoids, good prep, no polyps.  Current Symptoms: Currently she is feeling a lot better since she underwent Nissen application hiatal hernia repair.  She has not had any more episodes of nausea or vomiting.  Denies abdominal pain, heartburn, melena, or hematochezia.  Constipation improved on MiraLAX as needed.  She has no GI concerns today.  Current Outpatient Medications  Medication Sig Dispense Refill   amLODipine (NORVASC) 5 MG tablet TAKE 1 TABLET EVERY DAY 90 tablet 3   atorvastatin (LIPITOR) 10 MG tablet TAKE 1 TABLET EVERY DAY 90 tablet 3   clotrimazole-betamethasone (LOTRISONE) cream Apply 1 Application topically 2 (two) times daily. (Patient taking differently: Apply 1 Application topically 2  (two) times daily as needed.) 30 g 6   exemestane (AROMASIN) 25 MG tablet TAKE 1 TABLET EVERY DAY AFTER BREAKFAST 90 tablet 3   ezetimibe (ZETIA) 10 MG tablet TAKE 1 TABLET EVERY DAY 90 tablet 3   Glucosamine 750 MG TABS Take 1,500 mg by mouth daily.     Glycerin-Polysorbate 80 (REFRESH DRY EYE THERAPY OP) Place 1 drop into both eyes 2 (two) times daily.     Iron-Vitamin C (VITRON-C) 65-125 MG TABS Take 1 tablet by mouth daily. 270 tablet 0   Multiple Vitamins-Minerals (MULTIVITAMIN WITH MINERALS) tablet Take 1 tablet by mouth daily.     omeprazole (PRILOSEC) 40 MG capsule Take 1 capsule (40 mg total) by mouth daily. 90 capsule 3   polyethylene glycol (MIRALAX) 17 g packet Take 17 g by mouth daily. 30 each 0   simethicone (MYLICON) 80 MG chewable tablet Chew 80 mg by mouth every 6 (six) hours as needed for flatulence.     solifenacin (VESICARE) 10 MG tablet Take 1 tablet (10 mg total) by mouth daily. 90 tablet 3   SOOLANTRA 1 % CREA Apply 1 application  topically daily as needed (rosacea).     spironolactone (ALDACTONE) 50 MG tablet Take 50 mg by mouth daily.     traMADol (ULTRAM) 50 MG tablet Take 1 tablet (50 mg total) by mouth every 8 (eight) hours. 90 tablet 0   No current facility-administered medications for this visit.   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/2023   Cataract    Difficult intubation    Ductal carcinoma in situ (DCIS) of right breast    GERD (gastroesophageal  reflux disease)    Heart murmur 2001   Hiatal hernia    High cholesterol    Hypertension    Malignant tumor of breast (HCC) 09/19/2020   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   Personal history of radiation therapy 2002, 2022   BREAST CA   Rosacea    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 complications     Past Surgical History:  Procedure Laterality Date   ABDOMINAL HYSTERECTOMY     BLEPHAROPLASTY     BREAST BIOPSY Left 09/15/2020   affirm bx, x marker, DCIS   BREAST BIOPSY Left 09/07/2022   Left Breast Stereo Bx X clip- path pending   BREAST BIOPSY Left 09/07/2022   MM LT BREAST BX W LOC DEV 1ST LESION IMAGE BX SPEC STEREO GUIDE 09/07/2022 [facility]   BREAST BIOPSY Left 09/21/2023   u/s bx ribbon clip, path pending   BREAST BIOPSY Left 09/21/2023   [place] LT BREAST BX W LOC DEV 1ST LESION IMG BX SPEC US GUIDE 09/21/2023 [facility]   BREAST CYST ASPIRATION Left 2003   BREAST EXCISIONAL BIOPSY Right 2002   POS   BREAST LUMPECTOMY Right 2002   w/ radiation   BREAST LUMPECTOMY Left 10/07/2020   RESIDUAL HIGH-GRADE DUCTAL CARCINOMA IN SITU (DCIS) DCIS is focally present 1.4 mm to the superior margin.   BREAST LUMPECTOMY Left    re-excision   BREAST LUMPECTOMY WITH NEEDLE LOCALIZATION Left 10/07/2020   Procedure: BREAST LUMPECTOMY WITH NEEDLE LOCALIZATION;  Surgeon: Earline Mayotte, MD;  Location: [facility];  Service: General;  Laterality: Left;   BREAST SURGERY Right 2002   lumpectomy   CATARACT EXTRACTION W/ INTRAOCULAR LENS  IMPLANT, BILATERAL     CHOLECYSTECTOMY     COLONOSCOPY  2013   COLONOSCOPY WITH PROPOFOL N/A 12/08/2015   Procedure: COLONOSCOPY WITH PROPOFOL;  Surgeon: Kieth Brightly, MD;  Location: [facility];  Service: Endoscopy;  Laterality: N/A;   COLONOSCOPY WITH PROPOFOL N/A 12/06/2017   Procedure: COLONOSCOPY WITH PROPOFOL;  Surgeon: Wyline Mood, MD;  Location: [facility] ENDOSCOPY;  Service: Gastroenterology;   Laterality: N/A;   COLONOSCOPY WITH PROPOFOL N/A 12/22/2017   Procedure: COLONOSCOPY WITH PROPOFOL;  Surgeon: Wyline Mood, MD;  Location: [facility] ENDOSCOPY;  Service: Gastroenterology;  Laterality: N/A;   ESOPHAGOGASTRODUODENOSCOPY (EGD) WITH PROPOFOL N/A 12/08/2015   Procedure: ESOPHAGOGASTRODUODENOSCOPY (EGD) WITH PROPOFOL;  Surgeon: Kieth Brightly, MD;  Location: [facility];  Service: Endoscopy;  Laterality: N/A;   ESOPHAGOGASTRODUODENOSCOPY (EGD) WITH PROPOFOL N/A 12/06/2017   Procedure: ESOPHAGOGASTRODUODENOSCOPY (EGD) WITH PROPOFOL;  Surgeon: Wyline Mood, MD;  Location: [facility] ENDOSCOPY;  Service: Gastroenterology;  Laterality: N/A;   ESOPHAGOGASTRODUODENOSCOPY (EGD) WITH PROPOFOL N/A 12/22/2017   Procedure: ESOPHAGOGASTRODUODENOSCOPY (EGD) WITH PROPOFOL;  Surgeon: Wyline Mood, MD;  Location: [facility] ENDOSCOPY;  Service: Gastroenterology;  Laterality: N/A;   ESOPHAGOGASTRODUODENOSCOPY (EGD) WITH PROPOFOL N/A 08/30/2023   Procedure: ESOPHAGOGASTRODUODENOSCOPY (EGD) WITH PROPOFOL;  Surgeon: Wyline Mood, MD;  Location: [facility];  Service: Gastroenterology;  Laterality: N/A;   EYE SURGERY Right 2014   EYE SURGERY Left 2014   GIVENS CAPSULE STUDY N/A 01/23/2018   Procedure: GIVENS CAPSULE STUDY;  Surgeon: Wyline Mood, MD;  Location: [facility] ENDOSCOPY;  Service: Gastroenterology;  Laterality: N/A;   iron infusion     had 5 iron infusions for anemia   post radiation therapy Right    right breast cancer 2002   RE-EXCISION OF BREAST CANCER,SUPERIOR MARGINS Left 11/09/2020   Procedure: RE-EXCISION OF BREAST CANCER,SUPERIOR MARGINS;  Surgeon: Earline Mayotte, MD;  Location: [facility];  Service: General;  Laterality: Left;   Stab pheblectomy   2010   vein closure procedure Bilateral 2008   XI ROBOTIC ASSISTED PARAESOPHAGEAL HERNIA REPAIR N/A 10/10/2023   Procedure: XI ROBOTIC ASSISTED PARAESOPHAGEAL HERNIA REPAIR, RNFA to assist;  Surgeon: Leafy Ro, MD;  Location: [facility];  Service:  General;  Laterality: N/A;    Review of Systems:    All systems reviewed and negative except where noted in HPI.   Physical Examination:   BP 124/67   Pulse 63   Temp 98.7 F (37.1 C)   Ht 5\' 2"  (1.575 m)   Wt 138 lb 12.8 oz (63 kg)   BMI 25.39 kg/m   General: Well-nourished, well-developed in no acute distress.  Lungs: Clear to auscultation bilaterally. Non-labored. Heart: Regular rate and rhythm, no murmurs rubs or gallops.  Abdomen: Bowel sounds are normal; Abdomen is Soft; No hepatosplenomegaly, masses or hernias;  No Abdominal Tenderness; No guarding or rebound tenderness.  Multiple laparoscopic incisions are well-healing and nontender. Neuro: Alert and oriented x 3.  Grossly intact.  Psych: Alert and cooperative, normal mood and affect.   Imaging Studies: No results found.  Assessment and Plan:   Claire Kane is a 83 y.o. y/o female returns for follow-up of nausea, vomiting, and chronic iron deficiency anemia attributed to large paraesophageal hiatal hernia.  Upper GI symptoms have greatly improved since she underwent laparoscopic Nissen fundoplication surgery 10/10/2023.  Constipation improved on MiraLAX.  No GI concerns today.  Stable and improved.  Large paraesophageal hiatal hernia; status post Nissen fundoplication surgery 10/10/2023.  Currently doing well.  Chronic iron deficiency anemia; status post IV iron infusions; followed by hematologist Dr. Cathie Hoops.  Hopefully her anemia will resolve post hiatal hernia repair.  If she has recurrent iron deficiency anemia in the future, we can consider further GI workup in the future such as repeat colonoscopy or capsule endoscopy.  GERD; hopefully this will resolve post hiatal hernia repair.  Try weaning off PPI.  Decrease omeprazole to 20 mg 1 tablet once daily for 1 months.  If doing well after 1 month, then she can discontinue omeprazole.  4.  Chronic constipation  Continue MiraLAX as needed   Celso Amy, PA-C  Follow  up if she has any recurrent GI symptoms in the future.

## 2023-11-22 ENCOUNTER — Encounter: Payer: Self-pay | Admitting: Physician Assistant

## 2023-11-22 ENCOUNTER — Ambulatory Visit: Payer: Medicare PPO | Admitting: Physician Assistant

## 2023-11-22 VITALS — BP 124/67 | HR 63 | Temp 98.7°F | Ht 62.0 in | Wt 138.8 lb

## 2023-11-22 DIAGNOSIS — D509 Iron deficiency anemia, unspecified: Secondary | ICD-10-CM | POA: Diagnosis not present

## 2023-11-22 DIAGNOSIS — K5909 Other constipation: Secondary | ICD-10-CM | POA: Diagnosis not present

## 2023-11-22 DIAGNOSIS — K5900 Constipation, unspecified: Secondary | ICD-10-CM

## 2023-11-22 DIAGNOSIS — K449 Diaphragmatic hernia without obstruction or gangrene: Secondary | ICD-10-CM

## 2023-11-22 DIAGNOSIS — K219 Gastro-esophageal reflux disease without esophagitis: Secondary | ICD-10-CM

## 2023-11-22 MED ORDER — OMEPRAZOLE 20 MG PO CPDR: 20.0000 mg | DELAYED_RELEASE_CAPSULE | Freq: Every day | ORAL | 3 refills | Status: AC

## 2023-12-15 ENCOUNTER — Other Ambulatory Visit: Payer: Self-pay | Admitting: Family Medicine

## 2023-12-18 NOTE — Progress Notes (Unsigned)
 Established patient visit   Patient: Claire Kane   DOB: [birth year]   83 y.o. Female  MRN: [id number] Visit Date: 12/19/2023  Today's healthcare provider: Ronnald Ramp, MD   No chief complaint on file.  Subjective       Discussed the use of AI scribe software for clinical note transcription with the patient, who gave verbal consent to proceed.  History of Present Illness      Past Medical History:  Diagnosis Date   Anemia    fe deficiency now improved at age 65   Anxiety    Arthritis    BRCA 1 and 2 negative 08/04/2015   Breast cancer, left (HCC) 09/2020   Carpal tunnel syndrome of right wrist 06/06/2023   Cataract    Difficult intubation    Ductal carcinoma in situ (DCIS) of right breast    GERD (gastroesophageal reflux disease)    Heart murmur 2001   Hiatal hernia    High cholesterol    Hypertension    Malignant tumor of breast (HCC) 09/19/2020   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   Personal history of radiation therapy 2002, 2022   BREAST CA   Rosacea    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 complications     Medications: Outpatient Medications Prior to Visit  Medication Sig   amLODipine (NORVASC) 5 MG tablet TAKE 1 TABLET EVERY DAY   atorvastatin (LIPITOR) 10 MG tablet TAKE 1 TABLET EVERY DAY   clotrimazole-betamethasone (LOTRISONE) cream Apply 1 Application topically 2 (two) times daily. (Patient taking differently: Apply 1 Application topically 2 (two) times daily as needed.)   exemestane (AROMASIN) 25 MG tablet TAKE 1 TABLET EVERY DAY AFTER BREAKFAST   ezetimibe (ZETIA) 10 MG tablet TAKE 1 TABLET EVERY DAY   Glucosamine 750 MG TABS Take 1,500 mg by mouth daily.   Glycerin-Polysorbate 80 (REFRESH DRY EYE THERAPY OP) Place 1 drop into both eyes 2 (two) times daily.   Iron-Vitamin C (VITRON-C) 65-125 MG TABS Take 1 tablet by mouth daily.   Multiple Vitamins-Minerals (MULTIVITAMIN WITH MINERALS)  tablet Take 1 tablet by mouth daily.   omeprazole (PRILOSEC) 20 MG capsule Take 1 capsule (20 mg total) by mouth daily.   polyethylene glycol (MIRALAX) 17 g packet Take 17 g by mouth daily.   simethicone (MYLICON) 80 MG chewable tablet Chew 80 mg by mouth every 6 (six) hours as needed for flatulence.   solifenacin (VESICARE) 10 MG tablet Take 1 tablet (10 mg total) by mouth daily.   SOOLANTRA 1 % CREA Apply 1 application  topically daily as needed (rosacea).   spironolactone (ALDACTONE) 50 MG tablet Take 50 mg by mouth daily.   traMADol (ULTRAM) 50 MG tablet Take 1 tablet (50 mg total) by mouth every 8 (eight) hours.   Facility-Administered Medications Prior to Visit  Medication Dose Route Frequency Provider   0.9 %  sodium chloride infusion   Intravenous Once [name], MD    Review of Systems  {Insert previous labs (optional):23779} {See past labs  Heme  Chem  Endocrine  Serology  Results Review (optional):1}   Objective    There were no vitals taken for this visit. {Insert last BP/Wt (optional):23777}{See vitals history (optional):1}    Physical Exam  ***  No results found for any visits on 12/19/23.  Assessment & Plan     Problem List Items Addressed This Visit   None   Assessment and Plan Assessment &  Plan      No follow-ups on file.         Ronnald Ramp, MD  [facility] [phone] (phone) [phone] (fax)  [facility]

## 2023-12-18 NOTE — Telephone Encounter (Signed)
 Last OV 10/05/23 within protocol.  Requested Prescriptions  Pending Prescriptions Disp Refills   atorvastatin (LIPITOR) 10 MG tablet [Pharmacy Med Name: Atorvastatin Calcium Oral Tablet 10 MG] 90 tablet 0    Sig: TAKE 1 TABLET EVERY DAY     Cardiovascular:  Antilipid - Statins Failed - 12/18/2023  1:28 PM      Failed - Valid encounter within last 12 months    Recent Outpatient Visits   None     Future Appointments             Tomorrow Simmons-Robinson, Tawanna Cooler, MD [facility]            Failed - Lipid Panel in normal range within the last 12 months    Cholesterol, Total  Date Value Ref Range Status  03/08/2023 140 100 - 199 mg/dL Final   LDL Chol Calc (NIH)  Date Value Ref Range Status  03/08/2023 68 0 - 99 mg/dL Final   HDL  Date Value Ref Range Status  03/08/2023 56 >39 mg/dL Final   Triglycerides  Date Value Ref Range Status  03/08/2023 82 0 - 149 mg/dL Final         Passed - Patient is not pregnant

## 2023-12-19 ENCOUNTER — Ambulatory Visit: Payer: Self-pay | Admitting: Family Medicine

## 2023-12-19 ENCOUNTER — Encounter: Payer: Self-pay | Admitting: Family Medicine

## 2023-12-19 VITALS — BP 114/74 | HR 76 | Temp 97.5°F | Resp 16 | Ht 62.0 in | Wt 141.3 lb

## 2023-12-19 DIAGNOSIS — T451X5A Adverse effect of antineoplastic and immunosuppressive drugs, initial encounter: Secondary | ICD-10-CM

## 2023-12-19 DIAGNOSIS — I10 Essential (primary) hypertension: Secondary | ICD-10-CM

## 2023-12-19 DIAGNOSIS — G620 Drug-induced polyneuropathy: Secondary | ICD-10-CM | POA: Diagnosis not present

## 2023-12-19 MED ORDER — GABAPENTIN 100 MG PO CAPS: 200.0000 mg | ORAL_CAPSULE | Freq: Three times a day (TID) | ORAL | 0 refills | Status: DC

## 2023-12-19 NOTE — Patient Instructions (Signed)
 VISIT SUMMARY:  Today, you came in for a follow-up visit to discuss your ongoing health concerns, including hypertension, chemotherapy-induced neuropathy, degenerative arthritis of the knee, and carpal tunnel syndrome. We reviewed your current medications and discussed potential adjustments to better manage your symptoms.  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 We will restart gabapentin at 200 mg at bedtime and gradually increase the dose as needed, up to 300 mg three times a day, to help manage your symptoms. Your prescription will be sent to [facility] on [address].   -HYPERTENSION: Your blood pressure is well-controlled with your current medications, amlodipine 5 mg daily and spironolactone 50 mg daily. Continue taking these medications, but monitor for symptoms like dizziness or lightheadedness. If your blood pressure drops to 98/60 mmHg or you experience these symptoms, reduce the spironolactone dose.

## 2023-12-19 NOTE — Assessment & Plan Note (Signed)
 Chronic hypertension, well-controlled with a blood pressure of 114/74 mmHg. She is on amlodipine 5 mg daily and has restarted spironolactone 50 mg daily post-hiatal hernia repair surgery. No dizziness or lightheadedness reported since restarting spironolactone. Informed about potential for spironolactone to cause hypotension and advised to monitor for symptoms such as dizziness or lightheadedness. - Continue amlodipine 5 mg daily - Continue spironolactone 50 mg daily - Advise to reduce spironolactone dose if blood pressure drops to 98/60 mmHg or if dizziness/lightheadedness occurs

## 2023-12-19 NOTE — Assessment & Plan Note (Signed)
 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 Gabapentin 300 mg was previously ineffective at lower doses. Discussed potential benefits of gradually increasing gabapentin dose for symptom relief. - Restart gabapentin, beginning with 200 mg at bedtime - Titrate gabapentin dose gradually as tolerated, up to 300 mg three times a day if needed - Send gabapentin prescription to [facility]

## 2023-12-26 ENCOUNTER — Other Ambulatory Visit: Payer: Self-pay

## 2023-12-26 DIAGNOSIS — D509 Iron deficiency anemia, unspecified: Secondary | ICD-10-CM

## 2023-12-26 DIAGNOSIS — D0512 Intraductal carcinoma in situ of left breast: Secondary | ICD-10-CM

## 2023-12-27 ENCOUNTER — Ambulatory Visit: Payer: Medicare PPO

## 2023-12-27 ENCOUNTER — Ambulatory Visit: Payer: Medicare PPO | Admitting: Oncology

## 2023-12-27 ENCOUNTER — Inpatient Hospital Stay: Payer: Medicare PPO | Attending: Oncology

## 2023-12-27 DIAGNOSIS — Z79811 Long term (current) use of aromatase inhibitors: Secondary | ICD-10-CM | POA: Diagnosis not present

## 2023-12-27 DIAGNOSIS — M8580 Other specified disorders of bone density and structure, unspecified site: Secondary | ICD-10-CM | POA: Diagnosis present

## 2023-12-27 DIAGNOSIS — Z170 Estrogen receptor positive status [ER+]: Secondary | ICD-10-CM | POA: Insufficient documentation

## 2023-12-27 DIAGNOSIS — G629 Polyneuropathy, unspecified: Secondary | ICD-10-CM | POA: Diagnosis not present

## 2023-12-27 DIAGNOSIS — D0512 Intraductal carcinoma in situ of left breast: Secondary | ICD-10-CM | POA: Insufficient documentation

## 2023-12-27 DIAGNOSIS — D509 Iron deficiency anemia, unspecified: Secondary | ICD-10-CM | POA: Insufficient documentation

## 2023-12-27 LAB — CMP (CANCER CENTER ONLY)
ALT: 47 U/L — ABNORMAL HIGH (ref 0–44)
AST: 30 U/L (ref 15–41)
Albumin: 4.1 g/dL (ref 3.5–5.0)
Alkaline Phosphatase: 56 U/L (ref 38–126)
Anion gap: 7 (ref 5–15)
BUN: 14 mg/dL (ref 8–23)
CO2: 26 mmol/L (ref 22–32)
Calcium: 8.7 mg/dL — ABNORMAL LOW (ref 8.9–10.3)
Chloride: 101 mmol/L (ref 98–111)
Creatinine: 0.64 mg/dL (ref 0.44–1.00)
GFR, Estimated: >60 mL/min (ref >60)
Glucose, Bld: 94 mg/dL (ref 70–99)
Potassium: 4.2 mmol/L (ref 3.5–5.1)
Sodium: 134 mmol/L — ABNORMAL LOW (ref 135–145)
Total Bilirubin: 1.2 mg/dL (ref 0.0–1.2)
Total Protein: 6.6 g/dL (ref 6.5–8.1)

## 2023-12-27 LAB — CBC WITH DIFFERENTIAL (CANCER CENTER ONLY)
Abs Immature Granulocytes: 0.01 10*3/uL (ref 0.00–0.07)
Basophils Absolute: 0 10*3/uL (ref 0.0–0.1)
Basophils Relative: 1 %
Eosinophils Absolute: 0.2 10*3/uL (ref 0.0–0.5)
Eosinophils Relative: 3 %
HCT: 37.3 % (ref 36.0–46.0)
Hemoglobin: 11.9 g/dL — ABNORMAL LOW (ref 12.0–15.0)
Immature Granulocytes: 0 %
Lymphocytes Relative: 23 %
Lymphs Abs: 1.4 10*3/uL (ref 0.7–4.0)
MCH: 28.8 pg (ref 26.0–34.0)
MCHC: 31.9 g/dL (ref 30.0–36.0)
MCV: 90.3 fL (ref 80.0–100.0)
Monocytes Absolute: 0.7 10*3/uL (ref 0.1–1.0)
Monocytes Relative: 11 %
Neutro Abs: 3.7 10*3/uL (ref 1.7–7.7)
Neutrophils Relative %: 62 %
Platelet Count: 354 10*3/uL (ref 150–400)
RBC: 4.13 MIL/uL (ref 3.87–5.11)
RDW: 16.8 % — ABNORMAL HIGH (ref 11.5–15.5)
WBC Count: 5.9 10*3/uL (ref 4.0–10.5)
nRBC: 0 % (ref 0.0–0.2)

## 2023-12-27 LAB — RETIC PANEL
Immature Retic Fract: 3.8 % (ref 2.3–15.9)
RBC.: 4.06 MIL/uL (ref 3.87–5.11)
Retic Count, Absolute: 34.9 10*3/uL (ref 19.0–186.0)
Retic Ct Pct: 0.9 % (ref 0.4–3.1)
Reticulocyte Hemoglobin: 31.9 pg (ref >27.9)

## 2023-12-27 LAB — IRON AND TIBC
Iron: 75 ug/dL (ref 28–170)
Saturation Ratios: 19 % (ref 10.4–31.8)
TIBC: 389 ug/dL (ref 250–450)
UIBC: 314 ug/dL

## 2023-12-27 LAB — FERRITIN: Ferritin: 32 ng/mL (ref 11–307)

## 2023-12-30 ENCOUNTER — Other Ambulatory Visit: Payer: Self-pay | Admitting: Family Medicine

## 2023-12-30 DIAGNOSIS — M150 Primary generalized (osteo)arthritis: Secondary | ICD-10-CM

## 2024-01-01 ENCOUNTER — Inpatient Hospital Stay: Payer: Medicare PPO | Admitting: Oncology

## 2024-01-01 ENCOUNTER — Encounter: Payer: Self-pay | Admitting: Oncology

## 2024-01-01 ENCOUNTER — Inpatient Hospital Stay: Payer: Medicare PPO

## 2024-01-01 VITALS — BP 139/71 | HR 72 | Temp 98.1°F | Resp 18 | Wt 142.2 lb

## 2024-01-01 DIAGNOSIS — D509 Iron deficiency anemia, unspecified: Secondary | ICD-10-CM | POA: Diagnosis not present

## 2024-01-01 DIAGNOSIS — G629 Polyneuropathy, unspecified: Secondary | ICD-10-CM

## 2024-01-01 DIAGNOSIS — D0512 Intraductal carcinoma in situ of left breast: Secondary | ICD-10-CM | POA: Diagnosis not present

## 2024-01-01 DIAGNOSIS — M8580 Other specified disorders of bone density and structure, unspecified site: Secondary | ICD-10-CM

## 2024-01-01 NOTE — Telephone Encounter (Signed)
 Requested medications are due for refill today.  yes  Requested medications are on the active medications list.  yes  Last refill. 10/05/2023 #90 0 rf  Future visit scheduled.   yes  Notes to clinic.  Refill not delegated.    Requested Prescriptions  Pending Prescriptions Disp Refills   traMADol (ULTRAM) 50 MG tablet [Pharmacy Med Name: TRAMADOL HCL 50 MG TABLET] 90 tablet 0    Sig: TAKE 1 TABLET BY MOUTH EVERY 8 HOURS.     Not Delegated - Analgesics:  Opioid Agonists Failed - 01/01/2024  3:08 PM      Failed - This refill cannot be delegated      Failed - Urine Drug Screen completed in last 360 days      Passed - Valid encounter within last 3 months    Recent Outpatient Visits           1 week ago Primary hypertension   [facility] Mimi Alt, MD

## 2024-01-01 NOTE — Assessment & Plan Note (Addendum)
 on gabapentin.  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

## 2024-01-01 NOTE — Assessment & Plan Note (Addendum)
 Lab Results  Component Value Date   HGB 11.9 (L) 12/27/2023   TIBC 389 12/27/2023   IRONPCTSAT 19 12/27/2023   FERRITIN 32 12/27/2023    S/p Venofer ]hemoglobin has improved.  No IV Venofer  Recommend Vitron C 1 tab twice per week.

## 2024-01-01 NOTE — Assessment & Plan Note (Signed)
 DEXA 10/28/2020 10-year probability of fracture 12.5% Major Osteoporotic Fracture 2.8% Continue  vitamin D supplementation. Plan to resume Prolia today  03/09/23  DEXA showed osteopenia.

## 2024-01-01 NOTE — Assessment & Plan Note (Addendum)
#  Left breast DCIS high grade. pTis, ER positive 2022 Status post left lumpectomy-margin positive status post reexcision achieve negative margin followed by adjuvant breast radiation. Currently on adjuvant endocrine therapy-Aromasin. She tolerates with mild difficulties. Continue Aromasin plan for total 5 years [till April 2027] Recommend  annual mammogram- she gets images through PCP office.  Recent breast biopsy showed fat necrosis.l

## 2024-01-01 NOTE — Progress Notes (Signed)
 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 Currently on adjuvant endocrine therapy-Aromasin. She tolerates with mild difficulties. Continue Aromasin plan for total 5 years [till April 2027] Recommend  annual mammogram- she gets images through PCP office.  Recent breast biopsy showed fat necrosis.l   Iron deficiency anemia Lab Results  Component Value Date   HGB 11.9 (L) 12/27/2023   TIBC 389 12/27/2023   IRONPCTSAT 19 12/27/2023   FERRITIN 32 12/27/2023    S/p Venofer ]hemoglobin has improved.  No IV Venofer  Recommend Vitron C 1 tab twice per week.    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    Osteopenia DEXA 10/28/2020 10-year probability of fracture 12.5% Major Osteoporotic Fracture 2.8% Continue  vitamin D supplementation. Plan to resume Prolia today  03/09/23  DEXA showed osteopenia.   Orders Placed This Encounter  Procedures   CBC with Differential (Cancer Center Only)    Standing Status:   Future    Expected Date:   05/22/2024    Expiration Date:   12/31/2024   Iron and TIBC    Standing Status:   Future    Expected Date:   05/22/2024    Expiration Date:   12/31/2024   Ferritin    Standing Status:   Future    Expected Date:   05/22/2024    Expiration Date:   12/31/2024   CMP (Cancer Center only)    Standing Status:   Future    Expected Date:   05/22/2024    Expiration Date:   12/31/2024   Follow up in Sept 2025 All questions were answered. The patient knows to call the clinic with any problems,  questions or concerns.  Timmy Forbes, MD, PhD [facility] Hematology Oncology 01/01/2024   Orders Placed This Encounter  Procedures   CBC with Differential (Cancer Center Only)    Standing Status:   Future    Expected Date:   05/22/2024    Expiration Date:   12/31/2024   Iron and TIBC    Standing Status:   Future    Expected Date:   05/22/2024    Expiration Date:   12/31/2024   Ferritin    Standing Status:   Future    Expected Date:   05/22/2024    Expiration Date:   12/31/2024   CMP (Cancer Center only)    Standing Status:   Future    Expected Date:   05/22/2024    Expiration Date:   12/31/2024   All questions were answered. The patient knows to call the clinic with any problems, questions or concerns.  Timmy Forbes, MD, PhD [facility] Hematology Oncology 01/01/2024    HISTORY OF PRESENTING ILLNESS:  Claire Kane is a  83 y.o.  female with PMH listed below who was referred to me for evaluation of anemia.  Patient reports she was told that she had iron deficiency 2 years ago and she took iron supplementation for a while and was told to stop. Recently found to to have worsening of anemia. She had Upper and lower endoscopy in 2017 by Dr.Sankar # Upper endoscopy  12/07/2017 Normal examined duodenum. - Large hiatal hernia. - Erythematous mucosa in the antrum. Biopsied. - The examination was otherwise normal # Colonoscopy on 12/07/2017  Non-thrombosed external hemorrhoids found on perianal exam. - Diverticulosis in the sigmoid colon and in the descending colon. - The examination was otherwise normal on direct and retroflexion views. - No specimens collected. #  capsule study which did not reveal any active bleeding source.  # Remote history of DCIS right breast in  2002, s/p surgery and RT. Finished 5 years of tamoxifen.   # April - May s/p upper and lower endoscopy, capsule study which did not reveal active bleeding source  Celiac panel positive.  # iron deficiency anemia.  Previously she has  received IV Venofer. Patient has previously underwent extensive gastroenterology work-up including EGD, colonoscopy, capsule study which did not reveal active bleeding source.  #08/18/2020, screening mammogram showed left breast calcification. 09/07/2020, diagnostic left mammogram showed an area of 1.4 cm group of calcifications in the upper outer left breast.  Patient underwent breast biopsy, pathology came back positive for high-grade DCIS, focal necrosis, associated with calcifications.  ER status deferred to existing specimen.  # #10/07/2020, status post left DCIS lumpectomy by Dr. Lemar Livings Pathology showed residual high-grade DCIS with associated calcifications.  Extent of DCIS at least 12 mm, grade 3, central necrosis, anterior and inferior margins were unifocal in positive for DCIS pTis, ER positive.  11/09/2020 reexcision showed no residual DCIS or invasive carcinoma. 01/04/2021, adjuvant breast radiation  Patient reports family history of VTE daughter who was tested positive for mutation. Significant family history of cardiovascular disease, familiar hypercholesterolemia. Parents, sister, brother passed away from heart attack.  Patient declined genetic testing.  01/08/2021, started on Arimidex.  09/01/2021, switched to Aromasin due to arthralgia.    08/23/22 bilateral diagnostic mammogram - Now identified near the LEFT lumpectomy site are small round calcifications in a somewhat linear orientation spanning a distance of 1 cm. Tissue sampling is recommended. 09/07/22 left breast stereotactic core needle biopsy-benign mammary parenchyma with fibrocystic and fibroadenomatoid changes and focal usual  ductal hyperplasia. No definite evidence of residual atypical proliferative breast disease.    Interval History 83 year old female with pertinent hematology history listed above reviewed by me today presents to reestablish care for left breast DCIS.   Patient takes Aromasin 25mg  daily.    Patient reports feeling well. Compression neuropathy symptoms, carpal tunnel syndrome.     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   Personal history of radiation therapy 2002, 2022   BREAST CA   Rosacea    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 complications     SURGICAL HISTORY: Past Surgical History:  Procedure Laterality Date   ABDOMINAL HYSTERECTOMY     BLEPHAROPLASTY     BREAST BIOPSY Left 09/15/2020   affirm bx, x marker, DCIS  BREAST BIOPSY Left 09/07/2022   Left Breast Stereo Bx X clip- path pending   BREAST BIOPSY Left 09/07/2022   MM LT BREAST BX W LOC DEV 1ST LESION IMAGE BX SPEC STEREO GUIDE 09/07/2022 [facility]   BREAST BIOPSY Left 09/21/2023   u/s bx ribbon clip, path pending   BREAST BIOPSY Left 09/21/2023   [place] LT BREAST BX W LOC DEV 1ST LESION IMG BX SPEC US GUIDE 09/21/2023 [facility]   BREAST CYST ASPIRATION Left 2003   BREAST EXCISIONAL BIOPSY Right 2002   POS   BREAST LUMPECTOMY Right 2002   w/ radiation   BREAST LUMPECTOMY Left 10/07/2020   RESIDUAL HIGH-GRADE DUCTAL CARCINOMA IN SITU (DCIS) DCIS is focally present 1.4 mm to the superior margin.   BREAST LUMPECTOMY Left    re-excision   BREAST LUMPECTOMY WITH NEEDLE LOCALIZATION Left 10/07/2020   Procedure: BREAST LUMPECTOMY WITH NEEDLE LOCALIZATION;  Surgeon: Earline Mayotte, MD;  Location: [facility];  Service: General;  Laterality: Left;   BREAST  SURGERY Right 2002   lumpectomy   CATARACT EXTRACTION W/ INTRAOCULAR LENS  IMPLANT, BILATERAL     CHOLECYSTECTOMY     COLONOSCOPY  2013   COLONOSCOPY WITH PROPOFOL N/A 12/08/2015   Procedure: COLONOSCOPY WITH PROPOFOL;  Surgeon: Kieth Brightly, MD;  Location: [facility];  Service: Endoscopy;  Laterality: N/A;   COLONOSCOPY WITH PROPOFOL N/A 12/06/2017   Procedure: COLONOSCOPY WITH PROPOFOL;  Surgeon: Wyline Mood, MD;  Location: [facility] ENDOSCOPY;  Service: Gastroenterology;  Laterality: N/A;   COLONOSCOPY WITH PROPOFOL N/A 12/22/2017   Procedure: COLONOSCOPY WITH PROPOFOL;  Surgeon: Wyline Mood, MD;  Location: [facility] ENDOSCOPY;  Service: Gastroenterology;  Laterality: N/A;   ESOPHAGOGASTRODUODENOSCOPY (EGD) WITH PROPOFOL N/A 12/08/2015   Procedure: ESOPHAGOGASTRODUODENOSCOPY (EGD) WITH PROPOFOL;  Surgeon: Kieth Brightly, MD;  Location: [facility] ENDOSCOPY;  Service: Endoscopy;  Laterality: N/A;   ESOPHAGOGASTRODUODENOSCOPY (EGD) WITH PROPOFOL N/A 12/06/2017   Procedure: ESOPHAGOGASTRODUODENOSCOPY (EGD) WITH PROPOFOL;  Surgeon: Wyline Mood, MD;  Location: [facility] ENDOSCOPY;  Service: Gastroenterology;  Laterality: N/A;   ESOPHAGOGASTRODUODENOSCOPY (EGD) WITH PROPOFOL N/A 12/22/2017   Procedure: ESOPHAGOGASTRODUODENOSCOPY (EGD) WITH PROPOFOL;  Surgeon: Wyline Mood, MD;  Location: [facility] ENDOSCOPY;  Service: Gastroenterology;  Laterality: N/A;   ESOPHAGOGASTRODUODENOSCOPY (EGD) WITH PROPOFOL N/A 08/30/2023   Procedure: ESOPHAGOGASTRODUODENOSCOPY (EGD) WITH PROPOFOL;  Surgeon: Wyline Mood, MD;  Location: [facility] ENDOSCOPY;  Service: Gastroenterology;  Laterality: N/A;   EYE SURGERY Right 2014   EYE SURGERY Left 2014   GIVENS CAPSULE STUDY N/A 01/23/2018   Procedure: GIVENS CAPSULE STUDY;  Surgeon: Wyline Mood, MD;  Location: [facility] ENDOSCOPY;  Service: Gastroenterology;  Laterality: N/A;   iron infusion     had 5 iron infusions for anemia   post radiation therapy Right    right breast cancer 2002    RE-EXCISION OF BREAST CANCER,SUPERIOR MARGINS Left 11/09/2020   Procedure: RE-EXCISION OF BREAST CANCER,SUPERIOR MARGINS;  Surgeon: Earline Mayotte, MD;  Location: [facility];  Service: General;  Laterality: Left;   Stab pheblectomy   2010   vein closure procedure Bilateral 2008   XI ROBOTIC ASSISTED PARAESOPHAGEAL HERNIA REPAIR N/A 10/10/2023   Procedure: XI ROBOTIC ASSISTED PARAESOPHAGEAL HERNIA REPAIR, RNFA to assist;  Surgeon: Leafy Ro, MD;  Location: [facility];  Service: General;  Laterality: N/A;    SOCIAL HISTORY: Social History   Socioeconomic History   Marital status: Married    Spouse name: Peyton Najjar   Number of children: 2   Years of education: Not on file   Highest  education level: Bachelor's degree (e.g., BA, AB, BS)  Occupational History   Occupation: Runner, broadcasting/film/video    Comment: retired  Tobacco Use   Smoking status: Never    Passive exposure: Never   Smokeless tobacco: Never  Vaping Use   Vaping status: Never Used  Substance and Sexual Activity   Alcohol use: No   Drug use: No   Sexual activity: Never  Other Topics Concern   Not on file  Social History Narrative   Patient lives with husband. She feels safe in her home.   Social Drivers of Corporate investment banker Strain: Low Risk  (02/01/2023)   Overall Financial Resource Strain (CARDIA)    Difficulty of Paying Living Expenses: Not hard at all  Food Insecurity: No Food Insecurity (10/10/2023)   Hunger Vital Sign    Worried About Running Out of Food in the Last Year: Never true    Ran Out of Food in the Last Year: Never true  Transportation Needs: No Transportation Needs (10/10/2023)   PRAPARE - Administrator, Civil Service (Medical): No    Lack of Transportation (Non-Medical): No  Physical Activity: Inactive (02/01/2023)   Exercise Vital Sign    Days of Exercise per Week: 0 days    Minutes of Exercise per Session: 0 min  Stress: No Stress Concern Present (02/01/2023)   [employer] of  Occupational Health - Occupational Stress Questionnaire    Feeling of Stress : Not at all  Social Connections: Moderately Isolated (10/10/2023)   Social Connection and Isolation Panel [NHANES]    Frequency of Communication with Friends and Family: More than three times a week    Frequency of Social Gatherings with Friends and Family: Once a week    Attends Religious Services: Never    Database administrator or Organizations: No    Attends Banker Meetings: Never    Marital Status: Married  Catering manager Violence: Not At Risk (10/10/2023)   Humiliation, Afraid, Rape, and Kick questionnaire    Fear of Current or Ex-Partner: No    Emotionally Abused: No    Physically Abused: No    Sexually Abused: No    FAMILY HISTORY: Family History  Problem Relation Age of Onset   Heart attack Mother    Heart attack Father    Heart attack Sister    Stroke Sister    Heart attack Brother    Other Brother        cerebral hemorrhage, sepsis   Breast cancer Cousin 50       breast/maternal   Cancer Cousin 60       breast/maternal   Breast cancer Cousin    Breast cancer Maternal Aunt 36   Breast cancer Other 48       maternal neice    ALLERGIES:  is allergic to zometa [zoledronic acid].  MEDICATIONS:  Current Outpatient Medications  Medication Sig Dispense Refill   amLODipine (NORVASC) 5 MG tablet TAKE 1 TABLET EVERY DAY 90 tablet 3   atorvastatin (LIPITOR) 10 MG tablet TAKE 1 TABLET EVERY DAY 90 tablet 0   clotrimazole-betamethasone (LOTRISONE) cream Apply 1 Application topically 2 (two) times daily. (Patient taking differently: Apply 1 Application topically 2 (two) times daily as needed.) 30 g 6   exemestane (AROMASIN) 25 MG tablet TAKE 1 TABLET EVERY DAY AFTER BREAKFAST 90 tablet 3   ezetimibe (ZETIA) 10 MG tablet TAKE 1 TABLET EVERY DAY 90 tablet 3   gabapentin (NEURONTIN) 100 MG  capsule Take 2 capsules (200 mg total) by mouth 3 (three) times daily. 180 capsule 0    Glucosamine 750 MG TABS Take 1,500 mg by mouth daily.     Glycerin-Polysorbate 80 (REFRESH DRY EYE THERAPY OP) Place 1 drop into both eyes 2 (two) times daily.     Multiple Vitamins-Minerals (MULTIVITAMIN WITH MINERALS) tablet Take 1 tablet by mouth daily.     omeprazole (PRILOSEC) 20 MG capsule Take 1 capsule (20 mg total) by mouth daily. 90 capsule 3   polyethylene glycol (MIRALAX) 17 g packet Take 17 g by mouth daily. 30 each 0   simethicone (MYLICON) 80 MG chewable tablet Chew 80 mg by mouth every 6 (six) hours as needed for flatulence.     solifenacin (VESICARE) 10 MG tablet Take 1 tablet (10 mg total) by mouth daily. 90 tablet 3   spironolactone (ALDACTONE) 50 MG tablet Take 50 mg by mouth daily.     SOOLANTRA 1 % CREA Apply 1 application  topically daily as needed (rosacea). (Patient not taking: Reported on 12/19/2023)     traMADol (ULTRAM) 50 MG tablet TAKE 1 TABLET BY MOUTH EVERY 8 HOURS. 90 tablet 0   No current facility-administered medications for this visit.   Facility-Administered Medications Ordered in Other Visits  Medication Dose Route Frequency Provider Last Rate Last Admin   0.9 %  sodium chloride infusion   Intravenous Once [name], MD       Review of Systems  Constitutional:  Positive for fatigue. Negative for appetite change, chills and fever.  HENT:   Negative for hearing loss and voice change.   Eyes:  Negative for eye problems.  Respiratory:  Negative for chest tightness and cough.   Cardiovascular:  Negative for chest pain.  Gastrointestinal:  Negative for abdominal pain and blood in stool.  Endocrine: Negative for hot flashes.  Genitourinary:  Negative for difficulty urinating and frequency.   Musculoskeletal:  Negative for arthralgias.  Skin:  Negative for itching and rash.  Neurological:  Negative for extremity weakness.  Hematological:  Negative for adenopathy.  Psychiatric/Behavioral:  Negative for confusion.      PHYSICAL EXAMINATION: ECOG PERFORMANCE  STATUS: 1 - Symptomatic but completely ambulatory Vitals:   01/01/24 1302  BP: 139/71  Pulse: 72  Resp: 18  Temp: 98.1 F (36.7 C)  SpO2: 100%   Filed Weights   01/01/24 1302  Weight: 142 lb 3.2 oz (64.5 kg)    Physical Exam Constitutional:      General: She is not in acute distress.    Appearance: She is not diaphoretic.     Comments: Obese  HENT:     Head: Normocephalic and atraumatic.  Eyes:     General: No scleral icterus. Cardiovascular:     Rate and Rhythm: Normal rate.  Pulmonary:     Effort: Pulmonary effort is normal. No respiratory distress.     Breath sounds: Normal breath sounds.  Abdominal:     General: Bowel sounds are normal. There is no distension.     Palpations: Abdomen is soft.  Musculoskeletal:        General: Normal range of motion.     Cervical back: Normal range of motion and neck supple.  Lymphadenopathy:     Cervical: No cervical adenopathy.  Skin:    General: Skin is warm and dry.  Neurological:     Mental Status: She is alert and oriented to person, place, and time. Mental status is at baseline.  Cranial Nerves: No cranial nerve deficit.     Motor: No abnormal muscle tone.  Psychiatric:        Mood and Affect: Affect normal.        Cognition and Memory: Memory normal.        Judgment: Judgment normal.      LABORATORY DATA:  I have reviewed the data as listed    Latest Ref Rng & Units 12/27/2023    1:05 PM 11/20/2023    1:05 PM 11/06/2023   10:12 AM  CBC  WBC 4.0 - 10.5 K/uL 5.9  6.5  6.0   Hemoglobin 12.0 - 15.0 g/dL 16.1  09.6  04.5   Hematocrit 36.0 - 46.0 % 37.3  36.5  34.5   Platelets 150 - 400 K/uL 354  395  427       Latest Ref Rng & Units 12/27/2023    1:05 PM 11/20/2023    1:05 PM 10/11/2023    6:43 AM  CMP  Glucose 70 - 99 mg/dL 94  95  409   BUN 8 - 23 mg/dL 14  12  11    Creatinine 0.44 - 1.00 mg/dL 8.11  9.14  7.82   Sodium 135 - 145 mmol/L 134  138  135   Potassium 3.5 - 5.1 mmol/L 4.2  4.2  3.2   Chloride 98 -  111 mmol/L 101  102  101   CO2 22 - 32 mmol/L 26  26  24    Calcium 8.9 - 10.3 mg/dL 8.7  8.9  8.5   Total Protein 6.5 - 8.1 g/dL 6.6  6.8    Total Bilirubin 0.0 - 1.2 mg/dL 1.2  1.0    Alkaline Phos 38 - 126 U/L 56  63    AST 15 - 41 U/L 30  53    ALT 0 - 44 U/L 47  47

## 2024-01-19 ENCOUNTER — Other Ambulatory Visit: Payer: Self-pay | Admitting: Obstetrics & Gynecology

## 2024-01-19 DIAGNOSIS — N3281 Overactive bladder: Secondary | ICD-10-CM

## 2024-01-24 ENCOUNTER — Encounter: Payer: Self-pay | Admitting: Surgery

## 2024-01-24 ENCOUNTER — Ambulatory Visit: Admitting: Surgery

## 2024-01-24 VITALS — BP 113/63 | HR 64 | Temp 98.2°F | Ht 62.0 in | Wt 143.0 lb

## 2024-01-24 DIAGNOSIS — Z09 Encounter for follow-up examination after completed treatment for conditions other than malignant neoplasm: Secondary | ICD-10-CM

## 2024-01-24 DIAGNOSIS — K449 Diaphragmatic hernia without obstruction or gangrene: Secondary | ICD-10-CM

## 2024-01-24 NOTE — Patient Instructions (Addendum)
 We suggest that you take a fiber supplement everyday to help with your bowel movements like Metamucil or Benefiber.  You may also take Miralax  if/when you need it to help with constipation.    Follow up here in 3 months. We will send you a letter about this appointment.    Please call and ask to speak with a nurse if you develop questions or concerns.

## 2024-01-24 NOTE — Progress Notes (Signed)
 Outpatient Surgical Follow Up  01/24/2024  Claire Kane is an 83 y.o. female.   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 She is doing well considering her advanced age and the magnitude of her surgery. She is tolerating diet. Now on regular diet, no dysphagia, no reflux. 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/2023   Cataract    Difficult intubation    Ductal carcinoma in situ (DCIS) of right breast    GERD (gastroesophageal reflux disease)    Heart murmur 2001   Hiatal hernia    High cholesterol    Hypertension    Malignant tumor of breast (HCC) 09/19/2020   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   Personal history of radiation therapy 2002, 2022   BREAST CA   Rosacea    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 complications     Past Surgical History:  Procedure Laterality Date   ABDOMINAL HYSTERECTOMY     BLEPHAROPLASTY     BREAST BIOPSY Left 09/15/2020   affirm bx, x marker, DCIS   BREAST BIOPSY Left 09/07/2022   Left Breast Stereo Bx X clip- path pending   BREAST BIOPSY Left 09/07/2022   MM LT BREAST BX W LOC DEV 1ST LESION IMAGE BX SPEC STEREO GUIDE 09/07/2022 [facility]   BREAST BIOPSY Left 09/21/2023   u/s bx ribbon clip, path pending   BREAST BIOPSY Left 09/21/2023   US  LT BREAST BX W LOC DEV 1ST LESION IMG BX SPEC US  GUIDE 09/21/2023 [facility]   BREAST CYST ASPIRATION Left 2003   BREAST EXCISIONAL BIOPSY Right 2002   POS   BREAST LUMPECTOMY Right 2002   w/ radiation   BREAST LUMPECTOMY Left 10/07/2020    RESIDUAL HIGH-GRADE DUCTAL CARCINOMA IN SITU (DCIS) DCIS is focally present 1.4 mm to the superior margin.   BREAST LUMPECTOMY Left    re-excision   BREAST LUMPECTOMY WITH NEEDLE LOCALIZATION Left 10/07/2020   Procedure: BREAST LUMPECTOMY WITH NEEDLE LOCALIZATION;  Surgeon: Marshall Skeeter, MD;  Location: [facility];  Service: General;  Laterality: Left;   BREAST SURGERY Right 2002   lumpectomy   CATARACT EXTRACTION W/ INTRAOCULAR LENS  IMPLANT, BILATERAL     CHOLECYSTECTOMY     COLONOSCOPY  2013   COLONOSCOPY WITH PROPOFOL  N/A 12/08/2015   Procedure: COLONOSCOPY WITH PROPOFOL ;  Surgeon: Jerlean Mood, MD;  Location: [facility];  Service: Endoscopy;  Laterality: N/A;   COLONOSCOPY WITH PROPOFOL  N/A 12/06/2017   Procedure: COLONOSCOPY WITH PROPOFOL ;  Surgeon: Luke Salaam, MD;  Location: [facility] END[facility]COPY;  Service: Gastroenterology;  Laterality: N/A;   COLONOSCOPY WITH PROPOFOL  N/A 12/22/2017   Procedure: COLONOSCOPY WITH PROPOFOL ;  Surgeon: Luke Salaam, MD;  Location: [facility] ENDOSCOPY;  Service: Gastroenterology;  Laterality: N/A;   ESOPHAGOGASTRODUODENOSCOPY (EGD) WITH PROPOFOL  N/A 12/08/2015   Procedure: ESOPHAGOGASTRODUODENOSCOPY (EGD) WITH PROPOFOL ;  Surgeon: Jerlean Mood, MD;  Location: [facility];  Service: Endoscopy;  Laterality: N/A;   ESOPHAGOGASTRODUODENOSCOPY (EGD) WITH PROPOFOL  N/A 12/06/2017   Procedure: ESOPHAGOGASTRODUODENOSCOPY (  EGD) WITH PROPOFOL ;  Surgeon: Luke Salaam, MD;  Location[facility] END[facility]COPY;  Service: Gastroenterology;  Laterality: N/A;   ESOPHAGOGASTRODUODENOSCOPY (EGD) WITH PROPOFOL  N/A 12/22/2017   Procedure: ESOPHAGOGASTRODUODENOSCOPY (EGD) WITH PROPOFOL ;  Surgeon: Luke Salaam, MD;  Location: [facility] ENDOSCOPY;  Service: Gastroenterology;  Laterality: N/A;   ESOPHAGOGASTRODUODENOSCOPY (EGD) WITH PROPOFOL  N/A 08/30/2023   Procedure: ESOPHAGOGASTRODUODENOSCOPY (EGD) WITH PROPOFOL ;  Surgeon: Luke Salaam, MD;  Location: [facility] ENDOSCOPY;  Service:  Gastroenterology;  Laterality: N/A;   EYE SURGERY Right 2014   EYE SURGERY Left 2014   GIVENS CAPSULE STUDY N/A 01/23/2018   Procedure: GIVENS CAPSULE STUDY;  Surgeon: Luke Salaam, MD;  Location: [facility] ENDOSCOPY;  Service: Gastroenterology;  Laterality: N/A;   iron  infusion     had 5 iron  infusions for anemia   post radiation therapy Right    right breast cancer 2002   RE-EXCISION OF BREAST CANCER,SUPERIOR MARGINS Left 11/09/2020   Procedure: RE-EXCISION OF BREAST CANCER,SUPERIOR MARGINS;  Surgeon: Marshall Skeeter, MD;  Location: [facility];  Service: General;  Laterality: Left;   Stab pheblectomy   2010   vein closure procedure Bilateral 2008   XI ROBOTIC ASSISTED PARAESOPHAGEAL HERNIA REPAIR N/A 10/10/2023   Procedure: XI ROBOTIC ASSISTED PARAESOPHAGEAL HERNIA REPAIR, RNFA to assist;  Surgeon: Alben Alma, MD;  Location: [facility];  Service: General;  Laterality: N/A;    Family History  Problem Relation Age of Onset   Heart attack Mother    Heart attack Father    Heart attack Sister    Stroke Sister    Heart attack Brother    Other Brother        cerebral hemorrhage, sepsis   Breast cancer Cousin 50       breast/maternal   Cancer Cousin 60       breast/maternal   Breast cancer Cousin    Breast cancer Maternal Aunt 80   Breast cancer Other 48       maternal neice    Social History:  reports that she has never smoked. She has never been exposed to tobacco smoke. She has never used smokeless tobacco. She reports that she does not drink alcohol and does not use drugs.  Allergies:  Allergies  Allergen Reactions   Zometa  [Zoledronic  Acid]     Severe back pain    Medications reviewed.    ROS Full ROS performed and is otherwise negative other than what is stated in HPI   BP 113/63   Pulse 64   Temp 98.2 F (36.8 C)   Ht 5\' 2"  (1.575 m)   Wt 143 lb (64.9 kg)   SpO2 97%   BMI 26.16 kg/m   Physical Exam Vitals and nursing note reviewed. Exam conducted with a  chaperone present.  Constitutional:      General: She is not in acute distress.    Appearance: Normal appearance. She is normal weight.  Pulmonary:     Effort: Pulmonary effort is normal. No respiratory distress.     Breath sounds: No stridor.  Abdominal:     General: Abdomen is flat. There is no distension.     Palpations: Abdomen is soft. There is no mass.     Tenderness: There is no abdominal tenderness. There is no guarding or rebound.     Hernia: No hernia is present.     Comments: Incisions completely healed.  Musculoskeletal:     Cervical back: Normal range of motion.  Skin:    General: Skin is warm and dry.  Capillary Refill: Capillary refill takes less than 2 seconds.     Coloration: Skin is not jaundiced.  Neurological:     General: No focal deficit present.     Mental Status: She is alert and oriented to person, place, and time.  Psychiatric:        Mood and Affect: Mood normal.        Behavior: Behavior normal.        Thought Content: Thought content normal.        Judgment: Judgment normal.     Assessment/Plan: 83 year old fe 4 months out from paraesophageal hernia.  Doing very well considering her limited physiologic reserve.  We had a lengthy discussion regarding her constipation and the appropriate use of laxative and fiber.  I did encourage her to do fiber diet.  She has no reflux and her preoperative symptoms have dramatically improved.  There is no evidence of surgical complications.  Or I also encouraged her to follow-up with GI regarding some of her functional GI issues.  Please note that I spent 30 minutes in this encounter including reviewing medical records, reviewing personally imaging studies, coordinating her care, extensive counseling and performing documentation  Evelia Hipp, MD [facility] General Surgeon

## 2024-02-06 ENCOUNTER — Ambulatory Visit (INDEPENDENT_AMBULATORY_CARE_PROVIDER_SITE_OTHER): Payer: Medicare PPO

## 2024-02-06 DIAGNOSIS — Z0000 Encounter for general adult medical examination without abnormal findings: Secondary | ICD-10-CM | POA: Diagnosis not present

## 2024-02-06 NOTE — Progress Notes (Signed)
 Subjective:   Claire Kane is a 83 y.o. who presents for a Medicare Wellness preventive visit.  As a reminder, Annual Wellness Visits don't include a physical exam, and some assessments may be limited, especially if this visit is performed virtually. We may recommend an in-person follow-up visit with your provider if needed.  Visit Complete: Virtual I connected with  [name] on 02/06/24 by a audio enabled telemedicine application and verified that I am speaking with the correct person using two identifiers.  Patient Location: Home  Provider Location: Office/Clinic  I discussed the limitations of evaluation and management by telemedicine. The patient expressed understanding and agreed to proceed.  Vital Signs: Because this visit was a virtual/telehealth visit, some criteria may be missing or patient reported. Any vitals not documented were not able to be obtained and vitals that have been documented are patient reported.  VideoDeclined- This patient declined Librarian, academic. Therefore the visit was completed with audio only.  Persons Participating in Visit: Patient.  AWV Questionnaire: No: Patient Medicare AWV questionnaire was not completed prior to this visit.  Cardiac Risk Factors include: advanced age (>61men, >77 women);hypertension;dyslipidemia;sedentary lifestyle     Objective:     There were no vitals filed for this visit. There is no height or weight on file to calculate BMI.     02/06/2024    1:25 PM 11/06/2023   10:24 AM 10/10/2023    6:25 AM 10/02/2023    9:40 AM 09/15/2023    9:00 AM 09/13/2023    9:21 PM 08/30/2023   10:23 AM  Advanced Directives  Does Patient Have a Medical Advance Directive? No No Yes Yes Yes No Yes  Type of Surveyor, minerals;Living will Healthcare Power of [place];Living will Healthcare Power of [place];Living will  Healthcare Power of [place];Living will  Does patient  want to make changes to medical advance directive?   No - Patient declined No - Patient declined No - Patient declined    Copy of Healthcare Power of Attorney in Chart?   No - copy requested No - copy requested No - copy requested    Would patient like information on creating a medical advance directive? No - Patient declined No - Patient declined   No - Patient declined      Current Medications (verified) Outpatient Encounter Medications as of 02/06/2024  Medication Sig   amLODipine  (NORVASC ) 5 MG tablet TAKE 1 TABLET EVERY DAY   atorvastatin  (LIPITOR) 10 MG tablet TAKE 1 TABLET EVERY DAY   clotrimazole -betamethasone  (LOTRISONE ) cream Apply 1 Application topically 2 (two) times daily. (Patient taking differently: Apply 1 Application topically as needed.)   denosumab  (PROLIA ) 60 MG/ML SOSY injection Inject 60 mg into the skin every 6 (six) months.   exemestane  (AROMASIN ) 25 MG tablet TAKE 1 TABLET EVERY DAY AFTER BREAKFAST   ezetimibe  (ZETIA ) 10 MG tablet TAKE 1 TABLET EVERY DAY   Glucosamine 750 MG TABS Take 1,500 mg by mouth daily.   Iron -Vitamin C (VITRON-C PO) Take by mouth 2 (two) times a week.   Multiple Vitamins-Minerals (MULTIVITAMIN WITH MINERALS) tablet Take 1 tablet by mouth daily.   omeprazole  (PRILOSEC) 20 MG capsule Take 1 capsule (20 mg total) by mouth daily.   polyethylene glycol (MIRALAX ) 17 g packet Take 17 g by mouth daily.   simethicone (MYLICON) 80 MG chewable tablet Chew 80 mg by mouth every 6 (six) hours as needed for flatulence.   solifenacin  (VESICARE )  10 MG tablet Take 1 tablet (10 mg total) by mouth daily.   spironolactone  (ALDACTONE ) 50 MG tablet Take 50 mg by mouth daily.   traMADol  (ULTRAM ) 50 MG tablet TAKE 1 TABLET BY MOUTH EVERY 8 HOURS.   ferrous sulfate  324 MG TBEC Take 324 mg by mouth. (Patient not taking: Reported on 02/06/2024)   gabapentin  (NEURONTIN ) 100 MG capsule Take 2 capsules (200 mg total) by mouth 3 (three) times daily. 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/2023   Cataract    Difficult intubation    Ductal carcinoma in situ (DCIS) of right breast    GERD (gastroesophageal reflux disease)    Heart murmur 2001   Hiatal hernia    High cholesterol    Hypertension    Malignant tumor of breast (HCC) 09/19/2020   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   Personal history of radiation therapy 2002, 2022   BREAST CA   Rosacea    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 complications    Past Surgical History:  Procedure Laterality Date   ABDOMINAL HYSTERECTOMY     BLEPHAROPLASTY     BREAST BIOPSY Left 09/15/2020   affirm bx, x marker, DCIS   BREAST BIOPSY Left 09/07/2022   Left Breast Stereo Bx X clip- path pending   BREAST BIOPSY Left 09/07/2022   MM LT BREAST BX W LOC DEV 1ST LESION IMAGE BX SPEC STEREO GUIDE 09/07/2022 [facility]   BREAST BIOPSY Left 09/21/2023   u/s bx ribbon clip, path pending   BREAST BIOPSY Left 09/21/2023   US  LT BREAST BX W LOC DEV 1ST LESION IMG BX SPEC US  GUIDE 09/21/2023 [facility]   BREAST CYST ASPIRATION Left 2003   BREAST EXCISIONAL BIOPSY Right 2002   POS   BREAST LUMPECTOMY Right 2002   w/ radiation   BREAST LUMPECTOMY Left 10/07/2020   RESIDUAL HIGH-GRADE DUCTAL CARCINOMA IN SITU (DCIS) DCIS is focally present 1.4 mm to the superior margin.   BREAST LUMPECTOMY Left    re-excision   BREAST LUMPECTOMY WITH NEEDLE LOCALIZATION Left 10/07/2020   Procedure: BREAST LUMPECTOMY WITH NEEDLE LOCALIZATION;  Surgeon: Marshall Skeeter, MD;   Location: [facility];  Service: General;  Laterality: Left;   BREAST SURGERY Right 2002   lumpectomy   CATARACT EXTRACTION W/ INTRAOCULAR LENS  IMPLANT, BILATERAL     CHOLECYSTECTOMY     COLONOSCOPY  2013   COLONOSCOPY WITH PROPOFOL  N/A 12/08/2015   Procedure: COLONOSCOPY WITH PROPOFOL ;  Surgeon: Jerlean Mood, MD;  Location: [facility];  Service: Endoscopy;  Laterality: N/A;   COLONOSCOPY WITH PROPOFOL  N/A 12/06/2017   Procedure: COLONOSCOPY WITH PROPOFOL ;  Surgeon: Luke Salaam, MD;  Location: [facility] END[facility]COPY;  Service: Gastroenterology;  Laterality: N/A;   COLONOSCOPY WITH PROPOFOL  N/A 12/22/2017   Procedure: COLONOSCOPY WITH PROPOFOL ;  Surgeon: Luke Salaam, MD;  Location: [facility] ENDOSCOPY;  Service: Gastroenterology;  Laterality: N/A;   ESOPHAGOGASTRODUODENOSCOPY (EGD) WITH PROPOFOL  N/A 12/08/2015   Procedure: ESOPHAGOGASTRODUODENOSCOPY (EGD) WITH PROPOFOL ;  Surgeon: Jerlean Mood, MD;  Location: [facility]OSCOPY;  Service: Endoscopy;  Laterality: N/A;   ESOPHAGOGASTRODUODENOSCOPY (EGD) WITH PROPOFOL  N/A 12/06/2017   Procedure: ESOPHAGOGASTRODUODENOSCOPY (EGD) WITH PROPOFOL ;  Surgeon: Luke Salaam, MD;  Location: [facility]COPY;  Service: Gastroenterology;  Laterality: N/A;   ESOPHAGOGASTRODUODENOSCOPY (EGD) WITH PROPOFOL  N/A 12/22/2017   Procedure: ESOPHAGOGASTRODUODENOSCOPY (EGD) WITH PROPOFOL ;  Surgeon: Luke Salaam, MD;  Location: [facility] ENDOSCOPY;  Service: Gastroenterology;  Laterality: N/A;   ESOPHAGOGASTRODUODENOSCOPY (EGD) WITH PROPOFOL  N/A 08/30/2023   Procedure: ESOPHAGOGASTRODUODENOSCOPY (EGD) WITH PROPOFOL ;  Surgeon: Luke Salaam, MD;  Location: [facility] ENDOSCOPY;  Service: Gastroenterology;  Laterality: N/A;   EYE SURGERY Right 2014   EYE SURGERY Left 2014   GIVENS CAPSULE STUDY N/A 01/23/2018   Procedure: GIVENS CAPSULE STUDY;  Surgeon: Luke Salaam, MD;  Location: [facility] ENDOSCOPY;  Service: Gastroenterology;  Laterality: N/A;   iron  infusion     had 5 iron  infusions for  anemia   post radiation therapy Right    right breast cancer 2002   RE-EXCISION OF BREAST CANCER,SUPERIOR MARGINS Left 11/09/2020   Procedure: RE-EXCISION OF BREAST CANCER,SUPERIOR MARGINS;  Surgeon: Marshall Skeeter, MD;  Location: [facility];  Service: General;  Laterality: Left;   Stab pheblectomy   2010   vein closure procedure Bilateral 2008   XI ROBOTIC ASSISTED PARAESOPHAGEAL HERNIA REPAIR N/A 10/10/2023   Procedure: XI ROBOTIC ASSISTED PARAESOPHAGEAL HERNIA REPAIR, RNFA to assist;  Surgeon: Alben Alma, MD;  Location: [facility];  Service: General;  Laterality: N/A;   Family History  Problem Relation Age of Onset   Heart attack Mother    Heart attack Father    Heart attack Sister    Stroke Sister    Heart attack Brother    Other Brother        cerebral hemorrhage, sepsis   Breast cancer Cousin 50       breast/maternal   Cancer Cousin 60       breast/maternal   Breast cancer Cousin    Breast cancer Maternal Aunt 57   Breast cancer Other 48       maternal neice   Social History   Socioeconomic History   Marital status: Married    Spouse name: Hewitt Lou   Number of children: 2   Years of education: Not on file   Highest education level: Bachelor's degree (e.g., BA, AB, BS)  Occupational History   Occupation: Runner, broadcasting/film/video    Comment: retired  Tobacco Use   Smoking status: Never    Passive exposure: Never   Smokeless tobacco: Never  Vaping Use   Vaping status: Never Used  Substance and Sexual Activity   Alcohol use: No   Drug use: No   Sexual activity: Never  Other Topics Concern   Not on file  Social History Narrative   Patient lives with husband. She feels safe in her home.   Social Drivers of Corporate investment banker Strain: Low Risk  (02/06/2024)   Overall Financial Resource Strain (CARDIA)    Difficulty of Paying Living Expenses: Not hard at all  Food Insecurity: No Food Insecurity (02/06/2024)   Hunger Vital Sign    Worried About Running Out of Food in  the Last Year: Never true    Ran Out of Food in the Last Year: Never true  Transportation Needs: No Transportation Needs (02/06/2024)   PRAPARE - Administrator, Civil Service (Medical): No    Lack of Transportation (Non-Medical): No  Physical Activity: Insufficiently Active (02/06/2024)   Exercise Vital Sign  Days of Exercise per Week: 2 days    Minutes of Exercise per Session: 20 min  Stress: No Stress Concern Present (02/06/2024)   [employer] of Occupational Health - Occupational Stress Questionnaire    Feeling of Stress : Only a little  Social Connections: Unknown (02/06/2024)   Social Connection and Isolation Panel [NHANES]    Frequency of Communication with Friends and Family: More than three times a week    Frequency of Social Gatherings with Friends and Family: Once a week    Attends Religious Services: Patient declined    Database administrator or Organizations: No    Attends Engineer, structural: Never    Marital Status: Married    Tobacco Counseling Counseling given: Not Answered    Clinical Intake:  Pre-visit preparation completed: Yes  Pain : No/denies pain     BMI - recorded: 26.2 Nutritional Status: BMI 25 -29 Overweight Nutritional Risks: None Diabetes: No  Lab Results  Component Value Date   HGBA1C 5.8 (H) 09/01/2022   HGBA1C 5.9 (A) 08/05/2021   HGBA1C 6.1 (H) 03/30/2021     How often do you need to have someone help you when you read instructions, pamphlets, or other written materials from your doctor or pharmacy?: 1 - Never  Interpreter Needed?: No  Information entered by :: Dellie Fergusson, LPN   Activities of Daily Living    02/06/2024    1:26 PM 02/05/2024    4:11 PM  In your present state of health, do you have any difficulty performing the following activities:  Hearing? 0 0  Vision? 0 0  Difficulty concentrating or making decisions? 1 1  Walking or climbing stairs? 1 1  Dressing or bathing? 0 0  Doing  errands, shopping? 0 0  Preparing Food and eating ? N N  Using the Toilet? N N  In the past six months, have you accidently leaked urine? N   Do you have problems with loss of bowel control? Y Y  Managing your Medications? N N  Managing your Finances? N N  Housekeeping or managing your Housekeeping? N N    Patient Care Team: Mimi Alt, MD as PCP - General (Family Medicine) Dingeldein, Landon Pinion, MD as Consulting Physician (Ophthalmology) Alben Alma, MD as Referring Physician (Obstetrics and Gynecology) Timmy Forbes, MD as Consulting Physician (Oncology) Jacqulyne Maxim, MD as Referring Physician (Internal Medicine) Enriqueta Harvey, RN as Case Manager (General Practice)  Indicate any recent Medical Services you may have received from other than [facility] providers in the past year (date may be approximate).     Assessment:    This is a routine wellness examination for Breckin.  Hearing/Vision screen Hearing Screening - Comments:: NO AIDS Vision Screening - Comments:: READERS, HAD CATARACT SGY- DR.DINGELDEIN   Goals Addressed             This Visit's Progress    Cut out extra servings         Depression Screen     02/06/2024    1:22 PM 10/05/2023    9:19 AM 03/03/2023   11:19 AM 02/01/2023    1:16 PM 09/01/2022   10:45 AM 06/14/2022    1:14 PM 01/13/2022    9:15 AM  PHQ 2/9 Scores  PHQ - 2 Score 0 0 0 0 0 0 0  PHQ- 9 Score 2 1 2  2       Fall Risk     02/06/2024    1:25 PM  02/05/2024    4:11 PM 10/05/2023    9:19 AM 03/03/2023   11:19 AM 02/01/2023    1:06 PM  Fall Risk   Falls in the past year? 0 0 0 0 0  Number falls in past yr: 0 0 0 0 0  Injury with Fall? 0  0 1 0  Risk for fall due to : No Fall Risks   History of fall(s) No Fall Risks  Follow up Falls evaluation completed   Falls evaluation completed Education provided;Falls prevention discussed    MEDICARE RISK AT HOME:  Medicare Risk at Home Any stairs in or around the home?: Yes If so, are  there any without handrails?: No Home free of loose throw rugs in walkways, pet beds, electrical cords, etc?: Yes Adequate lighting in your home to reduce risk of falls?: Yes Life alert?: No Use of a cane, walker or w/c?: No Grab bars in the bathroom?: Yes Shower chair or bench in shower?: No Elevated toilet seat or a handicapped toilet?: Yes  TIMED UP AND GO:  Was the test performed?  No  Cognitive Function: 6CIT completed        02/06/2024    1:28 PM 02/01/2023    1:23 PM 06/11/2019   11:37 AM 02/20/2018   10:54 AM 12/13/2016    3:25 PM  6CIT Screen  What Year? 0 points 0 points 0 points 0 points 0 points  What month? 0 points 0 points 0 points 0 points 0 points  What time? 0 points 0 points 0 points 0 points 0 points  Count back from 20 0 points 0 points 0 points 0 points 0 points  Months in reverse 0 points 0 points 0 points 0 points 0 points  Repeat phrase 0 points 0 points 0 points 0 points 0 points  Total Score 0 points 0 points 0 points 0 points 0 points    Immunizations Immunization History  Administered Date(s) Administered   Fluad Quad(high Dose 65+) 07/23/2020   Fluad Trivalent(High Dose 65+) 06/20/2023   Influenza, High Dose Seasonal PF 05/28/2015, 07/05/2017, 06/10/2019   Influenza,inj,Quad PF,6+ Mos 06/27/2018   Influenza-Unspecified 05/09/2016   PFIZER Comirnaty(Gray Top)Covid-19 Tri-Sucrose Vaccine 01/12/2021, 06/13/2021   PFIZER(Purple Top)SARS-COV-2 Vaccination 10/08/2019, 11/02/2019, 06/25/2020   Pneumococcal Conjugate-13 04/22/2014   Pneumococcal Polysaccharide-23 03/15/2011   Td 09/06/2007, 12/30/2012   Tdap 04/22/2014, 06/12/2023   Zoster, Live 06/27/2011    Screening Tests Health Maintenance  Topic Date Due   Zoster Vaccines- Shingrix (1 of 2) 05[birth year]13/1960   COVID-19 Vaccine (6 - 2024-25 season) 05/21/2023   INFLUENZA VACCINE  04/19/2024   MAMMOGRAM  08/28/2024   Medicare Annual Wellness (AWV)  02/05/2025   DEXA SCAN  03/08/2025    DTaP/Tdap/Td (5 - Td or Tdap) 06/11/2033   Pneumonia Vaccine 20+ Years old  Completed   HPV VACCINES  Aged Out   Meningococcal B Vaccine  Aged Out    Health Maintenance  Health Maintenance Due  Topic Date Due   Zoster Vaccines- Shingrix (1 of 2) 05[birth year]13/1960   COVID-19 Vaccine (6 - 2024-25 season) 05/21/2023   Health Maintenance Items Addressed: Up to dates w/ shots except PNA & COVID; MAMMOGRAM & BONE DENSITY UP TO DATE  Additional Screening:  Vision Screening: Recommended annual ophthalmology exams for early detection of glaucoma and other disorders of the eye.  Dental Screening: Recommended annual dental exams for proper oral hygiene  Community Resource Referral / Chronic Care Management: CRR required this visit?  No  CCM required this visit?  No   Plan:    I have personally reviewed and noted the following in the patient's chart:   Medical and social history Use of alcohol, tobacco or illicit drugs  Current medications and supplements including opioid prescriptions. Patient is not currently taking opioid prescriptions. Functional ability and status Nutritional status Physical activity Advanced directives List of other physicians Hospitalizations, surgeries, and ER visits in previous 12 months Vitals Screenings to include cognitive, depression, and falls Referrals and appointments  In addition, I have reviewed and discussed with patient certain preventive protocols, quality metrics, and best practice recommendations. A written personalized care plan for preventive services as well as general preventive health recommendations were provided to patient.   Pinky Bright, LPN   12/26/8117   After Visit Summary: (MyChart) Due to this being a telephonic visit, the after visit summary with patients personalized plan was offered to patient via MyChart   Notes: Nothing significant to report at this time.

## 2024-02-06 NOTE — Patient Instructions (Signed)
 [name] , Thank you for taking time out of your busy schedule to complete your Annual Wellness Visit with me. I enjoyed our conversation and look forward to speaking with you again next year. I, as well as your care team,  appreciate your ongoing commitment to your health goals. Please review the following plan we discussed and let me know if I can assist you in the future.  Follow up Visits: Next Medicare AWV with our clinical staff: 02/12/25 @ 1:50 PM BY PHONE   Have you seen your provider in the last 6 months (3 months if uncontrolled diabetes)? Yes   Clinician Recommendations:  Aim for 30 minutes of exercise or brisk walking, 6-8 glasses of water, and 5 servings of fruits and vegetables each day. TAKE CARE!      This is a list of the screening recommended for you and due dates:  Health Maintenance  Topic Date Due   Zoster (Shingles) Vaccine (1 of 2) 05[birth year]13/1960   COVID-19 Vaccine (6 - 2024-25 season) 05/21/2023   Flu Shot  04/19/2024   Mammogram  08/28/2024   Medicare Annual Wellness Visit  02/05/2025   DEXA scan (bone density measurement)  03/08/2025   DTaP/Tdap/Td vaccine (5 - Td or Tdap) 06/11/2033   Pneumonia Vaccine  Completed   HPV Vaccine  Aged Out   Meningitis B Vaccine  Aged Out    Advanced directives: (ACP Link)Information on Advanced Care Planning can be found at Neurosurgeon Advance Health Care Directives Advance Health Care Directives. [email or web address]  Advance Care Planning is important because it:  [x]  Makes sure you receive the medical care that is consistent with your values, goals, and preferences  [x]  It provides guidance to your family and loved ones and reduces their decisional burden about whether or not they are making the right decisions based on your wishes.  Follow the link provided in your after visit summary or read over the paperwork we have mailed to you to help you started getting your Advance Directives in place. If you need assistance  in completing these, please reach out to us  so that we can help you!

## 2024-03-07 ENCOUNTER — Telehealth: Payer: Self-pay | Admitting: Surgery

## 2024-03-07 ENCOUNTER — Other Ambulatory Visit: Payer: Self-pay

## 2024-03-07 DIAGNOSIS — R110 Nausea: Secondary | ICD-10-CM

## 2024-03-07 DIAGNOSIS — R197 Diarrhea, unspecified: Secondary | ICD-10-CM

## 2024-03-07 NOTE — Telephone Encounter (Signed)
 Pt had PARAESOPHAGEAL HERNIA REPAIR, on 10-10-2023 and everything was good till about 5 wks ago. She is experiencing nausea and diarrhea. Wants to know if they can give her something for this. Pt number is [phone] and pharm is [facility] on [address]

## 2024-03-07 NOTE — Telephone Encounter (Signed)
 Called patient and let her know that Dr Mauri Sous would like her to have a CXR and KUB done due to the length of time with her nausea and diarrhea. She may also take Imodium for her diarrhea. We will call her with her results.

## 2024-03-21 ENCOUNTER — Other Ambulatory Visit: Payer: Self-pay | Admitting: Family Medicine

## 2024-04-10 ENCOUNTER — Other Ambulatory Visit: Payer: Self-pay | Admitting: Family Medicine

## 2024-04-22 ENCOUNTER — Ambulatory Visit: Admitting: Surgery

## 2024-05-13 ENCOUNTER — Ambulatory Visit: Admitting: Surgery

## 2024-05-21 ENCOUNTER — Ambulatory Visit
Admission: RE | Admit: 2024-05-21 | Discharge: 2024-05-21 | Disposition: A | Discharge status: Home / Self Care | Source: Ambulatory Visit | Attending: Surgery | Admitting: Surgery

## 2024-05-21 ENCOUNTER — Inpatient Hospital Stay: Attending: Oncology

## 2024-05-21 DIAGNOSIS — Z170 Estrogen receptor positive status [ER+]: Secondary | ICD-10-CM | POA: Insufficient documentation

## 2024-05-21 DIAGNOSIS — D0512 Intraductal carcinoma in situ of left breast: Secondary | ICD-10-CM | POA: Insufficient documentation

## 2024-05-21 DIAGNOSIS — D509 Iron deficiency anemia, unspecified: Secondary | ICD-10-CM | POA: Diagnosis not present

## 2024-05-21 DIAGNOSIS — M8580 Other specified disorders of bone density and structure, unspecified site: Secondary | ICD-10-CM | POA: Diagnosis not present

## 2024-05-21 DIAGNOSIS — R197 Diarrhea, unspecified: Secondary | ICD-10-CM | POA: Insufficient documentation

## 2024-05-21 DIAGNOSIS — R110 Nausea: Secondary | ICD-10-CM

## 2024-05-21 DIAGNOSIS — K449 Diaphragmatic hernia without obstruction or gangrene: Secondary | ICD-10-CM | POA: Diagnosis not present

## 2024-05-21 DIAGNOSIS — Z79811 Long term (current) use of aromatase inhibitors: Secondary | ICD-10-CM | POA: Diagnosis not present

## 2024-05-21 LAB — CMP (CANCER CENTER ONLY)
ALT: 16 U/L (ref 0–44)
AST: 17 U/L (ref 15–41)
Albumin: 4.2 g/dL (ref 3.5–5.0)
Alkaline Phosphatase: 43 U/L (ref 38–126)
Anion gap: 8 (ref 5–15)
BUN: 16 mg/dL (ref 8–23)
CO2: 26 mmol/L (ref 22–32)
Calcium: 9 mg/dL (ref 8.9–10.3)
Chloride: 97 mmol/L — ABNORMAL LOW (ref 98–111)
Creatinine: 0.66 mg/dL (ref 0.44–1.00)
GFR, Estimated: >60 mL/min (ref >60)
Glucose, Bld: 95 mg/dL (ref 70–99)
Potassium: 4.2 mmol/L (ref 3.5–5.1)
Sodium: 131 mmol/L — ABNORMAL LOW (ref 135–145)
Total Bilirubin: 1.3 mg/dL — ABNORMAL HIGH (ref 0.0–1.2)
Total Protein: 7 g/dL (ref 6.5–8.1)

## 2024-05-21 LAB — CBC WITH DIFFERENTIAL (CANCER CENTER ONLY)
Abs Immature Granulocytes: 0.03 K/uL (ref 0.00–0.07)
Basophils Absolute: 0 K/uL (ref 0.0–0.1)
Basophils Relative: 1 %
Eosinophils Absolute: 0.2 K/uL (ref 0.0–0.5)
Eosinophils Relative: 2 %
HCT: 35.5 % — ABNORMAL LOW (ref 36.0–46.0)
Hemoglobin: 11.8 g/dL — ABNORMAL LOW (ref 12.0–15.0)
Immature Granulocytes: 1 %
Lymphocytes Relative: 21 %
Lymphs Abs: 1.3 K/uL (ref 0.7–4.0)
MCH: 30.8 pg (ref 26.0–34.0)
MCHC: 33.2 g/dL (ref 30.0–36.0)
MCV: 92.7 fL (ref 80.0–100.0)
Monocytes Absolute: 0.7 K/uL (ref 0.1–1.0)
Monocytes Relative: 11 %
Neutro Abs: 4 K/uL (ref 1.7–7.7)
Neutrophils Relative %: 64 %
Platelet Count: 316 K/uL (ref 150–400)
RBC: 3.83 MIL/uL — ABNORMAL LOW (ref 3.87–5.11)
RDW: 13.1 % (ref 11.5–15.5)
WBC Count: 6.2 K/uL (ref 4.0–10.5)
nRBC: 0 % (ref 0.0–0.2)

## 2024-05-21 LAB — IRON AND TIBC
Iron: 97 ug/dL (ref 28–170)
Saturation Ratios: 26 % (ref 10.4–31.8)
TIBC: 374 ug/dL (ref 250–450)
UIBC: 277 ug/dL

## 2024-05-21 LAB — FERRITIN: Ferritin: 21 ng/mL (ref 11–307)

## 2024-05-22 ENCOUNTER — Other Ambulatory Visit

## 2024-05-24 ENCOUNTER — Inpatient Hospital Stay

## 2024-05-24 ENCOUNTER — Inpatient Hospital Stay: Admitting: Oncology

## 2024-05-24 ENCOUNTER — Encounter: Payer: Self-pay | Admitting: Oncology

## 2024-05-24 ENCOUNTER — Other Ambulatory Visit: Payer: Self-pay | Admitting: Family Medicine

## 2024-05-24 VITALS — BP 151/72 | HR 79 | Temp 98.1°F | Resp 18 | Wt 144.2 lb

## 2024-05-24 DIAGNOSIS — M8580 Other specified disorders of bone density and structure, unspecified site: Secondary | ICD-10-CM

## 2024-05-24 DIAGNOSIS — Z1231 Encounter for screening mammogram for malignant neoplasm of breast: Secondary | ICD-10-CM

## 2024-05-24 DIAGNOSIS — G629 Polyneuropathy, unspecified: Secondary | ICD-10-CM

## 2024-05-24 DIAGNOSIS — D509 Iron deficiency anemia, unspecified: Secondary | ICD-10-CM | POA: Diagnosis not present

## 2024-05-24 DIAGNOSIS — D0512 Intraductal carcinoma in situ of left breast: Secondary | ICD-10-CM

## 2024-05-24 DIAGNOSIS — Z170 Estrogen receptor positive status [ER+]: Secondary | ICD-10-CM | POA: Diagnosis not present

## 2024-05-24 DIAGNOSIS — Z79811 Long term (current) use of aromatase inhibitors: Secondary | ICD-10-CM | POA: Diagnosis not present

## 2024-05-24 DIAGNOSIS — I10 Essential (primary) hypertension: Secondary | ICD-10-CM

## 2024-05-24 DIAGNOSIS — M150 Primary generalized (osteo)arthritis: Secondary | ICD-10-CM

## 2024-05-24 MED ORDER — DENOSUMAB 60 MG/ML ~~LOC~~ SOSY
60.0000 mg | PREFILLED_SYRINGE | Freq: Once | SUBCUTANEOUS | Status: AC
  Administered 2024-05-24: 60 mg via SUBCUTANEOUS
  Filled 2024-05-24: qty 1

## 2024-05-24 MED ORDER — EXEMESTANE 25 MG PO TABS
ORAL_TABLET | ORAL | 3 refills | Status: AC
Start: 2024-05-24 — End: ?

## 2024-05-24 NOTE — Assessment & Plan Note (Addendum)
#  Left breast DCIS high grade. pTis, ER positive 2022 Status post left lumpectomy-margin positive status post reexcision achieve negative margin followed by adjuvant breast radiation. Currently on adjuvant endocrine therapy-Aromasin . She tolerates with mild difficulties. Continue Aromasin  plan for total 5 years [till April 2027] Recommend  annual mammogram- she prefers our office to order for her Recent breast biopsy showed fat necrosis.l

## 2024-05-24 NOTE — Assessment & Plan Note (Addendum)
 Lab Results  Component Value Date   HGB 11.8 (L) 05/21/2024   TIBC 374 05/21/2024   IRONPCTSAT 26 05/21/2024   FERRITIN 21 05/21/2024    S/p Venofer  hemoglobin is stable.  No IV Venofer   Recommend oral iron  supplementation once a day. - she previously took Vitron C which causes teeth discoloration. She plans to try other OTC brand

## 2024-05-24 NOTE — Progress Notes (Signed)
 Patient feeling tired but under a lot of home stressors.  Unable to take oral iron  due to causing teeth to turn black

## 2024-05-24 NOTE — Assessment & Plan Note (Signed)
 DEXA 10/28/2020 10-year probability of fracture 12.5%, 03/09/23  DEXA showed osteopenia.  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  Prolia  every 6 months.

## 2024-05-24 NOTE — Assessment & Plan Note (Addendum)
 Off gabapentin .  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

## 2024-05-25 ENCOUNTER — Encounter: Payer: Self-pay | Admitting: Oncology

## 2024-05-25 NOTE — Progress Notes (Signed)
 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 Currently on adjuvant endocrine therapy-Aromasin . She tolerates with mild difficulties. Continue Aromasin  plan for total 5 years [till April 2027] Recommend  annual mammogram- she prefers our office to order for her Recent breast biopsy showed fat necrosis.l   Iron  deficiency anemia Lab Results  Component Value Date   HGB 11.8 (L) 05/21/2024   TIBC 374 05/21/2024   IRONPCTSAT 26 05/21/2024   FERRITIN 21 05/21/2024    S/p Venofer  hemoglobin is stable.  No IV Venofer   Recommend oral iron  supplementation once a day. - she previously took Vitron C which causes teeth discoloration. She plans to try other OTC brand   Neuropathy Off gabapentin .  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    Osteopenia DEXA 10/28/2020 10-year probability of fracture 12.5%, 03/09/23  DEXA showed osteopenia.  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  Prolia  every 6 months.    Orders Placed This Encounter  Procedures   MM 3D SCREENING MAMMOGRAM BILATERAL BREAST    Standing Status:   Future    Expected Date:   09/02/2024    Expiration Date:   05/24/2025    Reason for Exam (SYMPTOM  OR DIAGNOSIS REQUIRED):   Breast cancer    Preferred imaging location?:   [facility]   CBC with Differential (Cancer Center Only)    Standing Status:   Future    Expected Date:   11/21/2024    Expiration Date:   02/19/2025   Iron  and TIBC    Standing Status:   Future    Expected Date:   11/21/2024    Expiration  Date:   02/19/2025   Ferritin    Standing Status:   Future    Expected Date:   11/21/2024    Expiration Date:   02/19/2025   CMP (Cancer Center only)    Standing Status:   Future    Expected Date:   11/21/2024    Expiration Date:   02/19/2025   Follow up in Sept 2025 All questions were answered. The patient knows to call the clinic with any problems, questions or concerns.  Zelphia Cap, MD, PhD [facility] Hematology Oncology 05/24/2024   Orders Placed This Encounter  Procedures   MM 3D SCREENING MAMMOGRAM BILATERAL BREAST    Standing Status:   Future    Expected Date:   09/02/2024    Expiration Date:   05/24/2025    Reason for Exam (SYMPTOM  OR DIAGNOSIS REQUIRED):   Breast cancer    Preferred imaging location?:   [facility]   CBC with Differential (Cancer Center Only)    Standing Status:   Future    Expected Date:   11/21/2024    Expiration Date:   02/19/2025   Iron  and TIBC    Standing Status:   Future    Expected Date:   11/21/2024    Expiration Date:   02/19/2025   Ferritin    Standing Status:   Future    Expected Date:   11/21/2024    Expiration Date:   02/19/2025   CMP (Cancer Center only)  Standing Status:   Future    Expected Date:   11/21/2024    Expiration Date:   02/19/2025   All questions were answered. The patient knows to call the clinic with any problems, questions or concerns.  Zelphia Cap, MD, PhD [facility] Hematology Oncology 05/24/2024    HISTORY OF PRESENTING ILLNESS:  Claire Kane is a  83 y.o.  female with PMH listed below who was referred to me for evaluation of anemia.  Patient reports she was told that she had iron  deficiency 2 years ago and she took iron  supplementation for a while and was told to stop. Recently found to to have worsening of anemia. She had Upper and lower endoscopy in 2017 by Dr.Sankar # Upper endoscopy 12/07/2017 Normal examined duodenum. - Large hiatal hernia. - Erythematous mucosa in the antrum. Biopsied. - The examination was otherwise  normal # Colonoscopy on 12/07/2017  Non-thrombosed external hemorrhoids found on perianal exam. - Diverticulosis in the sigmoid colon and in the descending colon. - The examination was otherwise normal on direct and retroflexion views. - No specimens collected. #  capsule study which did not reveal any active bleeding source.  # Remote history of DCIS right breast in  2002, s/p surgery and RT. Finished 5 years of tamoxifen.   # April - May s/p upper and lower endoscopy, capsule study which did not reveal active bleeding source  Celiac panel positive.  # iron  deficiency anemia.  Previously she has received IV Venofer . Patient has previously underwent extensive gastroenterology work-up including EGD, colonoscopy, capsule study which did not reveal active bleeding source.  #08/18/2020, screening mammogram showed left breast calcification. 09/07/2020, diagnostic left mammogram showed an area of 1.4 cm group of calcifications in the upper outer left breast.  Patient underwent breast biopsy, pathology came back positive for high-grade DCIS, focal necrosis, associated with calcifications.  ER status deferred to existing specimen.  # #10/07/2020, status post left DCIS lumpectomy by Dr. Dessa Pathology showed residual high-grade DCIS with associated calcifications.  Extent of DCIS at least 12 mm, grade 3, central necrosis, anterior and inferior margins were unifocal in positive for DCIS pTis, ER positive.  11/09/2020 reexcision showed no residual DCIS or invasive carcinoma. 01/04/2021, adjuvant breast radiation  Patient reports family history of VTE daughter who was tested positive for mutation. Significant family history of cardiovascular disease, familiar hypercholesterolemia. Parents, sister, brother passed away from heart attack.  Patient declined genetic testing.  01/08/2021, started on Arimidex .  09/01/2021, switched to Aromasin  due to arthralgia.    08/23/22 bilateral diagnostic mammogram -  Now identified near the LEFT lumpectomy site are small round calcifications in a somewhat linear orientation spanning a distance of 1 cm. Tissue sampling is recommended. 09/07/22 left breast stereotactic core needle biopsy-benign mammary parenchyma with fibrocystic and fibroadenomatoid changes and focal usual  ductal hyperplasia. No definite evidence of residual atypical proliferative breast disease.    Interval History 83 year old female with pertinent hematology history listed above reviewed by me today presents to reestablish care for left breast DCIS.   Patient takes Aromasin  25mg  daily.   Patient reports feeling well. Compression neuropathy symptoms, carpal tunnel syndrome.  previously on Vitron C which change color of her teeth. She stopped.      MEDICAL HISTORY:  Past Medical History:  Diagnosis Date   Anemia    fe deficiency now improved at age 26   Anxiety    Arthritis    BRCA 1 and 2 negative 08/04/2015   Breast  cancer, left (HCC) 09/2020   Carpal tunnel syndrome of right wrist 06/06/2023   Cataract    Difficult intubation    Ductal carcinoma in situ (DCIS) of right breast    GERD (gastroesophageal reflux disease)    Heart murmur 2001   Hiatal hernia    High cholesterol    Hypertension    Malignant tumor of breast (HCC) 09/19/2020   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   Personal history of radiation therapy 2002, 2022   BREAST CA   Rosacea    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 complications     SURGICAL HISTORY: Past Surgical History:  Procedure Laterality Date   ABDOMINAL HYSTERECTOMY     BLEPHAROPLASTY     BREAST BIOPSY Left 09/15/2020   affirm bx, x marker, DCIS   BREAST BIOPSY Left 09/07/2022   Left Breast Stereo Bx X clip- path pending   BREAST BIOPSY Left 09/07/2022   MM LT BREAST BX W LOC DEV 1ST LESION IMAGE BX SPEC STEREO GUIDE 09/07/2022 [facility]   BREAST BIOPSY Left 09/21/2023   u/s bx ribbon  clip, path pending   BREAST BIOPSY Left 09/21/2023   US  LT BREAST BX W LOC DEV 1ST LESION IMG BX SPEC US  GUIDE 09/21/2023 [facility]   BREAST CYST ASPIRATION Left 2003   BREAST EXCISIONAL BIOPSY Right 2002   POS   BREAST LUMPECTOMY Right 2002   w/ radiation   BREAST LUMPECTOMY Left 10/07/2020   RESIDUAL HIGH-GRADE DUCTAL CARCINOMA IN SITU (DCIS) DCIS is focally present 1.4 mm to the superior margin.   BREAST LUMPECTOMY Left    re-excision   BREAST LUMPECTOMY WITH NEEDLE LOCALIZATION Left 10/07/2020   Procedure: BREAST LUMPECTOMY WITH NEEDLE LOCALIZATION;  Surgeon: Dessa Reyes ORN, MD;  Location: [facility];  Service: General;  Laterality: Left;   BREAST SURGERY Right 2002   lumpectomy   CATARACT EXTRACTION W/ INTRAOCULAR LENS  IMPLANT, BILATERAL     CHOLECYSTECTOMY     COLONOSCOPY  2013   COLONOSCOPY WITH PROPOFOL  N/A 12/08/2015   Procedure: COLONOSCOPY WITH PROPOFOL ;  Surgeon: Louanne KANDICE Muse, MD;  Location: [facility];  Service: Endoscopy;  Laterality: N/A;   COLONOSCOPY WITH PROPOFOL  N/A 12/06/2017   Procedure: COLONOSCOPY WITH PROPOFOL ;  Surgeon: Therisa Bi, MD;  Location: [facility] ENDOSCOPY;  Service: Gastroenterology;  Laterality: N/A;   COLONOSCOPY WITH PROPOFOL  N/A 12/22/2017   Procedure: COLONOSCOPY WITH PROPOFOL ;  Surgeon: Therisa Bi, MD;  Location: [facility] ENDOSCOPY;  Service: Gastroenterology;  Laterality: N/A;   ESOPHAGOGASTRODUODENOSCOPY (EGD) WITH PROPOFOL  N/A 12/08/2015   Procedure: ESOPHAGOGASTRODUODENOSCOPY (EGD) WITH PROPOFOL ;  Surgeon: Louanne KANDICE Muse, MD;  Location: [facility]COPY;  Service: Endoscopy;  Laterality: N/A;   ESOPHAGOGASTRODUODENOSCOPY (EGD) WITH PROPOFOL  N/A 12/06/2017   Procedure: ESOPHAGOGASTRODUODENOSCOPY (EGD) WITH PROPOFOL ;  Surgeon: Therisa Bi, MD;  Location: [facility] ENDOSCOPY;  Service: Gastroenterology;  Laterality: N/A;   ESOPHAGOGASTRODUODENOSCOPY (EGD) WITH PROPOFOL  N/A 12/22/2017   Procedure: ESOPHAGOGASTRODUODENOSCOPY (EGD)  WITH PROPOFOL ;  Surgeon: Therisa Bi, MD;  Location: [facility] ENDOSCOPY;  Service: Gastroenterology;  Laterality: N/A;   ESOPHAGOGASTRODUODENOSCOPY (EGD) WITH PROPOFOL  N/A 08/30/2023   Procedure: ESOPHAGOGASTRODUODENOSCOPY (EGD) WITH PROPOFOL ;  Surgeon: Therisa Bi, MD;  Location: [facility] ENDOSCOPY;  Service: Gastroenterology;  Laterality: N/A;   EYE SURGERY Right 2014   EYE SURGERY Left 2014   GIVENS CAPSULE STUDY N/A 01/23/2018   Procedure: GIVENS CAPSULE STUDY;  Surgeon: Therisa Bi, MD;  Location: [facility] ENDOSCOPY;  Service: Gastroenterology;  Laterality: N/A;   iron  infusion  had 5 iron  infusions for anemia   post radiation therapy Right    right breast cancer 2002   RE-EXCISION OF BREAST CANCER,SUPERIOR MARGINS Left 11/09/2020   Procedure: RE-EXCISION OF BREAST CANCER,SUPERIOR MARGINS;  Surgeon: Dessa Reyes ORN, MD;  Location: [facility];  Service: General;  Laterality: Left;   Stab pheblectomy   2010   vein closure procedure Bilateral 2008   XI ROBOTIC ASSISTED PARAESOPHAGEAL HERNIA REPAIR N/A 10/10/2023   Procedure: XI ROBOTIC ASSISTED PARAESOPHAGEAL HERNIA REPAIR, RNFA to assist;  Surgeon: Jordis Laneta FALCON, MD;  Location: [facility];  Service: General;  Laterality: N/A;    SOCIAL HISTORY: Social History   Socioeconomic History   Marital status: Married    Spouse name: Ubaldo   Number of children: 2   Years of education: Not on file   Highest education level: Bachelor's degree (e.g., BA, AB, BS)  Occupational History   Occupation: Runner, broadcasting/film/video    Comment: retired  Tobacco Use   Smoking status: Never    Passive exposure: Never   Smokeless tobacco: Never  Vaping Use   Vaping status: Never Used  Substance and Sexual Activity   Alcohol use: No   Drug use: No   Sexual activity: Never  Other Topics Concern   Not on file  Social History Narrative   Patient lives with husband. She feels safe in her home.   Social Drivers of Corporate investment banker Strain: Low Risk  (02/06/2024)    Overall Financial Resource Strain (CARDIA)    Difficulty of Paying Living Expenses: Not hard at all  Food Insecurity: No Food Insecurity (02/06/2024)   Hunger Vital Sign    Worried About Running Out of Food in the Last Year: Never true    Ran Out of Food in the Last Year: Never true  Transportation Needs: No Transportation Needs (02/06/2024)   PRAPARE - Administrator, Civil Service (Medical): No    Lack of Transportation (Non-Medical): No  Physical Activity: Insufficiently Active (02/06/2024)   Exercise Vital Sign    Days of Exercise per Week: 2 days    Minutes of Exercise per Session: 20 min  Stress: No Stress Concern Present (02/06/2024)   [employer] of Occupational Health - Occupational Stress Questionnaire    Feeling of Stress : Only a little  Social Connections: Unknown (02/06/2024)   Social Connection and Isolation Panel    Frequency of Communication with Friends and Family: More than three times a week    Frequency of Social Gatherings with Friends and Family: Once a week    Attends Religious Services: Patient declined    Database administrator or Organizations: No    Attends Banker Meetings: Never    Marital Status: Married  Catering manager Violence: Not At Risk (02/06/2024)   Humiliation, Afraid, Rape, and Kick questionnaire    Fear of Current or Ex-Partner: No    Emotionally Abused: No    Physically Abused: No    Sexually Abused: No    FAMILY HISTORY: Family History  Problem Relation Age of Onset   Heart attack Mother    Heart attack Father    Heart attack Sister    Stroke Sister    Heart attack Brother    Other Brother        cerebral hemorrhage, sepsis   Breast cancer Cousin 50       breast/maternal   Cancer Cousin 60       breast/maternal   Breast  cancer Cousin    Breast cancer Maternal Aunt 66   Breast cancer Other 48       maternal neice    ALLERGIES:  is allergic to zometa  [zoledronic  acid].  MEDICATIONS:  Current  Outpatient Medications  Medication Sig Dispense Refill   amLODipine  (NORVASC ) 5 MG tablet TAKE 1 TABLET EVERY DAY 90 tablet 3   atorvastatin  (LIPITOR) 10 MG tablet TAKE 1 TABLET EVERY DAY 90 tablet 3   denosumab  (PROLIA ) 60 MG/ML SOSY injection Inject 60 mg into the skin every 6 (six) months.     ezetimibe  (ZETIA ) 10 MG tablet TAKE 1 TABLET EVERY DAY 90 tablet 3   Multiple Vitamins-Minerals (MULTIVITAMIN WITH MINERALS) tablet Take 1 tablet by mouth daily.     omeprazole  (PRILOSEC) 20 MG capsule Take 1 capsule (20 mg total) by mouth daily. 90 capsule 3   polyethylene glycol (MIRALAX ) 17 g packet Take 17 g by mouth daily. 30 each 0   simethicone (MYLICON) 80 MG chewable tablet Chew 80 mg by mouth every 6 (six) hours as needed for flatulence.     solifenacin  (VESICARE ) 10 MG tablet Take 1 tablet (10 mg total) by mouth daily. 90 tablet 3   spironolactone  (ALDACTONE ) 50 MG tablet Take 50 mg by mouth daily.     traMADol  (ULTRAM ) 50 MG tablet TAKE 1 TABLET BY MOUTH EVERY 8 HOURS. 90 tablet 0   clotrimazole -betamethasone  (LOTRISONE ) cream Apply 1 Application topically 2 (two) times daily. (Patient not taking: Reported on 05/24/2024) 30 g 6   exemestane  (AROMASIN ) 25 MG tablet TAKE 1 TABLET EVERY DAY AFTER BREAKFAST 90 tablet 3   ferrous sulfate  324 MG TBEC Take 324 mg by mouth. (Patient not taking: Reported on 05/24/2024)     gabapentin  (NEURONTIN ) 100 MG capsule Take 2 capsules (200 mg total) by mouth 3 (three) times daily. (Patient not taking: Reported on 05/24/2024) 180 capsule 0   Glucosamine 750 MG TABS Take 1,500 mg by mouth daily.     Iron -Vitamin C (VITRON-C PO) Take by mouth 2 (two) times a week. (Patient not taking: Reported on 05/24/2024)     No current facility-administered medications for this visit.   Facility-Administered Medications Ordered in Other Visits  Medication Dose Route Frequency Provider Last Rate Last Admin   0.9 %  sodium chloride  infusion   Intravenous Once Babara Call, MD        Review of Systems  Constitutional:  Positive for fatigue. Negative for appetite change, chills and fever.  HENT:   Negative for hearing loss and voice change.   Eyes:  Negative for eye problems.  Respiratory:  Negative for chest tightness and cough.   Cardiovascular:  Negative for chest pain.  Gastrointestinal:  Negative for abdominal pain and blood in stool.  Endocrine: Negative for hot flashes.  Genitourinary:  Negative for difficulty urinating and frequency.   Musculoskeletal:  Negative for arthralgias.  Skin:  Negative for itching and rash.  Neurological:  Negative for extremity weakness.  Hematological:  Negative for adenopathy.  Psychiatric/Behavioral:  Negative for confusion.    Breast exam was performed in seated and lying down position. Patient is status post left lumpectomy with a well-healed surgical scar.   No palpable breast masses bilaterally.  No palpable axillary adenopathy bilaterally.   PHYSICAL EXAMINATION: ECOG PERFORMANCE STATUS: 1 - Symptomatic but completely ambulatory Vitals:   05/24/24 1213  BP: (!) 151/72  Pulse: 79  Resp: 18  Temp: 98.1 F (36.7 C)   Filed Weights  05/24/24 1213  Weight: 144 lb 3.2 oz (65.4 kg)    Physical Exam Constitutional:      General: She is not in acute distress.    Appearance: She is not diaphoretic.     Comments: Obese  HENT:     Head: Normocephalic and atraumatic.  Eyes:     General: No scleral icterus. Cardiovascular:     Rate and Rhythm: Normal rate.  Pulmonary:     Effort: Pulmonary effort is normal. No respiratory distress.     Breath sounds: Normal breath sounds.  Abdominal:     General: Bowel sounds are normal. There is no distension.     Palpations: Abdomen is soft.  Musculoskeletal:        General: Normal range of motion.     Cervical back: Normal range of motion and neck supple.  Lymphadenopathy:     Cervical: No cervical adenopathy.  Skin:    General: Skin is warm and dry.  Neurological:      Mental Status: She is alert and oriented to person, place, and time. Mental status is at baseline.     Cranial Nerves: No cranial nerve deficit.     Motor: No abnormal muscle tone.  Psychiatric:        Mood and Affect: Affect normal.        Cognition and Memory: Memory normal.        Judgment: Judgment normal.      LABORATORY DATA:  I have reviewed the data as listed    Latest Ref Rng & Units 05/21/2024   11:13 AM 12/27/2023    1:05 PM 11/20/2023    1:05 PM  CBC  WBC 4.0 - 10.5 K/uL 6.2  5.9  6.5   Hemoglobin 12.0 - 15.0 g/dL 88.1  88.0  88.4   Hematocrit 36.0 - 46.0 % 35.5  37.3  36.5   Platelets 150 - 400 K/uL 316  354  395       Latest Ref Rng & Units 05/21/2024   11:13 AM 12/27/2023    1:05 PM 11/20/2023    1:05 PM  CMP  Glucose 70 - 99 mg/dL 95  94  95   BUN 8 - 23 mg/dL 16  14  12    Creatinine 0.44 - 1.00 mg/dL 9.33  9.35  9.35   Sodium 135 - 145 mmol/L 131  134  138   Potassium 3.5 - 5.1 mmol/L 4.2  4.2  4.2   Chloride 98 - 111 mmol/L 97  101  102   CO2 22 - 32 mmol/L 26  26  26    Calcium  8.9 - 10.3 mg/dL 9.0  8.7  8.9   Total Protein 6.5 - 8.1 g/dL 7.0  6.6  6.8   Total Bilirubin 0.0 - 1.2 mg/dL 1.3  1.2  1.0   Alkaline Phos 38 - 126 U/L 43  56  63   AST 15 - 41 U/L 17  30  53   ALT 0 - 44 U/L 16  47  47

## 2024-05-28 ENCOUNTER — Other Ambulatory Visit: Payer: Self-pay

## 2024-05-28 DIAGNOSIS — N3281 Overactive bladder: Secondary | ICD-10-CM

## 2024-05-28 MED ORDER — SOLIFENACIN SUCCINATE 10 MG PO TABS: 10.0000 mg | ORAL_TABLET | Freq: Every day | ORAL | 1 refills | Status: DC

## 2024-05-29 ENCOUNTER — Encounter: Payer: Self-pay | Admitting: Surgery

## 2024-05-29 ENCOUNTER — Ambulatory Visit: Admitting: Surgery

## 2024-05-29 VITALS — BP 157/76 | HR 75 | Temp 98.0°F | Ht 62.0 in | Wt 142.0 lb

## 2024-05-29 DIAGNOSIS — K5900 Constipation, unspecified: Secondary | ICD-10-CM | POA: Diagnosis not present

## 2024-05-29 DIAGNOSIS — R110 Nausea: Secondary | ICD-10-CM

## 2024-05-29 DIAGNOSIS — R197 Diarrhea, unspecified: Secondary | ICD-10-CM | POA: Diagnosis not present

## 2024-05-29 DIAGNOSIS — Z09 Encounter for follow-up examination after completed treatment for conditions other than malignant neoplasm: Secondary | ICD-10-CM

## 2024-05-29 DIAGNOSIS — K449 Diaphragmatic hernia without obstruction or gangrene: Secondary | ICD-10-CM | POA: Diagnosis not present

## 2024-05-29 NOTE — Patient Instructions (Signed)
Please call the office if you have any questions or concerns. 

## 2024-05-30 ENCOUNTER — Encounter: Payer: Self-pay | Admitting: Surgery

## 2024-05-30 NOTE — Progress Notes (Signed)
 Outpatient Surgical Follow Up    Claire Kane is an 83 y.o. female.   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 She is doing well considering her advanced age and the magnitude of her surgery. She is tolerating diet. Now on regular diet, no dysphagia, no reflux. She endorses some constipation and diarrhea.  Had a recent episode of diarrhea and some nausea.  Did have a chest x-ray ordered by Dr. Desiderio that I have personally reviewed showing no evidence of acute chest or abdominal abnormalities. Please note that to give it context she had a giant paraesophageal hernia and an episode of gastric outlet obstruction. 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/2023   Cataract    Difficult intubation    Ductal carcinoma in situ (DCIS) of right breast    GERD (gastroesophageal reflux disease)    Heart murmur 2001   Hiatal hernia    High cholesterol    Hypertension    Malignant tumor of breast (HCC) 09/19/2020   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   Personal history of radiation therapy 2002, 2022   BREAST CA   Rosacea    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 complications     Past Surgical History:  Procedure Laterality Date   ABDOMINAL HYSTERECTOMY     BLEPHAROPLASTY     BREAST BIOPSY Left 09/15/2020   affirm bx, x marker, DCIS   BREAST BIOPSY Left 09/07/2022   Left Breast Stereo Bx X clip- path pending   BREAST BIOPSY Left 09/07/2022   MM LT BREAST BX W LOC DEV 1ST LESION IMAGE BX SPEC STEREO GUIDE 09/07/2022 [facility]    BREAST BIOPSY Left 09/21/2023   u/s bx ribbon clip, path pending   BREAST BIOPSY Left 09/21/2023   US  LT BREAST BX W LOC DEV 1ST LESION IMG BX SPEC US  GUIDE 09/21/2023 [facility]   BREAST CYST ASPIRATION Left 2003   BREAST EXCISIONAL BIOPSY Right 2002   POS   BREAST LUMPECTOMY Right 2002   w/ radiation   BREAST LUMPECTOMY Left 10/07/2020   RESIDUAL HIGH-GRADE DUCTAL CARCINOMA IN SITU (DCIS) DCIS is focally present 1.4 mm to the superior margin.   BREAST LUMPECTOMY Left    re-excision   BREAST LUMPECTOMY WITH NEEDLE LOCALIZATION Left 10/07/2020   Procedure: BREAST LUMPECTOMY WITH NEEDLE LOCALIZATION;  Surgeon: Dessa Reyes ORN, MD;  Location: [facility];  Service: General;  Laterality: Left;   BREAST SURGERY Right 2002   lumpectomy   CATARACT EXTRACTION W/ INTRAOCULAR LENS  IMPLANT, BILATERAL     CHOLECYSTECTOMY     COLONOSCOPY  2013   COLONOSCOPY WITH PROPOFOL  N/A 12/08/2015   Procedure: COLONOSCOPY WITH PROPOFOL ;  Surgeon: Louanne KANDICE Muse, MD;  Location: [facility];  Service: Endoscopy;  Laterality: N/A;   COLONOSCOPY WITH PROPOFOL  N/A 12/06/2017   Procedure: COLONOSCOPY WITH PROPOFOL ;  Surgeon: Therisa Bi, MD;  Location: [facility] ENDOSCOPY;  Service: Gastroenterology;  Laterality: N/A;   COLONOSCOPY WITH PROPOFOL  N/A 12/22/2017   Procedure: COLONOSCOPY  WITH PROPOFOL ;  Surgeon: Therisa Bi, MD;  Location: [facility] ENDOSCOPY;  Service: Gastroenterology;  Laterality: N/A;   ESOPHAGOGASTRODUODENOSCOPY (EGD) WITH PROPOFOL  N/A 12/08/2015   Procedure: ESOPHAGOGASTRODUODENOSCOPY (EGD) WITH PROPOFOL ;  Surgeon: Louanne KANDICE Muse, MD;  Location: [facility];  Service: Endoscopy;  Laterality: N/A;   ESOPHAGOGASTRODUODENOSCOPY (EGD) WITH PROPOFOL  N/A 12/06/2017   Procedure: ESOPHAGOGASTRODUODENOSCOPY (EGD) WITH PROPOFOL ;  Surgeon: Therisa Bi, MD;  Location: [facility] ENDOSCOPY;  Service: Gastroenterology;  Laterality: N/A;   ESOPHAGOGASTRODUODENOSCOPY (EGD) WITH PROPOFOL  N/A 12/22/2017    Procedure: ESOPHAGOGASTRODUODENOSCOPY (EGD) WITH PROPOFOL ;  Surgeon: Therisa Bi, MD;  Location: [facility] ENDOSCOPY;  Service: Gastroenterology;  Laterality: N/A;   ESOPHAGOGASTRODUODENOSCOPY (EGD) WITH PROPOFOL  N/A 08/30/2023   Procedure: ESOPHAGOGASTRODUODENOSCOPY (EGD) WITH PROPOFOL ;  Surgeon: Therisa Bi, MD;  Location: [facility] ENDOSCOPY;  Service: Gastroenterology;  Laterality: N/A;   EYE SURGERY Right 2014   EYE SURGERY Left 2014   GIVENS CAPSULE STUDY N/A 01/23/2018   Procedure: GIVENS CAPSULE STUDY;  Surgeon: Therisa Bi, MD;  Location: [facility] ENDOSCOPY;  Service: Gastroenterology;  Laterality: N/A;   iron  infusion     had 5 iron  infusions for anemia   post radiation therapy Right    right breast cancer 2002   RE-EXCISION OF BREAST CANCER,SUPERIOR MARGINS Left 11/09/2020   Procedure: RE-EXCISION OF BREAST CANCER,SUPERIOR MARGINS;  Surgeon: Dessa Reyes ORN, MD;  Location: [facility];  Service: General;  Laterality: Left;   Stab pheblectomy   2010   vein closure procedure Bilateral 2008   XI ROBOTIC ASSISTED PARAESOPHAGEAL HERNIA REPAIR N/A 10/10/2023   Procedure: XI ROBOTIC ASSISTED PARAESOPHAGEAL HERNIA REPAIR, RNFA to assist;  Surgeon: Jordis Laneta FALCON, MD;  Location: [facility];  Service: General;  Laterality: N/A;    Family History  Problem Relation Age of Onset   Heart attack Mother    Heart attack Father    Heart attack Sister    Stroke Sister    Heart attack Brother    Other Brother        cerebral hemorrhage, sepsis   Breast cancer Cousin 50       breast/maternal   Cancer Cousin 60       breast/maternal   Breast cancer Cousin    Breast cancer Maternal Aunt 15   Breast cancer Other 48       maternal neice    Social History:  reports that she has never smoked. She has never been exposed to tobacco smoke. She has never used smokeless tobacco. She reports that she does not drink alcohol and does not use drugs.  Allergies:  Allergies  Allergen Reactions   Zometa  [Zoledronic   Acid]     Severe back pain    Medications reviewed.    ROS Full ROS performed and is otherwise negative other than what is stated in HPI   BP (!) 157/76   Pulse 75   Temp 98 F (36.7 C) (Oral)   Ht 5' 2 (1.575 m)   Wt 142 lb (64.4 kg)   SpO2 99%   BMI 25.97 kg/m   Physical Exam Vitals and nursing note reviewed. Exam conducted with a chaperone present.  Constitutional:      Appearance: Normal appearance.  Cardiovascular:     Rate and Rhythm: Normal rate.  Pulmonary:     Effort: Pulmonary effort is normal. No respiratory distress.     Breath sounds: Normal breath sounds. No wheezing.  Abdominal:     General: Abdomen is flat. There is no distension.     Palpations: Abdomen  is soft. There is no mass.     Tenderness: There is no abdominal tenderness.     Hernia: No hernia is present.     Comments: Incisions completely healed without evidence of infection no hernia no rebound and no peritonitis  Musculoskeletal:     Cervical back: Normal range of motion and neck supple. No rigidity.  Skin:    General: Skin is warm.     Capillary Refill: Capillary refill takes less than 2 seconds.  Neurological:     General: No focal deficit present.     Mental Status: She is alert and oriented to person, place, and time.  Psychiatric:        Mood and Affect: Mood normal.        Behavior: Behavior normal.        Thought Content: Thought content normal.        Judgment: Judgment normal.       Assessment/Plan: 83 year old status post repair of giant paraesophageal hernia.  She does have functional GI issues.  I am not surprised about this as there was significant anatomical distortion of the foregut.  I do think that may need GI consultation.  Encouraged her not to take laxative as she was doing before.  Will be happy to see her in a few months.  Was also to the diarrhea might be related to a stress I personally spent a total of 30 minutes in the care of the patient today including  performing a medically appropriate exam/evaluation, counseling and educating, placing orders, referring and communicating with other health care professionals, documenting clinical information in the EHR, independently interpreting and reviewing images studies and coordinating care.   Laneta Luna, MD [facility] General Surgeon

## 2024-06-11 ENCOUNTER — Other Ambulatory Visit

## 2024-06-12 ENCOUNTER — Ambulatory Visit

## 2024-06-12 ENCOUNTER — Ambulatory Visit: Admitting: Oncology

## 2024-06-20 ENCOUNTER — Encounter: Payer: Self-pay | Admitting: Family Medicine

## 2024-06-20 ENCOUNTER — Ambulatory Visit: Admitting: Family Medicine

## 2024-06-20 VITALS — BP 129/55 | HR 64 | Temp 98.2°F | Ht 62.0 in | Wt 144.9 lb

## 2024-06-20 DIAGNOSIS — K219 Gastro-esophageal reflux disease without esophagitis: Secondary | ICD-10-CM | POA: Diagnosis not present

## 2024-06-20 DIAGNOSIS — D508 Other iron deficiency anemias: Secondary | ICD-10-CM

## 2024-06-20 DIAGNOSIS — G620 Drug-induced polyneuropathy: Secondary | ICD-10-CM

## 2024-06-20 DIAGNOSIS — G629 Polyneuropathy, unspecified: Secondary | ICD-10-CM

## 2024-06-20 DIAGNOSIS — N3281 Overactive bladder: Secondary | ICD-10-CM

## 2024-06-20 DIAGNOSIS — M8589 Other specified disorders of bone density and structure, multiple sites: Secondary | ICD-10-CM

## 2024-06-20 DIAGNOSIS — I10 Essential (primary) hypertension: Secondary | ICD-10-CM

## 2024-06-20 DIAGNOSIS — E785 Hyperlipidemia, unspecified: Secondary | ICD-10-CM | POA: Diagnosis not present

## 2024-06-20 DIAGNOSIS — Z853 Personal history of malignant neoplasm of breast: Secondary | ICD-10-CM | POA: Diagnosis not present

## 2024-06-20 MED ORDER — GABAPENTIN 100 MG PO CAPS: 200.0000 mg | ORAL_CAPSULE | Freq: Three times a day (TID) | ORAL | Status: DC

## 2024-06-20 MED ORDER — GEMTESA 75 MG PO TABS: 75.0000 mg | ORAL_TABLET | Freq: Every day | ORAL | 4 refills | Status: AC

## 2024-06-20 NOTE — Assessment & Plan Note (Signed)
 Overactive bladder with urge incontinence Chronic  Overactive bladder symptoms persist with nocturia and urgency. Insurance advised discontinuation of VESIcare  due to fall risk. - Discontinue VESIcare . - Start Gemtesa 75 mg once daily. - Schedule follow-up in February to assess response to Gemtesa.

## 2024-06-20 NOTE — Assessment & Plan Note (Signed)
 Hx of Breast cancer Breast cancer managed with Aromasin . - Continue Aromasin  25 mg daily. - f/u with oncology as scheduled

## 2024-06-20 NOTE — Assessment & Plan Note (Signed)
 Hyperlipidemia Stopped atorvastatin  and Zetia  to evaluate cholesterol levels. Awaiting updated lipid panel results. - Order updated lipid panel. - Evaluate lipid panel results to determine if atorvastatin  and Zetia  should be resumed.

## 2024-06-20 NOTE — Assessment & Plan Note (Signed)
 Iron  deficiency anemia Iron  deficiency anemia managed with iron  supplementation. - Continue iron  324 mg daily. - f/u with heme as scheduled

## 2024-06-20 NOTE — Assessment & Plan Note (Signed)
 Osteopenia based on 2024 DEXA management with Prolia  injections is ongoing. - Continue Prolia  60 mg injections every six months

## 2024-06-20 NOTE — Assessment & Plan Note (Signed)
 Gastroesophageal reflux disease (GERD) Chronic condition  GERD is managed with omeprazole . - Continue omeprazole  20 mg daily.

## 2024-06-20 NOTE — Patient Instructions (Signed)
 To keep you healthy, please keep in mind the following health maintenance items that you are due for:   Health Maintenance Due  Topic Date Due   Zoster Vaccines- Shingrix (1 of 2) 05/13/1960     Best Wishes,   Dr. Lang

## 2024-06-20 NOTE — Progress Notes (Signed)
 Established patient visit   Patient: Claire Kane   DOB: Jul 27, 1941   83 y.o. Female  MRN: [id number] Visit Date: 06/20/2024  Today's healthcare provider: Rockie Agent, MD   Chief Complaint  Patient presents with   Medical Management of Chronic Issues   Hypertension    Patient presents for follow up of htn.    Subjective     HPI     Hypertension    Additional comments: Patient presents for follow up of htn.       Last edited by Cherry Chiquita HERO, CMA on 06/20/2024  9:35 AM.       Discussed the use of AI scribe software for clinical note transcription with the patient, who gave verbal consent to proceed.  History of Present Illness MALEIGHA Kane is an 83 year old female with chronic hypertension who presents for a follow-up visit.  She takes spironolactone  50 mg daily for chronic hypertension.  She has a history of hyperlipidemia and was previously on atorvastatin  10 mg and Zetia  10 mg daily. She discontinued both medications to evaluate if her cholesterol levels remain stable without them and is due for an updated lipid panel.  She experiences overactive bladder symptoms, particularly nocturnal urgency. She was previously on VESIcare , which provided partial relief, but it was discontinued due to safety concerns related to her age and risk of falls.  She has numbness and tingling in her hands and feet, consistent with neuropathy. Gabapentin  200 mg daily helps manage these symptoms.  Her GERD is managed with Prilosec 20 mg daily, and she receives Prolia  60 mg injections every six months for osteoporosis. She also manages iron  deficiency anemia with iron  324 mg daily.  She has a history of breast cancer and is currently on Aromasin  25 mg. She underwent hernia repair surgery in May 2025 after an emergency hospitalization for severe vomiting and dehydration on Christmas Day 2024. Post-surgery, she experiences episodes of sudden bowel urgency without warning,  which she attributes to stress and aging. She has a family history of IBS.  Her husband has vascular dementia and was recently hospitalized for pneumonia and congestive heart failure. He is now home but requires a sitter due to significant memory loss and inability to perform daily activities independently, which has been a source of stress for her.     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/2023   Cataract    Difficult intubation    Ductal carcinoma in situ (DCIS) of right breast    GERD (gastroesophageal reflux disease)    Heart murmur 2001   Hiatal hernia    High cholesterol    Hypertension    Malignant tumor of breast (HCC) 09/19/2020   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   Personal history of radiation therapy 2002, 2022   BREAST CA   Rosacea    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 complications     Medications: Outpatient Medications Prior to Visit  Medication Sig   amLODipine  (NORVASC ) 5 MG tablet TAKE 1 TABLET EVERY DAY   denosumab  (PROLIA ) 60 MG/ML SOSY injection Inject 60 mg into the skin every 6 (six) months.   exemestane  (AROMASIN ) 25 MG tablet TAKE 1  TABLET EVERY DAY AFTER BREAKFAST   ferrous sulfate  324 MG TBEC Take 324 mg by mouth daily.   Glucosamine 750 MG TABS Take 1,500 mg by mouth daily.   Multiple Vitamins-Minerals (MULTIVITAMIN WITH MINERALS) tablet Take 1 tablet by mouth daily.   omeprazole  (PRILOSEC) 20 MG capsule Take 1 capsule (20 mg total) by mouth daily.   polyethylene glycol (MIRALAX ) 17 g packet Take 17 g by mouth daily.   simethicone (MYLICON) 80 MG chewable tablet Chew 80 mg by mouth every 6 (six) hours as needed for flatulence.   spironolactone  (ALDACTONE ) 50 MG tablet Take 50 mg by mouth daily.   traMADol  (ULTRAM )  50 MG tablet Take 1 tablet (50 mg total) by mouth every 12 (twelve) hours as needed.   [DISCONTINUED] gabapentin  (NEURONTIN ) 100 MG capsule Take 2 capsules (200 mg total) by mouth 3 (three) times daily.   [DISCONTINUED] solifenacin  (VESICARE ) 10 MG tablet Take 1 tablet (10 mg total) by mouth daily.   atorvastatin  (LIPITOR) 10 MG tablet TAKE 1 TABLET EVERY DAY (Patient not taking: Reported on 06/20/2024)   ezetimibe  (ZETIA ) 10 MG tablet TAKE 1 TABLET EVERY DAY (Patient not taking: Reported on 06/20/2024)   Facility-Administered Medications Prior to Visit  Medication Dose Route Frequency Provider   0.9 %  sodium chloride  infusion   Intravenous Once Babara Call, MD    Review of Systems  Last metabolic panel Lab Results  Component Value Date   GLUCOSE 95 05/21/2024   NA 131 (L) 05/21/2024   K 4.2 05/21/2024   CL 97 (L) 05/21/2024   CO2 26 05/21/2024   BUN 16 05/21/2024   CREATININE 0.66 05/21/2024   GFRNONAA >60 05/21/2024   CALCIUM  9.0 05/21/2024   PHOS 3.9 12/13/2016   PROT 7.0 05/21/2024   ALBUMIN 4.2 05/21/2024   LABGLOB 2.6 07/26/2023   AGRATIO 2.1 09/01/2022   BILITOT 1.3 (H) 05/21/2024   ALKPHOS 43 05/21/2024   AST 17 05/21/2024   ALT 16 05/21/2024   ANIONGAP 8 05/21/2024   Last lipids Lab Results  Component Value Date   CHOL 140 03/08/2023   HDL 56 03/08/2023   LDLCALC 68 03/08/2023   TRIG 82 03/08/2023   CHOLHDL 2.5 03/08/2023        Objective    BP (!) 129/55 (BP Location: Right Arm, Patient Position: Sitting, Cuff Size: Normal)   Pulse 64   Temp 98.2 F (36.8 C) (Oral)   Ht 5' 2 (1.575 m)   Wt 144 lb 14.4 oz (65.7 kg)   SpO2 100%   BMI 26.50 kg/m   BP Readings from Last 3 Encounters:  06/20/24 (!) 129/55  05/29/24 (!) 157/76  05/24/24 (!) 151/72   Wt Readings from Last 3 Encounters:  06/20/24 144 lb 14.4 oz (65.7 kg)  05/29/24 142 lb (64.4 kg)  05/24/24 144 lb 3.2 oz (65.4 kg)        Physical Exam Vitals reviewed.  Constitutional:       General: She is not in acute distress.    Appearance: Normal appearance. She is not ill-appearing.  Cardiovascular:     Rate and Rhythm: Normal rate and regular rhythm.  Pulmonary:     Effort: Pulmonary effort is normal. No respiratory distress.     Breath sounds: No wheezing, rhonchi or rales.  Neurological:     Mental Status: She is alert and oriented to person, place, and time.  Psychiatric:        Mood and Affect: Mood normal.  Speech: Speech normal.        Behavior: Behavior normal. Behavior is cooperative.       No results found for any visits on 06/20/24.  Assessment & Plan     Problem List Items Addressed This Visit     Acid reflux   Gastroesophageal reflux disease (GERD) Chronic condition  GERD is managed with omeprazole . - Continue omeprazole  20 mg daily.      Chemotherapy-induced neuropathy   Relevant Medications   gabapentin  (NEURONTIN ) 100 MG capsule   Dyslipidemia   Hyperlipidemia Stopped atorvastatin  and Zetia  to evaluate cholesterol levels. Awaiting updated lipid panel results. - Order updated lipid panel. - Evaluate lipid panel results to determine if atorvastatin  and Zetia  should be resumed.      Relevant Orders   Lipid panel   History of breast cancer, DCIS right breast, 2002   Hx of Breast cancer Breast cancer managed with Aromasin . - Continue Aromasin  25 mg daily. - f/u with oncology as scheduled       HTN (hypertension) - Primary    Chronic hypertension Hypertension is well controlled with current medication regimen. - Continue spironolactone  50 mg daily. - Continue losartan 5 mg daily.      Iron  deficiency anemia (Chronic)   Iron  deficiency anemia Iron  deficiency anemia managed with iron  supplementation. - Continue iron  324 mg daily. - f/u with heme as scheduled       Relevant Medications   ferrous sulfate  324 MG TBEC   Neuropathy (Chronic)   Chronic, improved  Peripheral neuropathy with numbness and tingling in hands  and feet. Gabapentin  provides symptom relief. - Continue gabapentin  200 mg daily as needed.      OAB (overactive bladder)   Overactive bladder with urge incontinence Chronic  Overactive bladder symptoms persist with nocturia and urgency. Insurance advised discontinuation of VESIcare  due to fall risk. - Discontinue VESIcare . - Start Gemtesa 75 mg once daily. - Schedule follow-up in February to assess response to Gemtesa.      Relevant Medications   Vibegron (GEMTESA) 75 MG TABS   Osteopenia (Chronic)   Osteopenia based on 2024 DEXA management with Prolia  injections is ongoing. - Continue Prolia  60 mg injections every six months        Assessment & Plan   Bowel urgency after hiatal hernia repair Bowel urgency persists post-hiatal hernia repair. Possible stress and age-related muscle weakening. Family history of IBS considered. - Monitor symptoms and dietary fiber intake.         Return in about 4 months (around 10/21/2024) for  Chronic F/U, OAB, Cholesterol, Neuropathy.         Rockie Agent, MD  [facility] [phone] (phone) [phone] (fax)  [facility]

## 2024-06-20 NOTE — Assessment & Plan Note (Signed)
 Chronic, improved  Peripheral neuropathy with numbness and tingling in hands and feet. Gabapentin  provides symptom relief. - Continue gabapentin  200 mg daily as needed.

## 2024-06-20 NOTE — Assessment & Plan Note (Signed)
  Chronic hypertension Hypertension is well controlled with current medication regimen. - Continue spironolactone  50 mg daily. - Continue losartan 5 mg daily.

## 2024-06-21 LAB — LIPID PANEL
Chol/HDL Ratio: 3.5 ratio (ref 0.0–4.4)
Cholesterol, Total: 213 mg/dL — ABNORMAL HIGH (ref 100–199)
HDL: 61 mg/dL (ref >39)
LDL Chol Calc (NIH): 137 mg/dL — ABNORMAL HIGH (ref 0–99)
Triglycerides: 83 mg/dL (ref 0–149)
VLDL Cholesterol Cal: 15 mg/dL (ref 5–40)

## 2024-06-28 ENCOUNTER — Ambulatory Visit: Payer: Self-pay | Admitting: Family Medicine

## 2024-07-30 ENCOUNTER — Ambulatory Visit: Admitting: Obstetrics & Gynecology

## 2024-08-05 ENCOUNTER — Ambulatory Visit: Admitting: Surgery

## 2024-08-12 ENCOUNTER — Other Ambulatory Visit: Payer: Self-pay | Admitting: Family Medicine

## 2024-08-12 DIAGNOSIS — G620 Drug-induced polyneuropathy: Secondary | ICD-10-CM

## 2024-09-06 ENCOUNTER — Other Ambulatory Visit: Payer: Self-pay | Admitting: Family Medicine

## 2024-09-06 DIAGNOSIS — G620 Drug-induced polyneuropathy: Secondary | ICD-10-CM

## 2024-09-06 MED ORDER — GABAPENTIN 100 MG PO CAPS: 100.0000 mg | ORAL_CAPSULE | Freq: Three times a day (TID) | ORAL | 1 refills | Status: AC

## 2024-09-06 NOTE — Telephone Encounter (Signed)
 [facility] faxed refill request for the following medications:  gabapentin  (NEURONTIN ) 100 MG capsule     Please advise.

## 2024-09-06 NOTE — Telephone Encounter (Signed)
 Requesting: gabapentin  (NEURONTIN ) 100 MG capsule  Last Visit: 06/20/2024 Next Visit: 10/21/2024 Last Refill: 08/13/2024 #[id number] x 1rf  Please Advise

## 2024-09-24 ENCOUNTER — Ambulatory Visit
Admission: RE | Admit: 2024-09-24 | Discharge: 2024-09-24 | Disposition: A | Discharge status: Home / Self Care | Source: Ambulatory Visit | Attending: Oncology | Admitting: Oncology

## 2024-09-24 DIAGNOSIS — Z1231 Encounter for screening mammogram for malignant neoplasm of breast: Secondary | ICD-10-CM | POA: Insufficient documentation

## 2024-10-21 ENCOUNTER — Ambulatory Visit: Admitting: Family Medicine

## 2024-11-19 ENCOUNTER — Other Ambulatory Visit

## 2024-11-21 ENCOUNTER — Ambulatory Visit

## 2024-11-21 ENCOUNTER — Ambulatory Visit: Admitting: Oncology

## 2025-01-13 ENCOUNTER — Ambulatory Visit: Admitting: Family Medicine

## 2025-02-12 ENCOUNTER — Ambulatory Visit
